# Patient Record
Sex: Female | Born: 1950 | Race: Black or African American | Hispanic: No | Marital: Single | Smoking: Former smoker
Health system: Southern US, Community
[De-identification: ages and names within clinical notes are randomized; demographics above are authoritative.]

## PROBLEM LIST (undated history)

## (undated) DIAGNOSIS — H35039 Hypertensive retinopathy, unspecified eye: Secondary | ICD-10-CM

## (undated) DIAGNOSIS — I10 Essential (primary) hypertension: Secondary | ICD-10-CM

## (undated) DIAGNOSIS — F1291 Cannabis use, unspecified, in remission: Secondary | ICD-10-CM

## (undated) DIAGNOSIS — I5189 Other ill-defined heart diseases: Secondary | ICD-10-CM

## (undated) DIAGNOSIS — M1990 Unspecified osteoarthritis, unspecified site: Secondary | ICD-10-CM

## (undated) DIAGNOSIS — K760 Fatty (change of) liver, not elsewhere classified: Secondary | ICD-10-CM

## (undated) DIAGNOSIS — I6523 Occlusion and stenosis of bilateral carotid arteries: Secondary | ICD-10-CM

## (undated) DIAGNOSIS — I2721 Secondary pulmonary arterial hypertension: Secondary | ICD-10-CM

## (undated) DIAGNOSIS — N200 Calculus of kidney: Secondary | ICD-10-CM

## (undated) DIAGNOSIS — I38 Endocarditis, valve unspecified: Secondary | ICD-10-CM

## (undated) DIAGNOSIS — K8020 Calculus of gallbladder without cholecystitis without obstruction: Secondary | ICD-10-CM

## (undated) DIAGNOSIS — H8120 Vestibular neuronitis, unspecified ear: Secondary | ICD-10-CM

## (undated) DIAGNOSIS — Z7901 Long term (current) use of anticoagulants: Secondary | ICD-10-CM

## (undated) DIAGNOSIS — N3289 Other specified disorders of bladder: Secondary | ICD-10-CM

## (undated) DIAGNOSIS — D509 Iron deficiency anemia, unspecified: Secondary | ICD-10-CM

## (undated) DIAGNOSIS — K759 Inflammatory liver disease, unspecified: Secondary | ICD-10-CM

## (undated) DIAGNOSIS — K8050 Calculus of bile duct without cholangitis or cholecystitis without obstruction: Secondary | ICD-10-CM

## (undated) DIAGNOSIS — I214 Non-ST elevation (NSTEMI) myocardial infarction: Secondary | ICD-10-CM

## (undated) DIAGNOSIS — K7460 Unspecified cirrhosis of liver: Secondary | ICD-10-CM

## (undated) DIAGNOSIS — I700 Atherosclerosis of aorta: Secondary | ICD-10-CM

## (undated) DIAGNOSIS — I050 Rheumatic mitral stenosis: Secondary | ICD-10-CM

## (undated) DIAGNOSIS — I4891 Unspecified atrial fibrillation: Secondary | ICD-10-CM

## (undated) DIAGNOSIS — D259 Leiomyoma of uterus, unspecified: Secondary | ICD-10-CM

## (undated) HISTORY — PX: BREAST CYST ASPIRATION: SHX578

---
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
## 2008-11-26 ENCOUNTER — Ambulatory Visit: Payer: Self-pay

## 2009-01-28 ENCOUNTER — Ambulatory Visit: Payer: Self-pay

## 2009-10-13 ENCOUNTER — Encounter: Payer: Self-pay | Admitting: Family Medicine

## 2009-11-07 ENCOUNTER — Encounter: Payer: Self-pay | Admitting: Family Medicine

## 2010-04-14 ENCOUNTER — Ambulatory Visit: Payer: Self-pay | Admitting: Family Medicine

## 2010-06-09 ENCOUNTER — Ambulatory Visit: Payer: Self-pay | Admitting: Emergency Medicine

## 2010-08-09 HISTORY — PX: COLONOSCOPY: SHX174

## 2010-08-27 ENCOUNTER — Emergency Department: Payer: Self-pay | Admitting: Nurse Practitioner

## 2010-09-11 DIAGNOSIS — H4010X0 Unspecified open-angle glaucoma, stage unspecified: Secondary | ICD-10-CM | POA: Insufficient documentation

## 2010-09-11 HISTORY — DX: Unspecified open-angle glaucoma, stage unspecified: H40.10X0

## 2015-07-30 ENCOUNTER — Other Ambulatory Visit: Payer: Self-pay | Admitting: Family Medicine

## 2015-07-30 DIAGNOSIS — Z1239 Encounter for other screening for malignant neoplasm of breast: Secondary | ICD-10-CM

## 2015-09-10 ENCOUNTER — Other Ambulatory Visit: Payer: Self-pay | Admitting: Family Medicine

## 2015-09-10 ENCOUNTER — Ambulatory Visit
Admission: RE | Admit: 2015-09-10 | Discharge: 2015-09-10 | Disposition: A | Discharge status: Home / Self Care | Payer: Medicare Other | Source: Ambulatory Visit | Attending: Family Medicine | Admitting: Family Medicine

## 2015-09-10 DIAGNOSIS — Z1231 Encounter for screening mammogram for malignant neoplasm of breast: Secondary | ICD-10-CM

## 2015-09-10 DIAGNOSIS — Z1239 Encounter for other screening for malignant neoplasm of breast: Secondary | ICD-10-CM

## 2016-07-26 ENCOUNTER — Encounter: Payer: Self-pay | Admitting: Emergency Medicine

## 2016-07-26 ENCOUNTER — Inpatient Hospital Stay
Admission: EM | Admit: 2016-07-26 | Discharge: 2016-07-27 | DRG: 281 | Disposition: A | Discharge status: Home / Self Care | Payer: Medicare Other | Attending: Internal Medicine | Admitting: Internal Medicine

## 2016-07-26 ENCOUNTER — Emergency Department: Payer: Medicare Other

## 2016-07-26 DIAGNOSIS — Z87891 Personal history of nicotine dependence: Secondary | ICD-10-CM | POA: Diagnosis not present

## 2016-07-26 DIAGNOSIS — Z6841 Body Mass Index (BMI) 40.0 and over, adult: Secondary | ICD-10-CM | POA: Diagnosis not present

## 2016-07-26 DIAGNOSIS — E669 Obesity, unspecified: Secondary | ICD-10-CM | POA: Diagnosis present

## 2016-07-26 DIAGNOSIS — I25110 Atherosclerotic heart disease of native coronary artery with unstable angina pectoris: Secondary | ICD-10-CM | POA: Diagnosis not present

## 2016-07-26 DIAGNOSIS — I214 Non-ST elevation (NSTEMI) myocardial infarction: Secondary | ICD-10-CM | POA: Diagnosis not present

## 2016-07-26 DIAGNOSIS — R7989 Other specified abnormal findings of blood chemistry: Secondary | ICD-10-CM

## 2016-07-26 DIAGNOSIS — Z79899 Other long term (current) drug therapy: Secondary | ICD-10-CM

## 2016-07-26 DIAGNOSIS — R079 Chest pain, unspecified: Secondary | ICD-10-CM

## 2016-07-26 DIAGNOSIS — I10 Essential (primary) hypertension: Secondary | ICD-10-CM | POA: Diagnosis present

## 2016-07-26 DIAGNOSIS — I200 Unstable angina: Secondary | ICD-10-CM | POA: Diagnosis present

## 2016-07-26 DIAGNOSIS — R778 Other specified abnormalities of plasma proteins: Secondary | ICD-10-CM

## 2016-07-26 HISTORY — DX: Non-ST elevation (NSTEMI) myocardial infarction: I21.4

## 2016-07-26 HISTORY — DX: Essential (primary) hypertension: I10

## 2016-07-26 LAB — COMPREHENSIVE METABOLIC PANEL
ALT: 38 U/L (ref 14–54)
ANION GAP: 6 (ref 5–15)
AST: 61 U/L — AB (ref 15–41)
Albumin: 2.8 g/dL — ABNORMAL LOW (ref 3.5–5.0)
Alkaline Phosphatase: 72 U/L (ref 38–126)
BUN: 9 mg/dL (ref 6–20)
CHLORIDE: 108 mmol/L (ref 101–111)
CO2: 24 mmol/L (ref 22–32)
Calcium: 8.6 mg/dL — ABNORMAL LOW (ref 8.9–10.3)
Creatinine, Ser: 0.67 mg/dL (ref 0.44–1.00)
Glucose, Bld: 110 mg/dL — ABNORMAL HIGH (ref 65–99)
POTASSIUM: 3.5 mmol/L (ref 3.5–5.1)
Sodium: 138 mmol/L (ref 135–145)
TOTAL PROTEIN: 7.2 g/dL (ref 6.5–8.1)
Total Bilirubin: 0.9 mg/dL (ref 0.3–1.2)

## 2016-07-26 LAB — CBC
HEMATOCRIT: 37.1 % (ref 35.0–47.0)
HEMOGLOBIN: 13.3 g/dL (ref 12.0–16.0)
MCH: 35.8 pg — ABNORMAL HIGH (ref 26.0–34.0)
MCHC: 35.9 g/dL (ref 32.0–36.0)
MCV: 99.7 fL (ref 80.0–100.0)
Platelets: 194 10*3/uL (ref 150–440)
RBC: 3.72 MIL/uL — ABNORMAL LOW (ref 3.80–5.20)
RDW: 13.4 % (ref 11.5–14.5)
WBC: 3.7 10*3/uL (ref 3.6–11.0)

## 2016-07-26 LAB — PROTIME-INR
INR: 1.28
PROTHROMBIN TIME: 16.1 s — AB (ref 11.4–15.2)

## 2016-07-26 LAB — HEPARIN LEVEL (UNFRACTIONATED): HEPARIN UNFRACTIONATED: 0.21 [IU]/mL — AB (ref 0.30–0.70)

## 2016-07-26 LAB — TROPONIN I: TROPONIN I: 0.06 ng/mL — AB (ref <0.03)

## 2016-07-26 LAB — APTT: APTT: 40 s — AB (ref 24–36)

## 2016-07-26 MED ORDER — CLOPIDOGREL BISULFATE 75 MG PO TABS
75.0000 mg | ORAL_TABLET | Freq: Every day | ORAL | Status: DC
  Administered 2016-07-27: 75 mg via ORAL
  Filled 2016-07-26: qty 1

## 2016-07-26 MED ORDER — SODIUM CHLORIDE 0.9 % IV SOLN
INTRAVENOUS | Status: DC
  Administered 2016-07-26 – 2016-07-27 (×2): via INTRAVENOUS

## 2016-07-26 MED ORDER — AMLODIPINE BESYLATE 5 MG PO TABS
10.0000 mg | ORAL_TABLET | Freq: Every day | ORAL | Status: DC
  Administered 2016-07-26 – 2016-07-27 (×2): 10 mg via ORAL
  Filled 2016-07-26 (×2): qty 2

## 2016-07-26 MED ORDER — HYDRALAZINE HCL 25 MG PO TABS
25.0000 mg | ORAL_TABLET | Freq: Three times a day (TID) | ORAL | Status: DC
  Administered 2016-07-26 – 2016-07-27 (×3): 25 mg via ORAL
  Filled 2016-07-26 (×3): qty 1

## 2016-07-26 MED ORDER — HEPARIN SODIUM (PORCINE) 5000 UNIT/ML IJ SOLN: 4000.0000 [IU] | Freq: Once | INTRAMUSCULAR | Status: DC

## 2016-07-26 MED ORDER — ASPIRIN 81 MG PO CHEW
324.0000 mg | CHEWABLE_TABLET | Freq: Once | ORAL | Status: AC
  Administered 2016-07-26: 324 mg via ORAL
  Filled 2016-07-26: qty 4

## 2016-07-26 MED ORDER — CLOPIDOGREL BISULFATE 75 MG PO TABS
300.0000 mg | ORAL_TABLET | Freq: Once | ORAL | Status: AC
  Administered 2016-07-26: 300 mg via ORAL
  Filled 2016-07-26: qty 4

## 2016-07-26 MED ORDER — ASPIRIN EC 81 MG PO TBEC
81.0000 mg | DELAYED_RELEASE_TABLET | Freq: Every day | ORAL | Status: DC
  Administered 2016-07-27: 81 mg via ORAL
  Filled 2016-07-26: qty 1

## 2016-07-26 MED ORDER — HEPARIN BOLUS VIA INFUSION
1000.0000 [IU] | Freq: Once | INTRAVENOUS | Status: AC
  Administered 2016-07-26: 1000 [IU] via INTRAVENOUS
  Filled 2016-07-26: qty 1000

## 2016-07-26 MED ORDER — HYDROCHLOROTHIAZIDE 25 MG PO TABS
12.5000 mg | ORAL_TABLET | Freq: Every day | ORAL | Status: DC
  Administered 2016-07-26 – 2016-07-27 (×2): 12.5 mg via ORAL
  Filled 2016-07-26 (×2): qty 1

## 2016-07-26 MED ORDER — HEPARIN (PORCINE) IN NACL 100-0.45 UNIT/ML-% IJ SOLN
950.0000 [IU]/h | INTRAMUSCULAR | Status: DC
  Administered 2016-07-26: 800 [IU]/h via INTRAVENOUS
  Filled 2016-07-26 (×2): qty 250

## 2016-07-26 MED ORDER — NITROGLYCERIN 0.4 MG SL SUBL: 0.4000 mg | SUBLINGUAL_TABLET | SUBLINGUAL | Status: DC | PRN

## 2016-07-26 MED ORDER — ACETAMINOPHEN 325 MG PO TABS: 650.0000 mg | ORAL_TABLET | ORAL | Status: DC | PRN

## 2016-07-26 MED ORDER — METOPROLOL TARTRATE 25 MG PO TABS
25.0000 mg | ORAL_TABLET | Freq: Two times a day (BID) | ORAL | Status: DC
  Administered 2016-07-26 – 2016-07-27 (×3): 25 mg via ORAL
  Filled 2016-07-26 (×3): qty 1

## 2016-07-26 MED ORDER — HEPARIN BOLUS VIA INFUSION
3950.0000 [IU] | Freq: Once | INTRAVENOUS | Status: AC
  Administered 2016-07-26: 3950 [IU] via INTRAVENOUS
  Filled 2016-07-26: qty 3950

## 2016-07-26 MED ORDER — ATORVASTATIN CALCIUM 20 MG PO TABS
40.0000 mg | ORAL_TABLET | Freq: Every day | ORAL | Status: DC
  Administered 2016-07-26: 40 mg via ORAL
  Filled 2016-07-26: qty 2

## 2016-07-26 MED ORDER — HYDRALAZINE HCL 20 MG/ML IJ SOLN: 10.0000 mg | Freq: Four times a day (QID) | INTRAMUSCULAR | Status: DC | PRN

## 2016-07-26 MED ORDER — ONDANSETRON HCL 4 MG/2ML IJ SOLN: 4.0000 mg | Freq: Four times a day (QID) | INTRAMUSCULAR | Status: DC | PRN

## 2016-07-26 NOTE — Consult Note (Addendum)
ANTICOAGULATION CONSULT NOTE - Initial Consult  Pharmacy Consult for heparin drip Indication: chest pain/ACS  No Known Allergies  Patient Measurements: Height: 4\' 11"  (149.9 cm) Weight: 200 lb 9.6 oz (91 kg) IBW/kg (Calculated) : 43.2 Heparin Dosing Weight: 65.7kg  Vital Signs: Temp: 98.2 F (36.8 C) (12/18 1932) Temp Source: Oral (12/18 1932) BP: 128/47 (12/18 1932) Pulse Rate: 54 (12/18 1932)  Labs:  Recent Labs  07/26/16 1042 07/26/16 1044 07/26/16 2155  HGB 13.3  --   --   HCT 37.1  --   --   PLT 194  --   --   APTT  --  40*  --   LABPROT  --  16.1*  --   INR  --  1.28  --   HEPARINUNFRC  --   --  0.21*  CREATININE 0.67  --   --   TROPONINI 0.06*  --   --     Estimated Creatinine Clearance: 69 mL/min (by C-G formula based on SCr of 0.67 mg/dL).   Medical History: Past Medical History:  Diagnosis Date  . Hypertension     Medications:  Scheduled:  . amLODipine  10 mg Oral Daily  . [START ON 07/27/2016] aspirin EC  81 mg Oral Daily  . atorvastatin  40 mg Oral q1800  . [START ON 07/27/2016] clopidogrel  75 mg Oral Daily  . hydrALAZINE  25 mg Oral Q8H  . hydrochlorothiazide  12.5 mg Oral Daily  . metoprolol tartrate  25 mg Oral BID    Assessment: Pt is a 65 year old female who presents with chest pain and mildly elevated troponin. Pharmacy consulted to dose heparin drip. Pt does not take anticoagulants at home. Baseline labs ordered/  Goal of Therapy:  Heparin level 0.3-0.7 units/ml Monitor platelets by anticoagulation protocol: Yes   Plan:  Give 3950 units bolus x 1 Start heparin infusion at 800 units/hr Check anti-Xa level in 6 hours and daily while on heparin Continue to monitor H&H and platelets  12/18 2155 HL subtherapeutic. 1000 units IV x 1 bolus and increase rate to 950 units/hr. Will recheck HL in 6 hours.  12/18 0556 HL therapeutic x 1. Continue current rate. Will recheck HL in 6 hours.  Kevonna Nolte A. [place], [place].D, BCPS Clinical  Pharmacist  07/26/2016,11:27 PM

## 2016-07-26 NOTE — ED Triage Notes (Signed)
Intermittent chest pain x 2 months, states this am began about 0830 and worse than usual and longer period.

## 2016-07-26 NOTE — H&P (Signed)
[facility] [place] NAME: Kristina Ellis    MR#:  [id number]  DATE OF BIRTH:  07-Dec-1950  DATE OF ADMISSION:  07/26/2016  PRIMARY CARE PHYSICIAN: [place]   REQUESTING/REFERRING PHYSICIAN: Hinda Kehr, MD  CHIEF COMPLAINT:   Chief Complaint  Patient presents with  . Chest Pain   Chest pain since this morning. HISTORY OF PRESENT ILLNESS:  Kristina Ellis  is a 65 y.o. female with a known history of Hypertension. The patient presents in the ED with chest pain this morning. The chest pain is in substernal area, intermittent, aching without radiation. She also complains of nausea and diaphoresis this morning. She still has some mild chest pain now. Her troponin is elevated at 0.06. ED physician gave aspirin and started heparin drip.  PAST MEDICAL HISTORY:   Past Medical History:  Diagnosis Date  . Hypertension     PAST SURGICAL HISTORY:   Past Surgical History:  Procedure Laterality Date  . BREAST CYST ASPIRATION Left     SOCIAL HISTORY:   Social History  Substance Use Topics  . Smoking status: Former Research scientist (life sciences)  . Smokeless tobacco: Not on file  . Alcohol use Not on file    FAMILY HISTORY:   Family History  Problem Relation Age of Onset  . Aneurysm Mother   . Colon cancer Father   . Lung cancer Brother     DRUG ALLERGIES:  No Known Allergies  REVIEW OF SYSTEMS:   Review of Systems  Constitutional: Positive for diaphoresis. Negative for chills, fever and malaise/fatigue.  HENT: Negative for congestion.   Eyes: Negative for blurred vision and double vision.  Respiratory: Negative for cough, shortness of breath and stridor.   Cardiovascular: Positive for chest pain. Negative for palpitations, orthopnea and leg swelling.  Gastrointestinal: Positive for nausea. Negative for abdominal pain, blood in stool, diarrhea, melena and vomiting.  Genitourinary: Negative for dysuria and hematuria.    Musculoskeletal: Negative for joint pain.  Skin: Negative for itching and rash.  Neurological: Negative for dizziness, focal weakness, loss of consciousness and weakness.  Psychiatric/Behavioral: Negative for depression. The patient is not nervous/anxious.     MEDICATIONS AT HOME:   Prior to Admission medications   Medication Sig Start Date End Date Taking? Authorizing Provider  ibuprofen (ADVIL,MOTRIN) 800 MG tablet Take 800 mg by mouth every 8 (eight) hours as needed.   Yes Historical Provider, MD  amLODipine (NORVASC) 10 MG tablet Take 10 mg by mouth daily.    Historical Provider, MD  hydrochlorothiazide (HYDRODIURIL) 12.5 MG tablet Take 1 tablet by mouth daily. 09/10/10   Historical Provider, MD      VITAL SIGNS:  Blood pressure (!) 172/65, pulse (!) 58, temperature 97.8 F (36.6 C), temperature source Oral, resp. rate 19, height 4\' 11"  (1.499 m), weight 205 lb (93 kg), SpO2 100 %.  PHYSICAL EXAMINATION:  Physical Exam  GENERAL:  65 y.o.-year-old patient lying in the bed with no acute distress. Obese. EYES: Pupils equal, round, reactive to light and accommodation. No scleral icterus. Extraocular muscles intact.  HEENT: Head atraumatic, normocephalic. Oropharynx and nasopharynx clear.  NECK:  Supple, no jugular venous distention. No thyroid enlargement, no tenderness.  LUNGS: Normal breath sounds bilaterally, no wheezing, rales,rhonchi or crepitation. No use of accessory muscles of respiration.  CARDIOVASCULAR: S1, S2 normal. No murmurs, rubs, or gallops.  ABDOMEN: Soft, nontender, nondistended. Bowel sounds present. No organomegaly or mass.  EXTREMITIES: No pedal edema, cyanosis, or clubbing.  NEUROLOGIC: Cranial nerves II through XII are intact. Muscle strength 5/5 in all extremities. Sensation intact. Gait not checked.  PSYCHIATRIC: The patient is alert and oriented x 3.  SKIN: No obvious rash, lesion, or ulcer.   LABORATORY PANEL:   CBC  Recent Labs Lab 07/26/16 1042   WBC 3.7  HGB 13.3  HCT 37.1  PLT 194   ------------------------------------------------------------------------------------------------------------------  Chemistries   Recent Labs Lab 07/26/16 1042  NA 138  K 3.5  CL 108  CO2 24  GLUCOSE 110*  BUN 9  CREATININE 0.67  CALCIUM 8.6*  AST 61*  ALT 38  ALKPHOS 72  BILITOT 0.9   ------------------------------------------------------------------------------------------------------------------  Cardiac Enzymes  Recent Labs Lab 07/26/16 1042  TROPONINI 0.06*   ------------------------------------------------------------------------------------------------------------------  RADIOLOGY:  Dg Chest 2 View  Result Date: 07/26/2016 CLINICAL DATA:  Chest pain this morning which move from a right chest to the left chest and has worsened. EXAM: CHEST  2 VIEW COMPARISON:  None. FINDINGS: The lungs are clear wiithout focal pneumonia, edema, pneumothorax or pleural effusion. The cardiopericardial silhouette is within normal limits for size. Advanced degenerative changes noted right shoulder. IMPRESSION: 1. No acute cardiopulmonary findings. 2. Advanced degenerative changes right shoulder. Electronically Signed   By: Misty Stanley M.D.   On: 07/26/2016 11:16      IMPRESSION AND PLAN:   NSTEMI. The patient will be admitted to medical floor with telemetry monitor. Follow-up troponin level, lipid panel and cardiology consult. Started heparin drip and given aspirin, add Lipitor and Lopressor.  Accelerated hypertension. Continue Norvasc and HCTZ, 8 Lopressor, IV hydralazine when necessary.  Tobacco abuse. Smoking cessation was counseled for 3 minutes, given nicotine patch.  Obesity. Check lipid panel. All the records are reviewed and case discussed with ED provider. Management plans discussed with the patient, family and they are in agreement.  CODE STATUS: Full code  TOTAL TIME TAKING CARE OF THIS PATIENT: 53 minutes.     Demetrios Loll M.D on 07/26/2016 at 2:35 PM  Between 7am to 6pm - Pager - [phone]  After 6pm go to [email or web address] - Proofreader  [facility]  Office  [phone]  CC: Primary care physician; [place]   Note: This dictation was prepared with Dragon dictation along with smaller phrase technology. Any transcriptional errors that result from this process are unintentional.

## 2016-07-26 NOTE — ED Provider Notes (Signed)
[facility] Emergency Department Provider Note  ____________________________________________   First MD Initiated Contact with Patient 07/26/16 1257     (approximate)  I have reviewed the triage vital signs and the nursing notes.   HISTORY  Chief Complaint Chest Pain    HPI Kristina Ellis is a 65 y.o. female who presents for evaluation of chest pain.  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  She reports that she had acute onset of severe central and left-sided chest pain that she describes as sharp and pressure-like this morning.  She states that sometimes she has episodes that are similar to this but much more mild and very brief.  Today was worse and lasted longer.  She states it is better now but she still feels "a tingling".  She said that there was some shortness of breath associated with the chest pain.  She denies fever/chills, nausea, vomiting, abdominal pain, dysuria.  She believes that she saw a cardiologist but it was many years ago.  She does not regularly go to any doctors including primary care nor cardiology.  She is not on a blood thinner including aspirin.   Past Medical History:  Diagnosis Date  . Hypertension     There are no active problems to display for this patient.   Past Surgical History:  Procedure Laterality Date  . BREAST CYST ASPIRATION Left     Prior to Admission medications   Not on File    Allergies Patient has no known allergies.  No family history on file.  Social History Social History  Substance Use Topics  . Smoking status: Former Research scientist (life sciences)  . Smokeless tobacco: Not on file  . Alcohol use Not on file    Review of Systems Constitutional: No fever/chills Eyes: No visual changes. ENT: No sore throat. Cardiovascular: +chest pain. Respiratory: +shortness of breath with the  chest pain Gastrointestinal: No abdominal pain.  No nausea, no vomiting.  No diarrhea.  No constipation. Genitourinary: Negative for dysuria. Musculoskeletal: Negative for back pain. Skin: Negative for rash. Neurological: Negative for headaches, focal weakness or numbness.  10-point ROS otherwise negative.  ____________________________________________   PHYSICAL EXAM:  VITAL SIGNS: ED Triage Vitals  Enc Vitals Group     BP 07/26/16 1039 (!) 173/76     Pulse Rate 07/26/16 1039 87     Resp 07/26/16 1039 20     Temp 07/26/16 1039 97.8 F (36.6 C)     Temp Source 07/26/16 1039 Oral     SpO2 07/26/16 1039 100 %     Weight 07/26/16 1039 205 lb (93 kg)     Height 07/26/16 1039 4\' 11"  (1.499 m)     Head Circumference --      Peak Flow --      Pain Score 07/26/16 1040 4     Pain Loc --      Pain Edu? --      Excl. in [place]? --     Constitutional: Alert and oriented. Well appearing and in no acute distress. Eyes: Conjunctivae are normal. PERRL. EOMI. Head: Atraumatic. Nose: No congestion/rhinnorhea. Mouth/Throat: Mucous membranes are moist.  Oropharynx non-erythematous. Neck: No stridor.  No meningeal signs.   Cardiovascular: Normal rate, regular rhythm. Good peripheral circulation. Grossly normal heart sounds. Respiratory: Normal respiratory effort.  No retractions. Lungs CTAB. Gastrointestinal: Soft and nontender.  No distention.  Musculoskeletal: No lower extremity tenderness nor edema. No gross deformities of extremities. Neurologic:  Normal speech and language. No gross focal neurologic deficits are appreciated.  Skin:  Skin is warm, dry and intact. No rash noted. Psychiatric: Mood and affect are normal. Speech and behavior are normal.  ____________________________________________   LABS (all labs ordered are listed, but only abnormal results are displayed)  Labs Reviewed  COMPREHENSIVE METABOLIC PANEL - Abnormal; Notable for the following:       Result Value   Glucose,  Bld 110 (*)    Calcium 8.6 (*)    Albumin 2.8 (*)    AST 61 (*)    All other components within normal limits  TROPONIN I - Abnormal; Notable for the following:    Troponin I 0.06 (*)    All other components within normal limits  CBC - Abnormal; Notable for the following:    RBC 3.72 (*)    MCH 35.8 (*)    All other components within normal limits  PROTIME-INR  APTT   ____________________________________________  EKG  ED ECG REPORT I, Kynli Chou, the attending physician, personally viewed and interpreted this ECG.  Date: 07/26/2016 EKG Time: 10:37 Rate: 84 Rhythm: normal sinus rhythm QRS Axis: left axis deviation Intervals: normal ST/T Wave abnormalities: peaked T waves particularly noticeable in leads V2 and V3. no ST depression nor elevation Conduction Disturbances: none Narrative Interpretation: no evidence of acute ischemia  ____________________________________________  RADIOLOGY   Dg Chest 2 View  Result Date: 07/26/2016 CLINICAL DATA:  Chest pain this morning which move from a right chest to the left chest and has worsened. EXAM: CHEST  2 VIEW COMPARISON:  None. FINDINGS: The lungs are clear wiithout focal pneumonia, edema, pneumothorax or pleural effusion. The cardiopericardial silhouette is within normal limits for size. Advanced degenerative changes noted right shoulder. IMPRESSION: 1. No acute cardiopulmonary findings. 2. Advanced degenerative changes right shoulder. Electronically Signed   By: Misty Stanley M.D.   On: 07/26/2016 11:16    ____________________________________________   PROCEDURES  Procedure(s) performed:   .Critical Care Performed by: Hinda Kehr Authorized by: Hinda Kehr   Critical care provider statement:    Critical care time (minutes):  30   Critical care time was exclusive of:  Separately billable procedures and treating other patients   Critical care was necessary to treat or prevent imminent or life-threatening  deterioration of the following conditions:  Cardiac failure   Critical care was time spent personally by me on the following activities:  Development of treatment plan with patient or surrogate, discussions with consultants, evaluation of patient's response to treatment, examination of patient, obtaining history from patient or surrogate, ordering and performing treatments and interventions, ordering and review of laboratory studies, ordering and review of radiographic studies, pulse oximetry, re-evaluation of patient's condition and review of old charts     Critical Care performed: Yes, see critical care procedure note(s) ____________________________________________   INITIAL IMPRESSION / [place] / ED COURSE  Pertinent labs & imaging results that were available during my care of the patient were reviewed by me and considered in my medical decision making (see chart for details).  The patient is at high risk for ACS/CAD with multiple risk factors, episodic chest pain that is now worse and at rest, and a slightly positive troponin of 0.06.  At a minimum this qualifies as unstable angina.  My opinion she needs admission and probable catheterization although of course I will be up to  cardiology evaluation.  I will give her a full dose aspirin, heparin, and I discussed the case with the hospitalist who agrees with the plan for care and admission.  ____________________________________________  FINAL CLINICAL IMPRESSION(S) / ED DIAGNOSES  Final diagnoses:  Chest pain, unspecified type  Elevated troponin I level  Unstable angina (HCC)     MEDICATIONS GIVEN DURING THIS VISIT:  Medications  heparin injection 4,000 Units (not administered)  aspirin chewable tablet 324 mg (324 mg Oral Given 07/26/16 1325)     NEW OUTPATIENT MEDICATIONS STARTED DURING THIS VISIT:  New Prescriptions   No medications on file    Modified Medications   No medications on file    Discontinued  Medications   No medications on file     Note:  This document was prepared using Dragon voice recognition software and may include unintentional dictation errors.    Hinda Kehr, MD 07/26/16 [phone]

## 2016-07-26 NOTE — Progress Notes (Signed)
Kristina Ellis is Ellis 65 y.o. female  [id number]  Primary Cardiologist: Kristina Ellis Reason for Consultation: Chest pain  HPI: 48 YOBF came with pressure type of chest pain fro few hours intermittanly. No chest pain at this time.   Review of Systems:No palpitation, and no PND/Orthopnea   Past Medical History:  Diagnosis Date  . Hypertension     Medications Prior to Admission  Medication Sig Dispense Refill  . ibuprofen (ADVIL,MOTRIN) 800 MG tablet Take 800 mg by mouth every 8 (eight) hours as needed.    Marland Kitchen amLODipine (NORVASC) 10 MG tablet Take 10 mg by mouth daily.    . hydrochlorothiazide (HYDRODIURIL) 12.5 MG tablet Take 1 tablet by mouth daily.       Marland Kitchen amLODipine  10 mg Oral Daily  . [START ON 07/27/2016] aspirin EC  81 mg Oral Daily  . atorvastatin  40 mg Oral q1800  . hydrochlorothiazide  12.5 mg Oral Daily  . metoprolol tartrate  25 mg Oral BID    Infusions: . sodium chloride    . heparin 800 Units/hr (07/26/16 1526)    No Known Allergies  Social History   Social History  . Marital status: Single    Spouse name: N/Ellis  . Number of children: N/Ellis  . Years of education: N/Ellis   Occupational History  . Not on file.   Social History Main Topics  . Smoking status: Current Some Day Smoker    Packs/day: 0.10    Years: 10.00    Types: Cigarettes  . Smokeless tobacco: Never Used  . Alcohol use 2.4 oz/week    4 Cans of beer per week  . Drug use: Unknown     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, 60's and 70's  . Sexual activity: Not on file   Other Topics Concern  . Not on file   Social History Narrative  . No narrative on file    Family History  Problem Relation Age of Onset  . Aneurysm Mother   . Colon cancer Father   . Lung cancer Brother     PHYSICAL EXAM: Vitals:   07/26/16 1400 07/26/16 1504  BP: (!) 172/65 (!) 151/54  Pulse: (!) 58 (!) 58  Resp: 19 16  Temp:  98.5 F (36.9 C)    No intake or output data in the 24 hours ending 07/26/16  1648  General:  Well appearing. No respiratory difficulty HEENT: normal Neck: supple. no JVD. Carotids 2+ bilat; no bruits. No lymphadenopathy or thryomegaly appreciated. Cor: PMI nondisplaced. Regular rate & rhythm. No rubs, gallops or murmurs. Lungs: clear Abdomen: soft, nontender, nondistended. No hepatosplenomegaly. No bruits or masses. Good bowel sounds. Extremities: no cyanosis, clubbing, rash, edema Neuro: alert & oriented x 3, cranial nerves grossly intact. moves all 4 extremities w/o difficulty. Affect pleasant.  ECG: NSR early repolarization abnormalities and LVH  Results for orders placed or performed during the hospital encounter of 07/26/16 (from the past 24 hour(s))  Comprehensive metabolic panel     Status: Abnormal   Collection Time: 07/26/16 10:42 AM  Result Value Ref Range   Sodium 138 135 - 145 mmol/L   Potassium 3.5 3.5 - 5.1 mmol/L   Chloride 108 101 - 111 mmol/L   CO2 24 22 - 32 mmol/L   Glucose, Bld 110 (H) 65 - 99 mg/dL   BUN 9 6 - 20 mg/dL   Creatinine, Ser 0.67 0.44 - 1.00 mg/dL   Calcium 8.6 (L) 8.9 - 10.3 mg/dL  Total Protein 7.2 6.5 - 8.1 g/dL   Albumin 2.8 (L) 3.5 - 5.0 g/dL   AST 61 (H) 15 - 41 U/L   ALT 38 14 - 54 U/L   Alkaline Phosphatase 72 38 - 126 U/L   Total Bilirubin 0.9 0.3 - 1.2 mg/dL   GFR calc non Af Amer >60 >60 mL/min   GFR calc Af Amer >60 >60 mL/min   Anion gap 6 5 - 15  Troponin I     Status: Abnormal   Collection Time: 07/26/16 10:42 AM  Result Value Ref Range   Troponin I 0.06 (HH) <0.03 ng/mL  CBC     Status: Abnormal   Collection Time: 07/26/16 10:42 AM  Result Value Ref Range   WBC 3.7 3.6 - 11.0 K/uL   RBC 3.72 (L) 3.80 - 5.20 MIL/uL   Hemoglobin 13.3 12.0 - 16.0 g/dL   HCT 37.1 35.0 - 47.0 %   MCV 99.7 80.0 - 100.0 fL   MCH 35.8 (H) 26.0 - 34.0 pg   MCHC 35.9 32.0 - 36.0 g/dL   RDW 13.4 11.5 - 14.5 %   Platelets 194 150 - 440 K/uL  Protime-INR     Status: Abnormal   Collection Time: 07/26/16 10:44 AM  Result  Value Ref Range   Prothrombin Time 16.1 (H) 11.4 - 15.2 seconds   INR 1.28   APTT     Status: Abnormal   Collection Time: 07/26/16 10:44 AM  Result Value Ref Range   aPTT 40 (H) 24 - 36 seconds   Dg Chest 2 View  Result Date: 07/26/2016 CLINICAL DATA:  Chest pain this morning which move from Ellis right chest to the left chest and has worsened. EXAM: CHEST  2 VIEW COMPARISON:  None. FINDINGS: The lungs are clear wiithout focal pneumonia, edema, pneumothorax or pleural effusion. The cardiopericardial silhouette is within normal limits for size. Advanced degenerative changes noted right shoulder. IMPRESSION: 1. No acute cardiopulmonary findings. 2. Advanced degenerative changes right shoulder. Electronically Signed   By: Kristina Stanley M.D.   On: 07/26/2016 11:16     ASSESSMENT AND PLAN: Chest pain with mildly elevated troponin, probably demand ischeamia. Will do out patient w/u. Place on meds for CAD.  Kristina Ellis

## 2016-07-26 NOTE — Progress Notes (Signed)
Chaplain rounded the unit to provide a compassionate presence and support spiritual to the patient. Minerva Fester [phone]

## 2016-07-26 NOTE — Progress Notes (Signed)
Patient admitted via the ED for chest pain and pressure.  She had the pain intermittently over the last few months but today it was continous.  Oriented to unit routines and room features.  Explained to her that she must call whenever she gets up and that we will give her privacy in the bathroom. Bed alarm set.

## 2016-07-26 NOTE — Consult Note (Signed)
ANTICOAGULATION CONSULT NOTE - Initial Consult  Pharmacy Consult for heparin drip Indication: chest pain/ACS  No Known Allergies  Patient Measurements: Height: 4\' 11"  (149.9 cm) Weight: 205 lb (93 kg) IBW/kg (Calculated) : 43.2 Heparin Dosing Weight: 65.7kg  Vital Signs: Temp: 97.8 F (36.6 C) (12/18 1039) Temp Source: Oral (12/18 1039) BP: 157/95 (12/18 1300) Pulse Rate: 60 (12/18 1300)  Labs:  Recent Labs  07/26/16 1042  HGB 13.3  HCT 37.1  PLT 194  CREATININE 0.67  TROPONINI 0.06*    Estimated Creatinine Clearance: 69.8 mL/min (by C-G formula based on SCr of 0.67 mg/dL).   Medical History: Past Medical History:  Diagnosis Date  . Hypertension     Medications:  Scheduled:  . heparin  3,950 Units Intravenous Once    Assessment: Pt is a 65 year old female who presents with chest pain and mildly elevated troponin. Pharmacy consulted to dose heparin drip. Pt does not take anticoagulants at home. Baseline labs ordered/  Goal of Therapy:  Heparin level 0.3-0.7 units/ml Monitor platelets by anticoagulation protocol: Yes   Plan:  Give 3950 units bolus x 1 Start heparin infusion at 800 units/hr Check anti-Xa level in 6 hours and daily while on heparin Continue to monitor H&H and platelets  Melissa D Maccia, Pharm.D, BCPS Clinical Pharmacist  07/26/2016,1:45 PM

## 2016-07-27 DIAGNOSIS — I200 Unstable angina: Secondary | ICD-10-CM | POA: Diagnosis not present

## 2016-07-27 DIAGNOSIS — I214 Non-ST elevation (NSTEMI) myocardial infarction: Secondary | ICD-10-CM | POA: Diagnosis not present

## 2016-07-27 LAB — LIPID PANEL
CHOL/HDL RATIO: 2.3 ratio
CHOLESTEROL: 98 mg/dL (ref 0–200)
HDL: 42 mg/dL (ref >40)
LDL Cholesterol: 46 mg/dL (ref 0–99)
TRIGLYCERIDES: 50 mg/dL (ref <150)
VLDL: 10 mg/dL (ref 0–40)

## 2016-07-27 LAB — TROPONIN I: Troponin I: 0.33 ng/mL (ref <0.03)

## 2016-07-27 LAB — HEPARIN LEVEL (UNFRACTIONATED): Heparin Unfractionated: 0.34 IU/mL (ref 0.30–0.70)

## 2016-07-27 MED ORDER — FUROSEMIDE 20 MG PO TABS
20.0000 mg | ORAL_TABLET | Freq: Every day | ORAL | Status: DC
  Administered 2016-07-27: 20 mg via ORAL
  Filled 2016-07-27: qty 1

## 2016-07-27 MED ORDER — FUROSEMIDE 20 MG PO TABS: 20.0000 mg | ORAL_TABLET | Freq: Every day | ORAL | 0 refills | Status: DC

## 2016-07-27 MED ORDER — ACETAMINOPHEN 325 MG PO TABS: 650.0000 mg | ORAL_TABLET | Freq: Four times a day (QID) | ORAL | Status: DC | PRN

## 2016-07-27 MED ORDER — CLOPIDOGREL BISULFATE 75 MG PO TABS: 75.0000 mg | ORAL_TABLET | Freq: Every day | ORAL | 0 refills | Status: DC

## 2016-07-27 MED ORDER — NITROGLYCERIN 0.4 MG SL SUBL: 0.4000 mg | SUBLINGUAL_TABLET | SUBLINGUAL | 0 refills | Status: DC | PRN

## 2016-07-27 MED ORDER — METOPROLOL TARTRATE 25 MG PO TABS: 25.0000 mg | ORAL_TABLET | Freq: Two times a day (BID) | ORAL | 0 refills | Status: DC

## 2016-07-27 MED ORDER — ISOSORBIDE MONONITRATE ER 30 MG PO TB24: 30.0000 mg | ORAL_TABLET | Freq: Every day | ORAL | 0 refills | Status: DC

## 2016-07-27 MED ORDER — ISOSORBIDE MONONITRATE ER 30 MG PO TB24
30.0000 mg | ORAL_TABLET | Freq: Every day | ORAL | Status: DC
  Administered 2016-07-27: 30 mg via ORAL
  Filled 2016-07-27: qty 1

## 2016-07-27 MED ORDER — ASPIRIN 81 MG PO TBEC: 81.0000 mg | DELAYED_RELEASE_TABLET | Freq: Every day | ORAL | Status: DC

## 2016-07-27 MED ORDER — ATORVASTATIN CALCIUM 40 MG PO TABS
20.0000 mg | ORAL_TABLET | Freq: Every day | ORAL | 0 refills | Status: DC
Start: 2016-07-27 — End: 2021-05-05

## 2016-07-27 NOTE — Discharge Instructions (Signed)
Follow-up with primary care physician in a week Follow-up with cardiology Dr. Humphrey Rolls on 07/29/2016 at 9 AM

## 2016-07-27 NOTE — Progress Notes (Signed)
SUBJECTIVE: Patient is feeling much better less short and no further chest pain   Vitals:   07/26/16 1400 07/26/16 1504 07/26/16 1932 07/27/16 0346  BP: (!) 172/65 (!) 151/54 (!) 128/47 (!) 156/58  Pulse: (!) 58 (!) 58 (!) 54 (!) 54  Resp: 19 16 18 18   Temp:  98.5 F (36.9 C) 98.2 F (36.8 C) 98.1 F (36.7 C)  TempSrc:  Oral Oral Oral  SpO2: 100% 100% 99% 98%  Weight:  200 lb 9.6 oz (91 kg)    Height:  4\' 11"  (1.499 m)      Intake/Output Summary (Last 24 hours) at 07/27/16 0828 Last data filed at 07/27/16 0559  Gross per 24 hour  Intake          1087.53 ml  Output              350 ml  Net           737.53 ml    LABS: Basic Metabolic Panel:  Recent Labs  07/26/16 1042  NA 138  K 3.5  CL 108  CO2 24  GLUCOSE 110*  BUN 9  CREATININE 0.67  CALCIUM 8.6*   Liver Function Tests:  Recent Labs  07/26/16 1042  AST 61*  ALT 38  ALKPHOS 72  BILITOT 0.9  PROT 7.2  ALBUMIN 2.8*   No results for input(s): LIPASE, AMYLASE in the last 72 hours. CBC:  Recent Labs  07/26/16 1042  WBC 3.7  HGB 13.3  HCT 37.1  MCV 99.7  PLT 194   Cardiac Enzymes:  Recent Labs  07/26/16 1042 07/27/16 0556  TROPONINI 0.06* 0.33*   BNP: Invalid input(s): POCBNP D-Dimer: No results for input(s): DDIMER in the last 72 hours. Hemoglobin A1C: No results for input(s): HGBA1C in the last 72 hours. Fasting Lipid Panel:  Recent Labs  07/27/16 0556  CHOL 98  HDL 42  LDLCALC 46  TRIG 50  CHOLHDL 2.3   Thyroid Function Tests: No results for input(s): TSH, T4TOTAL, T3FREE, THYROIDAB in the last 72 hours.  Invalid input(s): FREET3 Anemia Panel: No results for input(s): VITAMINB12, FOLATE, FERRITIN, TIBC, IRON, RETICCTPCT in the last 72 hours.   PHYSICAL EXAM General: Well developed, well nourished, in no acute distress HEENT:  Normocephalic and atramatic Neck:  No JVD.  Lungs: Clear bilaterally to auscultation and percussion. Heart: HRRR . Normal S1 and S2 without  gallops or murmurs.  Abdomen: Bowel sounds are positive, abdomen soft and non-tender  Msk:  Back normal, normal gait. Normal strength and tone for age. Extremities: No clubbing, cyanosis or edema.   Neuro: Alert and oriented X 3. Psych:  Good affect, responds appropriately  TELEMETRY: Sinus rhythm 62 bpm  ASSESSMENT AND PLAN: Mildly elevated troponin with atypical chest pain probably has coronary artery disease and was patient was placed on medical therapy and has stabilized we will discharge the patient will do outpatient CTA coronaries. Patient will be seen in the office 9:30 this Thursday. And will do CTA coronaries. I have given my card to the patient.  Active Problems:   NSTEMI (non-ST elevated myocardial infarction) ([place])    Kristina Laming A, MD, [facility] 07/27/2016 8:28 AM

## 2016-07-27 NOTE — Discharge Summary (Signed)
Kristina Ellis at [place] NAME: Kristina Ellis    MR#:  [id number]  DATE OF BIRTH:  1951-02-19  DATE OF ADMISSION:  07/26/2016 ADMITTING PHYSICIAN: Demetrios Loll, MD  DATE OF DISCHARGE:07/27/16 PRIMARY CARE PHYSICIAN: [facility]    ADMISSION DIAGNOSIS:  Unstable angina ([place]) [I20.0] Elevated troponin I level [R74.8] Chest pain, unspecified type [R07.9]  DISCHARGE DIAGNOSIS:   nstemi SECONDARY DIAGNOSIS:   Past Medical History:  Diagnosis Date  . Hypertension     HOSPITAL COURSE:  Kristina Ellis  is a 65 y.o. female with a known history of Hypertension. The patient presents in the ED with chest pain this morning. The chest pain is in substernal area, intermittent, aching without radiation. She also complains of nausea and diaphoresis this morning. She still has some mild chest pain now. Her troponin is elevated at 0.06. ED physician gave aspirin and started heparin drip. Please review history and physical for details Hospital course  Chest pain secondary toNSTEMI Patient is chest pain-free during my examination Troponin 0.06-0.33 Patient is chest pain-free Discontinue heparin drip Cardiology Dr. Humphrey Rolls has recommended to continue aspirin, Lipitor, Lopressor,PLAVIX Started new medicines Imdur and Lasix Patient is to follow-up with cardiology as an outpatient on 12/21 at 9 AM CT coronary artery Appreciate cardiology recommendations Sublingual nitroglycerin as needed  Accelerated hypertension. Continue Norvasc and HCTZ,  Lopressor Added Imdur and Lasix to the regimen  Tobacco abuse. Smoking cessation was counseled for 3 minutes, given nicotine patch.  Obesity. Lifestyle changes suggested   Discharge patient home DISCHARGE CONDITIONS:  Stable  CONSULTS OBTAINED:  Treatment Team:  Dionisio David, MD   PROCEDURES none   DRUG ALLERGIES:  No Known Allergies  DISCHARGE MEDICATIONS:   Current Discharge  Medication List    START taking these medications   Details  acetaminophen (TYLENOL) 325 MG tablet Take 2 tablets (650 mg total) by mouth every 6 (six) hours as needed for mild pain or headache.    aspirin EC 81 MG EC tablet Take 1 tablet (81 mg total) by mouth daily.    atorvastatin (LIPITOR) 40 MG tablet Take 0.5 tablets (20 mg total) by mouth daily at 6 PM. Qty: 30 tablet, Refills: 0    clopidogrel (PLAVIX) 75 MG tablet Take 1 tablet (75 mg total) by mouth daily. Qty: 30 tablet, Refills: 0    furosemide (LASIX) 20 MG tablet Take 1 tablet (20 mg total) by mouth daily. Qty: 30 tablet, Refills: 0    isosorbide mononitrate (IMDUR) 30 MG 24 hr tablet Take 1 tablet (30 mg total) by mouth daily. Qty: 30 tablet, Refills: 0    metoprolol tartrate (LOPRESSOR) 25 MG tablet Take 1 tablet (25 mg total) by mouth 2 (two) times daily. Qty: 60 tablet, Refills: 0    nitroGLYCERIN (NITROSTAT) 0.4 MG SL tablet Place 1 tablet (0.4 mg total) under the tongue every 5 (five) minutes x 3 doses as needed for chest pain. Qty: 10 tablet, Refills: 0      CONTINUE these medications which have NOT CHANGED   Details  amLODipine (NORVASC) 10 MG tablet Take 10 mg by mouth daily.    hydrochlorothiazide (HYDRODIURIL) 12.5 MG tablet Take 1 tablet by mouth daily.      STOP taking these medications     ibuprofen (ADVIL,MOTRIN) 800 MG tablet          DISCHARGE INSTRUCTIONS:  Follow-up with primary care physician in a week Follow-up with cardiology Dr. Humphrey Rolls on  07/29/2016 at 9 AM    DIET:  Cardiac diet  DISCHARGE CONDITION:  Stable  ACTIVITY:  Activity as tolerated,No strenuous exercise until seen by cardiology on 07/29/2016  OXYGEN:  Home Oxygen: No.   Oxygen Delivery: room air  DISCHARGE LOCATION:  home   If you experience worsening of your admission symptoms, develop shortness of breath, life threatening emergency, suicidal or homicidal thoughts you must seek medical attention  immediately by calling 911 or calling your MD immediately  if symptoms less severe.  You Must read complete instructions/literature along with all the possible adverse reactions/side effects for all the Medicines you take and that have been prescribed to you. Take any new Medicines after you have completely understood and accpet all the possible adverse reactions/side effects.   Please note  You were cared for by a hospitalist during your hospital stay. If you have any questions about your discharge medications or the care you received while you were in the hospital after you are discharged, you can call the unit and asked to speak with the hospitalist on call if the hospitalist that took care of you is not available. Once you are discharged, your primary care physician will handle any further medical issues. Please note that NO REFILLS for any discharge medications will be authorized once you are discharged, as it is imperative that you return to your primary care physician (or establish a relationship with a primary care physician if you do not have one) for your aftercare needs so that they can reassess your need for medications and monitor your lab values.     Today  Chief Complaint  Patient presents with  . Chest Pain   Patient is chest pain-free during my examination. Seen and evaluated by cardiology and feels comfortable to be discharged home  ROS:  CONSTITUTIONAL: Denies fevers, chills. Denies any fatigue, weakness.  EYES: Denies blurry vision, double vision, eye pain. EARS, NOSE, THROAT: Denies tinnitus, ear pain, hearing loss. RESPIRATORY: Denies cough, wheeze, shortness of breath.  CARDIOVASCULAR: Denies chest pain, palpitations, edema.  GASTROINTESTINAL: Denies nausea, vomiting, diarrhea, abdominal pain. Denies bright red blood per rectum. GENITOURINARY: Denies dysuria, hematuria. ENDOCRINE: Denies nocturia or thyroid problems. HEMATOLOGIC AND LYMPHATIC: Denies easy bruising or  bleeding. SKIN: Denies rash or lesion. MUSCULOSKELETAL: Denies pain in neck, back, shoulder, knees, hips or arthritic symptoms.  NEUROLOGIC: Denies paralysis, paresthesias.  PSYCHIATRIC: Denies anxiety or depressive symptoms.   VITAL SIGNS:  Blood pressure (!) 156/58, pulse (!) 54, temperature 98.1 F (36.7 C), temperature source Oral, resp. rate 18, height 4\' 11"  (1.499 m), weight 91 kg (200 lb 9.6 oz), SpO2 98 %.  I/O:    Intake/Output Summary (Last 24 hours) at 07/27/16 0956 Last data filed at 07/27/16 0559  Gross per 24 hour  Intake          1087.53 ml  Output              350 ml  Net           737.53 ml    PHYSICAL EXAMINATION:  GENERAL:  65 y.o.-year-old patient lying in the bed with no acute distress.  EYES: Pupils equal, round, reactive to light and accommodation. No scleral icterus. Extraocular muscles intact.  HEENT: Head atraumatic, normocephalic. Oropharynx and nasopharynx clear.  NECK:  Supple, no jugular venous distention. No thyroid enlargement, no tenderness.  LUNGS: Normal breath sounds bilaterally, no wheezing, rales,rhonchi or crepitation. No use of accessory muscles of respiration.  CARDIOVASCULAR: S1, S2 normal.  No murmurs, rubs, or gallops.  ABDOMEN: Soft, non-tender, non-distended. Bowel sounds present. No organomegaly or mass.  EXTREMITIES: No pedal edema, cyanosis, or clubbing.  NEUROLOGIC: Cranial nerves II through XII are intact. Muscle strength 5/5 in all extremities. Sensation intact. Gait not checked.  PSYCHIATRIC: The patient is alert and oriented x 3.  SKIN: No obvious rash, lesion, or ulcer.   DATA REVIEW:   CBC  Recent Labs Lab 07/26/16 1042  WBC 3.7  HGB 13.3  HCT 37.1  PLT 194    Chemistries   Recent Labs Lab 07/26/16 1042  NA 138  K 3.5  CL 108  CO2 24  GLUCOSE 110*  BUN 9  CREATININE 0.67  CALCIUM 8.6*  AST 61*  ALT 38  ALKPHOS 72  BILITOT 0.9    Cardiac Enzymes  Recent Labs Lab 07/27/16 0556  TROPONINI  0.33*    Microbiology Results  No results found for this or any previous visit.  RADIOLOGY:  Dg Chest 2 View  Result Date: 07/26/2016 CLINICAL DATA:  Chest pain this morning which move from a right chest to the left chest and has worsened. EXAM: CHEST  2 VIEW COMPARISON:  None. FINDINGS: The lungs are clear wiithout focal pneumonia, edema, pneumothorax or pleural effusion. The cardiopericardial silhouette is within normal limits for size. Advanced degenerative changes noted right shoulder. IMPRESSION: 1. No acute cardiopulmonary findings. 2. Advanced degenerative changes right shoulder. Electronically Signed   By: Misty Stanley M.D.   On: 07/26/2016 11:16    EKG:   Orders placed or performed during the hospital encounter of 07/26/16  . EKG 12-Lead  . EKG 12-Lead  . ED EKG  . ED EKG      Management plans discussed with the patient, family and they are in agreement.  CODE STATUS:     Code Status Orders        Start     Ordered   07/26/16 1509  Full code  Continuous     07/26/16 1508    Code Status History    Date Active Date Inactive Code Status Order ID Comments User Context   This patient has a current code status but no historical code status.      TOTAL TIME TAKING CARE OF THIS PATIENT: 41 minutes.   Note: This dictation was prepared with Dragon dictation along with smaller phrase technology. Any transcriptional errors that result from this process are unintentional.   @[facility] @  on 07/27/2016 at 9:56 AM  Between 7am to 6pm - Pager - [phone]  After 6pm go to [email or web address] - password [email or web address] [facility]  [facility]  Office  [phone]  CC: Primary care physician; [place]

## 2016-07-27 NOTE — Care Management (Signed)
Canceled csw consult as it is not needed.  Consult for medication assistance.  spoke with patient and questioned her concern about paying for meds. "Look at it- I have all these prescriptions that are new.  How can I pay for all this.  patient says she did not sign up for medicare D coverage- then says she is not sure.  She is followed at [facility] and discussed a case worker could help her signup for part D coverage and discussed there are penalties if she waits to sign up.  Her discharge meds are on the [employer] four dollar list and provided coupon for plavix and nitrostat.  Patient says she can pay for her discharge meds.  Discussed checking with [facility] first to see if additional savings can be made.

## 2016-07-28 LAB — HEMOGLOBIN A1C
HEMOGLOBIN A1C: 4.8 % (ref 4.8–5.6)
MEAN PLASMA GLUCOSE: 91 mg/dL

## 2018-03-07 ENCOUNTER — Emergency Department: Payer: Medicare Other

## 2018-03-07 ENCOUNTER — Encounter: Payer: Self-pay | Admitting: Emergency Medicine

## 2018-03-07 ENCOUNTER — Other Ambulatory Visit: Payer: Self-pay

## 2018-03-07 ENCOUNTER — Emergency Department
Admission: EM | Admit: 2018-03-07 | Discharge: 2018-03-07 | Disposition: A | Discharge status: Home / Self Care | Payer: Medicare Other | Attending: Emergency Medicine | Admitting: Emergency Medicine

## 2018-03-07 DIAGNOSIS — K2900 Acute gastritis without bleeding: Secondary | ICD-10-CM | POA: Diagnosis not present

## 2018-03-07 DIAGNOSIS — F17210 Nicotine dependence, cigarettes, uncomplicated: Secondary | ICD-10-CM | POA: Diagnosis not present

## 2018-03-07 DIAGNOSIS — I10 Essential (primary) hypertension: Secondary | ICD-10-CM | POA: Diagnosis not present

## 2018-03-07 DIAGNOSIS — I252 Old myocardial infarction: Secondary | ICD-10-CM | POA: Diagnosis not present

## 2018-03-07 DIAGNOSIS — R112 Nausea with vomiting, unspecified: Secondary | ICD-10-CM | POA: Diagnosis present

## 2018-03-07 LAB — COMPREHENSIVE METABOLIC PANEL
ALBUMIN: 2.8 g/dL — AB (ref 3.5–5.0)
ALT: 34 U/L (ref 0–44)
ANION GAP: 7 (ref 5–15)
AST: 68 U/L — AB (ref 15–41)
Alkaline Phosphatase: 73 U/L (ref 38–126)
BILIRUBIN TOTAL: 1.2 mg/dL (ref 0.3–1.2)
BUN: 13 mg/dL (ref 8–23)
CALCIUM: 8.6 mg/dL — AB (ref 8.9–10.3)
CO2: 25 mmol/L (ref 22–32)
Chloride: 108 mmol/L (ref 98–111)
Creatinine, Ser: 0.7 mg/dL (ref 0.44–1.00)
GFR calc Af Amer: >60 mL/min (ref >60)
GLUCOSE: 96 mg/dL (ref 70–99)
POTASSIUM: 3.6 mmol/L (ref 3.5–5.1)
Sodium: 140 mmol/L (ref 135–145)
TOTAL PROTEIN: 7.7 g/dL (ref 6.5–8.1)

## 2018-03-07 LAB — CBC
HEMATOCRIT: 34.6 % — AB (ref 35.0–47.0)
Hemoglobin: 12.1 g/dL (ref 12.0–16.0)
MCH: 35.5 pg — ABNORMAL HIGH (ref 26.0–34.0)
MCHC: 34.8 g/dL (ref 32.0–36.0)
MCV: 101.8 fL — ABNORMAL HIGH (ref 80.0–100.0)
Platelets: 149 10*3/uL — ABNORMAL LOW (ref 150–440)
RBC: 3.4 MIL/uL — ABNORMAL LOW (ref 3.80–5.20)
RDW: 13.3 % (ref 11.5–14.5)
WBC: 3.6 10*3/uL (ref 3.6–11.0)

## 2018-03-07 LAB — LIPASE, BLOOD: Lipase: 38 U/L (ref 11–51)

## 2018-03-07 MED ORDER — ONDANSETRON 4 MG PO TBDP
4.0000 mg | ORAL_TABLET | Freq: Once | ORAL | Status: AC | PRN
  Administered 2018-03-07: 4 mg via ORAL
  Filled 2018-03-07: qty 1

## 2018-03-07 MED ORDER — ONDANSETRON HCL 4 MG/2ML IJ SOLN
4.0000 mg | Freq: Once | INTRAMUSCULAR | Status: AC
  Administered 2018-03-07: 4 mg via INTRAVENOUS
  Filled 2018-03-07: qty 2

## 2018-03-07 MED ORDER — FAMOTIDINE 20 MG PO TABS: 20.0000 mg | ORAL_TABLET | Freq: Two times a day (BID) | ORAL | 1 refills | Status: DC

## 2018-03-07 MED ORDER — FAMOTIDINE IN NACL 20-0.9 MG/50ML-% IV SOLN
20.0000 mg | Freq: Once | INTRAVENOUS | Status: AC
Start: 2018-03-07 — End: 2018-03-07
  Administered 2018-03-07: 20 mg via INTRAVENOUS
  Filled 2018-03-07: qty 50

## 2018-03-07 MED ORDER — ONDANSETRON 4 MG PO TBDP: 4.0000 mg | ORAL_TABLET | Freq: Three times a day (TID) | ORAL | 0 refills | Status: DC | PRN

## 2018-03-07 MED ORDER — SODIUM CHLORIDE 0.9 % IV SOLN
Freq: Once | INTRAVENOUS | Status: AC
  Administered 2018-03-07: 20:00:00 via INTRAVENOUS

## 2018-03-07 NOTE — ED Triage Notes (Signed)
Pt via pov from home with emesis x 2 days. Pt states she has vomited more than 20 times in 24 hours; has not eaten today. Pt alert & oriented with NAD noted.

## 2018-03-07 NOTE — ED Notes (Signed)
Pt seen ambulating out of tx area with steady gait by self. Per pt she has a ride home. ABCs intact. NAD.

## 2018-03-07 NOTE — ED Provider Notes (Signed)
[facility] Emergency Department Provider Note       Time seen: ----------------------------------------- 7:20 PM on 03/07/2018 -----------------------------------------   I have reviewed the triage vital signs and the nursing notes.  HISTORY   Chief Complaint Emesis    HPI Kristina Ellis is a 67 y.o. female with a history of hypertension and an STEMI who presents to the ED for vomiting for the past 2 days.  Patient states she is vomited more than 20 times in the past 24 hours.  She states she has not eaten today or had her blood pressure medications.  She also states she drinks beer daily and may have had several drinks 3 or 4 days ago prior to the symptom onset.  She denies fevers, chills or other complaints.  Past Medical History:  Diagnosis Date  . Hypertension     Patient Active Problem List   Diagnosis Date Noted  . NSTEMI (non-ST elevated myocardial infarction) ([place]) 07/26/2016    Past Surgical History:  Procedure Laterality Date  . BREAST CYST ASPIRATION Left     Allergies Patient has no known allergies.  Social History Social History   Tobacco Use  . Smoking status: Current Some Day Smoker    Packs/day: 0.10    Years: 10.00    Pack years: 1.00    Types: Cigarettes  . Smokeless tobacco: Never Used  . Tobacco comment: occasional smoker  Substance Use Topics  . Alcohol use: Yes    Alcohol/week: 3.0 oz    Types: 5 Cans of beer per week    Comment: weekly  . Drug use: Not Currently    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, 60's and 70's   Review of Systems Constitutional: Negative for fever. Cardiovascular: Negative for chest pain. Respiratory: Negative for shortness of breath. Gastrointestinal: Positive for abdominal pain, vomiting Genitourinary: Negative for dysuria. Musculoskeletal: Negative for back pain. Skin: Negative for rash. Neurological: Negative for headaches, focal weakness or numbness.  All systems  negative/normal/unremarkable except as stated in the HPI  ____________________________________________   PHYSICAL EXAM:  VITAL SIGNS: ED Triage Vitals  Enc Vitals Group     BP 03/07/18 1543 (!) 190/76     Pulse Rate 03/07/18 1543 (!) 56     Resp 03/07/18 1543 18     Temp 03/07/18 1543 98.6 F (37 C)     Temp Source 03/07/18 1543 Oral     SpO2 03/07/18 1543 100 %     Weight 03/07/18 1544 175 lb (79.4 kg)     Height 03/07/18 1544 4\' 11"  (1.499 m)     Head Circumference --      Peak Flow --      Pain Score 03/07/18 1543 0     Pain Loc --      Pain Edu? --      Excl. in [place]? --    Constitutional: Alert and oriented. Well appearing and in no distress. Eyes: Conjunctivae are normal. Normal extraocular movements. ENT   Head: Normocephalic and atraumatic.   Nose: No congestion/rhinnorhea.   Mouth/Throat: Mucous membranes are moist.   Neck: No stridor. Cardiovascular: Normal rate, regular rhythm. No murmurs, rubs, or gallops. Respiratory: Normal respiratory effort without tachypnea nor retractions. Breath sounds are clear and equal bilaterally. No wheezes/rales/rhonchi. Gastrointestinal: Soft and nontender. Normal bowel sounds Musculoskeletal: Nontender with normal range of motion in extremities. No lower extremity tenderness nor edema. Neurologic:  Normal speech and language. No gross focal neurologic deficits are appreciated.  Skin:  Skin  is warm, dry and intact. No rash noted. Psychiatric: Mood and affect are normal. Speech and behavior are normal.  ____________________________________________  ED COURSE:  As part of my medical decision making, I reviewed the following data within the [place] History obtained from family if available, nursing notes, old chart and ekg, as well as notes from prior ED visits. Patient presented for abdominal pain and vomiting, we will assess with labs and imaging as indicated at this time.    Procedures ____________________________________________   LABS (pertinent positives/negatives)  Labs Reviewed  COMPREHENSIVE METABOLIC PANEL - Abnormal; Notable for the following components:      Result Value   Calcium 8.6 (*)    Albumin 2.8 (*)    AST 68 (*)    All other components within normal limits  CBC - Abnormal; Notable for the following components:   RBC 3.40 (*)    HCT 34.6 (*)    MCV 101.8 (*)    MCH 35.5 (*)    Platelets 149 (*)    All other components within normal limits  LIPASE, BLOOD  URINALYSIS, COMPLETE (UACMP) WITH MICROSCOPIC    RADIOLOGY Images were viewed by me  Abdomen 2 view IMPRESSION: Nonobstructive bowel gas pattern. 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 This could be confirmed with right upper quadrant ultrasound. ____________________________________________  DIFFERENTIAL DIAGNOSIS   Pancreatitis, gastritis, dehydration, gastroenteritis, alcohol use disorder  FINAL ASSESSMENT AND PLAN  Abdominal pain, vomiting   Plan: The patient had presented for abdominal pain and vomiting. Patient's labs did not reveal any acute process, likely indicate alcohol use disorder. Patient's imaging also did not reveal any acute process, potentially revealed some underlying gallbladder problems.  Her labs are otherwise normal however.  She does drink alcohol daily and will need to cut down her alcohol use.  She will be discharged with antacids and antiemetics and referred to general surgery to have her gallbladder evaluated.   Laurence Aly, MD   Note: This note was generated in part or whole with voice recognition software. Voice recognition is usually quite accurate but there are transcription errors that can and very often do occur. I apologize for any typographical errors that were not detected and corrected.     Earleen Newport, MD 03/07/18 2100

## 2018-03-07 NOTE — ED Notes (Signed)
Pt to XR. ABCs intact. NAD 

## 2018-06-02 ENCOUNTER — Other Ambulatory Visit: Payer: Self-pay | Admitting: Family Medicine

## 2018-06-02 DIAGNOSIS — Z0000 Encounter for general adult medical examination without abnormal findings: Secondary | ICD-10-CM

## 2018-06-02 DIAGNOSIS — Z1231 Encounter for screening mammogram for malignant neoplasm of breast: Secondary | ICD-10-CM

## 2018-07-11 ENCOUNTER — Ambulatory Visit
Admission: RE | Admit: 2018-07-11 | Discharge: 2018-07-11 | Disposition: A | Discharge status: Home / Self Care | Payer: Medicare HMO | Source: Ambulatory Visit | Attending: Family Medicine | Admitting: Family Medicine

## 2018-07-11 DIAGNOSIS — Z1231 Encounter for screening mammogram for malignant neoplasm of breast: Secondary | ICD-10-CM | POA: Insufficient documentation

## 2018-07-11 DIAGNOSIS — Z0000 Encounter for general adult medical examination without abnormal findings: Secondary | ICD-10-CM | POA: Insufficient documentation

## 2018-07-11 DIAGNOSIS — Z13820 Encounter for screening for osteoporosis: Secondary | ICD-10-CM | POA: Diagnosis not present

## 2018-07-19 ENCOUNTER — Ambulatory Visit: Payer: Medicare HMO | Admitting: Physical Therapy

## 2018-07-24 ENCOUNTER — Encounter: Payer: Medicare HMO | Admitting: Physical Therapy

## 2018-07-25 ENCOUNTER — Ambulatory Visit: Payer: Medicare HMO | Attending: Family Medicine | Admitting: Physical Therapy

## 2018-07-25 ENCOUNTER — Encounter: Payer: Self-pay | Admitting: Physical Therapy

## 2018-07-25 ENCOUNTER — Other Ambulatory Visit: Payer: Self-pay

## 2018-07-25 DIAGNOSIS — M25611 Stiffness of right shoulder, not elsewhere classified: Secondary | ICD-10-CM | POA: Insufficient documentation

## 2018-07-25 DIAGNOSIS — R202 Paresthesia of skin: Secondary | ICD-10-CM | POA: Insufficient documentation

## 2018-07-25 DIAGNOSIS — M79632 Pain in left forearm: Secondary | ICD-10-CM

## 2018-07-25 DIAGNOSIS — M25511 Pain in right shoulder: Secondary | ICD-10-CM | POA: Insufficient documentation

## 2018-07-25 DIAGNOSIS — G8929 Other chronic pain: Secondary | ICD-10-CM | POA: Diagnosis present

## 2018-07-25 DIAGNOSIS — M6281 Muscle weakness (generalized): Secondary | ICD-10-CM | POA: Diagnosis present

## 2018-07-25 NOTE — Therapy (Signed)
[facility] [address] [place], [address] Phone: [phone]   Fax:  [phone]  Physical Therapy Evaluation  Patient Details  Name: Kristina Ellis MRN: [id number] Date of Birth: 1951-04-01 Referring Provider (PT): Rutherford Guys, MD   Encounter Date: 07/25/2018  PT End of Session - 07/26/18 0725    Visit Number  1    Number of Visits  12    Date for PT Re-Evaluation  09/06/18    Authorization Type  [employer]    Authorization Time Period  Current Cert period: 37/62/8315 - 09/06/2018 (latest PN: IE 07/25/2018)    Authorization - Visit Number  1    Authorization - Number of Visits  10    PT Start Time  1710    PT Stop Time  1815    PT Time Calculation (min)  65 min    Activity Tolerance  Patient tolerated treatment well;Patient limited by pain    Behavior During Therapy  [facility] for tasks assessed/performed       Past Medical History:  Diagnosis Date  . Hypertension     Past Surgical History:  Procedure Laterality Date  . BREAST CYST ASPIRATION Left     There were no vitals filed for this visit.   Subjective Assessment - 07/26/18 0719    Subjective  Patient states her condition is chronic. L Forearm: Patient reports she fell and dislocated her left shoulder in 2012 and never fully recovered her left forearm strength. Patient reports that 2 weeks ago she starsted getting cramping in the left forearm and her veins are distended. She states this was not how it was before. She had a recent mammogram that is negative. She states when she holds her left axillary region, she has relief of the left forearm symptoms. She reports the numbness and tingling did not resolve after her fall in 2012 but the cramping and increased paresthesia did not start until 2-3 weeks ago. She had previous PT for the left forearm with result in no long term benefit. She states she uses a ball to squeeze and some pulleys. This has  been about 5 years ago that her last PT. 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 She has pain there and denies that it has worst a lot recently. She has had previous physical therapy for her right shoulder with no lasting results.  This was about 5 years ago. 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     Pertinent History  Patient is a 67 y.o. female who presents to outpatient physical therapy with a referral for medical diagnosis R rotator cuff syndrome and L forearm weakness. This patient's chief complaints consist of chronic right shoulder pain, weakness and restricted ROM,  left forearm and hand paresthesia and cramping, leading to the following functional deficits: reaching carrying, gripping, typing, sleeping. Relevant past medical history and comorbidities include hypertension, history of NSTEMI (non-ST elevated myocardial infarction) ([place]) 2 years ago - regularly checked by MD, history of breast cyst aspiration, history of left knee dislocation as a child - no painful and bowed into genuvarum, vertigo about 2-3 months ago with "blacking out" on 4th of July (did not figure out what was wrong), went to hospital over it.     Diagnostic tests  recent ultrasound of  left axillary region unremarkable    Patient Stated Goals  "wants left wrist tingling and left forearm cramps to stop, and would love to see frozen shoulder gone too" (referring to right shoulder).     Currently in Pain?  Yes    Pain Score  5     Pain Location  Shoulder    Pain Orientation  Right    Pain Descriptors / Indicators  Nagging    Pain Type  Chronic pain    Pain Onset  More than a month ago    Pain Frequency  Constant    Aggravating Factors   cold air, extensive working it (wiping, scrubbing, sweeping, reaching, lifting)    Pain Relieving Factors  alcohol rub,  ibuprofen, heat in shower.     Effect of Pain on Daily Activities  right shoulder interferes with typing, left forearm cramping wakes her up at times. Difficulty doing housework, difficulty lifting right shoulder above 90 degrees, difficulty with dressing, grooming.    Multiple Pain Sites  Yes    Pain Score  --   states it is numb not painful   Pain Location  Other (Comment)   forearm and hand   Pain Orientation  Left    Pain Descriptors / Indicators  Tingling;Numbness;Cramping    Pain Type  Chronic pain;Acute pain    Pain Radiating Towards  left shoulder and left hand    Pain Onset  More than a month ago    Pain Frequency  Constant    Aggravating Factors    cold air, extensive working it, sleeping with forearm on top of cover    Pain Relieving Factors  alcohol rub, ibuprofen, getting in shower, holding axilla region with contralateral hand    Effect of Pain on Daily Activities  right shoulder interferes with typing, left forearm cramping wakes her up at times. Difficulty doing housework, difficulty lifting right shoulder above 90 degrees, difficulty with dressing, grooming.       SUBJECTIVE: HISTORY:  Patient is a 67 y.o. female who presents to outpatient physical therapy with a referral for medical diagnosis R rotator cuff syndrome and L forearm weakness. This patient's chief complaints consist of chronic right shoulder pain, weakness and restricted ROM,  left forearm and hand paresthesia and cramping, leading to the following functional deficits: reaching carrying, gripping, typing, sleeping,  Relevant past medical history and comorbidities include hypertension, history of NSTEMI (non-ST elevated myocardial infarction) ([place]) 2 years ago - regularly checked by MD, history of breast cyst aspiration, history of left knee dislocation as a child - no painful and bowed into genuvarum, vertigo about 2-3 months ago with "blacking out" on 4th of July (did not figure out what was wrong), went to  hospital over it.   Imaging: recent ultrasound of left axillary region unremarkable Response to previously administered skilled services: none at this time.  Patient states her condition is chronic.    L Forearm:  Patient reports she fell and dislocated her left shoulder in 2012 and never fully recovered her left forearm strength. Patient reports that 2 weeks ago she starsted getting cramping in the left forearm and her veins are distended. She states this was not how it was before. She had a recent mammogram that is negative. She states when she holds her left axillary region, she has relief of the left forearm symptoms. She reports the numbness and tingling did not resolve after her fall in 2012 but the cramping and increased paresthesia did not start  until 2-3 weeks ago. She had previous PT for the left forearm with result in no long term benefit. She states she uses a ball to squeeze and some pulleys. This has been about 5 years ago that her last PT.   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 She has pain there and denies that it has worst a lot recently. She has had previous physical therapy for her right shoulder with no lasting results.  This was about 5 years ago. 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    FUNCTION Patient-reported Outcome Measure: Choose an item.@@. Work: Writer, retired in 2007 Hobbies: she is going to school online, studying religion and bible history.  PLOF: worked full time, able to complete all ADLs, IADLs.  Current Level of Function: right shoulder interferes with typing, left forearm cramping wakes her up at times. Difficulty doing housework, difficulty lifting right shoulder above 90 degrees, difficulty with dressing, grooming.   PAIN Location: right shoulder, left forearm and hand.   Nature: right shoulder nagging; left forearm cramping, foream and hand numbness/tingling Pain Scale:  Patient reports current pain as 5/10 (R shoulder, left forearm numb), at best 4/10 (R shoulder) 0/10 (left hand/forearm because tingling/numbness), at worst 7-8/10 (R shoulder), 8-9/10 (Left forearm/hand due when it cramps) Paresthesia: left forearm and hand, occasionally lower right calf.  R Shoulder:  Aggravating factors: cold air, extensive working it (wiping, scrubbing, sweeping, reaching, lifting) Easing factors: alcohol rub, ibuprofen, heat in shower.  Left hand/forearm:  Aggravating factors: cold air, extensive working it, sleeping with forearm on top of cover Easing factors: alcohol rub, ibuprofen, getting in shower, holding axilla region with contralateral hand  SPECIAL SCREENING QUESTIONS Dizziness, double vision, difficulty swallowing, difficulty speaking, fainting spells or blackouts, facial numbness, or nausea: blackouts periodically - near July 4th went to hospital to get checked, unsure of cause.  Recent illness or fever: denies Recent unexplained weight loss: recent weight loss but not unexplained - had teeth removed and now wears dentures and is not eating as well. Has lost about 30-40 lbs since teeth were taken out in June.  Night pain/sweats: denies Recent bowel or bladder function abnormality: symptoms of urge incontinence.  Current symptoms/concerns not elsewhere described: denies Denies history of cancer.  Denies long term use of steroids.  Denies history of spinal surgery.      [facility] PT Assessment - 07/26/18 0001      Assessment   Medical Diagnosis  RC syndrome, L forearm weakness. (R shoulder pain- likely rotator cuff syndrome and L forearm weakness and cramping from brachial plexus)    Referring Provider (PT)  Rutherford Guys, MD    Hand Dominance  Right    Next MD Visit  4-5 months    Prior Therapy  last was 5 years ago with no lasting change according to pt       Balance Screen   Has the patient fallen in the past 6 months  Yes    How many times?  1    Has the patient had a decrease in activity level because of a fear of falling?   Yes    Is the patient reluctant to leave their home because of a fear of falling?   No      Home Film/video editor residence  Home Layout  One level      Prior Function   Level of Independence  Independent    Vocation  Retired    Leisure  computer work      Cognition   Overall Cognitive Status  Within Functional Limits for tasks assessed      Observation/Other Assessments   Observations  see note from 07/25/2018 for latest objective data        OBJECTIVE: OBSERVATION/INSPECTION: Patient presents with obesity, forward head, slumped posture. 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   NEUROLOGICAL: Dermatomes: C3 "thicker on left," C4 equal to light touch, C5 hypersensitive to light touch on left compared to right, C6-C8 diminished to light touch on left compared to right, T1 equal to light touch.   SPINE MOTION Cervical Spine AROM:  *Indicates pain - Flexion: =  100% - Extension: = 25% .relaxes R shoulder - Rotation: R= 75% , L = 75%. - Side Flexion: R= 100% R pain, L = 100%  PERIPHERAL JOINT MOTION (AROM/PROM in degrees):  *Indicates pain Shoulder   - Flexion: R = 73/135*, L = 150. - Extension: R = 40, L = 50* shoulder - Abduction: R = 72**/102*, L 150 . - External rotation: R = 20/20**, L = 52 * left forearm. - Internal rotation: R = L4*, L = T10. Elbow - Flexion: R = WFL, L = WFL motion but painful.  - Extension: R = WFL, L = motion WFL but painful -  Wrist - B = WFL except all feel tight on the left.   STRENGTH (isometric within available range):  *Indicates pain Shoulder  - Flexion: R = 5/5, L = 5/5.  - Abduction: R = 4/5, L = 5/5. - External rotation: R = 3+/5*, L = 4/5. - Internal rotation: R = 55, L = 5/5. Elbow - Flexion: R = 5/5, L =  5/5. - Extension: R = 5/5, L = 5/5. Wrist: B = 5/5 all motions.   Grip strength (in pounds, average of three measures).  - R: (54+52+50)/3 = 52 - L: (45+43+41)/ 3 = 43  SPECIAL TESTS: - Cervical axial compression = negative - Cervical axial distraction = relieved some tension over bilateral upper traps, no change in concordant symptoms.  - Spurling's test = negative bilaterally  ACCESSORY MOTION:  - Right shoulder with significant crepitus to accessory motion in all directions.   Objective measurements completed on examination: See above findings.    TREATMENT:  Patient goals: "wants left wrist tingling and left forearm cramps to stop, and would love to see frozen shoulder gone too" (referring to right shoulder).  Denies long term use of steroids.  Denies history of spinal surgery.   Therapeutic exercise: to centralize symptoms and improve ROM and strength required for successful completion of functional activities.  - seated R shoulder AAROM table slides into scaption, 5 second hold, x 10 - standing isometric R shoulder ER, Abduction, and flexion, 5 second holds, x 10 each - Standing rows with scapular retraction for improved postural and shoulder girdle strengthening and mobility. Required instruction for technique and cuing to retract, posteriorly tilt, and depress scapulae. x20 with red theraband. - attempted reverse push up on wall with scapular retraction but discontinued due to pain.  -Education on diagnosis, prognosis, POC, anatomy and physiology of current condition.  -Education on HEP including handout   HOME EXERCISE PROGRAM Access Code: [email or web address]  URL: [email or web address]  Date: 07/25/2018  Prepared by: Rosita Kea   Exercises  Isometric Shoulder External Rotation at Wall - 20 reps - 5 second hold - 2x daily - 7x weekly  Isometric Shoulder Abduction at Wall - 20 reps - 5 second hold - 2x daily - 7x weekly  Isometric Shoulder Flexion at Wall - 20  reps - 5 second hold - 2x daily - 7x weekly  Standing Bilateral Low Shoulder Row with Anchored Resistance - 10-15 reps - 1 second hold - 3 Sets - 1x daily - 3x weekly   Patient response to treatment:  Pt tolerated treatment well. Pt was able to complete all exercises with minimal to no lasting increase in pain or discomfort. Pt required cuing for proper technique and to facilitate improved neuromuscular control, strength, range of motion, and functional ability.     PT Education - 07/25/18 1707    Education Details  -Education on diagnosis, prognosis, POC, anatomy and physiology of current condition. -Education on HEP including handout . exercise form/purpose    Person(s) Educated  Patient    Methods  Explanation;Demonstration;Tactile cues;Handout;Verbal cues    Comprehension  Verbalized understanding;Returned demonstration       PT Short Term Goals - 07/26/18 0741      PT SHORT TERM GOAL #1   Title  Be independent with initial home exercise program for self-management of symptoms.    Baseline  Initial HEP provided at IE (07/25/2018)    Time  2    Period  Weeks    Status  New    Target Date  08/09/18        PT Long Term Goals - 07/26/18 0742      PT LONG TERM GOAL #1   Title  Be independent with a long-term home exercise program for self-management of symptoms.     Baseline  Initial HEP provided at IE (07/25/2018)    Time  6    Period  Weeks    Status  New    Target Date  09/06/18      PT LONG TERM GOAL #2   Title  Demonstrate improved FOTO score by 10 units to demonstrate improvement in overall condition and self-reported functional ability.     Baseline  to be assessed at visit 2    Time  6    Period  Weeks    Status  New    Target Date  09/06/18      PT LONG TERM GOAL #3   Title  Pt will decrease worst pain as reported on NPRS by at least 3 points in order to demonstrate clinically significant reduction in right shoulder pain.     Baseline  worst 7-8/10  (07/25/2018)    Time  6    Period  Weeks    Status  New    Target Date  09/06/18      PT LONG TERM GOAL #4   Title  Complete desired community, work and/or recreational activities without limitation due to current condition.     Baseline  see subjective exam    Time  6    Period  Weeks    Status  New    Target Date  09/06/18      PT LONG TERM GOAL #5   Title  Report decrease in "cramping" sensation in left forearm to less than 1x per week to improve quality of life.     Baseline  see subjective exam    Time  6    Period  Weeks    Status  New    Target Date  09/06/18             Plan - 07/26/18 0737    Clinical Impression Statement  Patient is a 67 y.o. female referred to outpatient physical therapy with a medical diagnosis of R rotator cuff syndrome and L forearm weakness who presents with signs and symptoms consistent with right shoulder pain most likely due to chronic rotator cuff tear, chronic left forearm and hand pain and impaired sensation acute on chronic flair related to history of left shoulder dislocation. Results in decreased quality of life, interference with ADLs, IADLs, and usual activities including typing, sleeping, housework, dressing, grooming.     History and Personal Factors relevant to plan of care:  hypertension, history of NSTEMI (non-ST elevated myocardial infarction) ([place]) 2 years ago - regularly checked by MD, history of breast cyst aspiration, history of left knee dislocation as a child - no painful and bowed into genuvarum, vertigo about 2-3 months ago with "blacking out" on 4th of July (did not figure out what was wrong), went to hospital over it.      Clinical Presentation  Evolving    Clinical Presentation due to:  reports recent worsening of left forearm cramping and paresthesia    Clinical Decision Making  Moderate    Rehab Potential  Fair    Clinical Impairments Affecting Rehab Potential  (+) appears to be good coper; (-) chronic nature of  symptoms, lack of improvement with previous PT    PT Frequency  2x / week    PT Duration  6 weeks    PT Treatment/Interventions  ADLs/Self Care Home Management;Aquatic Therapy;Cryotherapy;Moist Heat;Electrical Stimulation;Therapeutic activities;Therapeutic exercise;Neuromuscular re-education;Patient/family education;Manual techniques;Passive range of motion;Dry needling;Spinal Manipulations;Joint Manipulations;Other (comment)   joint mobilizations grades I-V, therapeutic neuroscience education for pain   PT Next Visit Plan  Assess response to HEP and progress as appropriate, further address left forearm    PT Home Exercise Plan  Medbridge Access Code: [email or web address]    Consulted and Agree with Plan of Care  Patient        Patient will benefit from skilled therapeutic intervention in order to improve the following deficits and impairments:  Increased muscle spasms, Impaired sensation, Decreased range of motion, Impaired perceived functional ability, Obesity, Decreased activity tolerance, Hypomobility, Impaired flexibility, Impaired UE functional use, Postural dysfunction, Pain  Visit Diagnosis: Chronic right shoulder pain  Pain in left forearm  Paresthesia of skin  Muscle weakness (generalized)  Stiffness of right shoulder, not elsewhere classified     Problem List Patient Active Problem List   Diagnosis Date Noted  . NSTEMI (non-ST elevated myocardial infarction) ([place]) 07/26/2016    Nancy Nordmann, PT, DPT 07/26/2018, 7:48 AM  [facility] [address] [place], [address] Phone: [phone]   Fax:  [phone]  Name: Kristina Ellis MRN: [id number] Date of Birth: 12-05-1950

## 2018-07-26 ENCOUNTER — Encounter: Payer: Medicare HMO | Admitting: Physical Therapy

## 2018-07-31 ENCOUNTER — Ambulatory Visit: Payer: Medicare HMO | Admitting: Physical Therapy

## 2018-07-31 ENCOUNTER — Telehealth: Payer: Self-pay | Admitting: Physical Therapy

## 2018-07-31 NOTE — Telephone Encounter (Signed)
Called pt when she did not show up to her appointment today at 4pm. No answer. Left voicemail checking on her, informing her of the missed visit, and informing her of her next visit 08/01/2018 at 9am with a request to call back if she is not planning to come. Call back number: [phone]

## 2018-08-01 ENCOUNTER — Telehealth: Payer: Self-pay | Admitting: Physical Therapy

## 2018-08-01 ENCOUNTER — Ambulatory Visit: Payer: Medicare HMO | Admitting: Physical Therapy

## 2018-08-01 NOTE — Telephone Encounter (Signed)
Called pt when she no-showed to her 9am appt today. No answer. Left VM letting her know of the missed visit and requesting a call back to let us know if she plans to continue attending PT. Also notified her of her next scheduled visit. Mon. 08/21/2018 at 3:15pm. Provided call back number: [phone]

## 2018-08-21 ENCOUNTER — Ambulatory Visit: Payer: Medicare HMO | Attending: Family Medicine | Admitting: Physical Therapy

## 2018-08-21 DIAGNOSIS — M25511 Pain in right shoulder: Secondary | ICD-10-CM | POA: Insufficient documentation

## 2018-08-21 DIAGNOSIS — M6281 Muscle weakness (generalized): Secondary | ICD-10-CM | POA: Insufficient documentation

## 2018-08-21 DIAGNOSIS — M25611 Stiffness of right shoulder, not elsewhere classified: Secondary | ICD-10-CM | POA: Diagnosis present

## 2018-08-21 DIAGNOSIS — R202 Paresthesia of skin: Secondary | ICD-10-CM | POA: Diagnosis present

## 2018-08-21 DIAGNOSIS — M79632 Pain in left forearm: Secondary | ICD-10-CM | POA: Diagnosis present

## 2018-08-21 DIAGNOSIS — G8929 Other chronic pain: Secondary | ICD-10-CM | POA: Diagnosis present

## 2018-08-21 NOTE — Therapy (Signed)
[facility] [address], [place], [address] Phone: [phone]   Fax:  [phone]  Physical Therapy Treatment  Patient Details  Name: Kristina Ellis MRN: [id number] Date of Birth: 1950-11-02 Referring Provider (PT): Rutherford Guys, MD   Encounter Date: 08/21/2018  PT End of Session - 08/21/18 1525    Visit Number  2    Number of Visits  12    Date for PT Re-Evaluation  09/06/18    Authorization Type  [employer] Medicare reporting period from 07/25/2018    Authorization Time Period  Current Cert period: 17/79/3903 - 09/06/2018 (latest PN: [facility] 07/25/2018)    Authorization - Visit Number  2    Authorization - Number of Visits  10    PT Start Time  1520    PT Stop Time  1600    PT Time Calculation (min)  40 min    Activity Tolerance  Patient tolerated treatment well;Patient limited by pain    Behavior During Therapy  [facility] for tasks assessed/performed       Past Medical History:  Diagnosis Date  . Hypertension     Past Surgical History:  Procedure Laterality Date  . BREAST CYST ASPIRATION Left     There were no vitals filed for this visit.  Subjective Assessment - 08/21/18 1521    Subjective  Patient states she has her usual constant pain in the right shoulder of 6/10. She states she had a good holiday but was under the weather and missed a couple of visits. She states her left forearm is not hurting upon arrival today. She states she has tried her HEP but not as much as she should have, doing something every day but is not specific about how much. She uses pulley, a ball for gripping, and some other exercies at home each time. She states she almost didn't come today because it is raining, which she feels worsens her pain.      Pertinent History  Patient is a 68 y.o. female who presents to outpatient physical therapy with a referral for medical diagnosis R rotator cuff syndrome and L forearm weakness. This  patient's chief complaints consist of chronic right shoulder pain, weakness and restricted ROM,  left forearm and hand paresthesia and cramping, leading to the following functional deficits: reaching carrying, gripping, typing, sleeping. Relevant past medical history and comorbidities include hypertension, history of NSTEMI (non-ST elevated myocardial infarction) ([place]) 2 years ago - regularly checked by MD, history of breast cyst aspiration, history of left knee dislocation as a child - no painful and bowed into genuvarum, vertigo about 2-3 months ago with "blacking out" on 4th of July (did not figure out what was wrong), went to hospital over it.     Diagnostic tests  recent ultrasound of left axillary region unremarkable    Patient Stated Goals  "wants left wrist tingling and left forearm cramps to stop, and would love to see frozen shoulder gone too" (referring to right shoulder).     Currently in Pain?  Yes    Pain Score  6     Pain Location  Shoulder    Pain Orientation  Right    Pain Descriptors / Indicators  Nagging    Pain Type  Chronic pain    Pain Radiating Towards  left forearm discomfort.    Pain Onset  More than a month ago    Pain Onset  More than a month ago  TREATMENT:  Patient goals: "wants left wrist tingling and left forearm cramps to stop, and would love to see frozen shoulder gone too" (referring to right shoulder).  Denies long term use of steroids.  Denies history of spinal surgery.   Therapeutic exercise: to centralize symptoms and improve ROM and strength required for successful completion of functional activities.  - Upper body ergometer no added resistance, using left UE for primary mover, to  encourage joint nutrition, warm tissue, induce analgesic effect of aerobic exercise, improve muscular strength and endurance,  and prepare for remainder of session. X 8 min during subjective exam.  - .supine R scapular protraction with shoulder flexed to 90-100 degrees x  10. With assistance to maintain ROM. To improve scapular strength.  - supine R shoulder alphabet in scapular protraction and shoulder elevated to 90-100 degrees. For improved right shoulder complex stability.  - supine AAROM shoulder flexion with PVC pipe held by both hands, focusing on controlled lower. X 2 min timed so pt can focus on form. With additional time for instruction in the exercise, appropriate rest, and transition. Cuing for slow lower and purpose of exercise.  - sidelying right shoulder ER against gravity with heavy cuing to maintain proper form. 2x10. To improve rotator cuff strength and activity tolerance to reduce pain and improve function.  - attempted sidelying R scapular protraction with shoulder at 90 degrees abduction x 5, but discontinued due to lack of ability to tolerate.  - reviewed standing isometric R shoulder ER, Abduction, 5 second holds, x 5 each; Pt with poor form despite cuing - attempted standing B scap protraction with elbows against wall but pt unable.  - hooklying chest press with PVC pipe and plus x 10 to improve scapular stability, cuing for protraction.   - standing scaption x 10, with range able to tolerate without gross compensation. To improve shoulder complex strength. Cuing for shoulder position.  -Education on diagnosis, prognosis, POC, anatomy and physiology of current condition.  -Education on HEP including handout   Manual therapy: to reduce pain and tissue tension, improve range of motion, neuromodulation, in order to promote improved ability to complete functional activities. - pt supine, glenohumeral joint mobilizations grades II-IV, distraction, AP, caudal with and without PROM through flexion and abduction ROM as tolerated.  - sidelying R scapular PNF D2 and D1 flexion patterns with manual resistance, x 20 each direction, for improved scapular strength and activity tolerance to improve glenohumeral rhythm. Cuing for appropriate activation patterns.    HOME EXERCISE PROGRAM Access Code: [email or web address]  URL: [email or web address]  Date: 08/21/2018  Prepared by: Rosita Kea   Exercises  Supine Shoulder Flexion with Dowel - 20 reps - 5 second hold - 2 Sets - 1x daily - 7x weekly  Supine Shoulder Press AAROM in Abduction with Dowel - 10-15 reps - 1 second hold - 3 Sets - 1x daily - 7x weekly  Shoulder External Rotation and Scapular Retraction with Resistance - 10-15 reps - 1 second hold - 3 Sets - 1x daily - 3x weekly  Standing Shoulder Scaption - 10-15 reps - 1 second hold - 3 Sets - 1x daily - 3x weekly  Standing Bilateral Low Shoulder Row with Anchored Resistance - 10-15 reps - 1 second hold - 3 Sets - 1x daily - 3x weekly     Patient response to treatment:  Pt tolerated treatment well. Pt was able to complete all exercises with minimal to no lasting increase in pain or discomfort, but had difficulty with  some attempted exercises due to pain. She also was frequently startled by "rubbing" sensation in the right glenohumeral joint consistent with joint derangement. Pt required cuing for proper technique and to facilitate improved neuromuscular control, strength, range of motion, and functional ability.  .  PT Education - 08/21/18 1525    Education Details  educated on importance of continuing HEP and attending treatment sessions, exercise form and purpose.     Person(s) Educated  Patient    Methods  Explanation;Demonstration;Tactile cues;Verbal cues    Comprehension  Verbalized understanding;Returned demonstration       PT Short Term Goals - 07/26/18 0741      PT SHORT TERM GOAL #1   Title  Be independent with initial home exercise program for self-management of symptoms.    Baseline  Initial HEP provided at IE (07/25/2018)    Time  2    Period  Weeks    Status  New    Target Date  08/09/18        PT Long Term Goals - 07/26/18 0742      PT LONG TERM GOAL #1   Title  Be independent with a long-term home exercise  program for self-management of symptoms.     Baseline  Initial HEP provided at IE (07/25/2018)    Time  6    Period  Weeks    Status  New    Target Date  09/06/18      PT LONG TERM GOAL #2   Title  Demonstrate improved FOTO score by 10 units to demonstrate improvement in overall condition and self-reported functional ability.     Baseline  to be assessed at visit 2    Time  6    Period  Weeks    Status  New    Target Date  09/06/18      PT LONG TERM GOAL #3   Title  Pt will decrease worst pain as reported on NPRS by at least 3 points in order to demonstrate clinically significant reduction in right shoulder pain.     Baseline  worst 7-8/10 (07/25/2018)    Time  6    Period  Weeks    Status  New    Target Date  09/06/18      PT LONG TERM GOAL #4   Title  Complete desired community, work and/or recreational activities without limitation due to current condition.     Baseline  see subjective exam    Time  6    Period  Weeks    Status  New    Target Date  09/06/18      PT LONG TERM GOAL #5   Title  Report decrease in "cramping" sensation in left forearm to less than 1x per week to improve quality of life.     Baseline  see subjective exam    Time  6    Period  Weeks    Status  New    Target Date  09/06/18            Plan - 08/22/18 1411    Clinical Impression Statement  Patient has attended 2 treatment sessions this episode of care and has made minimal progress towards goals at this point. This is her first treatment session since initial eval, which was over 3 weeks ago. She reports she had a lot of unexpected company and business that prevented her from attending during the holidays. With minimal attendance her lack of progress at this  point is not abnormal. She will benefit from physical therapy with more frequent treatment sessions and improved participation in HEP.  Patient is a 68 y.o. female referred to outpatient physical therapy with a medical diagnosis of R  rotator cuff syndrome and L forearm weakness who presents with signs and symptoms consistent with right shoulder pain most likely due to chronic rotator cuff tear, chronic left forearm and hand pain and impaired sensation acute on chronic flair related to history of left shoulder dislocation. Results in decreased quality of life, interference with ADLs, IADLs, and usual activities including typing, sleeping, housework, dressing, grooming. Patient will benefit from continued physical therapy treatment to address ongoing impairments and functional deficits, work towards stated goals to return to PLOF or maximal achievable function    Rehab Potential  Fair    Clinical Impairments Affecting Rehab Potential  (+) appears to be good coper; (-) chronic nature of symptoms, lack of improvement with previous PT    PT Frequency  2x / week    PT Duration  6 weeks    PT Treatment/Interventions  ADLs/Self Care Home Management;Aquatic Therapy;Cryotherapy;Moist Heat;Electrical Stimulation;Therapeutic activities;Therapeutic exercise;Neuromuscular re-education;Patient/family education;Manual techniques;Passive range of motion;Dry needling;Spinal Manipulations;Joint Manipulations;Other (comment)   joint mobilizations grades I-V, therapeutic neuroscience education for pain   PT Next Visit Plan  Assess response to HEP and progress as appropriate, further address left forearm    PT Home Exercise Plan  Medbridge Access Code: [email or web address]    Consulted and Agree with Plan of Care  Patient       Patient will benefit from skilled therapeutic intervention in order to improve the following deficits and impairments:  Increased muscle spasms, Impaired sensation, Decreased range of motion, Impaired perceived functional ability, Obesity, Decreased activity tolerance, Hypomobility, Impaired flexibility, Impaired UE functional use, Postural dysfunction, Pain  Visit Diagnosis: Chronic right shoulder pain  Pain in left  forearm  Paresthesia of skin  Muscle weakness (generalized)  Stiffness of right shoulder, not elsewhere classified     Problem List Patient Active Problem List   Diagnosis Date Noted  . NSTEMI (non-ST elevated myocardial infarction) ([place]) 07/26/2016    Nancy Nordmann, PT, DPT 08/22/2018, 2:13 PM  [place] [facility] [address], [place], [address] Phone: [phone]   Fax:  [phone]  Name: Kristina Ellis MRN: [id number] Date of Birth: 24-Mar-1951

## 2018-08-22 ENCOUNTER — Encounter: Payer: Self-pay | Admitting: Physical Therapy

## 2018-08-23 ENCOUNTER — Ambulatory Visit: Payer: Medicare HMO | Admitting: Physical Therapy

## 2018-08-23 DIAGNOSIS — M79632 Pain in left forearm: Secondary | ICD-10-CM

## 2018-08-23 DIAGNOSIS — M25511 Pain in right shoulder: Principal | ICD-10-CM

## 2018-08-23 DIAGNOSIS — R202 Paresthesia of skin: Secondary | ICD-10-CM

## 2018-08-23 DIAGNOSIS — G8929 Other chronic pain: Secondary | ICD-10-CM

## 2018-08-23 DIAGNOSIS — M6281 Muscle weakness (generalized): Secondary | ICD-10-CM

## 2018-08-23 DIAGNOSIS — M25611 Stiffness of right shoulder, not elsewhere classified: Secondary | ICD-10-CM

## 2018-08-23 NOTE — Therapy (Addendum)
[facility] [facility] [address], [place], [address] Phone: [phone]   Fax:  [phone]  Physical Therapy Treatment  Patient Details  Name: Kristina Ellis MRN: [id number] Date of Birth: 09-11-1950 Referring Provider (PT): Rutherford Guys, MD   Encounter Date: 08/23/2018  PT End of Session - 08/23/18 1307    Visit Number  3    Number of Visits  12    Date for PT Re-Evaluation  09/06/18    Authorization Type  [employer] Medicare reporting period from 07/25/2018    Authorization Time Period  Current Cert period: 72/53/6644 - 09/06/2018 (latest PN: [facility] 07/25/2018)    Authorization - Visit Number  3    Authorization - Number of Visits  10    PT Start Time  1300    PT Stop Time  1345    PT Time Calculation (min)  45 min    Activity Tolerance  Patient tolerated treatment well;Patient limited by pain    Behavior During Therapy  [facility] for tasks assessed/performed       Past Medical History:  Diagnosis Date  . Hypertension     Past Surgical History:  Procedure Laterality Date  . BREAST CYST ASPIRATION Left     There were no vitals filed for this visit.  Subjective Assessment - 08/23/18 1304    Subjective  Patient reports her right shoulder is "giving her a fit" today but she does not know why. She states it was 8/10 when she woke up this morning but now it has eased off to a 6/10. Her left forearm is tingling today as well. She reports she felt good for several hours following last treatment session.  She reports her HEP is going well.     Pertinent History  Patient is a 68 y.o. female who presents to outpatient physical therapy with a referral for medical diagnosis R rotator cuff syndrome and L forearm weakness. This patient's chief complaints consist of chronic right shoulder pain, weakness and restricted ROM,  left forearm and hand paresthesia and cramping, leading to the following functional deficits: reaching carrying,  gripping, typing, sleeping. Relevant past medical history and comorbidities include hypertension, history of NSTEMI (non-ST elevated myocardial infarction) ([place]) 2 years ago - regularly checked by MD, history of breast cyst aspiration, history of left knee dislocation as a child - no painful and bowed into genuvarum, vertigo about 2-3 months ago with "blacking out" on 4th of July (did not figure out what was wrong), went to hospital over it.     Diagnostic tests  recent ultrasound of left axillary region unremarkable    Patient Stated Goals  "wants left wrist tingling and left forearm cramps to stop, and would love to see frozen shoulder gone too" (referring to right shoulder).     Currently in Pain?  Yes    Pain Score  6     Pain Location  Shoulder    Pain Orientation  Right    Pain Descriptors / Indicators  Nagging    Pain Type  Chronic pain    Pain Radiating Towards  left forearm parestheia/discomfort    Pain Onset  More than a month ago    Pain Frequency  Constant    Pain Onset  More than a month ago        FOTO = 43 (taken 08/23/2018)   TREATMENT: Patient goals: "wants left wrist tingling and left forearm cramps to stop, and would love to  see frozen shoulder gone too" (referring to right shoulder).  Denies long term use of steroids.  Denies history of spinal surgery.  Therapeutic exercise:to centralize symptoms and improve ROM and strength required for successful completion of functional activities.  - Upper body ergometerno added resistance, using left UE for primary mover, to encourage joint nutrition, warm tissue, induce analgesic effect of aerobic exercise, improve muscular strength and endurance, and prepare for remainder of session. X 8 min during subjective exam.  - .supine R scapular protraction with shoulder flexed to 90-100 degrees x 10. With assistance to maintain ROM. To improve scapular strength.  With manual support and cuing - supine R shoulder alphabet in  scapular protraction and shoulder elevated to 90-100 degrees. For improved right shoulder complex stability. X 2 min with appropriate rest following.  - supine AAROM shoulder flexion with PVC pipe held by both hands, focusing on controlled lower. X 2 min timed so pt can focus on form. With additional time for instruction in the exercise, appropriate rest, and transition. Cuing for slow lower and purpose of exercise.  - hooklying chest press with PVC pipe and plus x 10 to improve scapular stability, cuing for protraction. with 2# ankle weight added to PVC pipe.  - attempted hooklying shoulder flexion with yellow band wrapped around wrists and lateral isometric hold, but unable to tolerate. Completed 3 reps with close guarding of arm. To improve right shoulder stability to decrease pain and improve function.  - sidelying right shoulder ER against gravity with heavy cuing to maintain proper form. 2x10. To improve rotator cuff strength and activity tolerance to reduce pain and improve function.    Manual therapy: to reduce pain and tissue tension, improve range of motion, neuromodulation, in order to promote improved ability to complete functional activities. - pt supine, glenohumeral joint mobilizations grades II-IV, distraction, AP, caudal with and without PROM through flexion, ER, and abduction ROM as tolerated.  - sidelying R scapular PNF D2 and D1 flexion patterns with manual resistance, x 20 each direction, for improved scapular strength and activity tolerance to improve glenohumeral rhythm. Cuing for appropriate activation patterns.   HOME EXERCISE PROGRAM Access Code: [email or web address]  URL: [email or web address]  Date: 08/21/2018  Prepared by: Rosita Kea   Exercises   Supine Shoulder Flexion with Dowel - 20 reps - 5 second hold - 2 Sets - 1x daily - 7x weekly   Supine Shoulder Press AAROM in Abduction with Dowel - 10-15 reps - 1 second hold - 3 Sets - 1x daily - 7x weekly    Shoulder External Rotation and Scapular Retraction with Resistance - 10-15 reps - 1 second hold - 3 Sets - 1x daily - 3x weekly   Standing Shoulder Scaption - 10-15 reps - 1 second hold - 3 Sets - 1x daily - 3x weekly   Standing Bilateral Low Shoulder Row with Anchored Resistance - 10-15 reps - 1 second hold - 3 Sets - 1x daily - 3x weekly     Patient response to treatment:  Pt tolerated treatment well. Pt was able to complete all exercises with minimal to no lasting increase in pain or discomfort, but had difficulty with some attempted exercises due to pain. She also was frequently startled by "rubbing" sensation in the right glenohumeral joint consistent with joint derangement. She reports it is no longer throbbing by end of visit. She demonstrates improved coordination and tolerance to exercise compared to last session. Pt required cuing for proper technique and to facilitate improved neuromuscular  control, strength, range of motion, and functional ability.    PT Education - 08/23/18 1306    Education Details  exercise form/purpose. Self-management techniques.    Person(s) Educated  Patient    Methods  Explanation;Demonstration;Tactile cues;Verbal cues    Comprehension  Verbalized understanding;Returned demonstration       PT Short Term Goals - 08/23/18 1307      PT SHORT TERM GOAL #1   Title  Be independent with initial home exercise program for self-management of symptoms.    Baseline  Initial HEP provided at IE (07/25/2018)    Time  2    Period  Weeks    Status  Achieved    Target Date  08/09/18        PT Long Term Goals - 08/23/18 1355      PT LONG TERM GOAL #1   Title  Be independent with a long-term home exercise program for self-management of symptoms.     Baseline  Initial HEP provided at IE (07/25/2018)    Time  6    Period  Weeks    Status  New    Target Date  09/06/18      PT LONG TERM GOAL #2   Title  Demonstrate improved FOTO score by 10 units to  demonstrate improvement in overall condition and self-reported functional ability.     Baseline   FOTO = 43 (taken 08/23/2018)    Time  6    Period  Weeks    Status  New    Target Date  09/06/18      PT LONG TERM GOAL #3   Title  Pt will decrease worst pain as reported on NPRS by at least 3 points in order to demonstrate clinically significant reduction in right shoulder pain.     Baseline  worst 7-8/10 (07/25/2018)    Time  6    Period  Weeks    Status  New    Target Date  09/06/18      PT LONG TERM GOAL #4   Title  Complete desired community, work and/or recreational activities without limitation due to current condition.     Baseline  see subjective exam    Time  6    Period  Weeks    Status  New    Target Date  09/06/18      PT LONG TERM GOAL #5   Title  Report decrease in "cramping" sensation in left forearm to less than 1x per week to improve quality of life.     Baseline  see subjective exam    Time  6    Period  Weeks    Status  New    Target Date  09/06/18            Plan - 08/23/18 1349    Clinical Impression Statement  Patient has attended 3 treatment sessions this episode of care and is starting to progress towards goals at this point. She did not return for follow ups until over 3 weeks after her initial eval and did not make much progress at that time. Her attendance has improved at this point, so she has improved potential for progress. Her baseline FOTO score was obtained this session after missing it her first two visits. She had good short term response to last treatment session focused on improving function of right shoulder and reducing pain. She will benefit from physical therapy with frequent treatment sessions and improved participation in HEP.  Patient is a 68 y.o. female referred to outpatient physical therapy with a medical diagnosis of R rotator cuff syndrome and L forearm weakness who presents with signs and symptoms consistent with right shoulder pain  most likely due to chronic rotator cuff tear, chronic left forearm and hand pain and impaired sensation acute on chronic flair related to history of left shoulder dislocation. Results in decreased quality of life, interference with ADLs, IADLs, and usual activities including typing, sleeping, housework, dressing, grooming. Patient will benefit from continued physical therapy treatment to address ongoing impairments and functional deficits, work towards stated goals to return to PLOF or maximal achievable function    Rehab Potential  Fair    Clinical Impairments Affecting Rehab Potential  (+) appears to be good coper; (-) chronic nature of symptoms, lack of improvement with previous PT    PT Frequency  2x / week    PT Duration  6 weeks    PT Treatment/Interventions  ADLs/Self Care Home Management;Aquatic Therapy;Cryotherapy;Moist Heat;Electrical Stimulation;Therapeutic activities;Therapeutic exercise;Neuromuscular re-education;Patient/family education;Manual techniques;Passive range of motion;Dry needling;Spinal Manipulations;Joint Manipulations;Other (comment)   joint mobilizations grades I-V, therapeutic neuroscience education for pain   PT Next Visit Plan  continue progressive right shoulder complex stabilization exercises as tolerated.     PT Home Exercise Plan  Medbridge Access Code: [email or web address]    Consulted and Agree with Plan of Care  Patient       Patient will benefit from skilled therapeutic intervention in order to improve the following deficits and impairments:  Increased muscle spasms, Impaired sensation, Decreased range of motion, Impaired perceived functional ability, Obesity, Decreased activity tolerance, Hypomobility, Impaired flexibility, Impaired UE functional use, Postural dysfunction, Pain  Visit Diagnosis: Chronic right shoulder pain  Pain in left forearm  Paresthesia of skin  Muscle weakness (generalized)  Stiffness of right shoulder, not elsewhere  classified     Problem List Patient Active Problem List   Diagnosis Date Noted  . NSTEMI (non-ST elevated myocardial infarction) ([place]) 07/26/2016    Nancy Nordmann, PT, DPT 08/23/2018, 1:56 PM  [facility] [address], [place], [address] Phone: [phone]   Fax:  [phone]  Name: Kristina Ellis MRN: [id number] Date of Birth: 1951-01-15

## 2018-08-28 ENCOUNTER — Ambulatory Visit: Payer: Medicare HMO | Admitting: Physical Therapy

## 2018-08-31 ENCOUNTER — Ambulatory Visit: Payer: Medicare HMO | Admitting: Physical Therapy

## 2018-09-05 ENCOUNTER — Ambulatory Visit: Payer: Medicare HMO | Admitting: Physical Therapy

## 2018-09-07 ENCOUNTER — Ambulatory Visit: Payer: Medicare HMO | Admitting: Physical Therapy

## 2018-09-19 ENCOUNTER — Ambulatory Visit: Payer: Medicare HMO | Admitting: Physical Therapy

## 2018-09-19 ENCOUNTER — Encounter: Payer: Self-pay | Admitting: Physical Therapy

## 2018-09-19 DIAGNOSIS — G8929 Other chronic pain: Secondary | ICD-10-CM

## 2018-09-19 DIAGNOSIS — M25511 Pain in right shoulder: Principal | ICD-10-CM

## 2018-09-19 DIAGNOSIS — M79632 Pain in left forearm: Secondary | ICD-10-CM

## 2018-09-19 DIAGNOSIS — R202 Paresthesia of skin: Secondary | ICD-10-CM

## 2018-09-19 DIAGNOSIS — M25611 Stiffness of right shoulder, not elsewhere classified: Secondary | ICD-10-CM

## 2018-09-19 DIAGNOSIS — M6281 Muscle weakness (generalized): Secondary | ICD-10-CM

## 2018-09-19 NOTE — Therapy (Signed)
[facility] [address] [place], [address] Phone: [phone]   Fax:  [phone]  Physical Therapy Discharge Summary Reporting period: 07/25/2018 - 09/19/2018  Patient Details  Name: Kristina Ellis MRN: [id number] Date of Birth: [birth year] Referring Provider (PT): Rutherford Guys, MD   Encounter Date: 09/19/2018    Past Medical History:  Diagnosis Date  . Hypertension     Past Surgical History:  Procedure Laterality Date  . BREAST CYST ASPIRATION Left     There were no vitals filed for this visit.  Subjective Assessment - 09/19/18 1537    Subjective  Patinet no-showed for her appointment today. She has a history of very poor attendance and her POC and certification period is expired at this point, so she is being discharged for lack of participation. She is not present at this visit.      Pertinent History  Patient is a 68 y.o. female who presents to outpatient physical therapy with a referral for medical diagnosis R rotator cuff syndrome and L forearm weakness. This patient's chief complaints consist of chronic right shoulder pain, weakness and restricted ROM,  left forearm and hand paresthesia and cramping, leading to the following functional deficits: reaching carrying, gripping, typing, sleeping. Relevant past medical history and comorbidities include hypertension, history of NSTEMI (non-ST elevated myocardial infarction) ([place]) 2 years ago - regularly checked by MD, history of breast cyst aspiration, history of left knee dislocation as a child - no painful and bowed into genuvarum, vertigo about 2-3 months ago with "blacking out" on 4th of July (did not figure out what was wrong), went to hospital over it.     Diagnostic tests  recent ultrasound of left axillary region unremarkable    Patient Stated Goals  "wants left wrist tingling and left forearm cramps to stop, and would love to see frozen shoulder gone  too" (referring to right shoulder).     Pain Onset  More than a month ago    Pain Onset  More than a month ago      OBJECTIVE:  Patient not present for exam. Please see previous documentation for latest objective data.     PT Short Term Goals - 09/19/18 1540      PT SHORT TERM GOAL #1   Title  Be independent with initial home exercise program for self-management of symptoms.    Baseline  Initial HEP provided at IE (07/25/2018)    Time  2    Period  Weeks    Status  Achieved    Target Date  08/09/18        PT Long Term Goals - 09/19/18 1540      PT LONG TERM GOAL #1   Title  Be independent with a long-term home exercise program for self-management of symptoms.     Baseline  Initial HEP provided at IE (07/25/2018)    Time  6    Period  Weeks    Status  Not Met    Target Date  09/06/18      PT LONG TERM GOAL #2   Title  Demonstrate improved FOTO score by 10 units to demonstrate improvement in overall condition and self-reported functional ability.     Baseline   FOTO = 43 (taken 08/23/2018)    Time  6    Period  Weeks    Status  Unable to assess    Target Date  09/06/18  PT LONG TERM GOAL #3   Title  Pt will decrease worst pain as reported on NPRS by at least 3 points in order to demonstrate clinically significant reduction in right shoulder pain.     Baseline  worst 7-8/10 (07/25/2018)    Time  6    Period  Weeks    Status  Not Met    Target Date  09/06/18      PT LONG TERM GOAL #4   Title  Complete desired community, work and/or recreational activities without limitation due to current condition.     Baseline  see subjective exam    Time  6    Period  Weeks    Status  Not Met    Target Date  09/06/18      PT LONG TERM GOAL #5   Title  Report decrease in "cramping" sensation in left forearm to less than 1x per week to improve quality of life.     Baseline  see subjective exam    Time  6    Period  Weeks    Status  Not Met    Target Date  09/06/18             Plan - 09/19/18 1539    Clinical Impression Statement  Patient attended 3 treatment session since 07/25/2018 and was unable to make any significant progress towards goals overall due to lack of participation. Patient is now being discharged from skilled physical therapy due to lack of participation.     Rehab Potential  Fair    Clinical Impairments Affecting Rehab Potential  (+) appears to be good coper; (-) chronic nature of symptoms, lack of improvement with previous PT    PT Frequency  2x / week    PT Duration  6 weeks    PT Treatment/Interventions  ADLs/Self Care Home Management;Aquatic Therapy;Cryotherapy;Moist Heat;Electrical Stimulation;Therapeutic activities;Therapeutic exercise;Neuromuscular re-education;Patient/family education;Manual techniques;Passive range of motion;Dry needling;Spinal Manipulations;Joint Manipulations;Other (comment)   joint mobilizations grades I-V, therapeutic neuroscience education for pain   PT Next Visit Plan  Patient is now discharged from physical therapy due to lack of participation    PT [place] Access Code: [email or web address]    Consulted and Agree with Plan of Care  Patient       Patient will benefit from skilled therapeutic intervention in order to improve the following deficits and impairments:  Increased muscle spasms, Impaired sensation, Decreased range of motion, Impaired perceived functional ability, Obesity, Decreased activity tolerance, Hypomobility, Impaired flexibility, Impaired UE functional use, Postural dysfunction, Pain  Visit Diagnosis: Chronic right shoulder pain  Pain in left forearm  Paresthesia of skin  Muscle weakness (generalized)  Stiffness of right shoulder, not elsewhere classified     Problem List Patient Active Problem List   Diagnosis Date Noted  . NSTEMI (non-ST elevated myocardial infarction) ([place]) 07/26/2016    Nancy Nordmann, PT, DPT 09/19/2018, 3:41 PM  [facility] [address], [place], [address] Phone: [phone]   Fax:  [phone]  Name: Kristina Ellis MRN: [id number] Date of Birth: 1950-10-09

## 2018-09-25 ENCOUNTER — Ambulatory Visit: Payer: Medicare HMO | Admitting: Physical Therapy

## 2018-09-27 ENCOUNTER — Ambulatory Visit: Payer: Medicare HMO | Admitting: Physical Therapy

## 2018-10-02 ENCOUNTER — Ambulatory Visit: Payer: Medicare HMO | Admitting: Physical Therapy

## 2018-10-04 ENCOUNTER — Encounter: Payer: Medicare HMO | Admitting: Physical Therapy

## 2019-07-16 ENCOUNTER — Other Ambulatory Visit: Payer: Self-pay | Admitting: Family Medicine

## 2019-07-16 DIAGNOSIS — Z1231 Encounter for screening mammogram for malignant neoplasm of breast: Secondary | ICD-10-CM

## 2019-12-20 DIAGNOSIS — Z8616 Personal history of COVID-19: Secondary | ICD-10-CM

## 2019-12-20 HISTORY — DX: Personal history of COVID-19: Z86.16

## 2019-12-21 DIAGNOSIS — I2510 Atherosclerotic heart disease of native coronary artery without angina pectoris: Secondary | ICD-10-CM | POA: Insufficient documentation

## 2019-12-21 HISTORY — DX: Atherosclerotic heart disease of native coronary artery without angina pectoris: I25.10

## 2019-12-22 DIAGNOSIS — I2721 Secondary pulmonary arterial hypertension: Secondary | ICD-10-CM

## 2019-12-22 DIAGNOSIS — I340 Nonrheumatic mitral (valve) insufficiency: Secondary | ICD-10-CM | POA: Insufficient documentation

## 2019-12-22 DIAGNOSIS — I071 Rheumatic tricuspid insufficiency: Secondary | ICD-10-CM | POA: Insufficient documentation

## 2019-12-22 HISTORY — DX: Secondary pulmonary arterial hypertension: I27.21

## 2020-02-04 HISTORY — PX: TEE WITH CARDIOVERSION: SHX5442

## 2020-07-03 ENCOUNTER — Other Ambulatory Visit: Payer: Self-pay

## 2020-07-03 ENCOUNTER — Encounter: Payer: Self-pay | Admitting: *Deleted

## 2020-07-03 ENCOUNTER — Observation Stay
Admission: EM | Admit: 2020-07-03 | Discharge: 2020-07-04 | Disposition: A | Discharge status: Home / Self Care | Payer: Medicare Other | Attending: Family Medicine | Admitting: Family Medicine

## 2020-07-03 ENCOUNTER — Emergency Department: Payer: Medicare Other

## 2020-07-03 DIAGNOSIS — Z20822 Contact with and (suspected) exposure to covid-19: Secondary | ICD-10-CM | POA: Insufficient documentation

## 2020-07-03 DIAGNOSIS — Z87891 Personal history of nicotine dependence: Secondary | ICD-10-CM | POA: Insufficient documentation

## 2020-07-03 DIAGNOSIS — I4891 Unspecified atrial fibrillation: Secondary | ICD-10-CM

## 2020-07-03 DIAGNOSIS — Z7982 Long term (current) use of aspirin: Secondary | ICD-10-CM | POA: Insufficient documentation

## 2020-07-03 DIAGNOSIS — E785 Hyperlipidemia, unspecified: Secondary | ICD-10-CM | POA: Diagnosis not present

## 2020-07-03 DIAGNOSIS — Z79899 Other long term (current) drug therapy: Secondary | ICD-10-CM | POA: Diagnosis not present

## 2020-07-03 DIAGNOSIS — I10 Essential (primary) hypertension: Secondary | ICD-10-CM | POA: Insufficient documentation

## 2020-07-03 DIAGNOSIS — R55 Syncope and collapse: Secondary | ICD-10-CM | POA: Diagnosis not present

## 2020-07-03 HISTORY — DX: Inflammatory liver disease, unspecified: K75.9

## 2020-07-03 HISTORY — DX: Unspecified atrial fibrillation: I48.91

## 2020-07-03 LAB — CBC WITH DIFFERENTIAL/PLATELET
Abs Immature Granulocytes: 0.01 10*3/uL (ref 0.00–0.07)
Basophils Absolute: 0.1 10*3/uL (ref 0.0–0.1)
Basophils Relative: 1 %
Eosinophils Absolute: 0.1 10*3/uL (ref 0.0–0.5)
Eosinophils Relative: 2 %
HCT: 32.8 % — ABNORMAL LOW (ref 36.0–46.0)
Hemoglobin: 10 g/dL — ABNORMAL LOW (ref 12.0–15.0)
Immature Granulocytes: 0 %
Lymphocytes Relative: 43 %
Lymphs Abs: 1.5 10*3/uL (ref 0.7–4.0)
MCH: 27.6 pg (ref 26.0–34.0)
MCHC: 30.5 g/dL (ref 30.0–36.0)
MCV: 90.6 fL (ref 80.0–100.0)
Monocytes Absolute: 0.3 10*3/uL (ref 0.1–1.0)
Monocytes Relative: 9 %
Neutro Abs: 1.6 10*3/uL — ABNORMAL LOW (ref 1.7–7.7)
Neutrophils Relative %: 45 %
Platelets: 164 10*3/uL (ref 150–400)
RBC: 3.62 MIL/uL — ABNORMAL LOW (ref 3.87–5.11)
RDW: 22 % — ABNORMAL HIGH (ref 11.5–15.5)
WBC: 3.6 10*3/uL — ABNORMAL LOW (ref 4.0–10.5)
nRBC: 0 % (ref 0.0–0.2)

## 2020-07-03 LAB — RESP PANEL BY RT-PCR (FLU A&B, COVID) ARPGX2
Influenza A by PCR: NEGATIVE
Influenza B by PCR: NEGATIVE
SARS Coronavirus 2 by RT PCR: NEGATIVE

## 2020-07-03 LAB — MAGNESIUM: Magnesium: 1.7 mg/dL (ref 1.7–2.4)

## 2020-07-03 LAB — BASIC METABOLIC PANEL
Anion gap: 11 (ref 5–15)
BUN: 14 mg/dL (ref 8–23)
CO2: 19 mmol/L — ABNORMAL LOW (ref 22–32)
Calcium: 8.8 mg/dL — ABNORMAL LOW (ref 8.9–10.3)
Chloride: 110 mmol/L (ref 98–111)
Creatinine, Ser: 0.94 mg/dL (ref 0.44–1.00)
GFR, Estimated: >60 mL/min (ref >60)
Glucose, Bld: 128 mg/dL — ABNORMAL HIGH (ref 70–99)
Potassium: 4 mmol/L (ref 3.5–5.1)
Sodium: 140 mmol/L (ref 135–145)

## 2020-07-03 LAB — PROTIME-INR
INR: 1.2 (ref 0.8–1.2)
Prothrombin Time: 15.1 seconds (ref 11.4–15.2)

## 2020-07-03 LAB — BRAIN NATRIURETIC PEPTIDE: B Natriuretic Peptide: 314.4 pg/mL — ABNORMAL HIGH (ref 0.0–100.0)

## 2020-07-03 LAB — TROPONIN I (HIGH SENSITIVITY): Troponin I (High Sensitivity): 21 ng/L — ABNORMAL HIGH (ref <18)

## 2020-07-03 LAB — TSH: TSH: 1.767 u[IU]/mL (ref 0.350–4.500)

## 2020-07-03 MED ORDER — RIVAROXABAN 10 MG PO TABS: 10.0000 mg | ORAL_TABLET | Freq: Every day | ORAL | Status: DC

## 2020-07-03 MED ORDER — MAGNESIUM OXIDE 400 (241.3 MG) MG PO TABS
400.0000 mg | ORAL_TABLET | Freq: Two times a day (BID) | ORAL | Status: DC
  Administered 2020-07-03 – 2020-07-04 (×2): 400 mg via ORAL
  Filled 2020-07-03 (×2): qty 1

## 2020-07-03 MED ORDER — ALPRAZOLAM 0.25 MG PO TABS
0.2500 mg | ORAL_TABLET | Freq: Two times a day (BID) | ORAL | Status: DC | PRN
  Filled 2020-07-03: qty 1

## 2020-07-03 MED ORDER — DILTIAZEM HCL 25 MG/5ML IV SOLN
5.0000 mg | Freq: Once | INTRAVENOUS | Status: AC
  Administered 2020-07-03: 5 mg via INTRAVENOUS
  Filled 2020-07-03: qty 5

## 2020-07-03 MED ORDER — FERROUS SULFATE 325 (65 FE) MG PO TABS
325.0000 mg | ORAL_TABLET | Freq: Every day | ORAL | Status: DC
  Administered 2020-07-04: 325 mg via ORAL
  Filled 2020-07-03: qty 1

## 2020-07-03 MED ORDER — APIXABAN 5 MG PO TABS
5.0000 mg | ORAL_TABLET | Freq: Two times a day (BID) | ORAL | Status: DC
  Administered 2020-07-03 – 2020-07-04 (×2): 5 mg via ORAL
  Filled 2020-07-03 (×2): qty 1

## 2020-07-03 MED ORDER — ACETAMINOPHEN 325 MG PO TABS
650.0000 mg | ORAL_TABLET | ORAL | Status: DC | PRN
  Administered 2020-07-04: 650 mg via ORAL
  Filled 2020-07-03: qty 2

## 2020-07-03 MED ORDER — LABETALOL HCL 5 MG/ML IV SOLN
20.0000 mg | INTRAVENOUS | Status: DC | PRN
  Administered 2020-07-04: 20 mg via INTRAVENOUS
  Filled 2020-07-03: qty 4

## 2020-07-03 MED ORDER — METOPROLOL TARTRATE 50 MG PO TABS
50.0000 mg | ORAL_TABLET | Freq: Once | ORAL | Status: AC
  Administered 2020-07-03: 50 mg via ORAL
  Filled 2020-07-03: qty 1

## 2020-07-03 MED ORDER — ZOLPIDEM TARTRATE 5 MG PO TABS: 5.0000 mg | ORAL_TABLET | Freq: Every evening | ORAL | Status: DC | PRN

## 2020-07-03 MED ORDER — ATORVASTATIN CALCIUM 20 MG PO TABS: 20.0000 mg | ORAL_TABLET | Freq: Every day | ORAL | Status: DC

## 2020-07-03 MED ORDER — ONDANSETRON HCL 4 MG/2ML IJ SOLN: 4.0000 mg | Freq: Four times a day (QID) | INTRAMUSCULAR | Status: DC | PRN

## 2020-07-03 MED ORDER — SODIUM CHLORIDE 0.9 % IV SOLN: INTRAVENOUS | Status: DC

## 2020-07-03 MED ORDER — METOPROLOL SUCCINATE ER 50 MG PO TB24
50.0000 mg | ORAL_TABLET | Freq: Every day | ORAL | Status: DC
  Administered 2020-07-04: 50 mg via ORAL
  Filled 2020-07-03 (×2): qty 1

## 2020-07-03 NOTE — ED Notes (Signed)
One unsuccessful IV attempt by another RN. MD aware.

## 2020-07-03 NOTE — ED Provider Notes (Signed)
[facility] Emergency Department Provider Note   ____________________________________________   First MD Initiated Contact with Patient 07/03/20 1552     (approximate)  I have reviewed the triage vital signs and the nursing notes.   HISTORY  Chief Complaint Loss of Consciousness    HPI Kristina Ellis is a 69 y.o. female history of atrial fibrillation, hypertension previous NSTEMI  Patient reports that she is passed out 3 times today. She is seeing family for Thanksgiving, she has not had her medication for the last 2 days.  She reports that she was outside getting sun when she started feeling lightheaded, she then started to pass out.  She did not fall or become injured though, her son was next to her and she was held by her son so as not to strike her head.  She came to felt somewhat better, then went to the house and she reports that she had 2 more episodes of family was watching her at with sitting in a chair where she passed out and again was not allowed to strike her head.  No headache no neck pain.  No chest pain or trouble breathing.  She does report she has had similar symptoms like this when she had atrial fibrillation, but follows with [facility] cardiology and last time her heart was in normal rhythm after a cardioversion     Past Medical History:  Diagnosis Date  . Atrial fibrillation ([place])   . Hepatitis   . Hypertension     Patient Active Problem List   Diagnosis Date Noted  . NSTEMI (non-ST elevated myocardial infarction) ([place]) 07/26/2016    Past Surgical History:  Procedure Laterality Date  . BREAST CYST ASPIRATION Left     Prior to Admission medications   Medication Sig Start Date End Date Taking? Authorizing Provider  acetaminophen (TYLENOL) 325 MG tablet Take 2 tablets (650 mg total) by mouth every 6 (six) hours as needed for mild pain or headache. Patient not taking: Reported on 07/25/2018 07/27/16   Nicholes Mango, MD  amLODipine  (NORVASC) 10 MG tablet Take 10 mg by mouth daily.    [provider]  aspirin EC 81 MG EC tablet Take 1 tablet (81 mg total) by mouth daily. 07/27/16   Nicholes Mango, MD  atorvastatin (LIPITOR) 40 MG tablet Take 0.5 tablets (20 mg total) by mouth daily at 6 PM. Patient not taking: Reported on 07/25/2018 07/27/16   Nicholes Mango, MD  clopidogrel (PLAVIX) 75 MG tablet Take 1 tablet (75 mg total) by mouth daily. Patient not taking: Reported on 07/25/2018 07/27/16   Nicholes Mango, MD  famotidine (PEPCID) 20 MG tablet Take 1 tablet (20 mg total) by mouth 2 (two) times daily. Patient not taking: Reported on 07/25/2018 03/07/18   Earleen Newport, MD  furosemide (LASIX) 20 MG tablet Take 1 tablet (20 mg total) by mouth daily. Patient not taking: Reported on 07/25/2018 07/27/16   Nicholes Mango, MD  hydrochlorothiazide (HYDRODIURIL) 12.5 MG tablet Take 1 tablet by mouth daily. 09/10/10   [provider]  isosorbide mononitrate (IMDUR) 30 MG 24 hr tablet Take 1 tablet (30 mg total) by mouth daily. Patient not taking: Reported on 07/25/2018 07/27/16   Nicholes Mango, MD  lisinopril (PRINIVIL,ZESTRIL) 10 MG tablet Take 10 mg by mouth daily.    [provider]  metoprolol tartrate (LOPRESSOR) 25 MG tablet Take 1 tablet (25 mg total) by mouth 2 (two) times daily. Patient not taking: Reported on 07/25/2018 07/27/16  Nicholes Mango, MD  nitroGLYCERIN (NITROSTAT) 0.4 MG SL tablet Place 1 tablet (0.4 mg total) under the tongue every 5 (five) minutes x 3 doses as needed for chest pain. Patient not taking: Reported on 07/25/2018 07/27/16   Nicholes Mango, MD  ondansetron (ZOFRAN ODT) 4 MG disintegrating tablet Take 1 tablet (4 mg total) by mouth every 8 (eight) hours as needed for nausea or vomiting. Patient not taking: Reported on 07/25/2018 03/07/18   Earleen Newport, MD    Allergies Patient has no known allergies.  Family History  Problem Relation Age of Onset  . Aneurysm Mother   .  Colon cancer Father   . Lung cancer Brother     Social History Social History   Tobacco Use  . Smoking status: Former Smoker    Packs/day: 0.10    Years: 10.00    Pack years: 1.00    Types: Cigarettes  . Smokeless tobacco: Never Used  . Tobacco comment: occasional smoker  Vaping Use  . Vaping Use: Never used  Substance Use Topics  . Alcohol use: Yes    Alcohol/week: 5.0 standard drinks    Types: 5 Cans of beer per week    Comment: weekly  . 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 ENT: No sore throat. Cardiovascular: Denies chest pain. Respiratory: Denies shortness of breath. Gastrointestinal: No abdominal pain.   Genitourinary: Negative for dysuria. Musculoskeletal: Negative for back pain. Skin: Negative for rash. Neurological: Negative for headaches, areas of focal weakness or numbness.    ____________________________________________   PHYSICAL EXAM:  VITAL SIGNS: ED Triage Vitals  Enc Vitals Group     BP 07/03/20 1551 (!) 150/97     Pulse Rate 07/03/20 1551 (!) 113     Resp 07/03/20 1551 18     Temp 07/03/20 1551 97.6 F (36.4 C)     Temp Source 07/03/20 1551 Oral     SpO2 07/03/20 1551 100 %     Weight 07/03/20 1554 138 lb (62.6 kg)     Height 07/03/20 1554 4\' 11"  (1.499 m)     Head Circumference --      Peak Flow --      Pain Score 07/03/20 1554 0     Pain Loc --      Pain Edu? --      Excl. in [place]? --     Constitutional: Alert and oriented. Well appearing and in no acute distress. Eyes: Conjunctivae are normal. Head: Atraumatic. Nose: No congestion/rhinnorhea. Mouth/Throat: Mucous membranes are moist. Neck: No stridor.  Cardiovascular: Mildly tachycardic and irregular.  Grossly normal heart sounds.  Good peripheral circulation. Respiratory: Normal respiratory effort.  No retractions. Lungs CTAB. Gastrointestinal: Soft and  nontender. No distention. Musculoskeletal: No lower extremity tenderness nor edema. Neurologic:  Normal speech and language. No gross focal neurologic deficits are appreciated.  Skin:  Skin is warm, dry and intact. No rash noted. Psychiatric: Mood and affect are normal. Speech and behavior are normal.  ____________________________________________   LABS (all labs ordered are listed, but only abnormal results are displayed)  Labs Reviewed  BASIC METABOLIC PANEL - Abnormal; Notable for the following components:      Result Value   CO2 19 (*)    Glucose, Bld 128 (*)    Calcium 8.8 (*)    All other components within normal limits  BRAIN NATRIURETIC  PEPTIDE - Abnormal; Notable for the following components:   B Natriuretic Peptide 314.4 (*)    All other components within normal limits  CBC WITH DIFFERENTIAL/PLATELET - Abnormal; Notable for the following components:   WBC 3.6 (*)    RBC 3.62 (*)    Hemoglobin 10.0 (*)    HCT 32.8 (*)    RDW 22.0 (*)    Neutro Abs 1.6 (*)    All other components within normal limits  TROPONIN I (HIGH SENSITIVITY) - Abnormal; Notable for the following components:   Troponin I (High Sensitivity) 21 (*)    All other components within normal limits  RESP PANEL BY RT-PCR (FLU A&B, COVID) ARPGX2  PROTIME-INR  TROPONIN I (HIGH SENSITIVITY)   ____________________________________________  EKG  Reviewed notes by me at 1555 Heart rate 120 QRS 90 QTc 500 Atrial fibrillation, mild prolongation of QT.  Probable old anteroseptal infarct.  No evidence of acute ischemia denoted ____________________________________________  RADIOLOGY  DG Chest Port 1 View  Result Date: 07/03/2020 CLINICAL DATA:  Loss of consciousness, 3 syncopal episodes, history of atrial fibrillation, not taking medications for 2 days EXAM: PORTABLE CHEST 1 VIEW COMPARISON:  Portable exam 1604 hours compared to 07/26/2016 FINDINGS: Normal heart size, mediastinal contours, and pulmonary  vascularity. Mitral annular calcification. Atherosclerotic calcification aorta. Lungs clear. No pulmonary infiltrate, pleural effusion, or pneumothorax. Advanced BILATERAL glenohumeral degenerative changes. IMPRESSION: No acute abnormalities. Electronically Signed   By: Lavonia Dana M.D.   On: 07/03/2020 16:20    Chest x-ray reviewed negative for acute ____________________________________________   PROCEDURES  Procedure(s) performed: None  .1-3 Lead EKG Interpretation Performed by: Delman Kitten, MD Authorized by: Delman Kitten, MD     Interpretation: abnormal     ECG rate:  110   ECG rate assessment: tachycardic     Rhythm: atrial fibrillation     Ectopy: PVCs      Critical Care performed: No  ____________________________________________   INITIAL IMPRESSION / ASSESSMENT AND PLAN / ED COURSE  Pertinent labs & imaging results that were available during my care of the patient were reviewed by me and considered in my medical decision making (see chart for details).   Patient presents after multiple episodes of syncope.  Is on anticoagulation but is not taking any of her medications for 2 days.  Denies any head injury and she was evidently not allowed to fall in any instance.  She does have a atrial fibrillation with mild rapid ventricular response though ECG EMS reports heart rate was high as 170 in their care.  We'll give her Lopressor which she is prescribed, she is currently asymptomatic.  I do have concerns however with 3 episodes of syncope that she will require observation, likely cardiology consultation as well given her recurrence back to atrial fibrillation.  Denies chest pain lowering risk of ACS.  No pulmonary symptoms.  No abdominal pain.  No headache.  I doubt intracranial hemorrhage, especially as she has not had any head trauma    ----------------------------------------- 6:17 PM on 07/03/2020 -----------------------------------------  Labs reviewed, slightly  decreased CO2.  Slight Lee low calcium.  BNP mildly elevated.  Minimally elevated troponin.  Patient is currently pain and symptom-free.  Resting comfortably.  Understanding of plan to admit her for observation and further treatment regarding atrial fibrillation and syncope.  Kristina Ellis was evaluated in Emergency Department on 07/03/2020 for the symptoms described in the history of present illness. She was evaluated in the context of the global COVID-19 pandemic,  which necessitated consideration that the patient might be at risk for infection with the SARS-CoV-2 virus that causes COVID-19. Institutional protocols and algorithms that pertain to the evaluation of patients at risk for COVID-19 are in a state of rapid change based on information released by regulatory bodies including the CDC and federal and state organizations. These policies and algorithms were followed during the patient's care in the ED.  ----------------------------------------- 6:33 PM on 07/03/2020 -----------------------------------------  Patient resting comfortably, vital signs much improved.  Patient understand agreeable with plan for admission.  Admission discussed with Dr. Sidney Ace  ____________________________________________   FINAL CLINICAL IMPRESSION(S) / ED DIAGNOSES  Final diagnoses:  Syncope and collapse  Atrial fibrillation with RVR ([place])        Note:  This document was prepared using Dragon voice recognition software and may include unintentional dictation errors       Delman Kitten, MD 07/03/20 [phone]

## 2020-07-03 NOTE — ED Triage Notes (Signed)
Per EMS report, patient is visiting family and has been cooking since early morning. Patient and family report patient had 3 syncopal episodes. Patient has a history of afib and not taken her medications for two days due to visiting family. Patient denies hitting her head. Patient states she had to be cardioverted in the past.

## 2020-07-03 NOTE — ED Notes (Signed)
Report given to Mac RN.

## 2020-07-03 NOTE — H&P (Signed)
[place]   PATIENT NAME: Kristina Ellis    MR#:  [id number]  DATE OF BIRTH:  [birth year]  DATE OF ADMISSION:  07/03/2020  PRIMARY CARE PHYSICIAN: [facility] [place]   REQUESTING/REFERRING PHYSICIAN: Delman Kitten, MD  CHIEF COMPLAINT:   Chief Complaint  Patient presents with  . Loss of Consciousness    HISTORY OF PRESENT ILLNESS:  Juvia Aerts  is a 69 y.o. Caucasian female with a known history of hypertension, atrial fibrillation on Eliquis and hepatitis, who presented to the emergency room with acute onset of syncope for 3 episodes.  She has been traveling from [place] to here and has not taken her medications for the last couple of days which include Toprol-XL and Xarelto.  She was noted to be in atrial fibrillation with RVR by EMS with a heart rate ranging from 120-170.  She stated it showed been diaphoretic and feel hot with associated palpitations without chest pain before she passes out.  She denies any dyspnea or cough or wheezing or hemoptysis.  No nausea or vomiting or abdominal pain.  No bleeding diathesis.  No dysuria, oliguria or hematuria or flank pain.  Upon presentation to the emergency room, blood pressure was 150/97 with a heart rate of 113 and respiratory rate of 18 and later 21.  Labs were remarkable for a BNP of 314.4.  High-sensitivity troponin I was 21 and CBC showed mild leukopenia and anemia worse than the previous levels 2 years ago.  INR was 1.2 and PT 15.1.  Influenza antigens and COVID-19 PCR came back negative. EKG showed atrial fibrillation with rapid ventricular response of 122, left anterior fascicular block and anteroseptal Q waves with prolonged QT interval (QTC 501 MS).  Portable chest x-ray showed no acute cardiopulmonary disease.  The patient was given Lopressor 50 mg p.o.  Heart rate has been down to 102 and later 99.  She will be admitted on observation progressive unit bed for further evaluation and management.  PAST MEDICAL  HISTORY:   Past Medical History:  Diagnosis Date  . Atrial fibrillation ([place])   . Hepatitis   . Hypertension   Dyslipidemia  PAST SURGICAL HISTORY:   Past Surgical History:  Procedure Laterality Date  . BREAST CYST ASPIRATION Left     SOCIAL HISTORY:   Social History   Tobacco Use  . Smoking status: Former Smoker    Packs/day: 0.10    Years: 10.00    Pack years: 1.00    Types: Cigarettes  . Smokeless tobacco: Never Used  . Tobacco comment: occasional smoker  Substance Use Topics  . Alcohol use: Yes    Alcohol/week: 5.0 standard drinks    Types: 5 Cans of beer per week    Comment: weekly    FAMILY HISTORY:   Family History  Problem Relation Age of Onset  . Aneurysm Mother   . Colon cancer Father   . Lung cancer Brother     DRUG ALLERGIES:  No Known Allergies  REVIEW OF SYSTEMS:   ROS As per history of present illness. All pertinent systems were reviewed above. Constitutional, HEENT, cardiovascular, respiratory, GI, GU, musculoskeletal, neuro, psychiatric, endocrine, integumentary and hematologic systems were reviewed and are otherwise negative/unremarkable except for positive findings mentioned above in the HPI.   MEDICATIONS AT HOME:   Prior to Admission medications   Medication Sig Start Date End Date Taking? Authorizing Provider  acetaminophen (TYLENOL) 325 MG tablet Take 2 tablets (650 mg total) by mouth  every 6 (six) hours as needed for mild pain or headache. Patient not taking: Reported on 07/25/2018 07/27/16   Nicholes Mango, MD  amLODipine (NORVASC) 10 MG tablet Take 10 mg by mouth daily.    [provider]  aspirin EC 81 MG EC tablet Take 1 tablet (81 mg total) by mouth daily. 07/27/16   Nicholes Mango, MD  atorvastatin (LIPITOR) 40 MG tablet Take 0.5 tablets (20 mg total) by mouth daily at 6 PM. Patient not taking: Reported on 07/25/2018 07/27/16   Nicholes Mango, MD  clopidogrel (PLAVIX) 75 MG tablet Take 1 tablet (75 mg total) by mouth  daily. Patient not taking: Reported on 07/25/2018 07/27/16   Nicholes Mango, MD  famotidine (PEPCID) 20 MG tablet Take 1 tablet (20 mg total) by mouth 2 (two) times daily. Patient not taking: Reported on 07/25/2018 03/07/18   Earleen Newport, MD  furosemide (LASIX) 20 MG tablet Take 1 tablet (20 mg total) by mouth daily. Patient not taking: Reported on 07/25/2018 07/27/16   Nicholes Mango, MD  hydrochlorothiazide (HYDRODIURIL) 12.5 MG tablet Take 1 tablet by mouth daily. 09/10/10   [provider]  isosorbide mononitrate (IMDUR) 30 MG 24 hr tablet Take 1 tablet (30 mg total) by mouth daily. Patient not taking: Reported on 07/25/2018 07/27/16   Nicholes Mango, MD  lisinopril (PRINIVIL,ZESTRIL) 10 MG tablet Take 10 mg by mouth daily.    [provider]  metoprolol tartrate (LOPRESSOR) 25 MG tablet Take 1 tablet (25 mg total) by mouth 2 (two) times daily. Patient not taking: Reported on 07/25/2018 07/27/16   Nicholes Mango, MD  nitroGLYCERIN (NITROSTAT) 0.4 MG SL tablet Place 1 tablet (0.4 mg total) under the tongue every 5 (five) minutes x 3 doses as needed for chest pain. Patient not taking: Reported on 07/25/2018 07/27/16   Nicholes Mango, MD  ondansetron (ZOFRAN ODT) 4 MG disintegrating tablet Take 1 tablet (4 mg total) by mouth every 8 (eight) hours as needed for nausea or vomiting. Patient not taking: Reported on 07/25/2018 03/07/18   Earleen Newport, MD      VITAL SIGNS:  Blood pressure 137/87, pulse (!) 102, temperature 97.6 F (36.4 C), temperature source Oral, resp. rate (!) 23, height 4\' 11"  (1.499 m), weight 62.6 kg, SpO2 100 %.  PHYSICAL EXAMINATION:  Physical Exam  GENERAL:  69 y.o.-year-old Caucasian female patient lying in the bed with no acute distress.  EYES: Pupils equal, round, reactive to light and accommodation. No scleral icterus. Extraocular muscles intact.  HEENT: Head atraumatic, normocephalic. Oropharynx and nasopharynx clear.  NECK:  Supple, no  jugular venous distention. No thyroid enlargement, no tenderness.  LUNGS: Normal breath sounds bilaterally, no wheezing, rales,rhonchi or crepitation. No use of accessory muscles of respiration.  CARDIOVASCULAR: Irregularly irregular tachycardic rhythm, S1, S2 normal. No murmurs, rubs, or gallops.  ABDOMEN: Soft, nondistended, nontender. Bowel sounds present. No organomegaly or mass.  EXTREMITIES: No pedal edema, cyanosis, or clubbing.  NEUROLOGIC: Cranial nerves II through XII are intact. Muscle strength 5/5 in all extremities. Sensation intact. Gait not checked.  PSYCHIATRIC: The patient is alert and oriented x 3.  Normal affect and good eye contact. SKIN: No obvious rash, lesion, or ulcer.   LABORATORY PANEL:   CBC Recent Labs  Lab 07/03/20 1602  WBC 3.6*  HGB 10.0*  HCT 32.8*  PLT 164   ------------------------------------------------------------------------------------------------------------------  Chemistries  Recent Labs  Lab 07/03/20 1602  NA 140  K 4.0  CL 110  CO2 19*  GLUCOSE  128*  BUN 14  CREATININE 0.94  CALCIUM 8.8*   ------------------------------------------------------------------------------------------------------------------  Cardiac Enzymes No results for input(s): TROPONINI in the last 168 hours. ------------------------------------------------------------------------------------------------------------------  RADIOLOGY:  DG Chest Port 1 View  Result Date: 07/03/2020 CLINICAL DATA:  Loss of consciousness, 3 syncopal episodes, history of atrial fibrillation, not taking medications for 2 days EXAM: PORTABLE CHEST 1 VIEW COMPARISON:  Portable exam 1604 hours compared to 07/26/2016 FINDINGS: Normal heart size, mediastinal contours, and pulmonary vascularity. Mitral annular calcification. Atherosclerotic calcification aorta. Lungs clear. No pulmonary infiltrate, pleural effusion, or pneumothorax. Advanced BILATERAL glenohumeral degenerative changes.  IMPRESSION: No acute abnormalities. Electronically Signed   By: Lavonia Dana M.D.   On: 07/03/2020 16:20      IMPRESSION AND PLAN:   1.  Paroxysmal atrial fibrillation with rapid ventricular response. -This likely secondary to noncompliance. -The patient will be admitted to a progressive unit bed. -We will continue her Toprol-XL and Eliquis and give 1 dose of IV Cardizem.. -Obtain TSH level and magnesium level. -We will cycle troponin I's. -The patient's CHA2DS2-VASc score is 3. -Cardiology consultation will be obtained as well as 2D echo. -I notified Dr. Ubaldo Glassing about the patient.  2.  Recurrent syncope. -This like secondary to #1. -Management as above. -We will check orthostatics. -We will obtain a 2D echo as mentioned above. -We will cycle troponin I's. -We will obtain bilateral carotid Doppler.  3.  Essential hypertension. -We will continue her amlodipine and Toprol-XL  4.  Dyslipidemia. -We will continue statin therapy.  5.  DVT prophylaxis. -Subcutaneous Lovenox.    All the records are reviewed and case discussed with ED provider. The plan of care was discussed in details with the patient (and family). I answered all questions. The patient agreed to proceed with the above mentioned plan. Further management will depend upon hospital course.   CODE STATUS: Full code  Status is: Observation  The patient remains OBS appropriate and will d/c before 2 midnights.  Dispo: The patient is from: Home              Anticipated d/c is to: Home              Anticipated d/c date is: 1 day              Patient currently is not medically stable to d/c.   TOTAL TIME TAKING CARE OF THIS PATIENT: 55 minutes.    Christel Mormon M.D on 07/03/2020 at 6:36 PM  [facility]   From 7 PM-7 AM, contact night-coverage [email or web address]  CC: Primary care physician; [facility] [place]

## 2020-07-03 NOTE — ED Notes (Signed)
Patient given ED sandwich tray per Dr. Jacqualine Code.

## 2020-07-04 ENCOUNTER — Observation Stay
Admit: 2020-07-04 | Discharge: 2020-07-04 | Disposition: A | Discharge status: Home / Self Care | Payer: Medicare Other | Attending: Family Medicine | Admitting: Family Medicine

## 2020-07-04 ENCOUNTER — Observation Stay: Payer: Medicare Other

## 2020-07-04 DIAGNOSIS — I4891 Unspecified atrial fibrillation: Secondary | ICD-10-CM | POA: Diagnosis not present

## 2020-07-04 LAB — CBC
HCT: 30.8 % — ABNORMAL LOW (ref 36.0–46.0)
Hemoglobin: 10 g/dL — ABNORMAL LOW (ref 12.0–15.0)
MCH: 28.6 pg (ref 26.0–34.0)
MCHC: 32.5 g/dL (ref 30.0–36.0)
MCV: 88 fL (ref 80.0–100.0)
Platelets: 164 10*3/uL (ref 150–400)
RBC: 3.5 MIL/uL — ABNORMAL LOW (ref 3.87–5.11)
RDW: 21.7 % — ABNORMAL HIGH (ref 11.5–15.5)
WBC: 3 10*3/uL — ABNORMAL LOW (ref 4.0–10.5)
nRBC: 0 % (ref 0.0–0.2)

## 2020-07-04 LAB — BASIC METABOLIC PANEL
Anion gap: 9 (ref 5–15)
BUN: 15 mg/dL (ref 8–23)
CO2: 25 mmol/L (ref 22–32)
Calcium: 8.5 mg/dL — ABNORMAL LOW (ref 8.9–10.3)
Chloride: 108 mmol/L (ref 98–111)
Creatinine, Ser: 0.76 mg/dL (ref 0.44–1.00)
GFR, Estimated: >60 mL/min (ref >60)
Glucose, Bld: 88 mg/dL (ref 70–99)
Potassium: 3.9 mmol/L (ref 3.5–5.1)
Sodium: 142 mmol/L (ref 135–145)

## 2020-07-04 LAB — HIV ANTIBODY (ROUTINE TESTING W REFLEX): HIV Screen 4th Generation wRfx: NONREACTIVE

## 2020-07-04 LAB — ECHOCARDIOGRAM COMPLETE
AR max vel: 2.07 cm2
AV Area VTI: 2.5 cm2
AV Area mean vel: 1.93 cm2
AV Mean grad: 4.7 mmHg
AV Peak grad: 7.5 mmHg
Ao pk vel: 1.37 m/s
Area-P 1/2: 2.6 cm2
Height: 59 in
S' Lateral: 2.52 cm
Weight: 2163.2 oz

## 2020-07-04 MED ORDER — LISINOPRIL 5 MG PO TABS
5.0000 mg | ORAL_TABLET | Freq: Every day | ORAL | 0 refills | Status: DC
Start: 2020-07-04 — End: 2021-05-05

## 2020-07-04 MED ORDER — METOPROLOL SUCCINATE ER 100 MG PO TB24
100.0000 mg | ORAL_TABLET | Freq: Every day | ORAL | 3 refills | Status: DC
Start: 2020-07-05 — End: 2021-08-10

## 2020-07-04 MED ORDER — METOPROLOL SUCCINATE ER 100 MG PO TB24: 100.0000 mg | ORAL_TABLET | Freq: Every day | ORAL | Status: DC

## 2020-07-04 MED ORDER — ASPIRIN 81 MG PO TBEC
81.0000 mg | DELAYED_RELEASE_TABLET | Freq: Every day | ORAL | 11 refills | Status: DC
Start: 2020-07-04 — End: 2021-05-05

## 2020-07-04 MED ORDER — LISINOPRIL 5 MG PO TABS
5.0000 mg | ORAL_TABLET | Freq: Every day | ORAL | Status: DC
  Administered 2020-07-04: 5 mg via ORAL
  Filled 2020-07-04: qty 1

## 2020-07-04 MED ORDER — ASPIRIN EC 81 MG PO TBEC
81.0000 mg | DELAYED_RELEASE_TABLET | Freq: Every day | ORAL | Status: DC
  Administered 2020-07-04: 81 mg via ORAL
  Filled 2020-07-04: qty 1

## 2020-07-04 MED ORDER — ATORVASTATIN CALCIUM 20 MG PO TABS
40.0000 mg | ORAL_TABLET | Freq: Every day | ORAL | Status: DC
  Administered 2020-07-04: 40 mg via ORAL
  Filled 2020-07-04: qty 2

## 2020-07-04 NOTE — Plan of Care (Signed)

## 2020-07-04 NOTE — Progress Notes (Signed)
Report received from ED RN. PT A&O. Pt oriented to unit. Pt stable. Belongings left at bedside.

## 2020-07-04 NOTE — Progress Notes (Signed)
Pt ambulated to BR hr to 157, after returning to bed pt hr returned to 99

## 2020-07-04 NOTE — Plan of Care (Signed)
  Problem: Education: Goal: Knowledge of disease or condition will improve Outcome: Completed/Met Goal: Understanding of medication regimen will improve Outcome: Completed/Met Goal: Individualized Educational Video(s) Outcome: Completed/Met   Problem: Activity: Goal: Ability to tolerate increased activity will improve Outcome: Completed/Met   Problem: Cardiac: Goal: Ability to achieve and maintain adequate cardiopulmonary perfusion will improve Outcome: Completed/Met   Problem: Health Behavior/Discharge Planning: Goal: Ability to safely manage health-related needs after discharge will improve Outcome: Completed/Met   Problem: Education: Goal: Knowledge of General Education information will improve Description: Including pain rating scale, medication(s)/side effects and non-pharmacologic comfort measures Outcome: Completed/Met   Problem: Health Behavior/Discharge Planning: Goal: Ability to manage health-related needs will improve Outcome: Completed/Met   Problem: Clinical Measurements: Goal: Ability to maintain clinical measurements within normal limits will improve Outcome: Completed/Met Goal: Will remain free from infection Outcome: Completed/Met Goal: Diagnostic test results will improve Outcome: Completed/Met Goal: Respiratory complications will improve Outcome: Completed/Met Goal: Cardiovascular complication will be avoided Outcome: Completed/Met   Problem: Activity: Goal: Risk for activity intolerance will decrease Outcome: Completed/Met   Problem: Nutrition: Goal: Adequate nutrition will be maintained Outcome: Completed/Met   Problem: Coping: Goal: Level of anxiety will decrease Outcome: Completed/Met   Problem: Elimination: Goal: Will not experience complications related to bowel motility Outcome: Completed/Met Goal: Will not experience complications related to urinary retention Outcome: Completed/Met   Problem: Pain Managment: Goal: General  experience of comfort will improve Outcome: Completed/Met   Problem: Safety: Goal: Ability to remain free from injury will improve Outcome: Completed/Met   Problem: Skin Integrity: Goal: Risk for impaired skin integrity will decrease Outcome: Completed/Met   
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
## 2020-07-04 NOTE — Discharge Summary (Signed)
Physician Discharge Summary  Kristina Ellis OEH:[id number] DOB: 1951/07/29 DOA: 07/03/2020  PCP: [facility] [place] date: 07/03/2020 Discharge date: 07/04/2020  Time spent: 37 minutes  Recommendations for Outpatient Follow-up:  1. Needs outpatient care coordination at [facility] cardiology 2. Med changes this admission amlodipine DC'd, metoprolol XL increased to 100, lisinopril dose cut back 3. Needs Chem-12, CBC 1 week 4. 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 fib with cardioversion (follows Dr. Syliva Overman) 02/04/2020 on Eliquis metoprolol CHADS2 score >3 on Eliquis, pulmonary hypertension with moderate TR HTN, prior NSTEMI with nuclear stress test 03/13/2020 without reversible ischemia, hepatitis, poor functional status with NYHA class II-III symptoms baseline, concern for OSA needing outpatient sleep testing   Presented from relatives home with acute syncope 3 episodes, lightheadedness and assisted fall as was monitored by family Had not taken medications in 3 days Found to have A. fib RVR 170 range-work-up did not reveal any concerns for intracranial hemorrhage-given Lopressor 50 and then subsequently 1 dose of IV Cardizem and admitted to progressive unit  BNP 314 troponin XX 1 CBC with mild leukopenia and anemia INR 1.2 EKG A. fib RVR  Cardiology Dr. Ubaldo Glassing was consulted and saw the patient in consult and adjusted her medications and increased her metoprolol XL to 100 this morning and felt that she could be seen by [facility] cardiology as an outpatient as there was no real  strong indication that she had decompensated heart failure and that could be considered for acute cardioversion at [facility] if this does not take Needs to follow-up with Dr. Catalina Antigua in the outpatient setting  Patient will be ambulated prior to discharge home and to ensure that the heart rates do not go above 1 20-1 30 while on higher dose of metoprolol  Discharge Exam: Vitals:   07/04/20 0530 07/04/20 0800  BP: (!) 151/105 (!) 175/85  Pulse:  96  Resp:  14  Temp:  98.2 F (36.8 C)  SpO2:  100%    General: EOMI NCAT no focal deficit no icterus no pallor slightly stiff neck Cardiovascular: S1-S2 atrial fibrillation not in RVR Respiratory: Clinically clear no rales no rhonchi no added sound Abdomen-No rebound no guarding of abdomen No le edema Neurologically intact no focal deficit moving all 4 limbs equally  Discharge Instructions   Discharge Instructions    Amb referral to AFIB Clinic   Complete by: As directed    Diet - low sodium heart healthy   Complete by: As directed    Discharge instructions   Complete by: As directed    You will be discharged home on slightly higher doses of some of your rate controlling medications Toprol-XL-please follow instructions on how to take the medications carefully and note that some of the dosages of your other blood pressure medications and other medications have been discontinued Would recommend lab work in about 1 week at primary physician office-please follow-up with [facility] cardiology who is monitored you and done your cardioversion in the past Suggest outpatient follow-up for your neck pain and continue to use the Aspercreme   Increase activity  slowly   Complete by: As directed      Allergies as of 07/04/2020      Reactions   Atenolol Other (See Comments)   bradycardia bradycardia   Penicillins Rash      Medication List    STOP taking these medications   amLODipine 10 MG tablet Commonly known as: NORVASC     TAKE these  medications   aspirin 81 MG EC tablet Take 1 tablet (81 mg total) by mouth daily. Swallow whole.   atorvastatin 40 MG tablet Commonly known as: LIPITOR Take 0.5 tablets (20 mg total) by mouth daily at 6 PM.   Eliquis 5 MG Tabs tablet Generic drug: apixaban Take 5 mg by mouth 2 (two) times daily.   FeroSul 325 (65 FE) MG tablet Generic drug: ferrous sulfate Take 325 mg by mouth daily.   lisinopril 5 MG tablet Commonly known as: ZESTRIL Take 1 tablet (5 mg total) by mouth daily. What changed:   medication strength  how much to take   magnesium oxide 400 MG tablet Commonly known as: MAG-OX Take 1 tablet by mouth 2 (two) times daily.   metoprolol succinate 100 MG 24 hr tablet Commonly known as: TOPROL-XL Take 1 tablet (100 mg total) by mouth daily. Take with or immediately following a meal. Start taking on: July 05, 2020 What changed:   medication strength  how much to take  additional instructions      Allergies  Allergen Reactions  . Atenolol Other (See Comments)    bradycardia bradycardia   . Penicillins Rash    Follow-up Information    Joaquin Bend, MD Follow up in 1 week(s).   Specialty: Internal Medicine Contact information: [address] [place] [address] [phone]                The results of significant diagnostics from this hospitalization (including imaging, microbiology, ancillary and laboratory) are listed below for reference.    Significant Diagnostic Studies: US Carotid Bilateral  Result Date: 07/04/2020 CLINICAL DATA:  Syncope EXAM: BILATERAL CAROTID DUPLEX ULTRASOUND TECHNIQUE: Pearline Cables scale imaging, color Doppler and duplex ultrasound were performed of bilateral carotid and vertebral arteries in the neck. COMPARISON:  None. FINDINGS: Criteria: Quantification of carotid stenosis is based on velocity parameters that correlate the residual internal carotid diameter with NASCET-based stenosis levels, using the  diameter of the distal internal carotid lumen as the denominator for stenosis measurement. The following velocity measurements were obtained: RIGHT ICA: 59/21 cm/sec CCA: 72/62 cm/sec SYSTOLIC ICA/CCA RATIO:  1.7 ECA:  60 cm/sec LEFT ICA: 52/22 cm/sec CCA: 03/55 cm/sec SYSTOLIC ICA/CCA RATIO:  1.0 ECA:  43 cm/sec RIGHT CAROTID ARTERY: Heterogeneous atherosclerotic plaque with areas of calcification in the proximal internal carotid artery. By peak systolic velocity criteria, the estimated stenosis is less than 50%. RIGHT VERTEBRAL ARTERY:  Patent with normal antegrade flow. LEFT CAROTID ARTERY: Mild heterogeneous atherosclerotic plaque in the carotid bifurcation and proximal internal carotid artery. By peak systolic velocity criteria, the estimated stenosis remains less than 50%. LEFT VERTEBRAL ARTERY:  Patent with normal antegrade flow. IMPRESSION: 1. 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 2. Mild (1-49%) stenosis proximal left internal carotid artery secondary to heterogenous atherosclerotic plaque. 3. Vertebral arteries are patent with normal antegrade flow. Signed, Criselda Peaches, MD, [facility] Electronically Signed   By: Jacqulynn Cadet M.D.   On: 07/04/2020 08:12   DG Chest [facility]  1 View  Result Date: 07/03/2020 CLINICAL DATA:  Loss of consciousness, 3 syncopal episodes, history of atrial fibrillation, not taking medications for 2 days EXAM: PORTABLE CHEST 1 VIEW COMPARISON:  Portable exam 1604 hours compared to 07/26/2016 FINDINGS: Normal heart size, mediastinal contours, and pulmonary vascularity. Mitral annular calcification. Atherosclerotic calcification aorta. Lungs clear. No pulmonary infiltrate, pleural effusion, or pneumothorax. Advanced BILATERAL glenohumeral degenerative changes. IMPRESSION: No acute abnormalities. Electronically Signed   By: Lavonia Dana  M.D.   On: 07/03/2020 16:20   ECHOCARDIOGRAM COMPLETE  Result Date: 07/04/2020    ECHOCARDIOGRAM REPORT   Patient Name:   Kristina Ellis Date of Exam: 07/04/2020 Medical Rec #:  [id number]        Height:       59.0 in Accession #:    [id number]       Weight:       135.2 lb Date of Birth:  06/16/1951         BSA:          1.562 m Patient Age:    69 years         BP:           151/105 mmHg Patient Gender: F                HR:           86 bpm. Exam Location:  [facility] Procedure: 2D Echo, Cardiac Doppler and Color Doppler Indications:     Atrial Fibrillation 427.31  History:         Patient has no prior history of Echocardiogram examinations.                  Arrythmias:Atrial Fibrillation; Risk Factors:Hypertension.  Sonographer:     Sherrie Sport RDCS (AE) Referring Phys:  [id number] Arvella Merles MANSY Diagnosing Phys: Bartholome Bill MD IMPRESSIONS  1. Left ventricular ejection fraction, by estimation, is 60 to 65%. Left ventricular ejection fraction by PLAX is 70 %. The left ventricle has normal function. The left ventricle has no regional wall motion abnormalities. Left ventricular diastolic parameters are consistent with Grade I diastolic dysfunction (impaired relaxation).  2. Right ventricular systolic function was not well visualized. The right ventricular size is normal.  3. Left atrial size was mild to moderately dilated.  4. The mitral valve is degenerative. Trivial mitral valve regurgitation. Mild mitral stenosis.  5. The aortic valve was not well visualized. Aortic valve regurgitation is not visualized. FINDINGS  Left Ventricle: Left ventricular ejection fraction, by estimation, is 60 to 65%. Left ventricular ejection fraction by PLAX is 70 %. The left ventricle has normal function. The left ventricle has no regional wall motion abnormalities. The left ventricular internal cavity size was normal in size. There is borderline left ventricular hypertrophy. Left ventricular diastolic parameters are consistent with Grade I  diastolic dysfunction (impaired relaxation). Right Ventricle: The right ventricular size is normal. No increase in right ventricular wall thickness. Right ventricular systolic function was not well visualized. Left Atrium: Left atrial size was mild to moderately dilated. Right Atrium: Right atrial size was normal in size. Pericardium: There is no evidence of pericardial effusion. Mitral Valve: The mitral valve is degenerative in appearance. Trivial mitral valve regurgitation. Mild mitral valve stenosis. MV peak gradient, 13.7 mmHg. The mean mitral valve gradient is 5.0 mmHg. Tricuspid Valve: The tricuspid valve is not well visualized. Tricuspid valve regurgitation is mild . No evidence of tricuspid stenosis. Aortic Valve: The aortic valve was not  well visualized. Aortic valve regurgitation is not visualized. Aortic valve mean gradient measures 4.7 mmHg. Aortic valve peak gradient measures 7.5 mmHg. Aortic valve area, by VTI measures 2.50 cm. Pulmonic Valve: The pulmonic valve was not well visualized. Pulmonic valve regurgitation is trivial. Aorta: The aortic root is normal in size and structure. IAS/Shunts: The atrial septum is grossly normal.  LEFT VENTRICLE PLAX 2D LV EF:         Left ventricular ejection fraction by PLAX is 70 %. LVIDd:         4.11 cm LVIDs:         2.52 cm LV PW:         1.11 cm LV IVS:        0.87 cm LVOT diam:     2.00 cm LV SV:         65 LV SV Index:   42 LVOT Area:     3.14 cm  RIGHT VENTRICLE RV Basal diam:  2.71 cm RV S prime:     8.70 cm/s TAPSE (M-mode): 3.5 cm LEFT ATRIUM              Index       RIGHT ATRIUM           Index LA diam:        5.30 cm  3.39 cm/m  RA Area:     16.20 cm LA Vol (A2C):   129.0 ml 82.61 ml/m RA Volume:   40.30 ml  25.81 ml/m LA Vol (A4C):   138.0 ml 88.37 ml/m LA Biplane Vol: 135.0 ml 86.45 ml/m  AORTIC VALVE                   PULMONIC VALVE AV Area (Vmax):    2.07 cm    PV Vmax:        0.87 m/s AV Area (Vmean):   1.93 cm    PV Peak grad:   3.0 mmHg  AV Area (VTI):     2.50 cm    RVOT Peak grad: 4 mmHg AV Vmax:           137.00 cm/s AV Vmean:          99.833 cm/s AV VTI:            0.260 m AV Peak Grad:      7.5 mmHg AV Mean Grad:      4.7 mmHg LVOT Vmax:         90.20 cm/s LVOT Vmean:        61.400 cm/s LVOT VTI:          0.207 m LVOT/AV VTI ratio: 0.80  AORTA Ao Root diam: 2.60 cm MITRAL VALVE                TRICUSPID VALVE MV Area (PHT): 2.60 cm     TR Peak grad:   26.0 mmHg MV Peak grad:  13.7 mmHg    TR Vmax:        255.00 cm/s MV Mean grad:  5.0 mmHg MV Vmax:       1.85 m/s     SHUNTS MV Vmean:      99.0 cm/s    Systemic VTI:  0.21 m MV Decel Time: 292 msec     Systemic Diam: 2.00 cm MV E velocity: 165.00 cm/s Bartholome Bill MD Electronically signed by Bartholome Bill MD Signature Date/Time: 07/04/2020/9:53:53 AM    Final     Microbiology: Recent  Results (from the past 240 hour(s))  Resp Panel by RT-PCR (Flu A&B, Covid) Nasopharyngeal Swab     Status: None   Collection Time: 07/03/20  4:54 PM   Specimen: Nasopharyngeal Swab; Nasopharyngeal(NP) swabs in vial transport medium  Result Value Ref Range Status   SARS Coronavirus 2 by RT PCR NEGATIVE NEGATIVE Final    Comment: (NOTE) SARS-CoV-2 target nucleic acids are NOT DETECTED.  The SARS-CoV-2 RNA is generally detectable in upper respiratory specimens during the acute phase of infection. The lowest concentration of SARS-CoV-2 viral copies this assay can detect is 138 copies/mL. A negative result does not preclude SARS-Cov-2 infection and should not be used as the sole basis for treatment or other patient management decisions. A negative result may occur with  improper specimen collection/handling, submission of specimen other than nasopharyngeal swab, presence of viral mutation(s) within the areas targeted by this assay, and inadequate number of viral copies(<138 copies/mL). A negative result must be combined with clinical observations, patient history, and epidemiological information.  The expected result is Negative.  Fact Sheet for Patients:  EntrepreneurPulse.com.au  Fact Sheet for Healthcare Providers:  IncredibleEmployment.be  This test is no t yet approved or cleared by the [place] FDA and  has been authorized for detection and/or diagnosis of SARS-CoV-2 by FDA under an Emergency Use Authorization (EUA). This EUA will remain  in effect (meaning this test can be used) for the duration of the COVID-19 declaration under Section 564(b)(1) of the Act, 21 U.S.C.section 360bbb-3(b)(1), unless the authorization is terminated  or revoked sooner.       Influenza A by PCR NEGATIVE NEGATIVE Final   Influenza B by PCR NEGATIVE NEGATIVE Final    Comment: (NOTE) The Xpert Xpress SARS-CoV-2/FLU/RSV plus assay is intended as an aid in the diagnosis of influenza from Nasopharyngeal swab specimens and should not be used as a sole basis for treatment. Nasal washings and aspirates are unacceptable for Xpert Xpress SARS-CoV-2/FLU/RSV testing.  Fact Sheet for Patients: EntrepreneurPulse.com.au  Fact Sheet for Healthcare Providers: IncredibleEmployment.be  This test is not yet approved or cleared by the [place] FDA and has been authorized for detection and/or diagnosis of SARS-CoV-2 by FDA under an Emergency Use Authorization (EUA). This EUA will remain in effect (meaning this test can be used) for the duration of the COVID-19 declaration under Section 564(b)(1) of the Act, 21 U.S.C. section 360bbb-3(b)(1), unless the authorization is terminated or revoked.  Performed at [facility], [address] [place], [place] [address]      Labs: Basic Metabolic Panel: Recent Labs  Lab 07/03/20 1602 07/03/20 2115 07/04/20 0513  NA 140  --  142  K 4.0  --  3.9  CL 110  --  108  CO2 19*  --  25  GLUCOSE 128*  --  88  BUN 14  --  15  CREATININE 0.94  --  0.76  CALCIUM 8.8*  --   8.5*  MG  --  1.7  --    Liver Function Tests: No results for input(s): AST, ALT, ALKPHOS, BILITOT, PROT, ALBUMIN in the last 168 hours. No results for input(s): LIPASE, AMYLASE in the last 168 hours. No results for input(s): AMMONIA in the last 168 hours. CBC: Recent Labs  Lab 07/03/20 1602 07/04/20 0513  WBC 3.6* 3.0*  NEUTROABS 1.6*  --   HGB 10.0* 10.0*  HCT 32.8* 30.8*  MCV 90.6 88.0  PLT 164 164   Cardiac Enzymes: No results for input(s): CKTOTAL,  CKMB, CKMBINDEX, TROPONINI in the last 168 hours. BNP: BNP (last 3 results) Recent Labs    07/03/20 1602  BNP 314.4*    ProBNP (last 3 results) No results for input(s): PROBNP in the last 8760 hours.  CBG: No results for input(s): GLUCAP in the last 168 hours.     Signed:  Nita Sells MD   [facility] 07/04/2020, 10:28 AM

## 2020-07-04 NOTE — Progress Notes (Signed)
*  PRELIMINARY RESULTS* Echocardiogram 2D Echocardiogram has been performed.  Sherrie Sport 07/04/2020, 9:18 AM

## 2020-12-16 ENCOUNTER — Other Ambulatory Visit: Payer: Self-pay | Admitting: Neurology

## 2020-12-16 DIAGNOSIS — R202 Paresthesia of skin: Secondary | ICD-10-CM

## 2020-12-18 ENCOUNTER — Other Ambulatory Visit: Payer: Self-pay

## 2020-12-18 ENCOUNTER — Ambulatory Visit
Admission: RE | Admit: 2020-12-18 | Discharge: 2020-12-18 | Disposition: A | Discharge status: Home / Self Care | Payer: Medicare Other | Source: Ambulatory Visit | Attending: Neurology | Admitting: Neurology

## 2020-12-18 DIAGNOSIS — R202 Paresthesia of skin: Secondary | ICD-10-CM | POA: Insufficient documentation

## 2021-02-19 ENCOUNTER — Other Ambulatory Visit: Payer: Self-pay | Admitting: Family Medicine

## 2021-02-19 DIAGNOSIS — Z0000 Encounter for general adult medical examination without abnormal findings: Secondary | ICD-10-CM

## 2021-02-19 DIAGNOSIS — Z1231 Encounter for screening mammogram for malignant neoplasm of breast: Secondary | ICD-10-CM

## 2021-03-23 ENCOUNTER — Encounter: Payer: Self-pay | Admitting: Urology

## 2021-03-23 ENCOUNTER — Ambulatory Visit (INDEPENDENT_AMBULATORY_CARE_PROVIDER_SITE_OTHER): Payer: Medicare Other | Admitting: Urology

## 2021-03-23 ENCOUNTER — Other Ambulatory Visit: Payer: Self-pay

## 2021-03-23 VITALS — BP 120/80 | HR 76 | Ht 59.0 in | Wt 134.0 lb

## 2021-03-23 DIAGNOSIS — R300 Dysuria: Secondary | ICD-10-CM | POA: Diagnosis not present

## 2021-03-23 DIAGNOSIS — R310 Gross hematuria: Secondary | ICD-10-CM

## 2021-03-23 LAB — URINALYSIS, COMPLETE
Bilirubin, UA: NEGATIVE
Nitrite, UA: POSITIVE — AB
Specific Gravity, UA: 1.02 (ref 1.005–1.030)
Urobilinogen, Ur: >8 mg/dL — ABNORMAL HIGH (ref 0.2–1.0)
pH, UA: 6.5 (ref 5.0–7.5)

## 2021-03-23 LAB — MICROSCOPIC EXAMINATION: RBC, Urine: >30 /hpf — AB (ref 0–2)

## 2021-03-23 NOTE — Progress Notes (Signed)
   03/23/21 2:15 PM   Kristina Ellis May 17, 1951 [id number]  CC: Gross hematuria, dysuria  HPI: I saw [name] today for gross hematuria.  She is a 70 year old female with Eliquis for atrial fibrillation who reports 2 to 3 months of gross hematuria, and small clots.  She is also had some burning with urination for 4 to 5 months.  Recent urine culture showed no growth, and she was treated with Cipro without any change in her symptoms.  She has a minimal smoking history.  Urinalysis today with 0-5 WBCs, greater than 30 RBCs, few bacteria, no yeast, 3+ leukocytes, nitrite positive.  Will send for culture and atypicals.   PMH: Past Medical History:  Diagnosis Date   Atrial fibrillation ([place])    Hepatitis    Hypertension     Surgical History: Past Surgical History:  Procedure Laterality Date   BREAST CYST ASPIRATION Left     Family History: Family History  Problem Relation Age of Onset   Aneurysm Mother    Colon cancer Father    Lung cancer Brother     Social History:  reports that she has quit smoking. Her smoking use included cigarettes. She has a 1.00 pack-year smoking history. She has never used smokeless tobacco. She reports current alcohol use of about 5.0 standard drinks per week. She reports that she does not currently use drugs.  Physical Exam: BP 120/80   Pulse 76   Ht '4\' 11"'$  (1.499 m)   Wt 134 lb (60.8 kg)   BMI 27.06 kg/m    Constitutional:  Alert and oriented, No acute distress. Cardiovascular: No clubbing, cyanosis, or edema. Respiratory: Normal respiratory effort, no increased work of breathing. GI: Abdomen is soft, nontender, nondistended, no abdominal masses  Laboratory Data: Reviewed, see HPI  Pertinent Imaging: None to review  Assessment & Plan:  70 year old female with 2 to 3 months of gross hematuria of unclear etiology, urinalysis today with persistent microscopic hematuria but no WBCs or significant bacterial burden  We discussed common  possible etiologies of hematuria including malignancy, urolithiasis, medical renal disease, and idiopathic. Standard workup recommended by the AUA includes imaging with CT urogram to assess the upper tracts, and cystoscopy. Cytology is performed on patient's with gross hematuria to look for malignant cells in the urine.  Follow-up urine culture Schedule CT urogram and cystoscopy   Nickolas Madrid, MD 03/23/2021  [facility] [address], [place], [place] [address] [phone]

## 2021-03-23 NOTE — Patient Instructions (Signed)
Cystoscopy Cystoscopy is a procedure that is used to help diagnose and sometimes treat conditions that affect the lower urinary tract. The lower urinary tract includes the bladder and the urethra. The urethra is the tube that drains urine from the bladder. Cystoscopy is done using a thin, tube-shaped instrument with a light and camera at the end (cystoscope). The cystoscope may be hard or flexible, depending on the goal of theprocedure. The cystoscope is inserted through the urethra, into the bladder. Cystoscopy may be recommended if you have: Urinary tract infections that keep coming back. Blood in the urine (hematuria). An inability to control when you urinate (urinary incontinence) or an overactive bladder. Unusual cells found in a urine sample. A blockage in the urethra, such as a urinary stone. Painful urination. An abnormality in the bladder found during an intravenous pyelogram (IVP) or CT scan. Cystoscopy may also be done to remove a sample of tissue to be examined under a microscope (biopsy). Tell a health care provider about: Any allergies you have. All medicines you are taking, including vitamins, herbs, eye drops, creams, and over-the-counter medicines. Any problems you or family members have had with anesthetic medicines. Any blood disorders you have. Any surgeries you have had. Any medical conditions you have. Whether you are pregnant or may be pregnant. What are the risks? Generally, this is a safe procedure. However, problems may occur, including: Infection. Bleeding. Allergic reactions to medicines. Damage to other structures or organs. What happens before the procedure? Medicines Ask your health care provider about: Changing or stopping your regular medicines. This is especially important if you are taking diabetes medicines or blood thinners. Taking medicines such as aspirin and ibuprofen. These medicines can thin your blood. Do not take these medicines unless your  health care provider tells you to take them. Taking over-the-counter medicines, vitamins, herbs, and supplements. Tests You may have an exam or testing, such as: X-rays of the bladder, urethra, or kidneys. CT scan of the abdomen or pelvis. Urine tests to check for signs of infection. General instructions Follow instructions from your health care provider about eating or drinking restrictions. Ask your health care provider what steps will be taken to help prevent infection. These steps may include: Washing skin with a germ-killing soap. Taking antibiotic medicine. Plan to have a responsible adult take you home from the hospital or clinic. What happens during the procedure?  You will be given one or more of the following: A medicine to help you relax (sedative). A medicine to numb the area (local anesthetic). The area around the opening of your urethra will be cleaned. The cystoscope will be passed through your urethra into your bladder. Germ-free (sterile) fluid will flow through the cystoscope to fill your bladder. The fluid will stretch your bladder so that your health care provider can clearly examine your bladder walls. Your doctor will look at the urethra and bladder. Your doctor may take a biopsy or remove stones. The cystoscope will be removed, and your bladder will be emptied. The procedure may vary among health care providers and hospitals. What can I expect after the procedure? After the procedure, it is common to have: Some soreness or pain in your abdomen and urethra. Urinary symptoms. These include: Mild pain or burning when you urinate. Pain should stop within a few minutes after you urinate. This may last for up to 1 week. A small amount of blood in your urine for several days. Feeling like you need to urinate but producing only a  small amount of urine. Follow these instructions at home: Medicines Take over-the-counter and prescription medicines only as told by your  health care provider. If you were prescribed an antibiotic medicine, take it as told by your health care provider. Do not stop taking the antibiotic even if you start to feel better. General instructions Return to your normal activities as told by your health care provider. Ask your health care provider what activities are safe for you. If you were given a sedative during the procedure, it can affect you for several hours. Do not drive or operate machinery until your health care provider says that it is safe. Watch for any blood in your urine. If the amount of blood in your urine increases, call your health care provider. Follow instructions from your health care provider about eating or drinking restrictions. If a tissue sample was removed for testing (biopsy) during your procedure, it is up to you to get your test results. Ask your health care provider, or the department that is doing the test, when your results will be ready. Drink enough fluid to keep your urine pale yellow. Keep all follow-up visits. This is important. Contact a health care provider if: You have pain that gets worse or does not get better with medicine, especially pain when you urinate. You have trouble urinating. You have more blood in your urine. Get help right away if: You have blood clots in your urine. You have abdominal pain. You have a fever or chills. You are unable to urinate. Summary Cystoscopy is a procedure that is used to help diagnose and sometimes treat conditions that affect the lower urinary tract. Cystoscopy is done using a thin, tube-shaped instrument with a light and camera at the end. After the procedure, it is common to have some soreness or pain in your abdomen and urethra. Watch for any blood in your urine. If the amount of blood in your urine increases, call your health care provider. If you were prescribed an antibiotic medicine, take it as told by your health care provider. Do not stop taking  the antibiotic even if you start to feel better. This information is not intended to replace advice given to you by your health care provider. Make sure you discuss any questions you have with your healthcare provider. Document Revised: 03/07/2020 Document Reviewed: 03/07/2020 Elsevier Patient Education  [place].

## 2021-03-26 ENCOUNTER — Telehealth: Payer: Self-pay

## 2021-03-26 ENCOUNTER — Other Ambulatory Visit: Payer: Self-pay

## 2021-03-26 ENCOUNTER — Ambulatory Visit
Admission: RE | Admit: 2021-03-26 | Discharge: 2021-03-26 | Disposition: A | Discharge status: Home / Self Care | Payer: Medicare Other | Source: Ambulatory Visit | Attending: Family Medicine | Admitting: Family Medicine

## 2021-03-26 DIAGNOSIS — Z13820 Encounter for screening for osteoporosis: Secondary | ICD-10-CM | POA: Insufficient documentation

## 2021-03-26 DIAGNOSIS — Z780 Asymptomatic menopausal state: Secondary | ICD-10-CM | POA: Diagnosis not present

## 2021-03-26 DIAGNOSIS — Z1231 Encounter for screening mammogram for malignant neoplasm of breast: Secondary | ICD-10-CM | POA: Insufficient documentation

## 2021-03-26 DIAGNOSIS — Z2239 Carrier of other specified bacterial diseases: Secondary | ICD-10-CM

## 2021-03-26 DIAGNOSIS — M419 Scoliosis, unspecified: Secondary | ICD-10-CM | POA: Insufficient documentation

## 2021-03-26 DIAGNOSIS — Z0000 Encounter for general adult medical examination without abnormal findings: Secondary | ICD-10-CM

## 2021-03-26 LAB — CULTURE, URINE COMPREHENSIVE

## 2021-03-26 MED ORDER — DOXYCYCLINE HYCLATE 100 MG PO CAPS: 100.0000 mg | ORAL_CAPSULE | Freq: Two times a day (BID) | ORAL | 0 refills | Status: AC

## 2021-03-26 NOTE — Telephone Encounter (Signed)
-----   Message from Billey Co, MD sent at 03/26/2021  1:54 PM EDT ----- Recommend doxycycline 100 mg twice daily x7 days for Ureaplasma atypical UTI.  She should keep follow-up as scheduled for cystoscopy  Nickolas Madrid, MD 03/26/2021

## 2021-03-26 NOTE — Telephone Encounter (Signed)
Called pt informed her of the information below. Pt gave verbal understanding. RX sent in.  

## 2021-03-31 LAB — MYCOPLASMA / UREAPLASMA CULTURE
Mycoplasma hominis Culture: NEGATIVE
Ureaplasma urealyticum: POSITIVE — AB

## 2021-04-08 ENCOUNTER — Ambulatory Visit
Admission: RE | Admit: 2021-04-08 | Discharge: 2021-04-08 | Disposition: A | Discharge status: Home / Self Care | Payer: Medicare Other | Source: Ambulatory Visit | Attending: Urology | Admitting: Urology

## 2021-04-08 DIAGNOSIS — R310 Gross hematuria: Secondary | ICD-10-CM | POA: Insufficient documentation

## 2021-04-08 LAB — POCT I-STAT CREATININE: Creatinine, Ser: 0.8 mg/dL (ref 0.44–1.00)

## 2021-04-08 MED ORDER — IOHEXOL 350 MG/ML SOLN
75.0000 mL | Freq: Once | INTRAVENOUS | Status: AC | PRN
  Administered 2021-04-08: 75 mL via INTRAVENOUS

## 2021-04-15 ENCOUNTER — Ambulatory Visit: Payer: Medicare Other | Admitting: Urology

## 2021-04-16 ENCOUNTER — Ambulatory Visit (INDEPENDENT_AMBULATORY_CARE_PROVIDER_SITE_OTHER): Payer: Medicare Other | Admitting: Urology

## 2021-04-16 ENCOUNTER — Encounter: Payer: Self-pay | Admitting: Urology

## 2021-04-16 ENCOUNTER — Other Ambulatory Visit: Payer: Self-pay

## 2021-04-16 VITALS — BP 150/91 | HR 87 | Ht 59.0 in | Wt 134.0 lb

## 2021-04-16 DIAGNOSIS — R310 Gross hematuria: Secondary | ICD-10-CM

## 2021-04-16 DIAGNOSIS — D494 Neoplasm of unspecified behavior of bladder: Secondary | ICD-10-CM

## 2021-04-16 NOTE — Patient Instructions (Signed)
Transurethral Resection of Bladder Tumor Transurethral resection of a bladder tumor is the removal (resection) of a cancerous growth (tumor) on the inside wall of the bladder. The bladder is the organ that holds urine. The tumor is removed through the tube that carries urine out of the body (urethra). In a transurethral resection, a thin telescope with a light, a tiny camera, and an electric cutting edge (resectoscope) is passed through the urethra. In men, the opening of the urethra is at the end of the penis. In women, it is just above the opening of the vagina. Tell a health care provider about: Any allergies you have. All medicines you are taking, including vitamins, herbs, eye drops, creams, and over-the-counter medicines. Any problems you or family members have had with anesthetic medicines. Any blood disorders you have. Any surgeries you have had. Any medical conditions you have. Any recent urinary tract infections you have had. Whether you are pregnant or may be pregnant. What are the risks? Generally, this is a safe procedure. However, problems may occur, including: Infection. Bleeding. Allergic reactions to medicines. Damage to nearby structures or organs, such as: The urethra. The tubes that drain urine from the kidneys into the bladder (ureters). Pain and burning during urination. Difficulty urinating due to partial blockage of the urethra. Inability to urinate (urinary retention). What happens before the procedure? Staying hydrated Follow instructions from your health care provider about hydration, which may include: Up to 2 hours before the procedure - you may continue to drink clear liquids, such as water, clear fruit juice, black coffee, and plain tea.  Eating and drinking restrictions Follow instructions from your health care provider about eating and drinking, which may include: 8 hours before the procedure - stop eating heavy meals or foods, such as meat, fried foods,  or fatty foods. 6 hours before the procedure - stop eating light meals or foods, such as toast or cereal. 6 hours before the procedure - stop drinking milk or drinks that contain milk. 2 hours before the procedure - stop drinking clear liquids. Medicines Ask your health care provider about: Changing or stopping your regular medicines. This is especially important if you are taking diabetes medicines or blood thinners. Taking medicines such as aspirin and ibuprofen. These medicines can thin your blood. Do not take these medicines unless your health care provider tells you to take them. Taking over-the-counter medicines, vitamins, herbs, and supplements. Tests You may have exams or tests, including: Physical exam. Blood tests. Urine tests. Electrocardiogram (ECG). This test measures the electrical activity of the heart. General instructions Plan to have someone take you home from the hospital or clinic. Ask your health care provider how your surgical site will be marked or identified. Ask your health care provider what steps will be taken to help prevent infection. These may include: Washing skin with a germ-killing soap. Taking antibiotic medicine. What happens during the procedure? An IV will be inserted into one of your veins. You will be given one or more of the following: A medicine to help you relax (sedative). A medicine to make you fall asleep (general anesthetic). A medicine that is injected into your spine to numb the area below and slightly above the injection site (spinal anesthetic). Your legs will be placed in foot rests (stirrups) so that your legs are apart and your knees are bent. The resectoscope will be passed through your urethra and into your bladder. The part of your bladder that is affected by the tumor will be  resected using the cutting edge of the resectoscope. The resectoscope will be removed. A thin, flexible tube (catheter) will be passed through your urethra  and into your bladder. The catheter will drain urine into a bag outside of your body. Fluid may be passed through the catheter to keep the catheter open. The procedure may vary among health care providers and hospitals. What happens after the procedure? Your blood pressure, heart rate, breathing rate, and blood oxygen level will be monitored until you leave the hospital or clinic. You may continue to receive fluids and medicines through an IV. You will have some pain. You will be given pain medicine to relieve pain. You will have a catheter to drain your urine. You will have blood in your urine. Your catheter may be kept in until your urine is clear. The amount of urine will be monitored. If necessary, your bladder may be rinsed out (irrigated) by passing fluid through your catheter. You will be encouraged to walk around as soon as possible. You may have to wear compression stockings. These stockings help to prevent blood clots and reduce swelling in your legs. Do not drive for 24 hours if you were given a sedative during your procedure. Summary Transurethral resection of a bladder tumor is the removal (resection) of a cancerous growth (tumor) on the inside wall of the bladder. To do this procedure, your health care provider uses a thin telescope with a light, a tiny camera, and an electric cutting edge (resectoscope). Follow your health care provider's instructions. You may need to stop or change certain medicines, and you may be told to stop eating and drinking several hours before the procedure. Your blood pressure, heart rate, breathing rate, and blood oxygen level will be monitored until you leave the hospital or clinic. You may have to wear compression stockings. These stockings help to prevent blood clots and reduce swelling in your legs. This information is not intended to replace advice given to you by your health care provider. Make sure you discuss any questions you have with your  health care provider. Document Revised: 02/24/2018 Document Reviewed: 02/24/2018 Elsevier Patient Education  [place].  Transurethral Resection of Bladder Tumor, Care After This sheet gives you information about how to care for yourself after your procedure. Your health care provider may also give you more specific instructions. If you have problems or questions, contact your health care provider. What can I expect after the procedure? After the procedure, it is common to have: A small amount of blood in your urine for up to 2 weeks. Soreness or mild pain from your catheter. After your catheter is removed, you may have mild soreness, especially when urinating. Pain in your lower abdomen. Follow these instructions at home: Medicines  Take over-the-counter and prescription medicines only as told by your health care provider. If you were prescribed an antibiotic medicine, take it as told by your health care provider. Do not stop taking the antibiotic even if you start to feel better. Do not drive for 24 hours if you were given a sedative during your procedure. Ask your health care provider if the medicine prescribed to you: Requires you to avoid driving or using heavy machinery. Can cause constipation. You may need to take these actions to prevent or treat constipation: Take over-the-counter or prescription medicines. Eat foods that are high in fiber, such as beans, whole grains, and fresh fruits and vegetables. Limit foods that are high in fat and processed sugars, such as  fried or sweet foods. Activity Return to your normal activities as told by your health care provider. Ask your health care provider what activities are safe for you. Do not lift anything that is heavier than 10 lb (4.5 kg), or the limit that you are told, until your health care provider says that it is safe. Avoid intense physical activity for as long as told by your health care provider. Rest as told by your  health care provider. Avoid sitting for a long time without moving. Get up to take short walks every 1-2 hours. This is important to improve blood flow and breathing. Ask for help if you feel weak or unsteady. General instructions  Do not drink alcohol for as long as told by your health care provider. This is especially important if you are taking prescription pain medicines. Do not take baths, swim, or use a hot tub until your health care provider approves. Ask your health care provider if you may take showers. You may only be allowed to take sponge baths. If you have a catheter, follow instructions from your health care provider about caring for your catheter and your drainage bag. Drink enough fluid to keep your urine pale yellow. Wear compression stockings as told by your health care provider. These stockings help to prevent blood clots and reduce swelling in your legs. Keep all follow-up visits as told by your health care provider. This is important. You will need to be followed closely with regular checks of your bladder and urethra (cystoscopies) to make sure that the cancer does not come back. Contact a health care provider if: You have pain that gets worse or does not improve with medicine. You have blood in your urine for more than 2 weeks. You have cloudy or bad-smelling urine. You become constipated. Signs of constipation may include having: Fewer than three bowel movements in a week. Difficulty having a bowel movement. Stools that are dry, hard, or larger than normal. You have a fever. Get help right away if: You have: Severe pain. Bright red blood in your urine. Blood clots in your urine. A lot of blood in your urine. Your catheter has been removed and you are not able to urinate. You have a catheter in place and the catheter is not draining urine. Summary After your procedure, it is common to have a small amount of blood in your urine, soreness or mild pain from your  catheter, and pain in your lower abdomen. Take over-the-counter and prescription medicines only as told by your health care provider. Rest as told by your health care provider. Follow your health care provider's instructions about returning to normal activities. Ask what activities are safe for you. If you have a catheter, follow instructions from your health care provider about caring for your catheter and your drainage bag. Get help right away if you cannot urinate, you have severe pain, or you have bright red blood or blood clots in your urine. This information is not intended to replace advice given to you by your health care provider. Make sure you discuss any questions you have with your health care provider. Document Revised: 02/23/2018 Document Reviewed: 02/23/2018 Elsevier Patient Education  [place].

## 2021-04-16 NOTE — Progress Notes (Signed)
   04/16/2021 4:09 PM   [name] 20-Aug-1950 [id number]  Reason for visit: Follow up gross hematuria, discuss CT results  HPI: 70 year old female on Eliquis for atrial fibrillation who was referred for 2 to 3 months of gross hematuria and small clots, as well as dysuria.  Urine culture did ultimately grow Ureaplasma, but she had no improvement in her dysuria on doxycycline.  With her significant hematuria we still performed CT, and unfortunately this showed a 3 cm mass within the bladder consistent with bladder cancer.  No obvious evidence of metastatic disease, no hydronephrosis.  We discussed these findings at length, and likely new diagnosis of bladder cancer.  We discussed transurethral resection of bladder tumor (TURBT) and risks and benefits at length. This is typically a 1 to 2-hour procedure done under general anesthesia in the operating room.  A scope is inserted through the urethra and used to resect abnormal tissue within the bladder, which is then sent to the pathologist to determine grade and stage of the tumor.  Risks include bleeding, infection, need for temporary Foley placement, and bladder perforation.  Treatment strategies are based on the type of tumor and depth of invasion.  We briefly reviewed the different treatment pathways for non-muscle invasive and muscle invasive bladder cancer.  Clearance needed to stop Eliquis Schedule TURBT   Billey Co, MD  [facility] [address] [place], [place] [address] [phone]

## 2021-04-22 ENCOUNTER — Other Ambulatory Visit: Payer: Self-pay

## 2021-04-22 ENCOUNTER — Encounter: Payer: Self-pay | Admitting: Urgent Care

## 2021-04-22 ENCOUNTER — Telehealth: Payer: Self-pay | Admitting: Urology

## 2021-04-22 ENCOUNTER — Encounter: Payer: Self-pay | Admitting: Urology

## 2021-04-22 DIAGNOSIS — D494 Neoplasm of unspecified behavior of bladder: Secondary | ICD-10-CM

## 2021-04-22 NOTE — Progress Notes (Signed)
[facility] Posting Form   Surgery Date/Time: Date: 05/15/2021  Surgeon: Dr. Nickolas Madrid, MD  Surgery Location: Day Surgery  Inpt ( No  )   Outpt (Yes)   Obs ( No  )   Diagnosis: D49.4 Bladder Tumor  -CPT: [id number]  Surgery: TURBT  Stop Anticoagulations: Yes  would hold aspirin as well, ok to continue '81mg'$  asa if cardiology requests  Cardiac/Medical/Pulmonary Clearance needed: Yes, Cardiology and PCP  Clearance needed from Dr: Kennith Center, Cardiology; [facility] (PCP)  Clearance request sent on: Date: 04/22/21   *Orders entered into EPIC  Date: 04/22/21   *Case booked in EPIC  Date: 04/22/21  *Notified pt of Surgery: Date: 04/22/21  PRE-OP UA & CX: Yes  *Placed into Prior Authorization Work Que Date: 04/22/21   Assistant/laser/rep:No

## 2021-04-22 NOTE — Telephone Encounter (Signed)
Per Dr. Diamantina Providence Patient is to be scheduled for TURBT  Kristina Ellis was contacted and possible surgical dates were discussed, 05/15/21 was agreed upon for surgery. Patient was instructed that Dr. Diamantina Providence will require them to provide a pre-op UA & CX prior to surgery. This was ordered and scheduled drop off appointment was made for 05/04/21 '@11'$ :00.   Patient was directed to call [phone] between 1-3pm the day before surgery to find out surgical arrival time.  Instructions were given not to eat or drink from midnight on the night before surgery and have a driver for the day of surgery. On the surgery day patient was instructed to enter through the [place] entrance of [facility] report the Same Day Surgery desk.   Pre-Admit Testing will be in contact via phone to set up an interview with the anesthesia team to review your history and medications prior to surgery.   Reminder of this information was sent via mail to the patient.   Patient is to hold anticoag's per Dr. Diamantina Providence.  Currently taking Eliquis And ASA 81 mg

## 2021-04-22 NOTE — Progress Notes (Signed)
Surgical Physician Order Form  ** Scheduling expectation (ASAP, 1-2wk, next available, etc):  October  *Length of Case: 2 hours  *Clearance needed: yes, PCP, Cardiology  *Anticoagulation Instructions: Hold Eliquis  *Aspirin Instructions: would hold aspirin as well, ok to continue '81mg'$  asa if cardiology requests  *Post-op visit Date/Instructions: 1-2 weeks to discuss pathology  *Diagnosis: D49.4 Bladder Tumor  *Procedure: [id number]   -Admit type: Outpatient  YES Other: ___  -Anesthesia: general  -VTE Prophylaxis Standing Order SCD's       Other: __________  -Standing Lab Orders Per Anesthesia    Lab other: _______   UA&Urine Culture [Yes]   -Standing Test orders EKG/Chest x-ray per Anesthesia       Test other: ______   - Medications:       Ancef 2g IV  [YES]  Gentamicin __ mg IV or per pharmacy '[]'$     Gemcitabine '2000mg'$  bladder instillation '[]'$      Other:__________

## 2021-04-22 NOTE — Telephone Encounter (Addendum)
Patient surgical orders have been placed. Patient needs to have a cardiology clearance and PCP Clearance. Called patients cardiologist [facility]) and scheduled patient an appointment for tomorrow for clearance and follow up. Patient called and made aware of appt for tomorrow 04/23/2021 at 1:50pm. Will obtain PCP clearance.

## 2021-04-23 NOTE — Telephone Encounter (Signed)
REQUEST FOR SURGICAL CLEARANCE       Date: Date: 04/23/2021  Faxed to: [facility]  Surgeon: Dr. Nickolas Madrid     Date of Surgery: 05/15/2021  Operation: TURBT  Anesthesia Type: General   Diagnosis: D49.4 Bladder Tumor  Patient Requires:    Medical Clearance : Yes  Reason: Complex Medical History and Anesthesia Risk Assessment    Risk Assessment:    Low   '[]'$       Moderate   '[]'$     High   '[]'$           This patient is optimized for surgery  YES '[]'$       NO   '[]'$    I recommend further assessment/workup prior to surgery. YES '[]'$      NO  '[]'$   Appointment scheduled for: _______________________   Further recommendations: ____________________________________     Physician Signature:__________________________________   Printed Name: ________________________________________   Date: _________________

## 2021-05-04 ENCOUNTER — Other Ambulatory Visit: Payer: Self-pay

## 2021-05-04 ENCOUNTER — Other Ambulatory Visit: Payer: Medicare Other

## 2021-05-04 DIAGNOSIS — D494 Neoplasm of unspecified behavior of bladder: Secondary | ICD-10-CM

## 2021-05-04 LAB — URINALYSIS, COMPLETE
Bilirubin, UA: NEGATIVE
Nitrite, UA: POSITIVE — AB
Specific Gravity, UA: 1.02 (ref 1.005–1.030)
Urobilinogen, Ur: >8 mg/dL — ABNORMAL HIGH (ref 0.2–1.0)
pH, UA: 6.5 (ref 5.0–7.5)

## 2021-05-04 LAB — MICROSCOPIC EXAMINATION
RBC, Urine: >30 /hpf — AB (ref 0–2)
WBC, UA: >30 /hpf — AB (ref 0–5)

## 2021-05-05 ENCOUNTER — Encounter
Admission: RE | Admit: 2021-05-05 | Discharge: 2021-05-05 | Disposition: A | Discharge status: Home / Self Care | Payer: Medicare Other | Source: Ambulatory Visit | Attending: Urology | Admitting: Urology

## 2021-05-05 HISTORY — DX: Non-ST elevation (NSTEMI) myocardial infarction: I21.4

## 2021-05-05 HISTORY — DX: Unspecified osteoarthritis, unspecified site: M19.90

## 2021-05-05 HISTORY — DX: Vestibular neuronitis, unspecified ear: H81.20

## 2021-05-05 HISTORY — DX: Hypertensive retinopathy, unspecified eye: H35.039

## 2021-05-05 NOTE — Patient Instructions (Addendum)
Your procedure is scheduled on: Friday, October 7 Report to the Registration Desk on the 1st floor of the [facility]. To find out your arrival time, please call [phone] between 1PM - 3PM on: Thursday, October 6  REMEMBER: Instructions that are not followed completely may result in serious medical risk, up to and including death; or upon the discretion of your surgeon and anesthesiologist your surgery may need to be rescheduled.  Do not eat or drink after midnight the night before surgery.  No gum chewing, lozengers or hard candies.  TAKE THESE MEDICATIONS THE MORNING OF SURGERY WITH A SIP OF WATER:  Amlodipine Metoprolol  Follow recommendations from Cardiologist, Pulmonologist or PCP regarding stopping Eliquis. Stop 3 days prior to surgery. Last day to take is Monday, October 3. Resume AFTER surgery per surgeon instruction.  One week prior to surgery: starting September 30 Stop Anti-inflammatories (NSAIDS) such as Advil, Aleve, Ibuprofen, Motrin, Naproxen, Naprosyn and Aspirin based products such as Excedrin, Goodys Powder, BC Powder. Stop ANY OVER THE COUNTER supplements until after surgery. You may however, continue to take Tylenol if needed for pain up until the day of surgery.  No Alcohol for 24 hours before or after surgery.  No Smoking including e-cigarettes for 24 hours prior to surgery.  No chewable tobacco products for at least 6 hours prior to surgery.  No nicotine patches on the day of surgery.  Do not use any "recreational" drugs for at least a week prior to your surgery.  Please be advised that the combination of cocaine and anesthesia may have negative outcomes, up to and including death. If you test positive for cocaine, your surgery will be cancelled.  On the morning of surgery brush your teeth with toothpaste and water, you may rinse your mouth with mouthwash if you wish. Do not swallow any toothpaste or mouthwash.  Do not wear jewelry, make-up, hairpins,  clips or nail polish.  Do not wear lotions, powders, or perfumes.   Do not shave body from the neck down 48 hours prior to surgery just in case you cut yourself which could leave a site for infection.   Contact lenses, hearing aids and dentures may not be worn into surgery.  Do not bring valuables to the hospital. [facility] is not responsible for any missing/lost belongings or valuables.   Notify your doctor if there is any change in your medical condition (cold, fever, infection).  Wear comfortable clothing (specific to your surgery type) to the hospital.  After surgery, you can help prevent lung complications by doing breathing exercises.  Take deep breaths and cough every 1-2 hours. Your doctor may order a device called an Incentive Spirometer to help you take deep breaths.  If you are being discharged the day of surgery, you will not be allowed to drive home. You will need a responsible adult (18 years or older) to drive you home and stay with you that night.   If you are taking public transportation, you will need to have a responsible adult (18 years or older) with you. Please confirm with your physician that it is acceptable to use public transportation.   Please call the [facility]. at [phone] if you have any questions about these instructions.  Surgery Visitation Policy:  Patients undergoing a surgery or procedure may have one family member or support person with them as long as that person is not COVID-19 positive or experiencing its symptoms.  That person may remain in the waiting  area during the procedure and may rotate out with other people.

## 2021-05-05 NOTE — Pre-Procedure Instructions (Signed)
Copy and pasted from cardiology visit:  Judye Bos, MD - 04/23/2021 1:50 PM EDT Cardiology Outpatient Visit  Visit Date: 04/25/21  Pre-op optimization -has had NM stress test and echo (mild pulmonary hypertension) were overall reassuring in 2021; no further ischemic workup needed -recommend continuing metoprolol succinate daily -ok to stop eliquis 2-3 days before urology procedure and for 5 days after surgery

## 2021-05-06 ENCOUNTER — Telehealth: Payer: Self-pay

## 2021-05-06 NOTE — Telephone Encounter (Signed)
Patient is still needing clearance from PCP Kristina Ellis) I reached out to [facility] and they stated that they needed to have an in-person visit. I scheduled the patient an appointment for Monday October 3rd at 3:00pm with a 2:40pm arrival time. Spoke with [name] in detail about appointment time and location. Patient verbalized understanding.

## 2021-05-07 ENCOUNTER — Encounter: Payer: Self-pay | Admitting: Urgent Care

## 2021-05-07 ENCOUNTER — Other Ambulatory Visit: Payer: Self-pay

## 2021-05-07 ENCOUNTER — Encounter
Admission: RE | Admit: 2021-05-07 | Discharge: 2021-05-07 | Disposition: A | Discharge status: Home / Self Care | Payer: Medicare Other | Source: Ambulatory Visit | Attending: Urology | Admitting: Urology

## 2021-05-07 DIAGNOSIS — Z01812 Encounter for preprocedural laboratory examination: Secondary | ICD-10-CM | POA: Insufficient documentation

## 2021-05-07 LAB — BASIC METABOLIC PANEL
Anion gap: 7 (ref 5–15)
BUN: 12 mg/dL (ref 8–23)
CO2: 23 mmol/L (ref 22–32)
Calcium: 8.7 mg/dL — ABNORMAL LOW (ref 8.9–10.3)
Chloride: 108 mmol/L (ref 98–111)
Creatinine, Ser: 0.77 mg/dL (ref 0.44–1.00)
GFR, Estimated: >60 mL/min (ref >60)
Glucose, Bld: 87 mg/dL (ref 70–99)
Potassium: 2.7 mmol/L — CL (ref 3.5–5.1)
Sodium: 138 mmol/L (ref 135–145)

## 2021-05-07 LAB — CBC
HCT: 36.1 % (ref 36.0–46.0)
Hemoglobin: 12.4 g/dL (ref 12.0–15.0)
MCH: 34.1 pg — ABNORMAL HIGH (ref 26.0–34.0)
MCHC: 34.3 g/dL (ref 30.0–36.0)
MCV: 99.2 fL (ref 80.0–100.0)
Platelets: 163 10*3/uL (ref 150–400)
RBC: 3.64 MIL/uL — ABNORMAL LOW (ref 3.87–5.11)
RDW: 12.9 % (ref 11.5–15.5)
WBC: 3.6 10*3/uL — ABNORMAL LOW (ref 4.0–10.5)
nRBC: 0 % (ref 0.0–0.2)

## 2021-05-07 NOTE — Progress Notes (Signed)
  [facility] Perioperative Services: Pre-Admission/Anesthesia Testing  Abnormal Lab Notification   Date: 05/07/21  Name: Kristina Ellis MRN:   [id number]  Re: Abnormal labs noted during PAT appointment   Provider(s) Notified: Billey Co, MD Notification mode: Routed and/or faxed via [facility] LAB VALUE(S): Lab Results  Component Value Date   K 2.7 (LL) 05/07/2021   Notes:  Patient is scheduled for a TURBT on 05/15/2021.  In review of her medication reconciliation, it is noted the patient is not currently taking any type of diuretic therapies.  Result being forwarded to primary attending surgeon for review and optimization.  Order placed for K+ to be rechecked on the day of her procedure in order to ensure correction of noted hypokalemia.   This is a Community education officer; no formal response is required.  Honor Loh, MSN, APRN, FNP-C, CEN [facility]  Peri-operative Services Nurse Practitioner Phone: [phone] Fax: [phone] 05/07/21 1:16 PM

## 2021-05-08 LAB — CULTURE, URINE COMPREHENSIVE

## 2021-05-11 ENCOUNTER — Encounter: Payer: Self-pay | Admitting: Urology

## 2021-05-11 NOTE — Progress Notes (Signed)
Perioperative Services  Pre-Admission/Anesthesia Testing Clinical Review  Date: 05/13/21  Patient Demographics:  Name: Kristina Ellis DOB:   November 07, 1950 MRN:   [id number]  Planned Surgical Procedure(s):    Case: [id number] Date/Time: 05/15/21 0911   Procedure: TRANSURETHRAL RESECTION OF BLADDER TUMOR (TURBT)   Anesthesia type: General   Pre-op diagnosis: Bladder Tumor   Location: [place] OR ROOM 10 / [facility] FOR ANESTHESIA GROUP   Surgeons: Billey Co, MD   NOTE: Available PAT nursing documentation and vital signs have been reviewed. Clinical nursing staff has updated patient's PMH/PSHx, current medication list, and drug allergies/intolerances to ensure comprehensive history available to assist in medical decision making as it pertains to the aforementioned surgical procedure and anticipated anesthetic course. Extensive review of available clinical information performed. [place] PMH and PSHx updated with any diagnoses/procedures that  may have been inadvertently omitted during her intake with the pre-admission testing department's nursing staff.  Clinical Discussion:  Kristina Ellis is a 69 y.o. female who is submitted for pre-surgical anesthesia review and clearance prior to her undergoing the above procedure. Patient is a Former Smoker (1 pack years). Pertinent PMH includes: atrial fibrillation, NSTEMI, valvular regurgitation, mitral stenosis, diastolic dysfunction, PAH, aortic atherosclerosis, carotid stenosis, HTN, IDA, hepatitis, cirrhosis, bladder mass, nephrolithiasis, OA.  Patient is followed by cardiology Kennith Center, MD). She was last seen in the cardiology clinic on 04/23/2021; notes reviewed.  At the time of her clinic visit, patient doing well overall from a cardiovascular perspective.  She denied any episodes of chest pain, however complained of shortness of breath and being unsteady on her feet.  Shortness of breath started following SARS-CoV-2 infection back in 12/2018.   Patient denied any PND, orthopnea, palpitations, significant peripheral edema, vertiginous symptoms, or presyncope/syncope.  PMH significant for cardiovascular diagnoses.  Patient suffered an NSTEMI on 07/26/2016.  Cardiac event was managed medically.  No ischemic work-up required at that time.  Patient diagnosed with atrial fibrillation in 12/2019.  She underwent TEE with cardioversion on 02/04/2020 at [facility].  Myocardial perfusion imaging study performed on 03/28/2020 revealed globally normal systolic function with a hyperdynamic LVEF of 72%.  There were no definitive perfusion defects indicative of ischemia.  TTE performed on 07/04/2020 revealed a normal left ventricular systolic function with an EF of 60 to 65%.  Diastolic parameters consistent with impaired left ventricular relaxation (G1DD).  The left atrium was mild to moderately dilated.  There was trivial mitral and pulmonary valve regurgitation.  There was mild tricuspid valve regurgitation.  Mild mitral valve stenosis noted with a mean gradient of 5.0 mmHg.  Again, patient has an atrial fibrillation diagnosis; CHA2DS2-VASc Score = 4 (age, sex, HTN, prior MI).  Rate and rhythm controlled on beta-blocker monotherapy.  Patient is on daily anticoagulation therapy using apixaban; compliant with therapy with no evidence of GI bleeding.  Blood pressure reasonably controlled at 132/66 on currently prescribed CCB and beta-blocker therapies.  Patient is on a statin for HLD. Functional capacity limited by shortness of breath following SARS-CoV-2 infection. Patient presented to the cardiology office for short distances in a wheelchair, however notes that she is ambulatory without becoming symptomatic; unable to complete 4 METS of activity.  No changes were made to her medication regimen.  Patient to follow-up with outpatient cardiology in 2 months or sooner if needed.  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 She is scheduled for an TRANSURETHRAL RESECTION  OF BLADDER TUMOR (TURBT) on 05/15/2021 with Dr. Nickolas Madrid, MD.  Given patient's past medical history significant for cardiovascular diagnoses, presurgical cardiac clearance was sought by the performing surgeon's office and PAT team.  Per cardiology, "patient had a nuclear medicine stress test and echocardiogram that were both reassuring in 2021. No further ischemic work-up is needed prior to surgery.  May proceed with planned urological procedure at an overall ACCEPTABLE risk of significant perioperative cardiovascular complications". Again, this patient is on daily anticoagulation therapy. She has been instructed on recommendations for holding her daily apixaban for 3 days prior to her procedure with plans to restart as soon as postoperative bleeding risk felt to be minimized by her attending surgeon. The patient has been instructed that her last dose of her anticoagulant will be on 05/11/2021.  Patient denies previous perioperative complications with anesthesia in the past. In review of the available records, it is noted that patient underwent a general anesthetic course at [facility] ([facility] in 01/2020 without documented complications.   Vitals with BMI 05/05/2021 04/16/2021 03/23/2021  Height 4' 11.5" 4\' 11"  4\' 11"   Weight 130 lbs 134 lbs 134 lbs  BMI 25.83 35.36 14.43  Systolic - 154 008  Diastolic - 91 80  Pulse - 87 76    Providers/Specialists:   NOTE: Primary physician provider listed below. Patient may have been seen by APP or partner within same practice.   PROVIDER ROLE / SPECIALTY LAST Ranae Pila, MD UROLOGY (SURGEON) 04/16/2021  Center, [place] PROVIDER ???  Stephani Police, MD CARDIOLOGY 04/23/2021   Allergies:  Atenolol and Penicillins  Current Home Medications:   No current facility-administered medications for this encounter.     amLODipine (NORVASC) 10 MG tablet   ELIQUIS 5 MG TABS tablet   FEROSUL 325 (65 Fe) MG tablet   magnesium oxide (MAG-OX) 400 MG tablet   metoprolol succinate (TOPROL-XL) 100 MG 24 hr tablet   trolamine salicylate (ASPERCREME) 10 % cream   potassium chloride SA (KLOR-CON) 20 MEQ tablet   History:   Past Medical History:  Diagnosis Date   Aortic atherosclerosis (HCC)    Arthritis    Atrial fibrillation (HCC)    a.) s/p TEE with cardioversion on 02/04/2020. b.) on daily apixaban.   Bladder mass    a.) CT 04/08/2021 --> 3.2 cm intraluminal bladder mass.   Carotid stenosis, bilateral    a.) Doppler 07/04/2020 --> mild; 1-49% stenosis BILATERALLY.   Cholelithiasis    a.) CT 11/06/2020 --> largest measured 4.5 cm   Chronic anticoagulation    a.) Apixaban   Common biliary duct calculus    a.) CT 04/08/2021 --> CBD dilated at 8 mm; 6mm calculus within distal CBD.   Diastolic dysfunction    a.) TTE 07/04/2020 --> LVEF 60-65%; G1DD.   Hepatic cirrhosis (HCC)    Hepatic steatosis    Hepatitis 1970   History of 2019 novel coronavirus disease (COVID-19) 12/20/2019   History of marijuana use    Hypertension    Hypertensive retinopathy    IDA (iron deficiency anemia)    Mitral stenosis    a.) TEE 04/10/2020 --> mild. b.) TTE 07/04/2020 --> EF 60-65%; mild (mean gradient 5 mmHg).   Nephrolithiasis    NSTEMI (non-ST elevated myocardial infarction) ([place]) 07/26/2016   PAH (pulmonary artery hypertension) ([place])    a.) TTE 12/21/2019 --> PASP 44 mmHg.   Uterine fibroid    a.) CT 04/08/2021 --> multiple with  largest measuring 7 cm.   Valvular regurgitation    a.) TTE 09/01/2010 --> trivial to mild pan-valvular. b.) TTE 12/21/2019 --> mild MR and AR; moderate TR. c.) TTE 07/04/2020 --> mild TR; trivial MR and PR.   Vestibular neuronitis    Past Surgical History:  Procedure Laterality Date   BREAST CYST ASPIRATION Left    COLONOSCOPY  2012   TEE WITH CARDIOVERSION N/A 02/04/2020    Procedure: TEE WITH CARDIOVERSION; Location: [facility]; Surgeon: Kandis Cocking, MD   Family History  Problem Relation Age of Onset   Aneurysm Mother    Colon cancer Father    Lung cancer Brother    Breast cancer Neg Hx    Social History   Tobacco Use   Smoking status: Former    Packs/day: 0.10    Years: 10.00    Pack years: 1.00    Types: Cigarettes   Smokeless tobacco: Never   Tobacco comments:    occasional smoker  Vaping Use   Vaping Use: Never used  Substance Use Topics   Alcohol use: Yes    Alcohol/week: 5.0 standard drinks    Types: 5 Cans of beer per week    Comment: weekly   Drug use: Not Currently    Types: 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 Will recheck on day of surgery to ensure that it is safe to proceed.   No visits with results within 3 Day(s) from this visit.  Latest known visit with results is:  Hospital Outpatient Visit on 05/07/2021  Component Date Value Ref Range Status   Sodium 05/07/2021 138  135 - 145 mmol/L Final   Potassium 05/07/2021 2.7 (A) 3.5 - 5.1 mmol/L Final   Comment: CRITICAL RESULT CALLED TO, READ BACK BY AND VERIFIED WITH BRIAN Valdis Bevill @1317  05/07/21 MJU    Chloride 05/07/2021 108  98 - 111 mmol/L Final   CO2 05/07/2021 23  22 - 32 mmol/L Final   Glucose, Bld 05/07/2021 87  70 - 99 mg/dL Final   Glucose reference range applies only to samples taken after fasting for at least 8 hours.   BUN 05/07/2021 12  8 - 23 mg/dL Final   Creatinine, Ser 05/07/2021 0.77  0.44 - 1.00 mg/dL Final   Calcium 05/07/2021 8.7 (A) 8.9 - 10.3 mg/dL Final   GFR, Estimated 05/07/2021 >60  >60 mL/min Final   Comment: (NOTE) Calculated using the CKD-EPI Creatinine Equation (2021)    Anion gap 05/07/2021 7  5 - 15 Final   Performed at [facility], [address] [place], [place] [address]   WBC 05/07/2021 3.6 (A) 4.0 - 10.5  K/uL Final   RBC 05/07/2021 3.64 (A) 3.87 - 5.11 MIL/uL Final   Hemoglobin 05/07/2021 12.4  12.0 - 15.0 g/dL Final   HCT 05/07/2021 36.1  36.0 - 46.0 % Final   MCV 05/07/2021 99.2  80.0 - 100.0 fL Final   MCH 05/07/2021 34.1 (A) 26.0 - 34.0 pg Final   MCHC 05/07/2021 34.3  30.0 - 36.0 g/dL Final   RDW 05/07/2021 12.9  11.5 - 15.5 % Final   Platelets 05/07/2021 163  150 - 400 K/uL Final   nRBC 05/07/2021 0.0  0.0 - 0.2 % Final   Performed at [facility], [address] [place], [place] [address]    ECG: Date: 04/23/2021 Rate: 85  bpm Rhythm: atrial fibrillation Axis (leads I and aVF): Left axis deviation Intervals: QRS 92 ms. QTc 447 ms. ST segment and T wave changes: No evidence of acute ST segment elevation or depression. Evidence of an age undetermined anterior infarct present. Comparison: Similar to previous tracing obtained on 08/05/2020 NOTE: Tracing obtained at [facility]; unable for review. Above based on cardiologist's interpretation.    IMAGING / PROCEDURES: CT HEMATURIA WORKUP performed on 04/08/2021 3.2 cm intraluminal mass in urinary bladder, consistent with bladder carcinoma. No evidence of upper urinary tract neoplasm. No evidence of abdominal or pelvic metastatic disease. 3 mm left renal calculus. No evidence of ureteral calculi or hydronephrosis. Hepatic cirrhosis and steatosis. No evidence of hepatic neoplasm. Cholelithiasis with the largest measuring 4.5 cm. No radiographic evidence of cholecystitis. Mild biliary ductal dilatation (CBD measured 8 mm) due to 5 mm calcified stone in distal common bile duct. Uterine fibroids, largest measuring 7 cm. Aortic atherosclerosis  TRANSTHORACIC ECHOCARDIOGRAM performed on 07/04/2020 Left ventricular ejection fraction, by estimation, is 60 to 65%. Left ventricular ejection fraction by PLAX is 70 %. The left ventricle has  normal function. The left ventricle has no regional wall motion abnormalities. Left ventricular  diastolic parameters are consistent with Grade I diastolic dysfunction (impaired relaxation).  Right ventricular systolic function was not well visualized. The right ventricular size is normal.  Left atrial size was mild to moderately dilated.  The mitral valve is degenerative. Trivial mitral valve regurgitation. Mild mitral stenosis.  The aortic valve was not well visualized. Aortic valve regurgitation is not visualized.   BILATERAL  CAROTID DOPPLER STUDY performed on 09/04/2019 Mild (1-49%) stenosis proximal right internal carotid artery secondary to heterogenous atherosclerotic plaque. Mild (1-49%) stenosis proximal left internal carotid artery secondary to heterogenous atherosclerotic plaque. Vertebral arteries are patent with normal antegrade flow.   LEXISCAN performed on 03/28/2020 Hyperdynamic LVEF calculated at 72% Globally normal systolic function. Coronary and aortic calcifications noted on attenuation correction CT No definitive perfusion defect to indicate ischemia  Impression and Plan:  Kristina Ellis has been referred for pre-anesthesia review and clearance prior to her undergoing the planned anesthetic and procedural courses. Available labs, pertinent testing, and imaging results were personally reviewed by me. This patient has been appropriately cleared by cardiology with an overall ACCEPTABLE risk of significant perioperative cardiovascular complications.  Based on clinical review performed today (05/13/21), barring any significant acute changes in the patient's overall condition, it is anticipated that she will be able to proceed with the planned surgical intervention. Any acute changes in clinical condition may necessitate her procedure being postponed and/or cancelled. Patient will meet with anesthesia team (MD and/or CRNA) on the day of her procedure for preoperative evaluation/assessment. Questions regarding anesthetic course will be fielded at that time.   Pre-surgical  instructions were reviewed with the patient during her PAT appointment and questions were fielded by PAT clinical staff. Patient was advised that if any questions or concerns arise prior to her procedure then she should return a call to PAT and/or her surgeon's office to discuss.  Honor Loh, MSN, APRN, FNP-C, CEN [facility]  Peri-operative Services Nurse Practitioner Phone: [phone] Fax: [phone] 05/13/21 3:25 PM  NOTE: This note has been prepared using Dragon dictation software. Despite my best ability to proofread, there is always the potential that unintentional transcriptional errors may still occur from this process.

## 2021-05-12 ENCOUNTER — Other Ambulatory Visit: Payer: Self-pay | Admitting: Urology

## 2021-05-12 ENCOUNTER — Telehealth: Payer: Self-pay

## 2021-05-12 DIAGNOSIS — E876 Hypokalemia: Secondary | ICD-10-CM

## 2021-05-12 MED ORDER — POTASSIUM CHLORIDE CRYS ER 20 MEQ PO TBCR: 20.0000 meq | EXTENDED_RELEASE_TABLET | Freq: Two times a day (BID) | ORAL | 0 refills | Status: DC

## 2021-05-12 NOTE — Telephone Encounter (Signed)
Called pt informed her of the information below. Pt voiced understanding, states she has transportation issues and may not be able to pick up today, stressed the importance of picking up today. Pt gave verbal understanding. Confirmed pt has d/c Eliquis.

## 2021-05-12 NOTE — Telephone Encounter (Signed)
-----   Message from Billey Co, MD sent at 05/12/2021  8:16 AM EDT ----- Regarding: replace k+ Please start potassium chloride 20 mEq twice daily to start this morning, x 8 tabs.  Her potassium is low, and they will cancel her surgery Friday if it is not replaced.  Please also confirm that she is stopping Eliquis.  Thank you!  Nickolas Madrid, MD 05/12/2021   ----- Message ----- From: Gerald Leitz, [place] Sent: 05/11/2021  10:21 AM EDT To: Billey Co, MD  Patient has a potassium of 2.7 from labs obtained at Pre-Admit. Bryan with Pre-Admit states that Anesthesia will cancel her surgery if level is not above 3.0. Please Advise.

## 2021-05-13 ENCOUNTER — Telehealth: Payer: Self-pay

## 2021-05-14 NOTE — Telephone Encounter (Signed)
Patient seen at [facility] yesterday, they will fax notes over.

## 2021-05-15 ENCOUNTER — Ambulatory Visit
Admission: RE | Admit: 2021-05-15 | Discharge: 2021-05-15 | Disposition: A | Discharge status: Hospice | Payer: Medicare Other | Attending: Urology | Admitting: Urology

## 2021-05-15 ENCOUNTER — Ambulatory Visit: Payer: Medicare Other | Admitting: Urgent Care

## 2021-05-15 ENCOUNTER — Other Ambulatory Visit: Payer: Self-pay

## 2021-05-15 ENCOUNTER — Encounter
Admission: RE | Disposition: A | Discharge status: Hospice | Payer: Self-pay | Source: Home / Self Care | Attending: Urology

## 2021-05-15 ENCOUNTER — Encounter: Payer: Self-pay | Admitting: Urology

## 2021-05-15 DIAGNOSIS — Z8616 Personal history of COVID-19: Secondary | ICD-10-CM | POA: Diagnosis not present

## 2021-05-15 DIAGNOSIS — D494 Neoplasm of unspecified behavior of bladder: Secondary | ICD-10-CM

## 2021-05-15 DIAGNOSIS — C672 Malignant neoplasm of lateral wall of bladder: Secondary | ICD-10-CM | POA: Diagnosis not present

## 2021-05-15 DIAGNOSIS — C678 Malignant neoplasm of overlapping sites of bladder: Secondary | ICD-10-CM | POA: Diagnosis not present

## 2021-05-15 DIAGNOSIS — Z880 Allergy status to penicillin: Secondary | ICD-10-CM | POA: Insufficient documentation

## 2021-05-15 DIAGNOSIS — Z7901 Long term (current) use of anticoagulants: Secondary | ICD-10-CM | POA: Diagnosis not present

## 2021-05-15 DIAGNOSIS — Z79899 Other long term (current) drug therapy: Secondary | ICD-10-CM | POA: Insufficient documentation

## 2021-05-15 DIAGNOSIS — I4891 Unspecified atrial fibrillation: Secondary | ICD-10-CM | POA: Diagnosis not present

## 2021-05-15 DIAGNOSIS — R310 Gross hematuria: Secondary | ICD-10-CM | POA: Insufficient documentation

## 2021-05-15 DIAGNOSIS — Z87891 Personal history of nicotine dependence: Secondary | ICD-10-CM | POA: Insufficient documentation

## 2021-05-15 HISTORY — DX: Cannabis use, unspecified, in remission: F12.91

## 2021-05-15 HISTORY — DX: Calculus of gallbladder without cholecystitis without obstruction: K80.20

## 2021-05-15 HISTORY — DX: Endocarditis, valve unspecified: I38

## 2021-05-15 HISTORY — DX: Calculus of bile duct without cholangitis or cholecystitis without obstruction: K80.50

## 2021-05-15 HISTORY — DX: Iron deficiency anemia, unspecified: D50.9

## 2021-05-15 HISTORY — DX: Long term (current) use of anticoagulants: Z79.01

## 2021-05-15 HISTORY — DX: Other specified disorders of bladder: N32.89

## 2021-05-15 HISTORY — DX: Other ill-defined heart diseases: I51.89

## 2021-05-15 HISTORY — PX: TRANSURETHRAL RESECTION OF BLADDER TUMOR: SHX2575

## 2021-05-15 HISTORY — DX: Unspecified cirrhosis of liver: K74.60

## 2021-05-15 HISTORY — DX: Occlusion and stenosis of bilateral carotid arteries: I65.23

## 2021-05-15 HISTORY — DX: Rheumatic mitral stenosis: I05.0

## 2021-05-15 HISTORY — DX: Calculus of kidney: N20.0

## 2021-05-15 HISTORY — DX: Atherosclerosis of aorta: I70.0

## 2021-05-15 HISTORY — DX: Secondary pulmonary arterial hypertension: I27.21

## 2021-05-15 HISTORY — DX: Fatty (change of) liver, not elsewhere classified: K76.0

## 2021-05-15 HISTORY — DX: Leiomyoma of uterus, unspecified: D25.9

## 2021-05-15 LAB — POCT I-STAT, CHEM 8
BUN: 7 mg/dL — ABNORMAL LOW (ref 8–23)
Calcium, Ion: 1.24 mmol/L (ref 1.15–1.40)
Chloride: 110 mmol/L (ref 98–111)
Creatinine, Ser: 0.7 mg/dL (ref 0.44–1.00)
Glucose, Bld: 97 mg/dL (ref 70–99)
HCT: 41 % (ref 36.0–46.0)
Hemoglobin: 13.9 g/dL (ref 12.0–15.0)
Potassium: 4.3 mmol/L (ref 3.5–5.1)
Sodium: 144 mmol/L (ref 135–145)
TCO2: 22 mmol/L (ref 22–32)

## 2021-05-15 SURGERY — TURBT (TRANSURETHRAL RESECTION OF BLADDER TUMOR)
Anesthesia: General

## 2021-05-15 MED ORDER — MIDAZOLAM HCL 2 MG/2ML IJ SOLN
INTRAMUSCULAR | Status: AC
  Filled 2021-05-15: qty 2

## 2021-05-15 MED ORDER — FENTANYL CITRATE (PF) 100 MCG/2ML IJ SOLN
INTRAMUSCULAR | Status: AC
  Filled 2021-05-15: qty 2

## 2021-05-15 MED ORDER — FENTANYL CITRATE (PF) 100 MCG/2ML IJ SOLN
INTRAMUSCULAR | Status: DC | PRN
  Administered 2021-05-15 (×2): 50 ug via INTRAVENOUS

## 2021-05-15 MED ORDER — LACTATED RINGERS IV SOLN: INTRAVENOUS | Status: DC

## 2021-05-15 MED ORDER — PROPOFOL 500 MG/50ML IV EMUL
INTRAVENOUS | Status: AC
  Filled 2021-05-15: qty 50

## 2021-05-15 MED ORDER — DEXAMETHASONE SODIUM PHOSPHATE 10 MG/ML IJ SOLN
INTRAMUSCULAR | Status: AC
  Filled 2021-05-15: qty 1

## 2021-05-15 MED ORDER — DEXAMETHASONE SODIUM PHOSPHATE 10 MG/ML IJ SOLN
INTRAMUSCULAR | Status: DC | PRN
  Administered 2021-05-15: 6 mg via INTRAVENOUS

## 2021-05-15 MED ORDER — HYDROCODONE-ACETAMINOPHEN 5-325 MG PO TABS: 1.0000 | ORAL_TABLET | ORAL | 0 refills | Status: AC | PRN

## 2021-05-15 MED ORDER — ESMOLOL HCL 100 MG/10ML IV SOLN
INTRAVENOUS | Status: DC | PRN
  Administered 2021-05-15 (×3): 20 mg via INTRAVENOUS

## 2021-05-15 MED ORDER — ROCURONIUM BROMIDE 10 MG/ML (PF) SYRINGE
PREFILLED_SYRINGE | INTRAVENOUS | Status: AC
  Filled 2021-05-15: qty 10

## 2021-05-15 MED ORDER — ONDANSETRON HCL 4 MG/2ML IJ SOLN
INTRAMUSCULAR | Status: DC | PRN
  Administered 2021-05-15: 4 mg via INTRAVENOUS

## 2021-05-15 MED ORDER — ORAL CARE MOUTH RINSE: 15.0000 mL | Freq: Once | OROMUCOSAL | Status: AC

## 2021-05-15 MED ORDER — SUGAMMADEX SODIUM 200 MG/2ML IV SOLN
INTRAVENOUS | Status: DC | PRN
  Administered 2021-05-15: 125 mg via INTRAVENOUS

## 2021-05-15 MED ORDER — STERILE WATER FOR IRRIGATION IR SOLN
Status: DC | PRN
  Administered 2021-05-15: 3000 mL

## 2021-05-15 MED ORDER — ESMOLOL HCL 100 MG/10ML IV SOLN
INTRAVENOUS | Status: AC
  Filled 2021-05-15: qty 10

## 2021-05-15 MED ORDER — CHLORHEXIDINE GLUCONATE 0.12 % MT SOLN: 15.0000 mL | Freq: Once | OROMUCOSAL | Status: AC

## 2021-05-15 MED ORDER — LIDOCAINE HCL (PF) 2 % IJ SOLN
INTRAMUSCULAR | Status: AC
  Filled 2021-05-15: qty 5

## 2021-05-15 MED ORDER — FAMOTIDINE 20 MG PO TABS: 20.0000 mg | ORAL_TABLET | Freq: Once | ORAL | Status: AC

## 2021-05-15 MED ORDER — ONDANSETRON HCL 4 MG/2ML IJ SOLN
INTRAMUSCULAR | Status: AC
  Filled 2021-05-15: qty 2

## 2021-05-15 MED ORDER — FENTANYL CITRATE (PF) 100 MCG/2ML IJ SOLN: 25.0000 ug | INTRAMUSCULAR | Status: DC | PRN

## 2021-05-15 MED ORDER — LIDOCAINE HCL (CARDIAC) PF 100 MG/5ML IV SOSY
PREFILLED_SYRINGE | INTRAVENOUS | Status: DC | PRN
Start: 2021-05-15 — End: 2021-05-15
  Administered 2021-05-15: 60 mg via INTRAVENOUS

## 2021-05-15 MED ORDER — ONDANSETRON HCL 4 MG/2ML IJ SOLN: 4.0000 mg | Freq: Once | INTRAMUSCULAR | Status: DC | PRN

## 2021-05-15 MED ORDER — METOPROLOL TARTRATE 5 MG/5ML IV SOLN
INTRAVENOUS | Status: AC
  Filled 2021-05-15: qty 5

## 2021-05-15 MED ORDER — PHENYLEPHRINE HCL (PRESSORS) 10 MG/ML IV SOLN
INTRAVENOUS | Status: DC | PRN
  Administered 2021-05-15 (×3): 100 ug via INTRAVENOUS

## 2021-05-15 MED ORDER — FAMOTIDINE 20 MG PO TABS
ORAL_TABLET | ORAL | Status: AC
  Administered 2021-05-15: 20 mg via ORAL
  Filled 2021-05-15: qty 1

## 2021-05-15 MED ORDER — CHLORHEXIDINE GLUCONATE 0.12 % MT SOLN
OROMUCOSAL | Status: AC
  Administered 2021-05-15: 15 mL via OROMUCOSAL
  Filled 2021-05-15: qty 15

## 2021-05-15 MED ORDER — CEFAZOLIN SODIUM-DEXTROSE 2-4 GM/100ML-% IV SOLN
INTRAVENOUS | Status: AC
  Filled 2021-05-15: qty 100

## 2021-05-15 MED ORDER — ROCURONIUM BROMIDE 100 MG/10ML IV SOLN
INTRAVENOUS | Status: DC | PRN
  Administered 2021-05-15: 20 mg via INTRAVENOUS
  Administered 2021-05-15: 10 mg via INTRAVENOUS
  Administered 2021-05-15: 30 mg via INTRAVENOUS

## 2021-05-15 MED ORDER — CEFAZOLIN SODIUM-DEXTROSE 2-4 GM/100ML-% IV SOLN
2.0000 g | INTRAVENOUS | Status: AC
  Administered 2021-05-15: 2 g via INTRAVENOUS

## 2021-05-15 MED ORDER — METOPROLOL TARTRATE 5 MG/5ML IV SOLN
INTRAVENOUS | Status: DC | PRN
  Administered 2021-05-15: 2 mg via INTRAVENOUS
  Administered 2021-05-15: 1 mg via INTRAVENOUS

## 2021-05-15 MED ORDER — PROPOFOL 10 MG/ML IV BOLUS
INTRAVENOUS | Status: DC | PRN
  Administered 2021-05-15: 100 mg via INTRAVENOUS

## 2021-05-15 SURGICAL SUPPLY — 37 items
BAG DRAIN CYSTO-URO LG1000N (MISCELLANEOUS) ×2 IMPLANT
BAG URINE DRAIN 2000ML AR STRL (UROLOGICAL SUPPLIES) ×2 IMPLANT
BRUSH SCRUB EZ  4% CHG (MISCELLANEOUS) ×2
BRUSH SCRUB EZ 4% CHG (MISCELLANEOUS) ×1 IMPLANT
CATH FOL 2WAY LX 18X30 (CATHETERS) IMPLANT
CATH FOL 2WAY LX 22X30 (CATHETERS) ×2 IMPLANT
CONTAINER COLLECT MORCELLATR (MISCELLANEOUS) ×1 IMPLANT
DRAPE UTILITY 15X26 TOWEL STRL (DRAPES) ×2 IMPLANT
DRSG TELFA 4X3 1S NADH ST (GAUZE/BANDAGES/DRESSINGS) ×2 IMPLANT
ELECT LOOP 22F BIPOLAR SML (ELECTROSURGICAL)
ELECT REM PT RETURN 9FT ADLT (ELECTROSURGICAL)
ELECTRODE LOOP 22F BIPOLAR SML (ELECTROSURGICAL) IMPLANT
ELECTRODE REM PT RTRN 9FT ADLT (ELECTROSURGICAL) IMPLANT
GAUZE 4X4 16PLY ~~LOC~~+RFID DBL (SPONGE) ×2 IMPLANT
GLOVE SURG UNDER POLY LF SZ7.5 (GLOVE) ×2 IMPLANT
GOWN STRL REUS W/ TWL LRG LVL3 (GOWN DISPOSABLE) ×1 IMPLANT
GOWN STRL REUS W/ TWL XL LVL3 (GOWN DISPOSABLE) ×1 IMPLANT
GOWN STRL REUS W/TWL LRG LVL3 (GOWN DISPOSABLE) ×2
GOWN STRL REUS W/TWL XL LVL3 (GOWN DISPOSABLE) ×2
IV NS IRRIG 3000ML ARTHROMATIC (IV SOLUTION) ×8 IMPLANT
KIT TURNOVER CYSTO (KITS) ×2 IMPLANT
LOOP CUT BIPOLAR 24F LRG (ELECTROSURGICAL) ×2 IMPLANT
MANIFOLD NEPTUNE II (INSTRUMENTS) IMPLANT
MORCELLATOR COLLECT CONTAINER (MISCELLANEOUS) ×2
MORCELLATOR ROTATION 4.75 335 (MISCELLANEOUS) ×2 IMPLANT
NDL SAFETY ECLIPSE 18X1.5 (NEEDLE) ×1 IMPLANT
NEEDLE HYPO 18GX1.5 SHARP (NEEDLE) ×2
PACK CYSTO AR (MISCELLANEOUS) ×2 IMPLANT
PAD ARMBOARD 7.5X6 YLW CONV (MISCELLANEOUS) ×2 IMPLANT
SET CYSTO W/LG BORE CLAMP LF (SET/KITS/TRAYS/PACK) ×2 IMPLANT
SET IRRIG Y TYPE TUR BLADDER L (SET/KITS/TRAYS/PACK) ×2 IMPLANT
SURGILUBE 2OZ TUBE FLIPTOP (MISCELLANEOUS) ×2 IMPLANT
SYR TOOMEY IRRIG 70ML (MISCELLANEOUS) ×2
SYRINGE TOOMEY IRRIG 70ML (MISCELLANEOUS) ×1 IMPLANT
TUBE PUMP MORCELLATOR PIRANHA (TUBING) ×2 IMPLANT
WATER STERILE IRR 1000ML POUR (IV SOLUTION) IMPLANT
WATER STERILE IRR 500ML POUR (IV SOLUTION) ×2 IMPLANT

## 2021-05-15 NOTE — Anesthesia Postprocedure Evaluation (Signed)
Anesthesia Post Note  Patient: Kristina Ellis  Procedure(s) Performed: TRANSURETHRAL RESECTION OF BLADDER TUMOR (TURBT)  Patient location during evaluation: PACU Anesthesia Type: General Level of consciousness: awake and alert and oriented Pain management: pain level controlled Vital Signs Assessment: post-procedure vital signs reviewed and stable Respiratory status: spontaneous breathing, nonlabored ventilation and respiratory function stable Cardiovascular status: blood pressure returned to baseline and stable Postop Assessment: no signs of nausea or vomiting Anesthetic complications: no   No notable events documented.   Last Vitals:  Vitals:   05/15/21 1300 05/15/21 1329  BP: (!) 172/93 (!) 169/92  Pulse:  73  Resp: 13 15  Temp: (!) 36.4 C (!) 36.2 C  SpO2: 100% 100%    Last Pain:  Vitals:   05/15/21 1329  TempSrc: Tympanic  PainSc: 0-No pain                 Sarai January

## 2021-05-15 NOTE — Anesthesia Preprocedure Evaluation (Signed)
Anesthesia Evaluation  Patient identified by MRN, date of birth, ID band Patient awake    Reviewed: Allergy & Precautions, NPO status , Patient's Chart, lab work & pertinent test results  History of Anesthesia Complications Negative for: history of anesthetic complications  Airway Mallampati: II  TM Distance: >3 FB Neck ROM: Full    Dental  (+) Edentulous Upper, Missing, Poor Dentition   Pulmonary neg sleep apnea, neg COPD, former smoker,    breath sounds clear to auscultation- rhonchi (-) wheezing      Cardiovascular hypertension, Pt. on medications + Past MI  (-) CAD, (-) Cardiac Stents and (-) CABG + dysrhythmias Atrial Fibrillation  Rhythm:Regular Rate:Normal - Systolic murmurs and - Diastolic murmurs    Neuro/Psych neg Seizures negative neurological ROS  negative psych ROS   GI/Hepatic negative GI ROS, Neg liver ROS,   Endo/Other  negative endocrine ROSneg diabetes  Renal/GU Renal disease: hx of nephrolithiasis.     Musculoskeletal  (+) Arthritis ,   Abdominal (+) - obese,   Peds  Hematology  (+) anemia ,   Anesthesia Other Findings Past Medical History: No date: Aortic atherosclerosis (HCC) No date: Arthritis No date: Atrial fibrillation ([place])     Comment:  a.) s/p TEE with cardioversion on 02/04/2020. b.) on               daily apixaban. No date: Bladder mass     Comment:  a.) CT 04/08/2021 --> 3.2 cm intraluminal bladder mass. No date: Carotid stenosis, bilateral     Comment:  a.) Doppler 07/04/2020 --> mild; 1-49% stenosis               BILATERALLY. No date: Cholelithiasis     Comment:  a.) CT 11/06/2020 --> largest measured 4.5 cm No date: Chronic anticoagulation     Comment:  a.) Apixaban No date: Common biliary duct calculus     Comment:  a.) CT 04/08/2021 --> CBD dilated at 8 mm; 30mm calculus               within distal CBD. No date: Diastolic dysfunction     Comment:  a.) TTE 07/04/2020 -->  LVEF 60-65%; G1DD. No date: Hepatic cirrhosis ([place]) No date: Hepatic steatosis 1970: Hepatitis 12/20/2019: History of 2019 novel coronavirus disease (COVID-19) No date: History of marijuana use No date: Hypertension No date: Hypertensive retinopathy No date: IDA (iron deficiency anemia) No date: Mitral stenosis     Comment:  a.) TEE 04/10/2020 --> mild. b.) TTE 07/04/2020 --> EF               60-65%; mild (mean gradient 5 mmHg). No date: Nephrolithiasis 07/26/2016: NSTEMI (non-ST elevated myocardial infarction) ([place]) No date: PAH (pulmonary artery hypertension) ([place])     Comment:  a.) TTE 12/21/2019 --> PASP 44 mmHg. No date: Uterine fibroid     Comment:  a.) CT 04/08/2021 --> multiple with largest measuring 7               cm. No date: Valvular regurgitation     Comment:  a.) TTE 09/01/2010 --> trivial to mild pan-valvular. b.)              TTE 12/21/2019 --> mild MR and AR; moderate TR. c.) TTE               07/04/2020 --> mild TR; trivial MR and PR. 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   Reproductive/Obstetrics  Anesthesia Physical Anesthesia Plan  ASA: 3  Anesthesia Plan: General   Post-op Pain Management:    Induction: Intravenous  PONV Risk Score and Plan: 2 and Ondansetron and Dexamethasone  Airway Management Planned: Oral ETT  Additional Equipment:   Intra-op Plan:   Post-operative Plan: Extubation in OR  Informed Consent: I have reviewed the patients History and Physical, chart, labs and discussed the procedure including the risks, benefits and alternatives for the proposed anesthesia with the patient or authorized representative who has indicated his/her understanding and acceptance.     Dental advisory given  Plan Discussed with: CRNA and Anesthesiologist  Anesthesia Plan Comments:         Anesthesia Quick Evaluation

## 2021-05-15 NOTE — Op Note (Signed)
Date of procedure: 05/15/21  Preoperative diagnosis:  Bladder tumor, large  Postoperative diagnosis:  Same  Procedure: TURBT large, > 6 cm  Surgeon: Nickolas Madrid, MD  Anesthesia: General  Complications: None  Intraoperative findings:  Narrow urethra, dilated with sounds Ureteral orifices orthotopic bilaterally and uninvolved with tumor Large 4 cm bullous appearing tumor at the right superior lateral wall, ~6cm of flat and papillary tumor at the posterior wall that moved up to the left lateral wall Excellent hemostasis, all visible tumor removed  EBL: Minimal  Specimens: Bladder tumor  Drains: 22 French two-way Foley  Indication: Kristina Ellis is a 70 y.o. patient with gross hematuria and dysuria found to have large bladder tumor on CT.  After reviewing the management options for treatment, they elected to proceed with the above surgical procedure(s). We have discussed the potential benefits and risks of the procedure, side effects of the proposed treatment, the likelihood of the patient achieving the goals of the procedure, and any potential problems that might occur during the procedure or recuperation. Informed consent has been obtained.  Description of procedure:  The patient was taken to the operating room and general anesthesia was induced. SCDs were placed for DVT prophylaxis. The patient was placed in the dorsal lithotomy position, prepped and draped in the usual sterile fashion, and preoperative antibiotics(Ancef) were administered. A preoperative time-out was performed.   The urethra was narrow, this was dilated with female sounds to 25 [place].  The 26 French resectoscope was inserted into the bladder and thorough cystoscopy showed that the ureteral orifices were orthotopic bilaterally and uninvolved with tumor, and there was a large bullous tumor at the right superior lateral wall, and approximately 6 cm of flat and papillary tumor at the posterior wall moving to the  left side.  I started by using a combination of a Collins knife and a large resecting loop to remove the 4 cm bollous tumor that was on a medium size stock on the right side and this was removed en bloc carefully.  There was no evidence of bladder perforation.  This was too large to be removed through the urethra, and the Piranha morcellating device was used to remove the tumor and sent to pathology.  I then used the large resecting loop to remove the flat and papillary tumor that encompassed the trigone, posterior wall, and left posterior wall.  All visible tumor was removed.  Meticulous hemostasis was achieved.  There was no evidence of bladder perforation.  With the bladder decompressed there was excellent hemostasis.  All tumor chips were removed and sent for pathology.  A 22 French two-way Foley was placed and 10 mL placed in the balloon.  The catheter was irrigated with 1 L of sterile water and remained crystal-clear.  Disposition: Stable to PACU  Plan: Follow-up in clinic in 10 to 14 days with MD for Foley removal and to discuss pathology  Nickolas Madrid, MD

## 2021-05-15 NOTE — H&P (Signed)
05/15/21 10:51 AM   Kristina Ellis 05-Apr-1951 [id number]  CC: bladder tumor  HPI: 70 year old female on Eliquis for atrial fibrillation who was referred for 2 to 3 months of gross hematuria and small clots, as well as dysuria.  Urine culture did ultimately grow Ureaplasma, but she had no improvement in her dysuria on doxycycline.  With her significant hematuria we still performed CT, and unfortunately this showed a 3 cm mass within the bladder consistent with bladder cancer.  No obvious evidence of metastatic disease, no hydronephrosis.   We discussed these findings at length, and likely new diagnosis of bladder cancer.   PMH: Past Medical History:  Diagnosis Date   Aortic atherosclerosis ([place])    Arthritis    Atrial fibrillation ([place])    a.) s/p TEE with cardioversion on 02/04/2020. b.) on daily apixaban.   Bladder mass    a.) CT 04/08/2021 --> 3.2 cm intraluminal bladder mass.   Carotid stenosis, bilateral    a.) Doppler 07/04/2020 --> mild; 1-49% stenosis BILATERALLY.   Cholelithiasis    a.) CT 11/06/2020 --> largest measured 4.5 cm   Chronic anticoagulation    a.) Apixaban   Common biliary duct calculus    a.) CT 04/08/2021 --> CBD dilated at 8 mm; 86mm calculus within distal CBD.   Diastolic dysfunction    a.) TTE 07/04/2020 --> LVEF 60-65%; G1DD.   Hepatic cirrhosis (HCC)    Hepatic steatosis    Hepatitis 1970   History of 2019 novel coronavirus disease (COVID-19) 12/20/2019   History of marijuana use    Hypertension    Hypertensive retinopathy    IDA (iron deficiency anemia)    Mitral stenosis    a.) TEE 04/10/2020 --> mild. b.) TTE 07/04/2020 --> EF 60-65%; mild (mean gradient 5 mmHg).   Nephrolithiasis    NSTEMI (non-ST elevated myocardial infarction) ([place]) 07/26/2016   PAH (pulmonary artery hypertension) ([place])    a.) TTE 12/21/2019 --> PASP 44 mmHg.   Uterine fibroid    a.) CT 04/08/2021 --> multiple with largest measuring 7 cm.   Valvular regurgitation     a.) TTE 09/01/2010 --> trivial to mild pan-valvular. b.) TTE 12/21/2019 --> mild MR and AR; moderate TR. c.) TTE 07/04/2020 --> mild TR; trivial MR and PR.   Vestibular neuronitis     Surgical History: Past Surgical History:  Procedure Laterality Date   BREAST CYST ASPIRATION Left    COLONOSCOPY  2012   TEE WITH CARDIOVERSION N/A 02/04/2020   Procedure: TEE WITH CARDIOVERSION; Location: [facility]; Surgeon: Kandis Cocking, MD      Family History: Family History  Problem Relation Age of Onset   Aneurysm Mother    Colon cancer Father    Lung cancer Brother    Breast cancer Neg Hx     Social History:  reports that she has quit smoking. Her smoking use included cigarettes. She has a 1.00 pack-year smoking history. She has never used smokeless tobacco. She reports current alcohol use of about 5.0 standard drinks per week. She reports that she does not currently use drugs after having used the following drugs: Marijuana.  Physical Exam: BP (!) 153/103   Pulse 97   Temp (!) 96.5 F (35.8 C) (Tympanic)   Resp 18   Ht 4\' 11"  (1.499 m)   Wt 59 kg   SpO2 100%   BMI 26.26 kg/m    Constitutional:  Alert and oriented, No acute distress. Cardiovascular: irregularly irregular  Respiratory: CTA bilaterally GI: Abdomen  is soft, nontender, nondistended, no abdominal masses   Laboratory Data: Urine culture mixed flora  Assessment & Plan:   70 yo F with gross hematuria and bladder mass on CT, here for TURBT.  We discussed transurethral resection of bladder tumor (TURBT) and risks and benefits at length. This is typically a 1 to 2-hour procedure done under general anesthesia in the operating room.  A scope is inserted through the urethra and used to resect abnormal tissue within the bladder, which is then sent to the pathologist to determine grade and stage of the tumor.  Risks include bleeding, infection, need for temporary Foley placement, and bladder perforation.  Treatment strategies are based  on the type of tumor and depth of invasion.  We briefly reviewed the different treatment pathways for non-muscle invasive and muscle invasive bladder cancer.  TURBT today  Nickolas Madrid, MD 05/15/2021  [facility] [address], [place], [place] [address] [phone]

## 2021-05-15 NOTE — Transfer of Care (Signed)
Immediate Anesthesia Transfer of Care Note  Patient: Kristina Ellis  Procedure(s) Performed: TRANSURETHRAL RESECTION OF BLADDER TUMOR (TURBT)  Patient Location: PACU  Anesthesia Type:General  Level of Consciousness: awake, alert  and oriented  Airway & Oxygen Therapy: Patient Spontanous Breathing and Patient connected to face mask oxygen  Post-op Assessment: Report given to RN and Post -op Vital signs reviewed and stable  Post vital signs: Reviewed and stable  Last Vitals:  Vitals Value Taken Time  BP 163/114 05/15/21 1234  Temp 36.3 C 05/15/21 1235  Pulse    Resp 15 05/15/21 1238  SpO2 100   Vitals shown include unvalidated device data.  Last Pain:  Vitals:   05/15/21 0902  TempSrc: Tympanic  PainSc: 3          Complications: No notable events documented.

## 2021-05-15 NOTE — Anesthesia Procedure Notes (Signed)
Procedure Name: Intubation Date/Time: 05/15/2021 11:16 AM Performed by: Aline Brochure, CRNA Pre-anesthesia Checklist: Patient identified, Patient being monitored, Timeout performed, Emergency Drugs available and Suction available Patient Re-evaluated:Patient Re-evaluated prior to induction Oxygen Delivery Method: Circle system utilized Preoxygenation: Pre-oxygenation with 100% oxygen Induction Type: IV induction Ventilation: Mask ventilation without difficulty Laryngoscope Size: 3 and McGraph Grade View: Grade I Tube type: Oral Tube size: 7.0 mm Number of attempts: 1 Airway Equipment and Method: Stylet Placement Confirmation: ETT inserted through vocal cords under direct vision, positive ETCO2 and breath sounds checked- equal and bilateral Secured at: 21 cm Tube secured with: Tape Dental Injury: Teeth and Oropharynx as per pre-operative assessment

## 2021-05-15 NOTE — Discharge Instructions (Addendum)
Do not resume eliquis until Tuesday 10/11.   You underwent transurethral resection of a bladder tumor.  It is normal to have some pink to light red urine in the catheter.  The catheter will stay for 1 to 2 weeks to let the bladder heal.  The next steps depend on the pathology results from surgery which typically take about a week to return.  No strenuous activity for 2 weeks.  Okay to shower or take tub baths.   Call the clinic if you have fever over 101.3 or thick red urine like catch up in the tubing of the catheter.   Follow-up in clinic in 1 to 2 weeks for catheter removal and to discuss pathology results.   AMBULATORY SURGERY  DISCHARGE INSTRUCTIONS   The drugs that you were given will stay in your system until tomorrow so for the next 24 hours you should not:  Drive an automobile Make any legal decisions Drink any alcoholic beverage   You may resume regular meals tomorrow.  Today it is better to start with liquids and gradually work up to solid foods.  You may eat anything you prefer, but it is better to start with liquids, then soup and crackers, and gradually work up to solid foods.   Please notify your doctor immediately if you have any unusual bleeding, trouble breathing, redness and pain at the surgery site, drainage, fever, or pain not relieved by medication.           Please contact your physician with any problems or Same Day Surgery at [phone], Monday through Friday 6 am to 4 pm, or [place] at [facility] number at [phone].

## 2021-05-16 ENCOUNTER — Encounter: Payer: Self-pay | Admitting: Urology

## 2021-05-18 LAB — SURGICAL PATHOLOGY

## 2021-05-27 ENCOUNTER — Other Ambulatory Visit: Payer: Self-pay

## 2021-05-27 ENCOUNTER — Ambulatory Visit (INDEPENDENT_AMBULATORY_CARE_PROVIDER_SITE_OTHER): Payer: Medicare Other | Admitting: Urology

## 2021-05-27 ENCOUNTER — Encounter: Payer: Self-pay | Admitting: Urology

## 2021-05-27 VITALS — BP 126/84 | HR 49 | Ht 59.0 in

## 2021-05-27 DIAGNOSIS — Z466 Encounter for fitting and adjustment of urinary device: Secondary | ICD-10-CM | POA: Diagnosis not present

## 2021-05-27 DIAGNOSIS — C678 Malignant neoplasm of overlapping sites of bladder: Secondary | ICD-10-CM

## 2021-05-27 MED ORDER — SULFAMETHOXAZOLE-TRIMETHOPRIM 800-160 MG PO TABS
1.0000 | ORAL_TABLET | Freq: Once | ORAL | Status: AC
  Administered 2021-05-27: 1 via ORAL

## 2021-05-27 NOTE — Patient Instructions (Signed)
Bladder Cancer Bladder cancer is a condition in which abnormal tissue (a tumor) grows in the bladder. The bladder is the organ that holds urine. Two tubes (ureters) carry the urine from the kidneys to the bladder. The bladder wall is made of layers of tissue. Cancer that spreads through these layers of the bladder wall becomes more difficult to treat. What are the causes? The cause of this condition is not known. What increases the risk? The following factors may make you more likely to develop this condition: Smoking. Working where there are risks (occupational exposures), such as working with rubber, leather, clothing fabric, dyes, chemicals, and paint. Being 70 years of age or older. Being female. Having bladder inflammation that is long-term (chronic). Having a history of cancer, including: A family history of bladder cancer. Personal experience with bladder cancer. Having had certain treatments for cancer before. These include: Medicines to kill cancer cells (chemotherapy). Strong X-ray beams or capsules high in energy to kill cancer cells and shrink tumors (radiation therapy). Having been exposed to arsenic. This is a chemical element that can poison you. What are the signs or symptoms? Early symptoms of this condition include: Seeing blood in your urine. Feeling pain when urinating. Having infections of your urinary system (urinary tract infections or UTIs) that happen often. Having to urinate sooner or more often than usual. Later symptoms of this condition include: Not being able to urinate. Pain on one side of your lower back. Loss of appetite. Weight loss. Tiredness (fatigue). Swelling in your feet. Bone pain. How is this diagnosed? This condition is diagnosed based on: Your medical history. A physical exam. Lab tests, such as urine tests. Imaging tests. Your symptoms. You may also have other tests or procedures done, such as: A cystoscopy. A narrow tube is inserted  into your bladder through the organ that connects your bladder to the outside of your body (urethra). This is done to view the lining of your bladder for tumors. A biopsy. This procedure involves removing a tissue sample to look at it under a microscope to see if cancer is present. It is important to find out: How deeply into the bladder wall cancer has grown. Whether cancer has spread to any other parts of your body. This may require blood tests or imaging tests, such as a CT scan, MRI, bone scan, or X-rays. How is this treated? Your health care provider may recommend one or more types of treatment based on the stage of your cancer. The most common types of treatment are: Surgery to remove the cancer. Procedures that may be done include: Removing a tumor on the inside wall of the bladder (transurethral resection). Removing the bladder (cystectomy). Radiation therapy. This is often used together with chemotherapy. Chemotherapy. Immunotherapy. This uses medicines to help your immune system destroy cancer cells. Follow these instructions at home: Take over-the-counter and prescription medicines only as told by your health care provider. Eat a healthy diet. Some of your treatments might affect your appetite. Do not use any products that contain nicotine or tobacco, such as cigarettes, e-cigarettes, and chewing tobacco. If you need help quitting, ask your health care provider. Consider joining a support group. This may help you learn to cope with the stress of having bladder cancer. Tell your cancer care team if you develop side effects. Your team may be able to recommend ways to get relief. Keep all follow-up visits as told by your health care provider. This is important. Where to find more information American   Cancer Society: www.cancer.[place]): www.cancer.gov Contact a health care provider if: You have symptoms of a urinary tract infection. These  include: Fever. Chills. Weakness. Muscle aches. Pain in your abdomen. Urge to urinate that is stronger and happens more often than usual. Burning feeling in the bladder or urethra when you urinate. Get help right away if: There is blood in your urine. You cannot urinate. You have severe pain or other symptoms that do not go away. Summary Bladder cancer is a condition in which tumors grow in the bladder and cause illness. This condition is diagnosed based on your medical history, a physical exam, lab tests, imaging tests, and your symptoms. Your health care provider may recommend one or more types of treatment based on the stage of your cancer. Consider joining a support group. This may help you learn to cope with the stress of having bladder cancer. This information is not intended to replace advice given to you by your health care provider. Make sure you discuss any questions you have with your health care provider. Document Revised: 04/04/2019 Document Reviewed: 04/04/2019 Elsevier Patient Education  2022 [place].   Radical Cystectomy Radical cystectomy is a surgical procedure to remove the bladder. The bladder is the organ in the lower abdomen that holds urine until it is passed out of the body. You may have a radical cystectomy if you have bladder cancer that has spread into the bladder muscle, the lymph nodes, and other organs. Lymph nodes or other organs may need to be removed during this surgery. This procedure also includes a type of reconstructive surgery that allows a person to urinate without a bladder (urinary diversion). Tell a health care provider about: Any allergies you have. All medicines you are taking, including blood thinners, vitamins, herbs, eye drops, creams, and over-the-counter medicines. Any problems you or family members have had with anesthetic medicines. Any blood disorders you have. Any surgeries you have had. Any medical conditions you have or have  had. Whether you are pregnant or may be pregnant. What are the risks? Generally, this is a safe procedure. However, problems may occur, including: Infection. Bleeding. Damage to nearby organs, especially a part of your large intestine (rectum). Blockage in your intestine (bowel obstruction). Blood clots. Leaking or blockage of urine. Damage of the nerves in the pelvis. Problems having sex. The cancer coming back (recurrence). What happens before the procedure? Staying hydrated Follow instructions from your health care provider about hydration, which may include: Up to 2 hours before the procedure - you may continue to drink clear liquids, such as water, clear fruit juice, black coffee, and plain tea. Eating and drinking restrictions Follow instructions from your health care provider about eating and drinking, which may include: 8 hours before the procedure - stop eating heavy meals or foods, such as meat, fried foods, or fatty foods. 6 hours before the procedure - stop eating light meals or foods, such as toast or cereal. 6 hours before the procedure - stop drinking milk or drinks that contain milk. 2 hours before the procedure - stop drinking clear liquids. Medicines Ask your health care provider about: Changing or stopping your regular medicines. This is especially important if you are taking diabetes medicines or blood thinners. Taking medicines such as aspirin and ibuprofen. These medicines can thin your blood. Do not take these medicines unless your health care provider tells you to take them. Taking over-the-counter medicines, vitamins, herbs, and supplements. Safety Ask your health care provider:  If you should donate some of your blood before surgery in case you need to receive blood (transfusion) during the procedure. How your surgery site will be marked. What steps will be taken to help prevent infection. These may include: Removing hair at the surgery site. Washing skin  with a germ-killing soap. Taking antibiotic medicine. General instructions Plan to have someone take you home from the hospital or clinic. If you will be going home right after the procedure, plan to have someone with you for 24 hours. Do not drink alcohol or smoke cigarettes before your procedure as told by your health care provider. Follow your health care provider's instructions about cleaning out your bowels before surgery (bowel prep). What happens during the procedure?  An IV tube will be inserted into one of your veins. You will be given one or both of the following: A medicine to help you relax (sedative). A medicine to make you fall asleep (general anesthetic). A tube may be passed through your nose and into your stomach (NG tube). A narrow tube (catheter) may be inserted into your bladder to drain urine during and after surgery. Your health care provider will do the procedure using either a minimally invasive method or an open method. For minimally invasive radical cystectomy: 3-4 small incisions will be made in your abdomen. Carbon dioxide will be pumped into your abdomen to make it easy for your surgeon to see internal organs. A laparoscope and other small surgical instruments will be put through the incisions. In some cases, the surgeon will do the procedure by controlling tools that are attached to a surgical robot. This is called a robot-assisted partial cystectomy. For open radical cystectomy: A large incision will be made in your abdomen and down through the muscles to your bladder. The tissues and organs that are attached to your bladder will be cut. These include lymph nodes, the ureters, the urethra, and blood vessels. The following may also be removed: Prostate gland and glands that make semen (seminal vesicles) in men. Uterus, fallopian tubes, ovaries, and upper part of the vagina in women. Your surgeon will create a new way for urine to leave your body. Your surgeon  will pick the way that is best for you. You may have: An incontinent diversion. An opening in your skin (stoma) will be created using a short piece of your intestine. Urine will leave the body through the stoma. A continent diversion. A pouch to collect urine will be made from a part of your intestine. You will use a tube (catheter) to drain urine through a stoma. A neobladder. A new bladder will be made from a part of your intestine. This will allow urine to flow through your urethra like it did before surgery. The incision on your abdomen will be closed with stitches (sutures) or staples. A bandage (dressing) will be placed over your incision. If you have a stoma, a bag may be placed over it to collect urine (urostomy bag). If you have a neobladder, a catheter will be placed through your urethra to drain urine from it. A small plastic tube (surgical drain) may be placed to drain fluid from the surgery site as it heals. The procedure may vary among health care providers and hospitals. What happens after the procedure? Your blood pressure, heart rate, breathing rate, and blood oxygen level will be monitored until you leave the hospital or clinic. You will be given pain medicine as needed. You may also get antibiotics and fluids through your  IV tube. You will be urged to get up and walk around as soon as you can. Your IV tube and NG tube can be taken out after you can eat and drink on your own. If you have a neobladder, you may be sent home with a catheter. You will be shown how to care for the type of urinary diversion that you have. You may be sent home with a surgical drain. Summary Radical cystectomy is a surgical procedure to remove the bladder. It may be done if you have a cancer that has spread to the muscles of the bladder. This is a safe procedure. However, some problems may occur, including infection, damage to nearby organs, or leaking of urine. Follow instructions from your health  care provider about how to get ready for the procedure. After the procedure, you will be monitored closely and encouraged to walk around. This information is not intended to replace advice given to you by your health care provider. Make sure you discuss any questions you have with your health care provider. Document Revised: 10/02/2018 Document Reviewed: 10/02/2018 Elsevier Patient Education  [place].

## 2021-05-27 NOTE — Progress Notes (Signed)
   05/27/2021 10:29 AM   Kristina Ellis 13-Feb-1951 [id number]  Reason for visit: Discuss TURBT pathology  HPI: 70 year old comorbid female who presented with pelvic pain and gross hematuria and on CT was found to have a large bladder tumor.  She underwent a TURBT on 05/15/2021 with removal of all visible tumor.  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  Her Foley was removed today, with a prophylactic dose of Bactrim given.  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    We discussed primary treatment options include neoadjuvant chemotherapy followed by radical cystoprostatectomy with urinary diversion, versus tri-modal therapy with a bladder sparing approach.  Cystectomy and diversion is a big surgery with 5 to 10-day hospitalization, risks of bleeding, infection, injury to surrounding structures, ileus, prolonged recovery, and a readmission rate of nearly 30%.  We discussed this is done both open and robotically, and is dependent on surgeon preference and experience, but both have similar oncologic outcomes.  We discussed that long-term prognosis is dependent on numerous factors, including response to neoadjuvant chemotherapy, and most importantly final staging after cystectomy.  We discussed options for diversion including ileal conduit (most common), neobladder, and [place] pouch, and the differences in recovery and risks/benefits of the different diversions.   Tri-modal therapy includes maximal TURBT, chemotherapy, radiation, and close long-term surveillance with clinic cystoscopy and CT scans.  There is less evidence about the long-term durability of tri-modal therapy versus neoadjuvant chemotherapy and cystectomy, but may be appropriate in carefully selected patients ( I.e solitary tumor, ability to maximally resect disease endoscopically, strong desire for bladder  sparing approach, or patients that have co-morbidities that preclude eligibility for cystectomy).  With her comorbidities and frailty, she is certainly high risk for aggressive treatment with cystectomy, but this would provide the best chance of cure.  She would not be a candidate for a partial cystectomy based on the extent and multifocality of her disease.  She is very averse to cystectomy and urinary diversion, but is willing to meet with Dr. Tresa Moore in [place] to discuss further.  I also placed a referral to oncology and radiation oncology here at [facility] for consideration of potential tri-modal therapy.  Anticipate need for CT chest or chest x-ray to complete staging.  RTC with me 4 to 5 weeks to finalize treatment decision   Billey Co, MD  [facility] [address], [place], [place] [address] [phone]

## 2021-05-27 NOTE — Progress Notes (Signed)
Catheter Removal  Patient is present today for a catheter removal. 53ml of water was drained from the balloon. A 22FR foley cath was removed from the bladder no complications were noted. Patient tolerated well. Patient was given one time dose of Bactrim 800/160mg  per verbal orders from Dr. Diamantina Providence.  Performed by: Gordy Clement, CMA  Follow up/ Additional notes: RTC in 5 weeks as scheduled.

## 2021-05-29 DIAGNOSIS — C679 Malignant neoplasm of bladder, unspecified: Secondary | ICD-10-CM | POA: Insufficient documentation

## 2021-05-29 DIAGNOSIS — C689 Malignant neoplasm of urinary organ, unspecified: Secondary | ICD-10-CM | POA: Insufficient documentation

## 2021-05-29 NOTE — Progress Notes (Signed)
[facility]  [phone] [phone]) [phone]  ID: Kristina Ellis OB: Jul 18, 1951  MR#: [id number]  PJK#:[id number]  Patient Care Team: [facility], [place] as PCP - General (General Practice)  CHIEF COMPLAINT: Stage II bladder cancer.  INTERVAL HISTORY: Patient is a 70 year old female who initially presented with hematuria.  On work-up she was noted to have a 3.2 cm bladder mass with muscular invasion.  She is not a candidate for cystectomy therefore underwent TURBT and was referred for evaluation and consideration of adjuvant chemotherapy with concurrent XRT.  She currently feels well and is at her baseline.  She has no further hematuria.  She has no neurologic complaints.  She denies any recent fevers or illnesses.  She has a good appetite and denies weight loss.  She has no chest pain, shortness of breath, cough, or hemoptysis.  She denies any nausea, vomiting, constipation, or diarrhea.  She has no other urinary complaints.  Patient offers no further specific complaints today.  REVIEW OF SYSTEMS:   Review of Systems  Constitutional: Negative.  Negative for fever, malaise/fatigue and weight loss.  Respiratory: Negative.  Negative for cough, hemoptysis and shortness of breath.   Cardiovascular: Negative.  Negative for chest pain and leg swelling.  Gastrointestinal: Negative.  Negative for abdominal pain.  Genitourinary:  Positive for hematuria.  Musculoskeletal: Negative.  Negative for back pain.  Skin: Negative.  Negative for rash.  Neurological: Negative.  Negative for dizziness, seizures, weakness and headaches.  Psychiatric/Behavioral: Negative.  The patient is not nervous/anxious.    As per HPI. Otherwise, a complete review of systems is negative.  PAST MEDICAL HISTORY: Past Medical History:  Diagnosis Date   Aortic atherosclerosis ([place])    Arthritis    Atrial fibrillation ([place])    a.) s/p TEE with cardioversion on 02/04/2020. b.)  on daily apixaban.   Bladder mass    a.) CT 04/08/2021 --> 3.2 cm intraluminal bladder mass.   Carotid stenosis, bilateral    a.) Doppler 07/04/2020 --> mild; 1-49% stenosis BILATERALLY.   Cholelithiasis    a.) CT 11/06/2020 --> largest measured 4.5 cm   Chronic anticoagulation    a.) Apixaban   Common biliary duct calculus    a.) CT 04/08/2021 --> CBD dilated at 8 mm; 37mm calculus within distal CBD.   Diastolic dysfunction    a.) TTE 07/04/2020 --> LVEF 60-65%; G1DD.   Hepatic cirrhosis (H[facility])    Hepatic steatosis    Hepatitis 1970   History of 2019 novel coronavirus disease (COVID-19) 12/20/2019   History of marijuana use    Hypertension    Hypertensive retinopathy    IDA (iron deficiency anemia)    Mitral stenosis    a.) TEE 04/10/2020 --> mild. b.) TTE 07/04/2020 --> EF 60-65%; mild (mean gradient 5 mmHg).   Nephrolithiasis    NSTEMI (non-ST elevated myocardial infarction) ([place]) 07/26/2016   PAH (pulmonary artery hypertension) ([place])    a.) TTE 12/21/2019 --> PASP 44 mmHg.   Uterine fibroid    a.) CT 04/08/2021 --> multiple with largest measuring 7 cm.   Valvular regurgitation    a.) TTE 09/01/2010 --> trivial to mild pan-valvular. b.) TTE 12/21/2019 --> mild MR and AR; moderate TR. c.) TTE 07/04/2020 --> mild TR; trivial MR and PR.   Vestibular neuronitis     PAST SURGICAL HISTORY: Past Surgical History:  Procedure Laterality Date   BREAST CYST ASPIRATION Left    COLONOSCOPY  2012   TEE WITH CARDIOVERSION N/A  02/04/2020   Procedure: TEE WITH CARDIOVERSION; Location: [facility]; Surgeon: Kandis Cocking, MD   TRANSURETHRAL RESECTION OF BLADDER TUMOR N/A 05/15/2021   Procedure: TRANSURETHRAL RESECTION OF BLADDER TUMOR (TURBT);  Surgeon: Billey Co, MD;  Location: [facility] ORS;  Service: Urology;  Laterality: N/A;    FAMILY HISTORY: Family History  Problem Relation Age of Onset   Aneurysm Mother    Colon cancer Father    Lung cancer Brother    Breast cancer Neg Hx      ADVANCED DIRECTIVES (Y/N):  N  HEALTH MAINTENANCE: Social History   Tobacco Use   Smoking status: Former    Packs/day: 0.10    Years: 10.00    Pack years: 1.00    Types: Cigarettes   Smokeless tobacco: Never   Tobacco comments:    occasional smoker  Vaping Use   Vaping Use: Never used  Substance Use Topics   Alcohol use: Yes    Alcohol/week: 5.0 standard drinks    Types: 5 Cans of beer per week    Comment: weekly   Drug use: Not Currently    Types: 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     ELIQUIS 5 MG TABS tablet Take 5 mg by mouth 2 (two) times daily.     FEROSUL 325 (65 Fe) MG tablet Take 325 mg by mouth daily.     magnesium oxide (MAG-OX) 400 MG tablet Take 1 tablet by mouth 2 (two) times daily.     metoprolol succinate (TOPROL-XL) 100 MG 24 hr tablet Take 1 tablet (100 mg total) by mouth daily. Take with or immediately following a meal. 30 tablet 3   trolamine salicylate (ASPERCREME) 10 % cream Apply 1 application topically as needed for muscle pain.     No current facility-administered medications for this visit.    OBJECTIVE: Vitals:   06/04/21 1458  BP: (!) 155/102  Pulse: (!) 122  Resp: 16  Temp: (!) 97.1 F (36.2 C)     Body mass index is 26.86 kg/m.    ECOG FS:1 - Symptomatic but completely ambulatory  General: Well-developed, well-nourished, no acute distress.  Sitting in a wheelchair. Eyes: Pink conjunctiva, anicteric sclera. HEENT: Normocephalic, moist mucous membranes. Lungs: No audible wheezing or coughing. Heart: Regular rate and rhythm. Abdomen: Soft, nontender, no obvious distention. Musculoskeletal: No edema, cyanosis, or  clubbing. Neuro: Alert, answering all questions appropriately. Cranial nerves grossly intact. Skin: No rashes or petechiae noted. Psych: Normal affect. Lymphatics: No cervical, calvicular, axillary or inguinal LAD.   LAB RESULTS:  Lab Results  Component Value Date   NA 144 05/15/2021   K 4.3 05/15/2021   CL 110 05/15/2021   CO2 23 05/07/2021   GLUCOSE 97 05/15/2021   BUN 7 (L) 05/15/2021   CREATININE 0.70 05/15/2021   CALCIUM 8.7 (L) 05/07/2021   PROT 7.7 03/07/2018   ALBUMIN 2.8 (L) 03/07/2018   AST 68 (H) 03/07/2018   ALT 34 03/07/2018   ALKPHOS 73 03/07/2018   BILITOT 1.2 03/07/2018   GFRNONAA >60 05/07/2021   GFRAA >60 03/07/2018    Lab Results  Component Value Date   WBC 3.6 (L)  05/07/2021   NEUTROABS 1.6 (L) 07/03/2020   HGB 13.9 05/15/2021   HCT 41.0 05/15/2021   MCV 99.2 05/07/2021   PLT 163 05/07/2021     STUDIES: No results found.  ASSESSMENT: Stage II bladder cancer.  PLAN:    Stage II bladder cancer: Pathology and imaging reviewed independently.  Since patient is not a surgical candidate she underwent TURBT and plan to give concurrent weekly cisplatin 35 mg/m2 along with daily XRT.  Patient had consultation with radiation oncology today as well.  Prior to initiating treatment we will get a PET scan to complete the staging work-up.  Patient does not require port, but would consider one in the future if IV access became difficult.  Return to clinic on June 23, 2021 for further evaluation and initiation of cycle 1 of weekly cisplatin.  I spent a total of 60 minutes reviewing chart data, face-to-face evaluation with the patient, counseling and coordination of care as detailed above.   Patient expressed understanding and was in agreement with this plan. She also understands that She can call clinic at any time with any questions, concerns, or complaints.   Cancer Staging Bladder cancer [facility]) Staging form: Urinary Bladder, AJCC 8th Edition - Clinical  stage from 06/04/2021: Stage II (cT2, cN0, cM0) - Signed by Lloyd Huger, MD on 06/04/2021 WHO/ISUP grade (low/high): High Grade Histologic grading system: 2 grade system  Lloyd Huger, MD   06/04/2021 3:30 PM

## 2021-06-04 ENCOUNTER — Other Ambulatory Visit: Payer: Self-pay

## 2021-06-04 ENCOUNTER — Encounter: Payer: Self-pay | Admitting: Oncology

## 2021-06-04 ENCOUNTER — Inpatient Hospital Stay: Payer: Medicare Other

## 2021-06-04 ENCOUNTER — Inpatient Hospital Stay: Payer: Medicare Other | Attending: Oncology | Admitting: Oncology

## 2021-06-04 ENCOUNTER — Ambulatory Visit
Admission: RE | Admit: 2021-06-04 | Discharge: 2021-06-04 | Disposition: A | Discharge status: Home / Self Care | Payer: Medicare Other | Source: Ambulatory Visit | Attending: Radiation Oncology | Admitting: Radiation Oncology

## 2021-06-04 VITALS — BP 155/102 | HR 122 | Temp 97.1°F | Resp 16 | Wt 133.0 lb

## 2021-06-04 DIAGNOSIS — C678 Malignant neoplasm of overlapping sites of bladder: Secondary | ICD-10-CM

## 2021-06-04 DIAGNOSIS — Z801 Family history of malignant neoplasm of trachea, bronchus and lung: Secondary | ICD-10-CM | POA: Diagnosis not present

## 2021-06-04 DIAGNOSIS — I10 Essential (primary) hypertension: Secondary | ICD-10-CM | POA: Insufficient documentation

## 2021-06-04 DIAGNOSIS — C679 Malignant neoplasm of bladder, unspecified: Secondary | ICD-10-CM | POA: Diagnosis not present

## 2021-06-04 DIAGNOSIS — Z87891 Personal history of nicotine dependence: Secondary | ICD-10-CM | POA: Diagnosis not present

## 2021-06-04 DIAGNOSIS — Z808 Family history of malignant neoplasm of other organs or systems: Secondary | ICD-10-CM | POA: Insufficient documentation

## 2021-06-04 DIAGNOSIS — C689 Malignant neoplasm of urinary organ, unspecified: Secondary | ICD-10-CM

## 2021-06-04 NOTE — Consult Note (Signed)
NEW PATIENT EVALUATION  Name: Kristina Ellis  MRN: [id number]  Date:   06/04/2021     DOB: 05-Apr-1951   This 70 y.o. 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   PREVIOUS INVESTIGATIONS:  Pathology report reviewed CT scans reviewed Clinical notes reviewed  HPI: Patient is a 70 year old female who presented with hematuria.  CT scan demonstrated a 3.2 cm intraluminal mass in urinary bladder consistent with bladder carcinoma.  No evidence of abdominal pelvic metastasis was noted.  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  She has been over treatment options including success cystectomy with urinary diversion versus try modal treatment with adjuvant chemoradiation therapy.  Patient has refused surgical intervention she is now seen for possible of concurrent chemoradiation therapy.  She is not having any further hematuria at this time she does not have any pelvic pain or discomfort.  Her bowels are functioning fairly well.  PLANNED TREATMENT REGIMEN: Concurrent chemoradiation  PAST MEDICAL HISTORY:  has a past medical history of Aortic atherosclerosis ([place]), Arthritis, Atrial fibrillation ([place]), Bladder mass, Carotid stenosis, bilateral, Cholelithiasis, Chronic anticoagulation, Common biliary duct calculus, Diastolic dysfunction, Hepatic cirrhosis ([place]), Hepatic steatosis, Hepatitis (1970), History of 2019 novel coronavirus disease (COVID-19) (12/20/2019), History of marijuana use, Hypertension, Hypertensive retinopathy, IDA (iron deficiency anemia), Mitral stenosis, Nephrolithiasis, NSTEMI  (non-ST elevated myocardial infarction) ([place]) (07/26/2016), PAH (pulmonary artery hypertension) ([place]), Uterine fibroid, Valvular regurgitation, and Vestibular neuronitis.    PAST SURGICAL HISTORY:  Past Surgical History:  Procedure Laterality Date   BREAST CYST ASPIRATION Left    COLONOSCOPY  2012   TEE WITH CARDIOVERSION N/A 02/04/2020   Procedure: TEE WITH CARDIOVERSION; Location: [facility]; Surgeon: Kandis Cocking, MD   TRANSURETHRAL RESECTION OF BLADDER TUMOR N/A 05/15/2021   Procedure: TRANSURETHRAL RESECTION OF BLADDER TUMOR (TURBT);  Surgeon: Billey Co, MD;  Location: [facility] OR[facility];  Service: Urology;  Laterality: N/A;    FAMILY HISTORY: family history includes Aneurysm in her mother; Colon cancer in her father; Lung cancer in her brother.  SOCIAL HISTORY:  reports that she has quit smoking. Her smoking use included cigarettes. She has a 1.00 pack-year smoking history. She has never used smokeless tobacco. She reports current alcohol use of about 5.0 standard drinks per week. She reports that she does not currently use drugs after having used the following drugs: Marijuana.  ALLERGIES: Atenolol and Penicillins  MEDICATIONS:  Current Outpatient Medications  Medication Sig Dispense Refill   amLODipine (NORVASC) 10 MG tablet Take 10 mg by mouth daily.     ELIQUIS 5 MG TABS tablet Take 5 mg by mouth 2 (two) times daily.     FEROSUL 325 (65 Fe) MG tablet Take 325 mg by mouth daily.     magnesium oxide (MAG-OX) 400 MG tablet Take 1 tablet by mouth 2 (two) times daily.     metoprolol succinate (TOPROL-XL) 100 MG 24 hr tablet Take 1 tablet (100 mg total) by mouth daily. Take with or immediately following a meal. 30 tablet 3   trolamine salicylate (ASPERCREME) 10 % cream Apply 1 application topically as needed for muscle pain.     No current facility-administered  medications for this encounter.    ECOG PERFORMANCE STATUS:  0 - Asymptomatic  REVIEW OF SYSTEMS: Patient denies any weight loss,  fatigue, weakness, fever, chills or night sweats. Patient denies any loss of vision, blurred vision. Patient denies any ringing  of the ears or hearing loss. No irregular heartbeat. Patient denies heart murmur or history of fainting. Patient denies any chest pain or pain radiating to her upper extremities. Patient denies any shortness of breath, difficulty breathing at night, cough or hemoptysis. Patient denies any swelling in the lower legs. Patient denies any nausea vomiting, vomiting of blood, or coffee ground material in the vomitus. Patient denies any stomach pain. Patient states has had normal bowel movements no significant constipation or diarrhea. Patient denies any dysuria, hematuria or significant nocturia. Patient denies any problems walking, swelling in the joints or loss of balance. Patient denies any skin changes, loss of hair or loss of weight. Patient denies any excessive worrying or anxiety or significant depression. Patient denies any problems with insomnia. Patient denies excessive thirst, polyuria, polydipsia. Patient denies any swollen glands, patient denies easy bruising or easy bleeding. Patient denies any recent infections, allergies or URI. Patient "s visual fields have not changed significantly in recent time.   PHYSICAL EXAM: BP (!) 155/102   Pulse (!) 122   Temp (!) 97.1 F (36.2 C) (Tympanic)   Resp 16   Wt 133 lb 3.2 oz (60.4 kg)   BMI 26.90 kg/m  Well-developed well-nourished patient in NAD. HEENT reveals PERLA, EOMI, discs not visualized.  Oral cavity is clear. No oral mucosal lesions are identified. Neck is clear without evidence of cervical or supraclavicular adenopathy. Lungs are clear to A&P. Cardiac examination is essentially unremarkable with regular rate and rhythm without murmur rub or thrill. Abdomen is benign with no organomegaly or masses noted. Motor sensory and DTR levels are equal and symmetric in the upper and lower extremities. Cranial nerves II through XII  are grossly intact. Proprioception is intact. No peripheral adenopathy or edema is identified. No motor or sensory levels are noted. Crude visual fields are within normal range.  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  She is already had maximum TURBT.  I would reck recommend concurrent chemotherapy.  Radiation would be 45 Gray to her whole pelvis boosting her bladder another 1000 cGy and another 1000 cGy boost to the area of previous tumor involvement.  Risks and benefits of treatment including increased lower urinary tract symptoms diarrhea fatigue alteration of blood counts skin reaction all were reviewed with the patient.  I put up for simulation till the middle of next week to allow medical oncology to plan chemotherapy concurrently.  Patient comprehends my recommendations well.  I would like to take this opportunity to thank you for allowing me to participate in the care of your patient.Noreene Filbert, MD

## 2021-06-04 NOTE — Progress Notes (Signed)
START ON PATHWAY REGIMEN - Bladder     A cycle is every 7 days:     Cisplatin   **Always confirm dose/schedule in your pharmacy ordering system**  Patient Characteristics: Pre-Cystectomy or Nonsurgical Candidate (Clinical Staging), cT2-4a, cN0-1, M0, Cystectomy Ineligible Therapeutic Status: Pre-Cystectomy or Nonsurgical Candidate (Clinical Staging) AJCC M Category: cM0 AJCC 8 Stage Grouping: II AJCC T Category: cT2 AJCC N Category: cN0 Intent of Therapy: Curative Intent, Discussed with Patient

## 2021-06-04 NOTE — Progress Notes (Signed)
Patient here for initial consult of urothelial carcinoma. Patient also saw Dr. Baruch Gouty in radiation today. Heart rate and BP are elevated today. Patient states that she is nervous and forgot to take her prescribed medications today. Patient reports a history of afib and high blood pressure. No complaints at this time.

## 2021-06-05 ENCOUNTER — Encounter: Payer: Self-pay | Admitting: Oncology

## 2021-06-05 MED ORDER — ONDANSETRON HCL 8 MG PO TABS: 8.0000 mg | ORAL_TABLET | Freq: Two times a day (BID) | ORAL | 1 refills | Status: DC | PRN

## 2021-06-05 MED ORDER — PROCHLORPERAZINE MALEATE 10 MG PO TABS: 10.0000 mg | ORAL_TABLET | Freq: Four times a day (QID) | ORAL | 1 refills | Status: DC | PRN

## 2021-06-11 ENCOUNTER — Ambulatory Visit
Admission: RE | Admit: 2021-06-11 | Discharge: 2021-06-11 | Disposition: A | Discharge status: Home / Self Care | Payer: Medicare Other | Source: Ambulatory Visit | Attending: Radiation Oncology | Admitting: Radiation Oncology

## 2021-06-11 DIAGNOSIS — D72819 Decreased white blood cell count, unspecified: Secondary | ICD-10-CM | POA: Diagnosis not present

## 2021-06-11 DIAGNOSIS — Z5111 Encounter for antineoplastic chemotherapy: Secondary | ICD-10-CM | POA: Insufficient documentation

## 2021-06-11 DIAGNOSIS — Z510 Encounter for antineoplastic radiation therapy: Secondary | ICD-10-CM | POA: Diagnosis present

## 2021-06-11 DIAGNOSIS — C679 Malignant neoplasm of bladder, unspecified: Secondary | ICD-10-CM | POA: Insufficient documentation

## 2021-06-12 ENCOUNTER — Inpatient Hospital Stay: Payer: Medicare Other

## 2021-06-12 ENCOUNTER — Other Ambulatory Visit: Payer: Self-pay

## 2021-06-12 DIAGNOSIS — Z5111 Encounter for antineoplastic chemotherapy: Secondary | ICD-10-CM | POA: Insufficient documentation

## 2021-06-12 DIAGNOSIS — D72819 Decreased white blood cell count, unspecified: Secondary | ICD-10-CM | POA: Insufficient documentation

## 2021-06-12 DIAGNOSIS — C679 Malignant neoplasm of bladder, unspecified: Secondary | ICD-10-CM | POA: Insufficient documentation

## 2021-06-15 NOTE — Progress Notes (Signed)
Pharmacist Chemotherapy Monitoring - Initial Assessment    Anticipated start date: 06/23/21   The following has been reviewed per standard work regarding the patient's treatment regimen: The patient's diagnosis, treatment plan and drug doses, and organ/hematologic function Lab orders and baseline tests specific to treatment regimen  The treatment plan start date, drug sequencing, and pre-medications Prior authorization status  Patient's documented medication list, including drug-drug interaction screen and prescriptions for anti-emetics and supportive care specific to the treatment regimen The drug concentrations, fluid compatibility, administration routes, and timing of the medications to be used The patient's access for treatment and lifetime cumulative dose history, if applicable  The patient's medication allergies and previous infusion related reactions, if applicable   Changes made to treatment plan:  N/A  Follow up needed:  [place], [facility], 06/15/2021  2:33 PM

## 2021-06-16 DIAGNOSIS — Z510 Encounter for antineoplastic radiation therapy: Secondary | ICD-10-CM | POA: Diagnosis not present

## 2021-06-17 ENCOUNTER — Ambulatory Visit
Admission: RE | Admit: 2021-06-17 | Discharge: 2021-06-17 | Disposition: A | Discharge status: Home / Self Care | Payer: Medicare Other | Source: Ambulatory Visit | Attending: Oncology | Admitting: Oncology

## 2021-06-17 ENCOUNTER — Other Ambulatory Visit: Payer: Self-pay

## 2021-06-17 DIAGNOSIS — C678 Malignant neoplasm of overlapping sites of bladder: Secondary | ICD-10-CM | POA: Diagnosis not present

## 2021-06-17 DIAGNOSIS — K7460 Unspecified cirrhosis of liver: Secondary | ICD-10-CM | POA: Insufficient documentation

## 2021-06-17 DIAGNOSIS — R599 Enlarged lymph nodes, unspecified: Secondary | ICD-10-CM | POA: Insufficient documentation

## 2021-06-17 DIAGNOSIS — K760 Fatty (change of) liver, not elsewhere classified: Secondary | ICD-10-CM | POA: Diagnosis not present

## 2021-06-17 DIAGNOSIS — I700 Atherosclerosis of aorta: Secondary | ICD-10-CM | POA: Insufficient documentation

## 2021-06-17 DIAGNOSIS — I2510 Atherosclerotic heart disease of native coronary artery without angina pectoris: Secondary | ICD-10-CM | POA: Diagnosis not present

## 2021-06-17 LAB — GLUCOSE, CAPILLARY: Glucose-Capillary: 93 mg/dL (ref 70–99)

## 2021-06-17 MED ORDER — FLUDEOXYGLUCOSE F - 18 (FDG) INJECTION
6.9000 | Freq: Once | INTRAVENOUS | Status: AC | PRN
  Administered 2021-06-17: 7.7 via INTRAVENOUS

## 2021-06-18 ENCOUNTER — Ambulatory Visit: Admission: RE | Admit: 2021-06-18 | Payer: Medicare Other | Source: Ambulatory Visit

## 2021-06-18 DIAGNOSIS — Z510 Encounter for antineoplastic radiation therapy: Secondary | ICD-10-CM | POA: Diagnosis not present

## 2021-06-18 NOTE — Progress Notes (Signed)
[name]  Telephone:([phone] Fax:([phone]) [phone]  ID: Kristina Ellis OB: Apr 14, 1951  MR#: [id number]  VZD#:[id number]  Patient Care Team: [facility], [place] as PCP - General (General Practice)  CHIEF COMPLAINT: Stage II bladder cancer.  INTERVAL HISTORY: Patient returns to clinic today for further evaluation and initiation of cycle 1 of weekly cisplatin.  She initiated XRT yesterday.  She continues to have chronic weakness and fatigue, but otherwise feels well.  She has no further hematuria.  She has no neurologic complaints.  She denies any recent fevers or illnesses.  She has a good appetite and denies weight loss.  She has no chest pain, shortness of breath, cough, or hemoptysis.  She denies any nausea, vomiting, constipation, or diarrhea.  She has no other urinary complaints.  Patient offers no further specific complaints today.    REVIEW OF SYSTEMS:   Review of Systems  Constitutional:  Positive for malaise/fatigue. Negative for fever and weight loss.  Respiratory: Negative.  Negative for cough, hemoptysis and shortness of breath.   Cardiovascular: Negative.  Negative for chest pain and leg swelling.  Gastrointestinal: Negative.  Negative for abdominal pain.  Genitourinary:  Negative for hematuria.  Musculoskeletal: Negative.  Negative for back pain.  Skin: Negative.  Negative for rash.  Neurological:  Positive for weakness. Negative for dizziness, seizures and headaches.  Psychiatric/Behavioral: Negative.  The patient is not nervous/anxious.    As per HPI. Otherwise, a complete review of systems is negative.  PAST MEDICAL HISTORY: Past Medical History:  Diagnosis Date   Aortic atherosclerosis ([place])    Arthritis    Atrial fibrillation ([place])    a.) s/p TEE with cardioversion on 02/04/2020. b.) on daily apixaban.   Bladder mass    a.) CT 04/08/2021 --> 3.2 cm intraluminal bladder mass.   Carotid stenosis, bilateral    a.) Doppler  07/04/2020 --> mild; 1-49% stenosis BILATERALLY.   Cholelithiasis    a.) CT 11/06/2020 --> largest measured 4.5 cm   Chronic anticoagulation    a.) Apixaban   Common biliary duct calculus    a.) CT 04/08/2021 --> CBD dilated at 8 mm; 53mm calculus within distal CBD.   Diastolic dysfunction    a.) TTE 07/04/2020 --> LVEF 60-65%; G1DD.   Hepatic cirrhosis ([facility])    Hepatic steatosis    Hepatitis 1970   History of 2019 novel coronavirus disease (COVID-19) 12/20/2019   History of marijuana use    Hypertension    Hypertensive retinopathy    IDA (iron deficiency anemia)    Mitral stenosis    a.) TEE 04/10/2020 --> mild. b.) TTE 07/04/2020 --> EF 60-65%; mild (mean gradient 5 mmHg).   Nephrolithiasis    NSTEMI (non-ST elevated myocardial infarction) ([place]) 07/26/2016   PAH (pulmonary artery hypertension) ([place])    a.) TTE 12/21/2019 --> PASP 44 mmHg.   Uterine fibroid    a.) CT 04/08/2021 --> multiple with largest measuring 7 cm.   Valvular regurgitation    a.) TTE 09/01/2010 --> trivial to mild pan-valvular. b.) TTE 12/21/2019 --> mild MR and AR; moderate TR. c.) TTE 07/04/2020 --> mild TR; trivial MR and PR.   Vestibular neuronitis     PAST SURGICAL HISTORY: Past Surgical History:  Procedure Laterality Date   BREAST CYST ASPIRATION Left    COLONOSCOPY  2012   TEE WITH CARDIOVERSION N/A 02/04/2020   Procedure: TEE WITH CARDIOVERSION; Location: [facility]; Surgeon: Kandis Cocking, MD   TRANSURETHRAL RESECTION OF BLADDER TUMOR N/A 05/15/2021  Procedure: TRANSURETHRAL RESECTION OF BLADDER TUMOR (TURBT);  Surgeon: Billey Co, MD;  Location: [facility];  Service: Urology;  Laterality: N/A;    FAMILY HISTORY: Family History  Problem Relation Age of Onset   Aneurysm Mother    Colon cancer Father    Lung cancer Brother    Breast cancer Neg Hx     ADVANCED DIRECTIVES (Y/N):  N  HEALTH MAINTENANCE: Social History   Tobacco Use   Smoking status: Former    Packs/day: 0.10    Years: 10.00     Pack years: 1.00    Types: Cigarettes   Smokeless tobacco: Never   Tobacco comments:    occasional smoker  Vaping Use   Vaping Use: Never used  Substance Use Topics   Alcohol use: Yes    Alcohol/week: 5.0 standard drinks    Types: 5 Cans of beer per week    Comment: weekly   Drug use: Not Currently    Types: 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     ELIQUIS 5 MG TABS tablet Take 5 mg by mouth 2 (two) times daily.     FEROSUL 325 (65 Fe) MG tablet Take 325 mg by mouth daily.     magnesium oxide (MAG-OX) 400 MG tablet Take 1 tablet by mouth 2 (two) times daily.     metoprolol succinate (TOPROL-XL) 100 MG 24 hr tablet Take 1 tablet (100 mg total) by mouth daily. Take with or immediately following a meal. 30 tablet 3   ondansetron (ZOFRAN) 8 MG tablet Take 1 tablet (8 mg total) by mouth 2 (two) times daily as needed. Start on the third day after cisplatin chemotherapy. 60 tablet 1   prochlorperazine (COMPAZINE) 10 MG tablet Take 1 tablet (10 mg total) by mouth every 6 (six) hours as needed (Nausea or vomiting). 60 tablet 1   trolamine salicylate (ASPERCREME) 10 % cream Apply 1 application topically as needed for muscle pain.     No current facility-administered medications for this visit.   Facility-Administered Medications Ordered in Other Visits  Medication Dose Route Frequency Provider Last Rate Last Admin   CISplatin (PLATINOL) 55 mg in sodium chloride 0.9 % 250 mL chemo infusion  35 mg/m2 (Treatment Plan Recorded) Intravenous Once Lloyd Huger, MD       dexamethasone (DECADRON) 10 mg in sodium chloride 0.9 % 50 mL IVPB  10 mg Intravenous Once Lloyd Huger, MD        fosaprepitant (EMEND) 150 mg in sodium chloride 0.9 % 145 mL IVPB  150 mg Intravenous Once Lloyd Huger, MD       magnesium sulfate IVPB 2 g 50 mL  2 g Intravenous Once Lloyd Huger, MD       magnesium sulfate IVPB 4 g 100 mL  4 g Intravenous Once Lloyd Huger, MD 50 mL/hr at 06/23/21 1055 4 g at 06/23/21 1055   palonosetron (ALOXI) injection 0.25 mg  0.25 mg Intravenous Once Lloyd Huger, MD        OBJECTIVE: Vitals:   06/23/21 0953  BP: (!) 128/99  Pulse: 68  Resp: 17  Temp: (!) 97.1 F (36.2 C)  SpO2: 100%     Body mass index is 26.46 kg/m.    ECOG FS:1 - Symptomatic but completely ambulatory  General: Well-developed, well-nourished, no acute distress.  Sitting in a wheelchair. Eyes: Pink conjunctiva, anicteric sclera. HEENT: Normocephalic, moist mucous membranes. Lungs: No audible wheezing or coughing. Heart: Regular rate and rhythm. Abdomen: Soft, nontender, no obvious distention. Musculoskeletal: No edema, cyanosis, or clubbing. Neuro: Alert, answering all questions appropriately. Cranial nerves grossly intact. Skin: No rashes or petechiae noted. Psych: Normal affect.   LAB RESULTS:  Lab Results  Component Value Date   NA 135 06/23/2021   K 4.0 06/23/2021   CL 99 06/23/2021   CO2 25 06/23/2021   GLUCOSE 107 (H) 06/23/2021   BUN 9 06/23/2021   CREATININE 0.67 06/23/2021   CALCIUM 8.6 (L) 06/23/2021   PROT 7.7 03/07/2018   ALBUMIN 2.8 (L) 03/07/2018   AST 68 (H) 03/07/2018   ALT 34 03/07/2018   ALKPHOS 73 03/07/2018   BILITOT 1.2 03/07/2018   GFRNONAA >60 06/23/2021   GFRAA >60 03/07/2018    Lab Results  Component Value Date   WBC 2.9 (L) 06/23/2021   NEUTROABS 1.3 (L) 06/23/2021   HGB 12.9 06/23/2021   HCT 37.4 06/23/2021   MCV 97.4 06/23/2021   PLT 156 06/23/2021     STUDIES: NM PET Image Initial (PI) Skull Base To Thigh  Result Date: 06/18/2021 CLINICAL DATA:  Subsequent treatment strategy for bladder cancer.  EXAM: NUCLEAR MEDICINE PET SKULL BASE TO THIGH TECHNIQUE: 7.7 mCi F-18 FDG was injected intravenously. Full-ring PET imaging was performed from the skull base to thigh after the radiotracer. CT data was obtained and used for attenuation correction and anatomic localization. Fasting blood glucose: 93 mg/dl COMPARISON:  CT abdomen 04/08/2021 FINDINGS: Mediastinal blood pool activity: SUV max 2.5 Liver activity: SUV max NA NECK: No significant abnormal hypermetabolic activity in this region. Incidental CT findings: Bilateral common carotid atherosclerotic calcification. CHEST: 0.8 cm right paratracheal lymph node, maximum SUV 2.8 which is minimally above blood pool. Incidental CT findings: Prominent and dense mitral annular calcification. Coronary, aortic arch, and branch vessel atherosclerotic vascular disease. ABDOMEN/PELVIS: No significant abnormal hypermetabolic activity in this region. Please note, high activity in excreted FDG along the collecting system could certainly obscure small urothelial lesions. The appearance of right-sided mass in the urinary bladder on prior CT of 04/08/2021 is no longer readily apparent on the CT data although the bladder is relatively nondistended. Incidental CT findings: Diffuse hepatic steatosis. Nodular liver contour compatible cirrhosis. Multiple gallstones are present in the gallbladder including a 3.0 by 5.2 by 3.0 cm stone, a variety of smaller stones, and dense calcification along the fundus of the gallbladder which could be from gallstone or early porcelain gallbladder. Porcelain gallbladder can be associated increased risk of gallbladder malignancy. Atherosclerosis is present, including aortoiliac atherosclerotic disease. Suspected 2 mm right mid kidney nonobstructive renal calculus on image 127 series 3. Enlarged fibroid uterus without significant hypermetabolic activity. SKELETON: INSERT skip region Incidental CT findings: Severe degenerative glenohumeral arthropathy.  Substantial multilevel cervical and lumbar spondylosis and degenerative disc disease. IMPRESSION: 1. No specific findings of active malignancy. An upper normal sized right paratracheal lymph node has activity just minimally above blood pool and is highly likely to be benign given the lack of intervening adenopathy. Please note that high activity in excreted FDG could obscure small urothelial lesions. 2. Other imaging findings of potential clinical significance: Prominent and dense mitral annular  calcification. Aortic Atherosclerosis (ICD10-I70.0). Coronary atherosclerosis. Diffuse hepatic steatosis. Hepatic cirrhosis. 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 Uterine fibroids. Severe degenerative glenohumeral arthropathy. Substantial cervical and lumbar spondylosis. Electronically Signed   By: Van Clines M.D.   On: 06/18/2021 14:36    ASSESSMENT: Stage II bladder cancer.  PLAN:    Stage II bladder cancer: Pathology and imaging reviewed independently.  PET scan results from June 18, 2021 reviewed independently and report as above with no specific findings of active malignancy.  Since patient is not a surgical candidate she underwent TURBT and plan to give concurrent weekly cisplatin 35 mg/m2 along with daily XRT for 6 to 8 weeks.  Patient initiated XRT yesterday.  Patient did not have port placement.  Proceed with cycle 1 of cisplatin today.  Continue daily XRT.  Return to clinic in 1 week for further evaluation and consideration of cycle 2.   Hypomagnesia: Patient will receive 4 g IV magnesium today. Leukopenia: Mild, monitor.   Patient expressed understanding and was in agreement with this plan. She also understands that She can call clinic at any time with any questions, concerns, or complaints.   Cancer Staging Bladder cancer [facility]) Staging  form: Urinary Bladder, AJCC 8th Edition - Clinical stage from 06/04/2021: Stage II (cT2, cN0, cM0) - Signed by Lloyd Huger, MD on 06/04/2021 WHO/ISUP grade (low/high): High Grade Histologic grading system: 2 grade system  Lloyd Huger, MD   06/23/2021 12:31 PM

## 2021-06-22 ENCOUNTER — Ambulatory Visit
Admission: RE | Admit: 2021-06-22 | Discharge: 2021-06-22 | Disposition: A | Discharge status: Home / Self Care | Payer: Medicare Other | Source: Ambulatory Visit | Attending: Radiation Oncology | Admitting: Radiation Oncology

## 2021-06-22 DIAGNOSIS — Z510 Encounter for antineoplastic radiation therapy: Secondary | ICD-10-CM | POA: Diagnosis not present

## 2021-06-23 ENCOUNTER — Other Ambulatory Visit: Payer: Self-pay

## 2021-06-23 ENCOUNTER — Inpatient Hospital Stay (HOSPITAL_BASED_OUTPATIENT_CLINIC_OR_DEPARTMENT_OTHER): Payer: Medicare Other | Admitting: Oncology

## 2021-06-23 ENCOUNTER — Encounter: Payer: Self-pay | Admitting: Oncology

## 2021-06-23 ENCOUNTER — Ambulatory Visit
Admission: RE | Admit: 2021-06-23 | Discharge: 2021-06-23 | Disposition: A | Discharge status: Home / Self Care | Payer: Medicare Other | Source: Ambulatory Visit | Attending: Radiation Oncology | Admitting: Radiation Oncology

## 2021-06-23 ENCOUNTER — Inpatient Hospital Stay: Payer: Medicare Other

## 2021-06-23 VITALS — BP 128/99 | HR 68 | Temp 97.1°F | Resp 17 | Wt 131.0 lb

## 2021-06-23 DIAGNOSIS — C678 Malignant neoplasm of overlapping sites of bladder: Secondary | ICD-10-CM | POA: Diagnosis not present

## 2021-06-23 DIAGNOSIS — Z510 Encounter for antineoplastic radiation therapy: Secondary | ICD-10-CM | POA: Diagnosis not present

## 2021-06-23 LAB — CBC WITH DIFFERENTIAL/PLATELET
Abs Immature Granulocytes: 0 10*3/uL (ref 0.00–0.07)
Basophils Absolute: 0 10*3/uL (ref 0.0–0.1)
Basophils Relative: 1 %
Eosinophils Absolute: 0.1 10*3/uL (ref 0.0–0.5)
Eosinophils Relative: 2 %
HCT: 37.4 % (ref 36.0–46.0)
Hemoglobin: 12.9 g/dL (ref 12.0–15.0)
Immature Granulocytes: 0 %
Lymphocytes Relative: 42 %
Lymphs Abs: 1.2 10*3/uL (ref 0.7–4.0)
MCH: 33.6 pg (ref 26.0–34.0)
MCHC: 34.5 g/dL (ref 30.0–36.0)
MCV: 97.4 fL (ref 80.0–100.0)
Monocytes Absolute: 0.3 10*3/uL (ref 0.1–1.0)
Monocytes Relative: 9 %
Neutro Abs: 1.3 10*3/uL — ABNORMAL LOW (ref 1.7–7.7)
Neutrophils Relative %: 46 %
Platelets: 156 10*3/uL (ref 150–400)
RBC: 3.84 MIL/uL — ABNORMAL LOW (ref 3.87–5.11)
RDW: 13.7 % (ref 11.5–15.5)
WBC: 2.9 10*3/uL — ABNORMAL LOW (ref 4.0–10.5)
nRBC: 0 % (ref 0.0–0.2)

## 2021-06-23 LAB — BASIC METABOLIC PANEL
Anion gap: 11 (ref 5–15)
BUN: 9 mg/dL (ref 8–23)
CO2: 25 mmol/L (ref 22–32)
Calcium: 8.6 mg/dL — ABNORMAL LOW (ref 8.9–10.3)
Chloride: 99 mmol/L (ref 98–111)
Creatinine, Ser: 0.67 mg/dL (ref 0.44–1.00)
GFR, Estimated: >60 mL/min (ref >60)
Glucose, Bld: 107 mg/dL — ABNORMAL HIGH (ref 70–99)
Potassium: 4 mmol/L (ref 3.5–5.1)
Sodium: 135 mmol/L (ref 135–145)

## 2021-06-23 LAB — MAGNESIUM: Magnesium: 1.3 mg/dL — ABNORMAL LOW (ref 1.7–2.4)

## 2021-06-23 MED ORDER — SODIUM CHLORIDE 0.9 % IV SOLN
10.0000 mg | Freq: Once | INTRAVENOUS | Status: AC
  Administered 2021-06-23: 10 mg via INTRAVENOUS
  Filled 2021-06-23: qty 10

## 2021-06-23 MED ORDER — SODIUM CHLORIDE 0.9 % IV SOLN
150.0000 mg | Freq: Once | INTRAVENOUS | Status: AC
  Administered 2021-06-23: 150 mg via INTRAVENOUS
  Filled 2021-06-23: qty 5

## 2021-06-23 MED ORDER — SODIUM CHLORIDE 0.9 % IV SOLN
35.0000 mg/m2 | Freq: Once | INTRAVENOUS | Status: AC
  Administered 2021-06-23: 55 mg via INTRAVENOUS
  Filled 2021-06-23: qty 55

## 2021-06-23 MED ORDER — SODIUM CHLORIDE 0.9 % IV SOLN
Freq: Once | INTRAVENOUS | Status: AC
  Filled 2021-06-23: qty 250

## 2021-06-23 MED ORDER — MAGNESIUM SULFATE 2 GM/50ML IV SOLN: 2.0000 g | Freq: Once | INTRAVENOUS | Status: DC

## 2021-06-23 MED ORDER — MAGNESIUM SULFATE 4 GM/100ML IV SOLN
4.0000 g | Freq: Once | INTRAVENOUS | Status: AC
  Administered 2021-06-23: 4 g via INTRAVENOUS
  Filled 2021-06-23: qty 100

## 2021-06-23 MED ORDER — POTASSIUM CHLORIDE IN NACL 20-0.9 MEQ/L-% IV SOLN
Freq: Once | INTRAVENOUS | Status: AC
  Filled 2021-06-23: qty 1000

## 2021-06-23 MED ORDER — PALONOSETRON HCL INJECTION 0.25 MG/5ML
0.2500 mg | Freq: Once | INTRAVENOUS | Status: AC
  Administered 2021-06-23: 0.25 mg via INTRAVENOUS
  Filled 2021-06-23: qty 5

## 2021-06-23 NOTE — Patient Instructions (Signed)
[facility]  Discharge Instructions: Thank you for choosing [facility] to provide your oncology and hematology care.  If you have a lab appointment with the Cancer Center, please go directly to the Cancer Center and check in at the registration area.  Wear comfortable clothing and clothing appropriate for easy access to any Portacath or PICC line.   We strive to give you quality time with your provider. You may need to reschedule your appointment if you arrive late (15 or more minutes).  Arriving late affects you and other patients whose appointments are after yours.  Also, if you miss three or more appointments without notifying the office, you may be dismissed from the clinic at the provider's discretion.      For prescription refill requests, have your pharmacy contact our office and allow 72 hours for refills to be completed.    Today you received the following chemotherapy and/or immunotherapy agents - cisplatin      To help prevent nausea and vomiting after your treatment, we encourage you to take your nausea medication as directed.  BELOW ARE SYMPTOMS THAT SHOULD BE REPORTED IMMEDIATELY: *FEVER GREATER THAN 100.4 F (38 C) OR HIGHER *CHILLS OR SWEATING *NAUSEA AND VOMITING THAT IS NOT CONTROLLED WITH YOUR NAUSEA MEDICATION *UNUSUAL SHORTNESS OF BREATH *UNUSUAL BRUISING OR BLEEDING *URINARY PROBLEMS (pain or burning when urinating, or frequent urination) *BOWEL PROBLEMS (unusual diarrhea, constipation, pain near the anus) TENDERNESS IN MOUTH AND THROAT WITH OR WITHOUT PRESENCE OF ULCERS (sore throat, sores in mouth, or a toothache) UNUSUAL RASH, SWELLING OR PAIN  UNUSUAL VAGINAL DISCHARGE OR ITCHING   Items with * indicate a potential emergency and should be followed up as soon as possible or go to the Emergency Department if any problems should occur.  Please show the CHEMOTHERAPY ALERT CARD or IMMUNOTHERAPY ALERT CARD at check-in  to the Emergency Department and triage nurse.  Should you have questions after your visit or need to cancel or reschedule your appointment, please contact [facility]  [phone] and follow the prompts.  Office hours are 8:00 a.m. to 4:30 p.m. Monday - Friday. Please note that voicemails left after 4:00 p.m. may not be returned until the following business day.  We are closed weekends and major holidays. You have access to a nurse at all times for urgent questions. Please call the main number to the clinic [phone] and follow the prompts.  For any non-urgent questions, you may also contact your provider using MyChart. We now offer e-Visits for anyone 18 and older to request care online for non-urgent symptoms. For details visit [email or web address].   Also download the MyChart app! Go to the app store, search "MyChart", open the app, select [facility], and log in with your MyChart username and password.  Due to Covid, a mask is required upon entering the hospital/clinic. If you do not have a mask, one will be given to you upon arrival. For doctor visits, patients may have 1 support person aged 18 or older with them. For treatment visits, patients cannot have anyone with them due to current Covid guidelines and our immunocompromised population.   Cisplatin injection What is this medication? CISPLATIN (SIS pla tin) is a chemotherapy drug. It targets fast dividing cells, like cancer cells, and causes these cells to die. This medicine is used to treat many types of cancer like bladder, ovarian, and testicular cancers. This medicine may be used for other purposes; ask your   health care provider or pharmacist if you have questions. COMMON BRAND NAME(S): Platinol, Platinol -AQ What should I tell my care team before I take this medication? They need to know if you have any of these conditions: eye disease, vision problems hearing problems kidney disease low  blood counts, like white cells, platelets, or red blood cells tingling of the fingers or toes, or other nerve disorder an unusual or allergic reaction to cisplatin, carboplatin, oxaliplatin, other medicines, foods, dyes, or preservatives pregnant or trying to get pregnant breast-feeding How should I use this medication? This drug is given as an infusion into a vein. It is administered in a hospital or clinic by a specially trained health care professional. Talk to your pediatrician regarding the use of this medicine in children. Special care may be needed. Overdosage: If you think you have taken too much of this medicine contact a poison control center or emergency room at once. NOTE: This medicine is only for you. Do not share this medicine with others. What if I miss a dose? It is important not to miss a dose. Call your doctor or health care professional if you are unable to keep an appointment. What may interact with this medication? This medicine may interact with the following medications: foscarnet certain antibiotics like amikacin, gentamicin, neomycin, polymyxin B, streptomycin, tobramycin, vancomycin This list may not describe all possible interactions. Give your health care provider a list of all the medicines, herbs, non-prescription drugs, or dietary supplements you use. Also tell them if you smoke, drink alcohol, or use illegal drugs. Some items may interact with your medicine. What should I watch for while using this medication? Your condition will be monitored carefully while you are receiving this medicine. You will need important blood work done while you are taking this medicine. This drug may make you feel generally unwell. This is not uncommon, as chemotherapy can affect healthy cells as well as cancer cells. Report any side effects. Continue your course of treatment even though you feel ill unless your doctor tells you to stop. This medicine may increase your risk of getting  an infection. Call your healthcare professional for advice if you get a fever, chills, or sore throat, or other symptoms of a cold or flu. Do not treat yourself. Try to avoid being around people who are sick. Avoid taking medicines that contain aspirin, acetaminophen, ibuprofen, naproxen, or ketoprofen unless instructed by your healthcare professional. These medicines may hide a fever. This medicine may increase your risk to bruise or bleed. Call your doctor or health care professional if you notice any unusual bleeding. Be careful brushing and flossing your teeth or using a toothpick because you may get an infection or bleed more easily. If you have any dental work done, tell your dentist you are receiving this medicine. Do not become pregnant while taking this medicine or for 14 months after stopping it. Women should inform their healthcare professional if they wish to become pregnant or think they might be pregnant. Men should not father a child while taking this medicine and for 11 months after stopping it. There is potential for serious side effects to an unborn child. Talk to your healthcare professional for more information. Do not breast-feed an infant while taking this medicine. This medicine has caused ovarian failure in some women. This medicine may make it more difficult to get pregnant. Talk to your healthcare professional if you are concerned about your fertility. This medicine has caused decreased sperm counts in some   men. This may make it more difficult to father a child. Talk to your healthcare professional if you are concerned about your fertility. Drink fluids as directed while you are taking this medicine. This will help protect your kidneys. Call your doctor or health care professional if you get diarrhea. Do not treat yourself. What side effects may I notice from receiving this medication? Side effects that you should report to your doctor or health care professional as soon as  possible: allergic reactions like skin rash, itching or hives, swelling of the face, lips, or tongue blurred vision changes in vision decreased hearing or ringing of the ears nausea, vomiting pain, redness, or irritation at site where injected pain, tingling, numbness in the hands or feet signs and symptoms of bleeding such as bloody or black, tarry stools; red or dark brown urine; spitting up blood or brown material that looks like coffee grounds; red spots on the skin; unusual bruising or bleeding from the eyes, gums, or nose signs and symptoms of infection like fever; chills; cough; sore throat; pain or trouble passing urine signs and symptoms of kidney injury like trouble passing urine or change in the amount of urine signs and symptoms of low red blood cells or anemia such as unusually weak or tired; feeling faint or lightheaded; falls; breathing problems Side effects that usually do not require medical attention (report to your doctor or health care professional if they continue or are bothersome): loss of appetite mouth sores muscle cramps This list may not describe all possible side effects. Call your doctor for medical advice about side effects. You may report side effects to FDA at 1-800-FDA-1088. Where should I keep my medication? This drug is given in a hospital or clinic and will not be stored at home. NOTE: This sheet is a summary. It may not cover all possible information. If you have questions about this medicine, talk to your doctor, pharmacist, or health care provider.  2022 Elsevier/Gold Standard (2021-04-14 00:00:00)  

## 2021-06-23 NOTE — Progress Notes (Signed)
Patient here for oncology follow-up appointment,  concerns of arm numbness and constipation

## 2021-06-23 NOTE — Progress Notes (Signed)
Mag 4 grams total for Mag 1.3

## 2021-06-24 ENCOUNTER — Telehealth: Payer: Self-pay

## 2021-06-24 ENCOUNTER — Ambulatory Visit
Admission: RE | Admit: 2021-06-24 | Discharge: 2021-06-24 | Disposition: A | Discharge status: Home / Self Care | Payer: Medicare Other | Source: Ambulatory Visit | Attending: Radiation Oncology | Admitting: Radiation Oncology

## 2021-06-24 DIAGNOSIS — Z510 Encounter for antineoplastic radiation therapy: Secondary | ICD-10-CM | POA: Diagnosis not present

## 2021-06-24 NOTE — Telephone Encounter (Signed)
T/C to patient for follow up after 1st chemo.   No answer and has not set up voice mail so unable to leave message.

## 2021-06-25 ENCOUNTER — Ambulatory Visit
Admission: RE | Admit: 2021-06-25 | Discharge: 2021-06-25 | Disposition: A | Discharge status: Home / Self Care | Payer: Medicare Other | Source: Ambulatory Visit | Attending: Radiation Oncology | Admitting: Radiation Oncology

## 2021-06-25 DIAGNOSIS — Z510 Encounter for antineoplastic radiation therapy: Secondary | ICD-10-CM | POA: Diagnosis not present

## 2021-06-26 ENCOUNTER — Ambulatory Visit
Admission: RE | Admit: 2021-06-26 | Discharge: 2021-06-26 | Disposition: A | Discharge status: Home / Self Care | Payer: Medicare Other | Source: Ambulatory Visit | Attending: Radiation Oncology | Admitting: Radiation Oncology

## 2021-06-26 DIAGNOSIS — Z510 Encounter for antineoplastic radiation therapy: Secondary | ICD-10-CM | POA: Diagnosis not present

## 2021-06-29 ENCOUNTER — Ambulatory Visit
Admission: RE | Admit: 2021-06-29 | Discharge: 2021-06-29 | Disposition: A | Discharge status: Home / Self Care | Payer: Medicare Other | Source: Ambulatory Visit | Attending: Radiation Oncology | Admitting: Radiation Oncology

## 2021-06-29 DIAGNOSIS — Z510 Encounter for antineoplastic radiation therapy: Secondary | ICD-10-CM | POA: Diagnosis not present

## 2021-06-29 NOTE — Progress Notes (Signed)
Portal  [phone]) [phone] [phone]) [phone]  ID: Kristina Ellis OB: [birth year]  MR#: [id number]  ZHG#:[id number]  Patient Care Team: [facility] as PCP - General (General Practice) Lloyd Huger, MD as Consulting Physician (Hematology and Oncology)  CHIEF COMPLAINT: Stage II bladder cancer.  INTERVAL HISTORY: Patient returns to clinic today for further evaluation and consideration of cycle 2 of weekly cisplatin.  She continues with daily XRT.  She is tolerating her treatments well without significant side effects.  She has no further hematuria.  She has chronic weakness and fatigue. She has no neurologic complaints.  She denies any recent fevers or illnesses.  She has a good appetite and denies weight loss.  She has no chest pain, shortness of breath, cough, or hemoptysis.  She denies any nausea, vomiting, constipation, or diarrhea.  She has no other urinary complaints.  Patient offers no further specific complaints today.  REVIEW OF SYSTEMS:   Review of Systems  Constitutional:  Positive for malaise/fatigue. Negative for fever and weight loss.  Respiratory: Negative.  Negative for cough, hemoptysis and shortness of breath.   Cardiovascular: Negative.  Negative for chest pain and leg swelling.  Gastrointestinal: Negative.  Negative for abdominal pain.  Genitourinary:  Negative for hematuria.  Musculoskeletal: Negative.  Negative for back pain.  Skin: Negative.  Negative for rash.  Neurological:  Positive for weakness. Negative for dizziness, seizures and headaches.  Psychiatric/Behavioral: Negative.  The patient is not nervous/anxious.    As per HPI. Otherwise, a complete review of systems is negative.  PAST MEDICAL HISTORY: Past Medical History:  Diagnosis Date   Aortic atherosclerosis ([place])    Arthritis    Atrial fibrillation ([place])    a.) s/p TEE with cardioversion on 02/04/2020. b.) on daily apixaban.   Bladder mass     a.) CT 04/08/2021 --> 3.2 cm intraluminal bladder mass.   Carotid stenosis, bilateral    a.) Doppler 07/04/2020 --> mild; 1-49% stenosis BILATERALLY.   Cholelithiasis    a.) CT 11/06/2020 --> largest measured 4.5 cm   Chronic anticoagulation    a.) Apixaban   Common biliary duct calculus    a.) CT 04/08/2021 --> CBD dilated at 8 mm; 66mm calculus within distal CBD.   Diastolic dysfunction    a.) TTE 07/04/2020 --> LVEF 60-65%; G1DD.   Hepatic cirrhosis ([facility])    Hepatic steatosis    Hepatitis 1970   History of 2019 novel coronavirus disease (COVID-19) 12/20/2019   History of marijuana use    Hypertension    Hypertensive retinopathy    IDA (iron deficiency anemia)    Mitral stenosis    a.) TEE 04/10/2020 --> mild. b.) TTE 07/04/2020 --> EF 60-65%; mild (mean gradient 5 mmHg).   Nephrolithiasis    NSTEMI (non-ST elevated myocardial infarction) ([place]) 07/26/2016   PAH (pulmonary artery hypertension) ([place])    a.) TTE 12/21/2019 --> PASP 44 mmHg.   Uterine fibroid    a.) CT 04/08/2021 --> multiple with largest measuring 7 cm.   Valvular regurgitation    a.) TTE 09/01/2010 --> trivial to mild pan-valvular. b.) TTE 12/21/2019 --> mild MR and AR; moderate TR. c.) TTE 07/04/2020 --> mild TR; trivial MR and PR.   Vestibular neuronitis     PAST SURGICAL HISTORY: Past Surgical History:  Procedure Laterality Date   BREAST CYST ASPIRATION Left    COLONOSCOPY  2012   TEE WITH CARDIOVERSION N/A 02/04/2020   Procedure: TEE WITH CARDIOVERSION; Location: [facility];  Surgeon: Kandis Cocking, MD   TRANSURETHRAL RESECTION OF BLADDER TUMOR N/A 05/15/2021   Procedure: TRANSURETHRAL RESECTION OF BLADDER TUMOR (TURBT);  Surgeon: Billey Co, MD;  Location: [facility];  Service: Urology;  Laterality: N/A;    FAMILY HISTORY: Family History  Problem Relation Age of Onset   Aneurysm Mother    Colon cancer Father    Lung cancer Brother    Breast cancer Neg Hx     ADVANCED DIRECTIVES (Y/N):  N  HEALTH  MAINTENANCE: Social History   Tobacco Use   Smoking status: Former    Packs/day: 0.10    Years: 10.00    Pack years: 1.00    Types: Cigarettes   Smokeless tobacco: Never   Tobacco comments:    occasional smoker  Vaping Use   Vaping Use: Never used  Substance Use Topics   Alcohol use: Yes    Alcohol/week: 5.0 standard drinks    Types: 5 Cans of beer per week    Comment: weekly   Drug use: Not Currently    Types: 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     ELIQUIS 5 MG TABS tablet Take 5 mg by mouth 2 (two) times daily.     FEROSUL 325 (65 Fe) MG tablet Take 325 mg by mouth daily.     magnesium oxide (MAG-OX) 400 MG tablet Take 1 tablet by mouth 2 (two) times daily.     metoprolol succinate (TOPROL-XL) 100 MG 24 hr tablet Take 1 tablet (100 mg total) by mouth daily. Take with or immediately following a meal. 30 tablet 3   ondansetron (ZOFRAN) 8 MG tablet Take 1 tablet (8 mg total) by mouth 2 (two) times daily as needed. Start on the third day after cisplatin chemotherapy. 60 tablet 1   prochlorperazine (COMPAZINE) 10 MG tablet Take 1 tablet (10 mg total) by mouth every 6 (six) hours as needed (Nausea or vomiting). 60 tablet 1   trolamine salicylate (ASPERCREME) 10 % cream Apply 1 application topically as needed for muscle pain.     No current facility-administered medications for this visit.   Facility-Administered Medications Ordered in Other Visits  Medication Dose Route Frequency Provider Last Rate Last Admin   CISplatin (PLATINOL) 55 mg in sodium chloride 0.9 % 250 mL chemo infusion  35 mg/m2 (Treatment Plan Recorded) Intravenous Once Lloyd Huger, MD        dexamethasone (DECADRON) 10 mg in sodium chloride 0.9 % 50 mL IVPB  10 mg Intravenous Once Lloyd Huger, MD 204 mL/hr at 06/30/21 1209 10 mg at 06/30/21 1209   fosaprepitant (EMEND) 150 mg in sodium chloride 0.9 % 145 mL IVPB  150 mg Intravenous Once Lloyd Huger, MD        OBJECTIVE: Vitals:   06/30/21 0847  BP: (!) 124/91  Pulse: 93  Temp: (!) 97.1 F (36.2 C)     Body mass index is 25.85 kg/m.    ECOG FS:1 - Symptomatic but completely ambulatory  General: Well-developed, well-nourished, no acute distress.  Sitting in a wheelchair. Eyes: Pink conjunctiva, anicteric sclera. HEENT: Normocephalic, moist mucous membranes. Lungs: No audible wheezing  or coughing. Heart: Regular rate and rhythm. Abdomen: Soft, nontender, no obvious distention. Musculoskeletal: No edema, cyanosis, or clubbing. Neuro: Alert, answering all questions appropriately. Cranial nerves grossly intact. Skin: No rashes or petechiae noted. Psych: Normal affect.  LAB RESULTS:  Lab Results  Component Value Date   NA 136 06/30/2021   K 4.0 06/30/2021   CL 103 06/30/2021   CO2 23 06/30/2021   GLUCOSE 96 06/30/2021   BUN 13 06/30/2021   CREATININE 0.77 06/30/2021   CALCIUM 8.8 (L) 06/30/2021   PROT 7.7 03/07/2018   ALBUMIN 2.8 (L) 03/07/2018   AST 68 (H) 03/07/2018   ALT 34 03/07/2018   ALKPHOS 73 03/07/2018   BILITOT 1.2 03/07/2018   GFRNONAA >60 06/30/2021   GFRAA >60 03/07/2018    Lab Results  Component Value Date   WBC 3.3 (L) 06/30/2021   NEUTROABS 1.3 (L) 06/30/2021   HGB 12.4 06/30/2021   HCT 36.0 06/30/2021   MCV 97.0 06/30/2021   PLT 223 06/30/2021     STUDIES: NM PET Image Initial (PI) Skull Base To Thigh  Result Date: 06/18/2021 CLINICAL DATA:  Subsequent treatment strategy for bladder cancer. EXAM: NUCLEAR MEDICINE PET SKULL BASE TO THIGH TECHNIQUE: 7.7 mCi F-18 FDG was injected intravenously. Full-ring PET imaging was performed from the skull base to thigh after the  radiotracer. CT data was obtained and used for attenuation correction and anatomic localization. Fasting blood glucose: 93 mg/dl COMPARISON:  CT abdomen 04/08/2021 FINDINGS: Mediastinal blood pool activity: SUV max 2.5 Liver activity: SUV max NA NECK: No significant abnormal hypermetabolic activity in this region. Incidental CT findings: Bilateral common carotid atherosclerotic calcification. CHEST: 0.8 cm right paratracheal lymph node, maximum SUV 2.8 which is minimally above blood pool. Incidental CT findings: Prominent and dense mitral annular calcification. Coronary, aortic arch, and branch vessel atherosclerotic vascular disease. ABDOMEN/PELVIS: No significant abnormal hypermetabolic activity in this region. Please note, high activity in excreted FDG along the collecting system could certainly obscure small urothelial lesions. The appearance of right-sided mass in the urinary bladder on prior CT of 04/08/2021 is no longer readily apparent on the CT data although the bladder is relatively nondistended. Incidental CT findings: Diffuse hepatic steatosis. Nodular liver contour compatible cirrhosis. Multiple gallstones are present in the gallbladder including a 3.0 by 5.2 by 3.0 cm stone, a variety of smaller stones, and dense calcification along the fundus of the gallbladder which could be from gallstone or early porcelain gallbladder. Porcelain gallbladder can be associated increased risk of gallbladder malignancy. Atherosclerosis is present, including aortoiliac atherosclerotic disease. Suspected 2 mm right mid kidney nonobstructive renal calculus on image 127 series 3. Enlarged fibroid uterus without significant hypermetabolic activity. SKELETON: INSERT skip region Incidental CT findings: Severe degenerative glenohumeral arthropathy. Substantial multilevel cervical and lumbar spondylosis and degenerative disc disease. IMPRESSION: 1. No specific findings of active malignancy. An upper normal sized right  paratracheal lymph node has activity just minimally above blood pool and is highly likely to be benign given the lack of intervening adenopathy. Please note that high activity in excreted FDG could obscure small urothelial lesions. 2. Other imaging findings of potential clinical significance: Prominent and dense mitral annular calcification. Aortic Atherosclerosis (ICD10-I70.0). Coronary atherosclerosis. Diffuse hepatic steatosis. Hepatic cirrhosis. 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 Uterine fibroids. Severe degenerative glenohumeral arthropathy. Substantial cervical and lumbar spondylosis. Electronically Signed   By: Van Clines M.D.   On:  06/18/2021 14:36    ASSESSMENT: Stage II bladder cancer.  PLAN:    Stage II bladder cancer: Pathology and imaging reviewed independently.  PET scan results from June 18, 2021 reviewed independently and reported as above with no specific findings of active malignancy.  Since patient is not a surgical candidate she underwent TURBT.  Plan to give concurrent weekly cisplatin 35 mg/m2 along with daily XRT for 6 to 8 weeks.  Patient did not have port placement.  Continue daily XRT.  Proceed with cycle 2 of weekly cisplatin today.  Return to clinic in 1 week for further evaluation and consideration of cycle 3.     Hypomagnesia: Mildly improved.  Patient will receive 4 g IV magnesium today. Leukopenia: Chronic and unchanged, monitor.   Patient expressed understanding and was in agreement with this plan. She also understands that She can call clinic at any time with any questions, concerns, or complaints.    Cancer Staging  Bladder cancer [facility]) Staging form: Urinary Bladder, AJCC 8th Edition - Clinical stage from 06/04/2021: Stage II (cT2, cN0, cM0) - Signed by Lloyd Huger, MD on 06/04/2021 WHO/ISUP  grade (low/high): High Grade Histologic grading system: 2 grade system  Lloyd Huger, MD   06/30/2021 12:15 PM

## 2021-06-30 ENCOUNTER — Encounter: Payer: Self-pay | Admitting: Oncology

## 2021-06-30 ENCOUNTER — Inpatient Hospital Stay (HOSPITAL_BASED_OUTPATIENT_CLINIC_OR_DEPARTMENT_OTHER): Payer: Medicare Other | Admitting: Oncology

## 2021-06-30 ENCOUNTER — Inpatient Hospital Stay: Payer: Medicare Other

## 2021-06-30 ENCOUNTER — Other Ambulatory Visit: Payer: Self-pay

## 2021-06-30 ENCOUNTER — Ambulatory Visit
Admission: RE | Admit: 2021-06-30 | Discharge: 2021-06-30 | Disposition: A | Discharge status: Home / Self Care | Payer: Medicare Other | Source: Ambulatory Visit | Attending: Radiation Oncology | Admitting: Radiation Oncology

## 2021-06-30 VITALS — BP 134/88 | HR 81

## 2021-06-30 VITALS — BP 124/91 | HR 93 | Temp 97.1°F | Wt 128.0 lb

## 2021-06-30 DIAGNOSIS — C678 Malignant neoplasm of overlapping sites of bladder: Secondary | ICD-10-CM | POA: Diagnosis not present

## 2021-06-30 DIAGNOSIS — Z510 Encounter for antineoplastic radiation therapy: Secondary | ICD-10-CM | POA: Diagnosis not present

## 2021-06-30 LAB — CBC WITH DIFFERENTIAL/PLATELET
Abs Immature Granulocytes: 0.01 10*3/uL (ref 0.00–0.07)
Basophils Absolute: 0 10*3/uL (ref 0.0–0.1)
Basophils Relative: 1 %
Eosinophils Absolute: 0.1 10*3/uL (ref 0.0–0.5)
Eosinophils Relative: 3 %
HCT: 36 % (ref 36.0–46.0)
Hemoglobin: 12.4 g/dL (ref 12.0–15.0)
Immature Granulocytes: 0 %
Lymphocytes Relative: 48 %
Lymphs Abs: 1.5 10*3/uL (ref 0.7–4.0)
MCH: 33.4 pg (ref 26.0–34.0)
MCHC: 34.4 g/dL (ref 30.0–36.0)
MCV: 97 fL (ref 80.0–100.0)
Monocytes Absolute: 0.3 10*3/uL (ref 0.1–1.0)
Monocytes Relative: 10 %
Neutro Abs: 1.3 10*3/uL — ABNORMAL LOW (ref 1.7–7.7)
Neutrophils Relative %: 38 %
Platelets: 223 10*3/uL (ref 150–400)
RBC: 3.71 MIL/uL — ABNORMAL LOW (ref 3.87–5.11)
RDW: 13.2 % (ref 11.5–15.5)
WBC: 3.3 10*3/uL — ABNORMAL LOW (ref 4.0–10.5)
nRBC: 0 % (ref 0.0–0.2)

## 2021-06-30 LAB — BASIC METABOLIC PANEL
Anion gap: 10 (ref 5–15)
BUN: 13 mg/dL (ref 8–23)
CO2: 23 mmol/L (ref 22–32)
Calcium: 8.8 mg/dL — ABNORMAL LOW (ref 8.9–10.3)
Chloride: 103 mmol/L (ref 98–111)
Creatinine, Ser: 0.77 mg/dL (ref 0.44–1.00)
GFR, Estimated: >60 mL/min (ref >60)
Glucose, Bld: 96 mg/dL (ref 70–99)
Potassium: 4 mmol/L (ref 3.5–5.1)
Sodium: 136 mmol/L (ref 135–145)

## 2021-06-30 LAB — MAGNESIUM: Magnesium: 1.5 mg/dL — ABNORMAL LOW (ref 1.7–2.4)

## 2021-06-30 MED ORDER — MAGNESIUM SULFATE 2 GM/50ML IV SOLN
2.0000 g | Freq: Once | INTRAVENOUS | Status: AC
  Administered 2021-06-30: 2 g via INTRAVENOUS
  Filled 2021-06-30: qty 50

## 2021-06-30 MED ORDER — POTASSIUM CHLORIDE IN NACL 20-0.9 MEQ/L-% IV SOLN
Freq: Once | INTRAVENOUS | Status: AC
  Filled 2021-06-30: qty 1000

## 2021-06-30 MED ORDER — SODIUM CHLORIDE 0.9 % IV SOLN
150.0000 mg | Freq: Once | INTRAVENOUS | Status: AC
  Administered 2021-06-30: 150 mg via INTRAVENOUS
  Filled 2021-06-30: qty 150

## 2021-06-30 MED ORDER — PALONOSETRON HCL INJECTION 0.25 MG/5ML
0.2500 mg | Freq: Once | INTRAVENOUS | Status: AC
  Administered 2021-06-30: 0.25 mg via INTRAVENOUS
  Filled 2021-06-30: qty 5

## 2021-06-30 MED ORDER — SODIUM CHLORIDE 0.9 % IV SOLN
Freq: Once | INTRAVENOUS | Status: AC
  Filled 2021-06-30: qty 250

## 2021-06-30 MED ORDER — SODIUM CHLORIDE 0.9 % IV SOLN
35.0000 mg/m2 | Freq: Once | INTRAVENOUS | Status: AC
  Administered 2021-06-30: 55 mg via INTRAVENOUS
  Filled 2021-06-30: qty 55

## 2021-06-30 MED ORDER — SODIUM CHLORIDE 0.9 % IV SOLN
10.0000 mg | Freq: Once | INTRAVENOUS | Status: AC
  Administered 2021-06-30: 10 mg via INTRAVENOUS
  Filled 2021-06-30: qty 10

## 2021-06-30 NOTE — Patient Instructions (Signed)
[facility]  Discharge Instructions: Thank you for choosing [facility] to provide your oncology and hematology care.  If you have a lab appointment with the Cancer Center, please go directly to the Cancer Center and check in at the registration area.  Wear comfortable clothing and clothing appropriate for easy access to any Portacath or PICC line.   We strive to give you quality time with your provider. You may need to reschedule your appointment if you arrive late (15 or more minutes).  Arriving late affects you and other patients whose appointments are after yours.  Also, if you miss three or more appointments without notifying the office, you may be dismissed from the clinic at the provider's discretion.      For prescription refill requests, have your pharmacy contact our office and allow 72 hours for refills to be completed.      To help prevent nausea and vomiting after your treatment, we encourage you to take your nausea medication as directed.  BELOW ARE SYMPTOMS THAT SHOULD BE REPORTED IMMEDIATELY: *FEVER GREATER THAN 100.4 F (38 C) OR HIGHER *CHILLS OR SWEATING *NAUSEA AND VOMITING THAT IS NOT CONTROLLED WITH YOUR NAUSEA MEDICATION *UNUSUAL SHORTNESS OF BREATH *UNUSUAL BRUISING OR BLEEDING *URINARY PROBLEMS (pain or burning when urinating, or frequent urination) *BOWEL PROBLEMS (unusual diarrhea, constipation, pain near the anus) TENDERNESS IN MOUTH AND THROAT WITH OR WITHOUT PRESENCE OF ULCERS (sore throat, sores in mouth, or a toothache) UNUSUAL RASH, SWELLING OR PAIN  UNUSUAL VAGINAL DISCHARGE OR ITCHING   Items with * indicate a potential emergency and should be followed up as soon as possible or go to the Emergency Department if any problems should occur.  Please show the CHEMOTHERAPY ALERT CARD or IMMUNOTHERAPY ALERT CARD at check-in to the Emergency Department and triage nurse.  Should you have questions after your  visit or need to cancel or reschedule your appointment, please contact [facility]  [phone] and follow the prompts.  Office hours are 8:00 a.m. to 4:30 p.m. Monday - Friday. Please note that voicemails left after 4:00 p.m. may not be returned until the following business day.  We are closed weekends and major holidays. You have access to a nurse at all times for urgent questions. Please call the main number to the clinic [phone] and follow the prompts.  For any non-urgent questions, you may also contact your provider using MyChart. We now offer e-Visits for anyone 18 and older to request care online for non-urgent symptoms. For details visit [email or web address].   Also download the MyChart app! Go to the app store, search "MyChart", open the app, select [facility], and log in with your MyChart username and password.  Due to Covid, a mask is required upon entering the hospital/clinic. If you do not have a mask, one will be given to you upon arrival. For doctor visits, patients may have 1 support person aged 18 or older with them. For treatment visits, patients cannot have anyone with them due to current Covid guidelines and our immunocompromised population.  

## 2021-06-30 NOTE — Progress Notes (Signed)
Per Dr. Grayland Ormond, tx today + additional 2g Mag for a total of 4g.  At 12:05 after 2 hours of pre-hydration and IV magnesium, patient only had urine output of 100cc. MD notified. Per Dr. Grayland Ormond, go ahead with pre meds and cisplatin treatment at this time.

## 2021-07-01 ENCOUNTER — Encounter: Payer: Self-pay | Admitting: Urology

## 2021-07-01 ENCOUNTER — Ambulatory Visit (INDEPENDENT_AMBULATORY_CARE_PROVIDER_SITE_OTHER): Payer: Medicare Other | Admitting: Urology

## 2021-07-01 ENCOUNTER — Ambulatory Visit
Admission: RE | Admit: 2021-07-01 | Discharge: 2021-07-01 | Disposition: A | Discharge status: Home / Self Care | Payer: Medicare Other | Source: Ambulatory Visit | Attending: Radiation Oncology | Admitting: Radiation Oncology

## 2021-07-01 VITALS — BP 127/84 | HR 114 | Ht 59.0 in | Wt 128.0 lb

## 2021-07-01 DIAGNOSIS — C679 Malignant neoplasm of bladder, unspecified: Secondary | ICD-10-CM

## 2021-07-01 DIAGNOSIS — Z510 Encounter for antineoplastic radiation therapy: Secondary | ICD-10-CM | POA: Diagnosis not present

## 2021-07-01 NOTE — Patient Instructions (Signed)
Quitting smoking is the most important thing you can do to prevent your bladder cancer from recurring.  We will plan to see you in February for your for cystoscopy to look in the bladder and make sure there is no recurrence after chemotherapy and radiation.  It is normal to have some urgency, frequency, and some burning with urination after radiation, but if you have worsening symptoms please call the clinic.  Steps to Quit Smoking Smoking tobacco is the leading cause of preventable death. It can affect almost every organ in the body. Smoking puts you and those around you at risk for developing many serious chronic diseases. Quitting smoking can be difficult, but it is one of the best things that you can do for your health. It is never too late to quit. How do I get ready to quit? When you decide to quit smoking, create a plan to help you succeed. Before you quit: Pick a date to quit. Set a date within the next 2 weeks to give you time to prepare. Write down the reasons why you are quitting. Keep this list in places where you will see it often. Tell your family, friends, and co-workers that you are quitting. Support from your loved ones can make quitting easier. Talk with your health care provider about your options for quitting smoking. Find out what treatment options are covered by your health insurance. Identify people, places, things, and activities that make you want to smoke (triggers). Avoid them. What first steps can I take to quit smoking? Throw away all cigarettes at home, at work, and in your car. Throw away smoking accessories, such as Scientist, research (medical). Clean your car. Make sure to empty the ashtray. Clean your home, including curtains and carpets. What strategies can I use to quit smoking? Talk with your health care provider about combining strategies, such as taking medicines while you are also receiving in-person counseling. Using these two strategies together makes you more  likely to succeed in quitting than if you used either strategy on its own. If you are pregnant or breastfeeding, talk with your health care provider about finding counseling or other support strategies to quit smoking. Do not take medicine to help you quit smoking unless your health care provider tells you to do so. To quit smoking: Quit right away Quit smoking completely, instead of gradually reducing how much you smoke over a period of time. Research shows that stopping smoking right away is more successful than gradually quitting. Attend in-person counseling to help you build problem-solving skills. You are more likely to succeed in quitting if you attend counseling sessions regularly. Even short sessions of 10 minutes can be effective. Take medicine You may take medicines to help you quit smoking. Some medicines require a prescription and some you can purchase over-the-counter. Medicines may have nicotine in them to replace the nicotine in cigarettes. Medicines may: Help to stop cravings. Help to relieve withdrawal symptoms. Your health care provider may recommend: Nicotine patches, gum, or lozenges. Nicotine inhalers or sprays. Non-nicotine medicine that is taken by mouth. Find resources Find resources and support systems that can help you to quit smoking and remain smoke-free after you quit. These resources are most helpful when you use them often. They include: Online chats with a Social worker. Telephone quitlines. Printed Furniture conservator/restorer. Support groups or group counseling. Text messaging programs. Mobile phone apps or applications. Use apps that can help you stick to your quit plan by providing reminders, tips, and encouragement. There  are many free apps for mobile devices as well as websites. Examples include Quit Guide from the [employer] and smokefree.gov What things can I do to make it easier to quit?  Reach out to your family and friends for support and encouragement. Call telephone  quitlines (1-800-QUIT-NOW), reach out to support groups, or work with a counselor for support. Ask people who smoke to avoid smoking around you. Avoid places that trigger you to smoke, such as bars, parties, or smoke-break areas at work. Spend time with people who do not smoke. Lessen the stress in your life. Stress can be a smoking trigger for some people. To lessen stress, try: Exercising regularly. Doing deep-breathing exercises. Doing yoga. Meditating. Performing a body scan. This involves closing your eyes, scanning your body from head to toe, and noticing which parts of your body are particularly tense. Try to relax the muscles in those areas. How will I feel when I quit smoking? Day 1 to 3 weeks Within the first 24 hours of quitting smoking, you may start to feel withdrawal symptoms. These symptoms are usually most noticeable 2-3 days after quitting, but they usually do not last for more than 2-3 weeks. You may experience these symptoms: Mood swings. Restlessness, anxiety, or irritability. Trouble concentrating. Dizziness. Strong cravings for sugary foods and nicotine. Mild weight gain. Constipation. Nausea. Coughing or a sore throat. Changes in how the medicines that you take for unrelated issues work in your body. Depression. Trouble sleeping (insomnia). Week 3 and afterward After the first 2-3 weeks of quitting, you may start to notice more positive results, such as: Improved sense of smell and taste. Decreased coughing and sore throat. Slower heart rate. Lower blood pressure. Clearer skin. The ability to breathe more easily. Fewer sick days. Quitting smoking can be very challenging. Do not get discouraged if you are not successful the first time. Some people need to make many attempts to quit before they achieve long-term success. Do your best to stick to your quit plan, and talk with your health care provider if you have any questions or concerns. Summary Smoking  tobacco is the leading cause of preventable death. Quitting smoking is one of the best things that you can do for your health. When you decide to quit smoking, create a plan to help you succeed. Quit smoking right away, not slowly over a period of time. When you start quitting, seek help from your health care provider, family, or friends. This information is not intended to replace advice given to you by your health care provider. Make sure you discuss any questions you have with your health care provider. Document Revised: 04/03/2021 Document Reviewed: 10/14/2018 Elsevier Patient Education  [place].

## 2021-07-01 NOTE — Progress Notes (Signed)
   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  She refused referral for cystectomy, and opted for trimodal therapy with radiation, chemotherapy, and close surveillance.  I reviewed the outside notes from Dr. Baruch Gouty and radiation oncology as well as with Dr. Grayland Ormond in oncology, and reviewed the recent PET scan that showed no evidence of metastatic disease.  She is currently midway through radiation, and has 1 month left of cisplatin based chemotherapy.  She is tolerating this well with really no significant urinary symptoms or pelvic pain.  We reviewed the need for close surveillance cystoscopy moving forward, risk of recurrence, and I again counseled her on smoking cessation.  RTC February 2023 for first surveillance cystoscopy after trimodal therapy for muscle invasive bladder cancer  Billey Co, MD  [facility] [address] [place], [place] [address] [phone]

## 2021-07-06 ENCOUNTER — Ambulatory Visit
Admission: RE | Admit: 2021-07-06 | Discharge: 2021-07-06 | Disposition: A | Discharge status: Home / Self Care | Payer: Medicare Other | Source: Ambulatory Visit | Attending: Radiation Oncology | Admitting: Radiation Oncology

## 2021-07-06 DIAGNOSIS — Z510 Encounter for antineoplastic radiation therapy: Secondary | ICD-10-CM | POA: Diagnosis not present

## 2021-07-06 NOTE — Progress Notes (Signed)
Kristina Ellis  [phone] [phone]  ID: Kristina Ellis OB: [birth year]  MR#: [id number]  SRP#:[id number]  Patient Care Team: [facility] as PCP - General (General Practice) Lloyd Huger, MD as Consulting Physician (Hematology and Oncology)  CHIEF COMPLAINT: Stage II bladder cancer.  INTERVAL HISTORY: Patient returns to clinic today for further evaluation and consideration of cycle 3 of weekly cisplatin.  She continues to tolerate both her chemotherapy and daily XRT well without significant side effects.  She has no further hematuria.  She has chronic weakness and fatigue. She has no neurologic complaints.  She denies any recent fevers or illnesses.  She has a good appetite and denies weight loss.  She has no chest pain, shortness of breath, cough, or hemoptysis.  She denies any nausea, vomiting, constipation, or diarrhea.  She has no other urinary complaints.  Patient offers no further specific complaints today.  REVIEW OF SYSTEMS:   Review of Systems  Constitutional:  Positive for malaise/fatigue. Negative for fever and weight loss.  Respiratory: Negative.  Negative for cough, hemoptysis and shortness of breath.   Cardiovascular: Negative.  Negative for chest pain and leg swelling.  Gastrointestinal: Negative.  Negative for abdominal pain.  Genitourinary:  Negative for hematuria.  Musculoskeletal: Negative.  Negative for back pain.  Skin: Negative.  Negative for rash.  Neurological:  Positive for weakness. Negative for dizziness, seizures and headaches.  Psychiatric/Behavioral: Negative.  The patient is not nervous/anxious.    As per HPI. Otherwise, a complete review of systems is negative.  PAST MEDICAL HISTORY: Past Medical History:  Diagnosis Date   Aortic atherosclerosis ([place])    Arthritis    Atrial fibrillation ([place])    a.) s/p TEE with cardioversion on 02/04/2020. b.) on daily apixaban.   Bladder mass     a.) CT 04/08/2021 --> 3.2 cm intraluminal bladder mass.   Carotid stenosis, bilateral    a.) Doppler 07/04/2020 --> mild; 1-49% stenosis BILATERALLY.   Cholelithiasis    a.) CT 11/06/2020 --> largest measured 4.5 cm   Chronic anticoagulation    a.) Apixaban   Common biliary duct calculus    a.) CT 04/08/2021 --> CBD dilated at 8 mm; 31mm calculus within distal CBD.   Diastolic dysfunction    a.) TTE 07/04/2020 --> LVEF 60-65%; G1DD.   Hepatic cirrhosis ([facility])    Hepatic steatosis    Hepatitis 1970   History of 2019 novel coronavirus disease (COVID-19) 12/20/2019   History of marijuana use    Hypertension    Hypertensive retinopathy    IDA (iron deficiency anemia)    Mitral stenosis    a.) TEE 04/10/2020 --> mild. b.) TTE 07/04/2020 --> EF 60-65%; mild (mean gradient 5 mmHg).   Nephrolithiasis    NSTEMI (non-ST elevated myocardial infarction) ([place]) 07/26/2016   PAH (pulmonary artery hypertension) ([place])    a.) TTE 12/21/2019 --> PASP 44 mmHg.   Uterine fibroid    a.) CT 04/08/2021 --> multiple with largest measuring 7 cm.   Valvular regurgitation    a.) TTE 09/01/2010 --> trivial to mild pan-valvular. b.) TTE 12/21/2019 --> mild MR and AR; moderate TR. c.) TTE 07/04/2020 --> mild TR; trivial MR and PR.   Vestibular neuronitis     PAST SURGICAL HISTORY: Past Surgical History:  Procedure Laterality Date   BREAST CYST ASPIRATION Left    COLONOSCOPY  2012   TEE WITH CARDIOVERSION N/A 02/04/2020   Procedure: TEE WITH CARDIOVERSION; Location: [facility]; Surgeon:  Kandis Cocking, MD   TRANSURETHRAL RESECTION OF BLADDER TUMOR N/A 05/15/2021   Procedure: TRANSURETHRAL RESECTION OF BLADDER TUMOR (TURBT);  Surgeon: Billey Co, MD;  Location: [facility];  Service: Urology;  Laterality: N/A;    FAMILY HISTORY: Family History  Problem Relation Age of Onset   Aneurysm Mother    Colon cancer Father    Lung cancer Brother    Breast cancer Neg Hx     ADVANCED DIRECTIVES (Y/N):  N  HEALTH  MAINTENANCE: Social History   Tobacco Use   Smoking status: Former    Packs/day: 0.10    Years: 10.00    Pack years: 1.00    Types: Cigarettes   Smokeless tobacco: Never   Tobacco comments:    occasional smoker  Vaping Use   Vaping Use: Never used  Substance Use Topics   Alcohol use: Yes    Alcohol/week: 5.0 standard drinks    Types: 5 Cans of beer per week    Comment: weekly   Drug use: Not Currently    Types: 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     ELIQUIS 5 MG TABS tablet Take 5 mg by mouth 2 (two) times daily.     FEROSUL 325 (65 Fe) MG tablet Take 325 mg by mouth daily.     magnesium oxide (MAG-OX) 400 MG tablet Take 1 tablet by mouth 2 (two) times daily.     metoprolol succinate (TOPROL-XL) 100 MG 24 hr tablet Take 1 tablet (100 mg total) by mouth daily. Take with or immediately following a meal. 30 tablet 3   ondansetron (ZOFRAN) 8 MG tablet Take 1 tablet (8 mg total) by mouth 2 (two) times daily as needed. Start on the third day after cisplatin chemotherapy. 60 tablet 1   prochlorperazine (COMPAZINE) 10 MG tablet Take 1 tablet (10 mg total) by mouth every 6 (six) hours as needed (Nausea or vomiting). 60 tablet 1   trolamine salicylate (ASPERCREME) 10 % cream Apply 1 application topically as needed for muscle pain.     No current facility-administered medications for this visit.   Facility-Administered Medications Ordered in Other Visits  Medication Dose Route Frequency Provider Last Rate Last Admin   CISplatin (PLATINOL) 55 mg in sodium chloride 0.9 % 250 mL chemo infusion  35 mg/m2 (Treatment Plan Recorded) Intravenous Once Lloyd Huger, MD        dexamethasone (DECADRON) 10 mg in sodium chloride 0.9 % 50 mL IVPB  10 mg Intravenous Once Lloyd Huger, MD       fosaprepitant (EMEND) 150 mg in sodium chloride 0.9 % 145 mL IVPB  150 mg Intravenous Once Lloyd Huger, MD       magnesium sulfate IVPB 2 g 50 mL  2 g Intravenous Once Lloyd Huger, MD       magnesium sulfate IVPB 2 g 50 mL  2 g Intravenous Once Lloyd Huger, MD 50 mL/hr at 07/07/21 0947 2 g at 07/07/21 0947   palonosetron (ALOXI) injection 0.25 mg  0.25 mg Intravenous Once Lloyd Huger, MD        OBJECTIVE: Vitals:  07/07/21 0907  BP: 107/80  Pulse: 70  Resp: 18  Temp: 98 F (36.7 C)  SpO2: 100%     Body mass index is 26.26 kg/m.    ECOG FS:1 - Symptomatic but completely ambulatory  General: Well-developed, well-nourished, no acute distress.  Sitting in a wheelchair. Eyes: Pink conjunctiva, anicteric sclera. HEENT: Normocephalic, moist mucous membranes. Lungs: No audible wheezing or coughing. Heart: Regular rate and rhythm. Abdomen: Soft, nontender, no obvious distention. Musculoskeletal: No edema, cyanosis, or clubbing. Neuro: Alert, answering all questions appropriately. Cranial nerves grossly intact. Skin: No rashes or petechiae noted. Psych: Normal affect.  LAB RESULTS:  Lab Results  Component Value Date   NA 137 07/07/2021   K 3.8 07/07/2021   CL 103 07/07/2021   CO2 26 07/07/2021   GLUCOSE 104 (H) 07/07/2021   BUN 10 07/07/2021   CREATININE 0.60 07/07/2021   CALCIUM 8.3 (L) 07/07/2021   PROT 7.7 03/07/2018   ALBUMIN 2.8 (L) 03/07/2018   AST 68 (H) 03/07/2018   ALT 34 03/07/2018   ALKPHOS 73 03/07/2018   BILITOT 1.2 03/07/2018   GFRNONAA >60 07/07/2021   GFRAA >60 03/07/2018    Lab Results  Component Value Date   WBC 2.3 (L) 07/07/2021   NEUTROABS 1.1 (L) 07/07/2021   HGB 11.4 (L) 07/07/2021   HCT 33.7 (L) 07/07/2021   MCV 97.1 07/07/2021   PLT 141 (L) 07/07/2021     STUDIES: NM PET Image Initial  (PI) Skull Base To Thigh  Result Date: 06/18/2021 CLINICAL DATA:  Subsequent treatment strategy for bladder cancer. EXAM: NUCLEAR MEDICINE PET SKULL BASE TO THIGH TECHNIQUE: 7.7 mCi F-18 FDG was injected intravenously. Full-ring PET imaging was performed from the skull base to thigh after the radiotracer. CT data was obtained and used for attenuation correction and anatomic localization. Fasting blood glucose: 93 mg/dl COMPARISON:  CT abdomen 04/08/2021 FINDINGS: Mediastinal blood pool activity: SUV max 2.5 Liver activity: SUV max NA NECK: No significant abnormal hypermetabolic activity in this region. Incidental CT findings: Bilateral common carotid atherosclerotic calcification. CHEST: 0.8 cm right paratracheal lymph node, maximum SUV 2.8 which is minimally above blood pool. Incidental CT findings: Prominent and dense mitral annular calcification. Coronary, aortic arch, and branch vessel atherosclerotic vascular disease. ABDOMEN/PELVIS: No significant abnormal hypermetabolic activity in this region. Please note, high activity in excreted FDG along the collecting system could certainly obscure small urothelial lesions. The appearance of right-sided mass in the urinary bladder on prior CT of 04/08/2021 is no longer readily apparent on the CT data although the bladder is relatively nondistended. Incidental CT findings: Diffuse hepatic steatosis. Nodular liver contour compatible cirrhosis. Multiple gallstones are present in the gallbladder including a 3.0 by 5.2 by 3.0 cm stone, a variety of smaller stones, and dense calcification along the fundus of the gallbladder which could be from gallstone or early porcelain gallbladder. Porcelain gallbladder can be associated increased risk of gallbladder malignancy. Atherosclerosis is present, including aortoiliac atherosclerotic disease. Suspected 2 mm right mid kidney nonobstructive renal calculus on image 127 series 3. Enlarged fibroid uterus without significant  hypermetabolic activity. SKELETON: INSERT skip region Incidental CT findings: Severe degenerative glenohumeral arthropathy. Substantial multilevel cervical and lumbar spondylosis and degenerative disc disease. IMPRESSION: 1. No specific findings of active malignancy. An upper normal sized right paratracheal lymph node has activity just minimally above blood pool and is highly likely to be benign given the lack of intervening adenopathy. Please note that high activity in excreted FDG could obscure small urothelial lesions.  2. Other imaging findings of potential clinical significance: Prominent and dense mitral annular calcification. Aortic Atherosclerosis (ICD10-I70.0). Coronary atherosclerosis. Diffuse hepatic steatosis. Hepatic cirrhosis. 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 Uterine fibroids. Severe degenerative glenohumeral arthropathy. Substantial cervical and lumbar spondylosis. Electronically Signed   By: Van Clines M.D.   On: 06/18/2021 14:36    ASSESSMENT: Stage II bladder cancer.  PLAN:    Stage II bladder cancer: Pathology and imaging reviewed independently.  PET scan results from June 18, 2021 reviewed independently and reported as above with no specific findings of active malignancy.  Since patient is not a surgical candidate she underwent TURBT.  Plan to give concurrent weekly cisplatin 35 mg/m2 along with daily XRT for 6 to 8 weeks.  Patient did not have port placement.  Continue with daily XRT.  Proceed with cycle 3 of weekly cisplatin today.  Return to clinic in 1 week for further evaluation and consideration of cycle 4.     Hypomagnesia: Chronic and unchanged.  Proceed with 4 g IV magnesium today.   Neutropenia: Mild.  Patient's ANC is 1.1 today.  Proceed cautiously with treatment as above.   Anemia: Mild,  monitor. Thrombocytopenia: Mild, monitor.   Patient expressed understanding and was in agreement with this plan. She also understands that She can call clinic at any time with any questions, concerns, or complaints.    Cancer Staging  Bladder cancer [facility]) Staging form: Urinary Bladder, AJCC 8th Edition - Clinical stage from 06/04/2021: Stage II (cT2, cN0, cM0) - Signed by Lloyd Huger, MD on 06/04/2021 WHO/ISUP grade (low/high): High Grade Histologic grading system: 2 grade system  Lloyd Huger, MD   07/07/2021 10:01 AM

## 2021-07-07 ENCOUNTER — Inpatient Hospital Stay (HOSPITAL_BASED_OUTPATIENT_CLINIC_OR_DEPARTMENT_OTHER): Payer: Medicare Other | Admitting: Oncology

## 2021-07-07 ENCOUNTER — Other Ambulatory Visit: Payer: Self-pay

## 2021-07-07 ENCOUNTER — Ambulatory Visit
Admission: RE | Admit: 2021-07-07 | Discharge: 2021-07-07 | Disposition: A | Discharge status: Home / Self Care | Payer: Medicare Other | Source: Ambulatory Visit | Attending: Radiation Oncology | Admitting: Radiation Oncology

## 2021-07-07 ENCOUNTER — Inpatient Hospital Stay: Payer: Medicare Other

## 2021-07-07 VITALS — BP 107/80 | HR 70 | Temp 98.0°F | Resp 18 | Wt 130.0 lb

## 2021-07-07 DIAGNOSIS — C678 Malignant neoplasm of overlapping sites of bladder: Secondary | ICD-10-CM

## 2021-07-07 DIAGNOSIS — Z510 Encounter for antineoplastic radiation therapy: Secondary | ICD-10-CM | POA: Diagnosis not present

## 2021-07-07 LAB — CBC WITH DIFFERENTIAL/PLATELET
Abs Immature Granulocytes: 0 10*3/uL (ref 0.00–0.07)
Basophils Absolute: 0 10*3/uL (ref 0.0–0.1)
Basophils Relative: 0 %
Eosinophils Absolute: 0.1 10*3/uL (ref 0.0–0.5)
Eosinophils Relative: 2 %
HCT: 33.7 % — ABNORMAL LOW (ref 36.0–46.0)
Hemoglobin: 11.4 g/dL — ABNORMAL LOW (ref 12.0–15.0)
Immature Granulocytes: 0 %
Lymphocytes Relative: 43 %
Lymphs Abs: 1 10*3/uL (ref 0.7–4.0)
MCH: 32.9 pg (ref 26.0–34.0)
MCHC: 33.8 g/dL (ref 30.0–36.0)
MCV: 97.1 fL (ref 80.0–100.0)
Monocytes Absolute: 0.2 10*3/uL (ref 0.1–1.0)
Monocytes Relative: 8 %
Neutro Abs: 1.1 10*3/uL — ABNORMAL LOW (ref 1.7–7.7)
Neutrophils Relative %: 47 %
Platelets: 141 10*3/uL — ABNORMAL LOW (ref 150–400)
RBC: 3.47 MIL/uL — ABNORMAL LOW (ref 3.87–5.11)
RDW: 13.7 % (ref 11.5–15.5)
WBC: 2.3 10*3/uL — ABNORMAL LOW (ref 4.0–10.5)
nRBC: 0 % (ref 0.0–0.2)

## 2021-07-07 LAB — BASIC METABOLIC PANEL
Anion gap: 8 (ref 5–15)
BUN: 10 mg/dL (ref 8–23)
CO2: 26 mmol/L (ref 22–32)
Calcium: 8.3 mg/dL — ABNORMAL LOW (ref 8.9–10.3)
Chloride: 103 mmol/L (ref 98–111)
Creatinine, Ser: 0.6 mg/dL (ref 0.44–1.00)
GFR, Estimated: >60 mL/min (ref >60)
Glucose, Bld: 104 mg/dL — ABNORMAL HIGH (ref 70–99)
Potassium: 3.8 mmol/L (ref 3.5–5.1)
Sodium: 137 mmol/L (ref 135–145)

## 2021-07-07 LAB — MAGNESIUM: Magnesium: 1.3 mg/dL — ABNORMAL LOW (ref 1.7–2.4)

## 2021-07-07 MED ORDER — POTASSIUM CHLORIDE IN NACL 20-0.9 MEQ/L-% IV SOLN
Freq: Once | INTRAVENOUS | Status: AC
  Filled 2021-07-07: qty 1000

## 2021-07-07 MED ORDER — SODIUM CHLORIDE 0.9 % IV SOLN
Freq: Once | INTRAVENOUS | Status: AC
  Filled 2021-07-07: qty 250

## 2021-07-07 MED ORDER — SODIUM CHLORIDE 0.9 % IV SOLN
150.0000 mg | Freq: Once | INTRAVENOUS | Status: AC
  Administered 2021-07-07: 150 mg via INTRAVENOUS
  Filled 2021-07-07: qty 150

## 2021-07-07 MED ORDER — MAGNESIUM SULFATE 2 GM/50ML IV SOLN
2.0000 g | Freq: Once | INTRAVENOUS | Status: AC
  Administered 2021-07-07: 2 g via INTRAVENOUS

## 2021-07-07 MED ORDER — SODIUM CHLORIDE 0.9 % IV SOLN
35.0000 mg/m2 | Freq: Once | INTRAVENOUS | Status: AC
  Administered 2021-07-07: 55 mg via INTRAVENOUS
  Filled 2021-07-07: qty 55

## 2021-07-07 MED ORDER — PALONOSETRON HCL INJECTION 0.25 MG/5ML
0.2500 mg | Freq: Once | INTRAVENOUS | Status: AC
  Administered 2021-07-07: 0.25 mg via INTRAVENOUS

## 2021-07-07 MED ORDER — SODIUM CHLORIDE 0.9 % IV SOLN
10.0000 mg | Freq: Once | INTRAVENOUS | Status: AC
  Administered 2021-07-07: 10 mg via INTRAVENOUS
  Filled 2021-07-07: qty 10

## 2021-07-07 NOTE — Patient Instructions (Signed)
[facility]  Discharge Instructions: Thank you for choosing [facility] to provide your oncology and hematology care.  If you have a lab appointment with the Cancer Center, please go directly to the Cancer Center and check in at the registration area.  Wear comfortable clothing and clothing appropriate for easy access to any Portacath or PICC line.   We strive to give you quality time with your provider. You may need to reschedule your appointment if you arrive late (15 or more minutes).  Arriving late affects you and other patients whose appointments are after yours.  Also, if you miss three or more appointments without notifying the office, you may be dismissed from the clinic at the provider's discretion.      For prescription refill requests, have your pharmacy contact our office and allow 72 hours for refills to be completed.    Today you received the following chemotherapy and/or immunotherapy agents CISPLATIN      To help prevent nausea and vomiting after your treatment, we encourage you to take your nausea medication as directed.  BELOW ARE SYMPTOMS THAT SHOULD BE REPORTED IMMEDIATELY: *FEVER GREATER THAN 100.4 F (38 C) OR HIGHER *CHILLS OR SWEATING *NAUSEA AND VOMITING THAT IS NOT CONTROLLED WITH YOUR NAUSEA MEDICATION *UNUSUAL SHORTNESS OF BREATH *UNUSUAL BRUISING OR BLEEDING *URINARY PROBLEMS (pain or burning when urinating, or frequent urination) *BOWEL PROBLEMS (unusual diarrhea, constipation, pain near the anus) TENDERNESS IN MOUTH AND THROAT WITH OR WITHOUT PRESENCE OF ULCERS (sore throat, sores in mouth, or a toothache) UNUSUAL RASH, SWELLING OR PAIN  UNUSUAL VAGINAL DISCHARGE OR ITCHING   Items with * indicate a potential emergency and should be followed up as soon as possible or go to the Emergency Department if any problems should occur.  Please show the CHEMOTHERAPY ALERT CARD or IMMUNOTHERAPY ALERT CARD at check-in  to the Emergency Department and triage nurse.  Should you have questions after your visit or need to cancel or reschedule your appointment, please contact [facility] [place] [facility]  [phone] and follow the prompts.  Office hours are 8:00 a.m. to 4:30 p.m. Monday - Friday. Please note that voicemails left after 4:00 p.m. may not be returned until the following business day.  We are closed weekends and major holidays. You have access to a nurse at all times for urgent questions. Please call the main number to the clinic [phone] and follow the prompts.  For any non-urgent questions, you may also contact your provider using MyChart. We now offer e-Visits for anyone 18 and older to request care online for non-urgent symptoms. For details visit [email or web address].   Also download the MyChart app! Go to the app store, search "MyChart", open the app, select [facility], and log in with your MyChart username and password.  Due to Covid, a mask is required upon entering the hospital/clinic. If you do not have a mask, one will be given to you upon arrival. For doctor visits, patients may have 1 support person aged 18 or older with them. For treatment visits, patients cannot have anyone with them due to current Covid guidelines and our immunocompromised population.   Cisplatin injection What is this medication? CISPLATIN (SIS pla tin) is a chemotherapy drug. It targets fast dividing cells, like cancer cells, and causes these cells to die. This medicine is used to treat many types of cancer like bladder, ovarian, and testicular cancers. This medicine may be used for other purposes; ask your health   care provider or pharmacist if you have questions. COMMON BRAND NAME(S): Platinol, Platinol -AQ What should I tell my care team before I take this medication? They need to know if you have any of these conditions: eye disease, vision problems hearing problems kidney disease low  blood counts, like white cells, platelets, or red blood cells tingling of the fingers or toes, or other nerve disorder an unusual or allergic reaction to cisplatin, carboplatin, oxaliplatin, other medicines, foods, dyes, or preservatives pregnant or trying to get pregnant breast-feeding How should I use this medication? This drug is given as an infusion into a vein. It is administered in a hospital or clinic by a specially trained health care professional. Talk to your pediatrician regarding the use of this medicine in children. Special care may be needed. Overdosage: If you think you have taken too much of this medicine contact a poison control center or emergency room at once. NOTE: This medicine is only for you. Do not share this medicine with others. What if I miss a dose? It is important not to miss a dose. Call your doctor or health care professional if you are unable to keep an appointment. What may interact with this medication? This medicine may interact with the following medications: foscarnet certain antibiotics like amikacin, gentamicin, neomycin, polymyxin B, streptomycin, tobramycin, vancomycin This list may not describe all possible interactions. Give your health care provider a list of all the medicines, herbs, non-prescription drugs, or dietary supplements you use. Also tell them if you smoke, drink alcohol, or use illegal drugs. Some items may interact with your medicine. What should I watch for while using this medication? Your condition will be monitored carefully while you are receiving this medicine. You will need important blood work done while you are taking this medicine. This drug may make you feel generally unwell. This is not uncommon, as chemotherapy can affect healthy cells as well as cancer cells. Report any side effects. Continue your course of treatment even though you feel ill unless your doctor tells you to stop. This medicine may increase your risk of getting  an infection. Call your healthcare professional for advice if you get a fever, chills, or sore throat, or other symptoms of a cold or flu. Do not treat yourself. Try to avoid being around people who are sick. Avoid taking medicines that contain aspirin, acetaminophen, ibuprofen, naproxen, or ketoprofen unless instructed by your healthcare professional. These medicines may hide a fever. This medicine may increase your risk to bruise or bleed. Call your doctor or health care professional if you notice any unusual bleeding. Be careful brushing and flossing your teeth or using a toothpick because you may get an infection or bleed more easily. If you have any dental work done, tell your dentist you are receiving this medicine. Do not become pregnant while taking this medicine or for 14 months after stopping it. Women should inform their healthcare professional if they wish to become pregnant or think they might be pregnant. Men should not father a child while taking this medicine and for 11 months after stopping it. There is potential for serious side effects to an unborn child. Talk to your healthcare professional for more information. Do not breast-feed an infant while taking this medicine. This medicine has caused ovarian failure in some women. This medicine may make it more difficult to get pregnant. Talk to your healthcare professional if you are concerned about your fertility. This medicine has caused decreased sperm counts in some men.   This may make it more difficult to father a child. Talk to your healthcare professional if you are concerned about your fertility. Drink fluids as directed while you are taking this medicine. This will help protect your kidneys. Call your doctor or health care professional if you get diarrhea. Do not treat yourself. What side effects may I notice from receiving this medication? Side effects that you should report to your doctor or health care professional as soon as  possible: allergic reactions like skin rash, itching or hives, swelling of the face, lips, or tongue blurred vision changes in vision decreased hearing or ringing of the ears nausea, vomiting pain, redness, or irritation at site where injected pain, tingling, numbness in the hands or feet signs and symptoms of bleeding such as bloody or black, tarry stools; red or dark brown urine; spitting up blood or brown material that looks like coffee grounds; red spots on the skin; unusual bruising or bleeding from the eyes, gums, or nose signs and symptoms of infection like fever; chills; cough; sore throat; pain or trouble passing urine signs and symptoms of kidney injury like trouble passing urine or change in the amount of urine signs and symptoms of low red blood cells or anemia such as unusually weak or tired; feeling faint or lightheaded; falls; breathing problems Side effects that usually do not require medical attention (report to your doctor or health care professional if they continue or are bothersome): loss of appetite mouth sores muscle cramps This list may not describe all possible side effects. Call your doctor for medical advice about side effects. You may report side effects to FDA at 1-800-FDA-1088. Where should I keep my medication? This drug is given in a hospital or clinic and will not be stored at home. NOTE: This sheet is a summary. It may not cover all possible information. If you have questions about this medicine, talk to your doctor, pharmacist, or health care provider.  2022 Elsevier/Gold Standard (2021-04-14 00:00:00)  

## 2021-07-07 NOTE — Progress Notes (Signed)
Patient reports constipation, lack of appetite, and left side stomach "twinge" that comes and goes.

## 2021-07-08 ENCOUNTER — Ambulatory Visit
Admission: RE | Admit: 2021-07-08 | Discharge: 2021-07-08 | Disposition: A | Discharge status: Home / Self Care | Payer: Medicare Other | Source: Ambulatory Visit | Attending: Radiation Oncology | Admitting: Radiation Oncology

## 2021-07-08 DIAGNOSIS — Z510 Encounter for antineoplastic radiation therapy: Secondary | ICD-10-CM | POA: Diagnosis not present

## 2021-07-09 ENCOUNTER — Ambulatory Visit
Admission: RE | Admit: 2021-07-09 | Discharge: 2021-07-09 | Disposition: A | Discharge status: Home / Self Care | Payer: Medicare Other | Source: Ambulatory Visit | Attending: Radiation Oncology | Admitting: Radiation Oncology

## 2021-07-09 DIAGNOSIS — Z510 Encounter for antineoplastic radiation therapy: Secondary | ICD-10-CM | POA: Insufficient documentation

## 2021-07-09 DIAGNOSIS — C679 Malignant neoplasm of bladder, unspecified: Secondary | ICD-10-CM | POA: Insufficient documentation

## 2021-07-10 ENCOUNTER — Ambulatory Visit
Admission: RE | Admit: 2021-07-10 | Discharge: 2021-07-10 | Disposition: A | Discharge status: Home / Self Care | Payer: Medicare Other | Source: Ambulatory Visit | Attending: Radiation Oncology | Admitting: Radiation Oncology

## 2021-07-10 DIAGNOSIS — Z510 Encounter for antineoplastic radiation therapy: Secondary | ICD-10-CM | POA: Diagnosis not present

## 2021-07-10 NOTE — Progress Notes (Signed)
[facility]  Telephone:(336) [phone] Fax:(336) [phone]  ID: Kristina Ellis OB: 08/15/1950  MR#: [id number]  YQM#:[id number]  Patient Care Team: [facility], [facility] as PCP - General (General Practice) Lloyd Huger, MD as Consulting Physician (Hematology and Oncology)  CHIEF COMPLAINT: Stage II bladder cancer.  INTERVAL HISTORY: Patient returns to clinic today for further evaluation and consideration of cycle 4 of weekly cisplatin.  She continues to tolerate her treatments well without significant side effects.  She denies any pain or hematuria. She has chronic weakness and fatigue. She has no neurologic complaints.  She denies any recent fevers or illnesses.  She has a good appetite and denies weight loss.  She has no chest pain, shortness of breath, cough, or hemoptysis.  She denies any nausea, vomiting, constipation, or diarrhea.  She has no urinary complaints.  Patient offers no further specific complaints today.  REVIEW OF SYSTEMS:   Review of Systems  Constitutional:  Positive for malaise/fatigue. Negative for fever and weight loss.  Respiratory: Negative.  Negative for cough, hemoptysis and shortness of breath.   Cardiovascular: Negative.  Negative for chest pain and leg swelling.  Gastrointestinal: Negative.  Negative for abdominal pain.  Genitourinary:  Negative for hematuria.  Musculoskeletal: Negative.  Negative for back pain.  Skin: Negative.  Negative for rash.  Neurological:  Positive for weakness. Negative for dizziness, seizures and headaches.  Psychiatric/Behavioral: Negative.  The patient is not nervous/anxious.    As per HPI. Otherwise, a complete review of systems is negative.  PAST MEDICAL HISTORY: Past Medical History:  Diagnosis Date   Aortic atherosclerosis ([place])    Arthritis    Atrial fibrillation ([place])    a.) s/p TEE with cardioversion on 02/04/2020. b.) on daily apixaban.   Bladder mass    a.) CT 04/08/2021 -->  3.2 cm intraluminal bladder mass.   Carotid stenosis, bilateral    a.) Doppler 07/04/2020 --> mild; 1-49% stenosis BILATERALLY.   Cholelithiasis    a.) CT 11/06/2020 --> largest measured 4.5 cm   Chronic anticoagulation    a.) Apixaban   Common biliary duct calculus    a.) CT 04/08/2021 --> CBD dilated at 8 mm; 24mm calculus within distal CBD.   Diastolic dysfunction    a.) TTE 07/04/2020 --> LVEF 60-65%; G1DD.   Hepatic cirrhosis (HCC)    Hepatic steatosis    Hepatitis 1970   History of 2019 novel coronavirus disease (COVID-19) 12/20/2019   History of marijuana use    Hypertension    Hypertensive retinopathy    IDA (iron deficiency anemia)    Mitral stenosis    a.) TEE 04/10/2020 --> mild. b.) TTE 07/04/2020 --> EF 60-65%; mild (mean gradient 5 mmHg).   Nephrolithiasis    NSTEMI (non-ST elevated myocardial infarction) ([place]) 07/26/2016   PAH (pulmonary artery hypertension) ([place])    a.) TTE 12/21/2019 --> PASP 44 mmHg.   Uterine fibroid    a.) CT 04/08/2021 --> multiple with largest measuring 7 cm.   Valvular regurgitation    a.) TTE 09/01/2010 --> trivial to mild pan-valvular. b.) TTE 12/21/2019 --> mild MR and AR; moderate TR. c.) TTE 07/04/2020 --> mild TR; trivial MR and PR.   Vestibular neuronitis     PAST SURGICAL HISTORY: Past Surgical History:  Procedure Laterality Date   BREAST CYST ASPIRATION Left    COLONOSCOPY  2012   TEE WITH CARDIOVERSION N/A 02/04/2020   Procedure: TEE WITH CARDIOVERSION; Location: [facility]; Surgeon: Kandis Cocking, MD  TRANSURETHRAL RESECTION OF BLADDER TUMOR N/A 05/15/2021   Procedure: TRANSURETHRAL RESECTION OF BLADDER TUMOR (TURBT);  Surgeon: Billey Co, MD;  Location: [facility];  Service: Urology;  Laterality: N/A;    FAMILY HISTORY: Family History  Problem Relation Age of Onset   Aneurysm Mother    Colon cancer Father    Lung cancer Brother    Breast cancer Neg Hx     ADVANCED DIRECTIVES (Y/N):  N  HEALTH MAINTENANCE: Social  History   Tobacco Use   Smoking status: Former    Packs/day: 0.10    Years: 10.00    Pack years: 1.00    Types: Cigarettes   Smokeless tobacco: Never   Tobacco comments:    occasional smoker  Vaping Use   Vaping Use: Never used  Substance Use Topics   Alcohol use: Yes    Alcohol/week: 5.0 standard drinks    Types: 5 Cans of beer per week    Comment: weekly   Drug use: Not Currently    Types: 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     ELIQUIS 5 MG TABS tablet Take 5 mg by mouth 2 (two) times daily.     FEROSUL 325 (65 Fe) MG tablet Take 325 mg by mouth daily.     magnesium oxide (MAG-OX) 400 MG tablet Take 1 tablet by mouth 2 (two) times daily.     metoprolol succinate (TOPROL-XL) 100 MG 24 hr tablet Take 1 tablet (100 mg total) by mouth daily. Take with or immediately following a meal. 30 tablet 3   ondansetron (ZOFRAN) 8 MG tablet Take 1 tablet (8 mg total) by mouth 2 (two) times daily as needed. Start on the third day after cisplatin chemotherapy. 60 tablet 1   prochlorperazine (COMPAZINE) 10 MG tablet Take 1 tablet (10 mg total) by mouth every 6 (six) hours as needed (Nausea or vomiting). 60 tablet 1   trolamine salicylate (ASPERCREME) 10 % cream Apply 1 application topically as needed for muscle pain.     No current facility-administered medications for this visit.    OBJECTIVE: Vitals:   07/14/21 0851  BP: 124/85  Pulse: 61  Resp: 16  Temp: (!) 96.9 F (36.1 C)  SpO2: 99%     Body mass index is 26.03 kg/m.    ECOG FS:1 - Symptomatic but completely ambulatory  General: Well-developed, well-nourished, no acute distress.  Sitting in a wheelchair. Eyes: Pink  conjunctiva, anicteric sclera. HEENT: Normocephalic, moist mucous membranes. Lungs: No audible wheezing or coughing. Heart: Regular rate and rhythm. Abdomen: Soft, nontender, no obvious distention. Musculoskeletal: No edema, cyanosis, or clubbing. Neuro: Alert, answering all questions appropriately. Cranial nerves grossly intact. Skin: No rashes or petechiae noted. Psych: Normal affect.   LAB RESULTS:  Lab Results  Component Value Date   NA 137 07/14/2021   K 3.5 07/14/2021   CL 100 07/14/2021   CO2 24 07/14/2021   GLUCOSE 115 (H) 07/14/2021   BUN 10 07/14/2021   CREATININE 0.65 07/14/2021   CALCIUM 7.9 (L) 07/14/2021   PROT 7.7 03/07/2018   ALBUMIN 2.8 (L) 03/07/2018   AST 68 (H) 03/07/2018  ALT 34 03/07/2018   ALKPHOS 73 03/07/2018   BILITOT 1.2 03/07/2018   GFRNONAA >60 07/14/2021   GFRAA >60 03/07/2018    Lab Results  Component Value Date   WBC 1.4 (LL) 07/14/2021   NEUTROABS 0.6 (L) 07/14/2021   HGB 11.2 (L) 07/14/2021   HCT 32.6 (L) 07/14/2021   MCV 97.3 07/14/2021   PLT 108 (L) 07/14/2021     STUDIES: NM PET Image Initial (PI) Skull Base To Thigh  Result Date: 06/18/2021 CLINICAL DATA:  Subsequent treatment strategy for bladder cancer. EXAM: NUCLEAR MEDICINE PET SKULL BASE TO THIGH TECHNIQUE: 7.7 mCi F-18 FDG was injected intravenously. Full-ring PET imaging was performed from the skull base to thigh after the radiotracer. CT data was obtained and used for attenuation correction and anatomic localization. Fasting blood glucose: 93 mg/dl COMPARISON:  CT abdomen 04/08/2021 FINDINGS: Mediastinal blood pool activity: SUV max 2.5 Liver activity: SUV max NA NECK: No significant abnormal hypermetabolic activity in this region. Incidental CT findings: Bilateral common carotid atherosclerotic calcification. CHEST: 0.8 cm right paratracheal lymph node, maximum SUV 2.8 which is minimally above blood pool. Incidental CT findings: Prominent and dense mitral annular  calcification. Coronary, aortic arch, and branch vessel atherosclerotic vascular disease. ABDOMEN/PELVIS: No significant abnormal hypermetabolic activity in this region. Please note, high activity in excreted FDG along the collecting system could certainly obscure small urothelial lesions. The appearance of right-sided mass in the urinary bladder on prior CT of 04/08/2021 is no longer readily apparent on the CT data although the bladder is relatively nondistended. Incidental CT findings: Diffuse hepatic steatosis. Nodular liver contour compatible cirrhosis. Multiple gallstones are present in the gallbladder including a 3.0 by 5.2 by 3.0 cm stone, a variety of smaller stones, and dense calcification along the fundus of the gallbladder which could be from gallstone or early porcelain gallbladder. Porcelain gallbladder can be associated increased risk of gallbladder malignancy. Atherosclerosis is present, including aortoiliac atherosclerotic disease. Suspected 2 mm right mid kidney nonobstructive renal calculus on image 127 series 3. Enlarged fibroid uterus without significant hypermetabolic activity. SKELETON: INSERT skip region Incidental CT findings: Severe degenerative glenohumeral arthropathy. Substantial multilevel cervical and lumbar spondylosis and degenerative disc disease. IMPRESSION: 1. No specific findings of active malignancy. An upper normal sized right paratracheal lymph node has activity just minimally above blood pool and is highly likely to be benign given the lack of intervening adenopathy. Please note that high activity in excreted FDG could obscure small urothelial lesions. 2. Other imaging findings of potential clinical significance: Prominent and dense mitral annular calcification. Aortic Atherosclerosis (ICD10-I70.0). Coronary atherosclerosis. Diffuse hepatic steatosis. Hepatic cirrhosis. 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 Uterine fibroids. Severe degenerative glenohumeral arthropathy. Substantial cervical and lumbar spondylosis. Electronically Signed   By: Van Clines M.D.   On: 06/18/2021 14:36    ASSESSMENT: Stage II bladder cancer.  PLAN:    Stage II bladder cancer: Pathology and imaging reviewed independently.  PET scan results from June 18, 2021 reviewed independently and reported as above with no specific findings of active malignancy.  Since patient is not a surgical candidate she underwent TURBT.  Plan to give concurrent weekly cisplatin 35 mg/m2 along with daily XRT for 6 to 8 weeks.  Patient did not have port placement.  Continue with daily XRT.  Delay cycle 4 of treatment today secondary to neutropenia.  Return to clinic in 1 week for further  evaluation and reconsideration of cycle 4.   Hypomagnesia: Chronic and unchanged.  Patient will receive 4 g IV magnesium today. Neutropenia: ANC is 0.6.  Delay treatment as above. Anemia: Chronic and unchanged.  Patient's hemoglobin is 11.2 today. Thrombocytopenia: Platelets have decreased to 108.  Delay treatment as above.   Patient expressed understanding and was in agreement with this plan. She also understands that She can call clinic at any time with any questions, concerns, or complaints.    Cancer Staging  Bladder cancer [facility]) Staging form: Urinary Bladder, AJCC 8th Edition - Clinical stage from 06/04/2021: Stage II (cT2, cN0, cM0) - Signed by Lloyd Huger, MD on 06/04/2021 WHO/ISUP grade (low/high): High Grade Histologic grading system: 2 grade system  Lloyd Huger, MD   07/14/2021 12:28 PM

## 2021-07-13 ENCOUNTER — Ambulatory Visit
Admission: RE | Admit: 2021-07-13 | Discharge: 2021-07-13 | Disposition: A | Discharge status: Home / Self Care | Payer: Medicare Other | Source: Ambulatory Visit | Attending: Radiation Oncology | Admitting: Radiation Oncology

## 2021-07-13 DIAGNOSIS — Z510 Encounter for antineoplastic radiation therapy: Secondary | ICD-10-CM | POA: Diagnosis not present

## 2021-07-14 ENCOUNTER — Ambulatory Visit
Admission: RE | Admit: 2021-07-14 | Discharge: 2021-07-14 | Disposition: A | Discharge status: Home / Self Care | Payer: Medicare Other | Source: Ambulatory Visit | Attending: Radiation Oncology | Admitting: Radiation Oncology

## 2021-07-14 ENCOUNTER — Inpatient Hospital Stay (HOSPITAL_BASED_OUTPATIENT_CLINIC_OR_DEPARTMENT_OTHER): Payer: Medicare Other | Admitting: Oncology

## 2021-07-14 ENCOUNTER — Inpatient Hospital Stay: Payer: Medicare Other | Attending: Oncology

## 2021-07-14 ENCOUNTER — Inpatient Hospital Stay: Payer: Medicare Other

## 2021-07-14 ENCOUNTER — Other Ambulatory Visit: Payer: Self-pay

## 2021-07-14 VITALS — BP 124/85 | HR 61 | Temp 96.9°F | Resp 16 | Wt 128.9 lb

## 2021-07-14 DIAGNOSIS — Z800 Family history of malignant neoplasm of digestive organs: Secondary | ICD-10-CM | POA: Diagnosis not present

## 2021-07-14 DIAGNOSIS — Z801 Family history of malignant neoplasm of trachea, bronchus and lung: Secondary | ICD-10-CM | POA: Insufficient documentation

## 2021-07-14 DIAGNOSIS — I10 Essential (primary) hypertension: Secondary | ICD-10-CM | POA: Diagnosis not present

## 2021-07-14 DIAGNOSIS — Z87891 Personal history of nicotine dependence: Secondary | ICD-10-CM | POA: Diagnosis not present

## 2021-07-14 DIAGNOSIS — C678 Malignant neoplasm of overlapping sites of bladder: Secondary | ICD-10-CM

## 2021-07-14 DIAGNOSIS — C679 Malignant neoplasm of bladder, unspecified: Secondary | ICD-10-CM | POA: Insufficient documentation

## 2021-07-14 DIAGNOSIS — D709 Neutropenia, unspecified: Secondary | ICD-10-CM | POA: Diagnosis not present

## 2021-07-14 DIAGNOSIS — Z510 Encounter for antineoplastic radiation therapy: Secondary | ICD-10-CM | POA: Diagnosis not present

## 2021-07-14 DIAGNOSIS — D696 Thrombocytopenia, unspecified: Secondary | ICD-10-CM | POA: Diagnosis not present

## 2021-07-14 DIAGNOSIS — D649 Anemia, unspecified: Secondary | ICD-10-CM | POA: Insufficient documentation

## 2021-07-14 LAB — CBC WITH DIFFERENTIAL/PLATELET
Abs Immature Granulocytes: 0.01 10*3/uL (ref 0.00–0.07)
Basophils Absolute: 0 10*3/uL (ref 0.0–0.1)
Basophils Relative: 1 %
Eosinophils Absolute: 0.1 10*3/uL (ref 0.0–0.5)
Eosinophils Relative: 6 %
HCT: 32.6 % — ABNORMAL LOW (ref 36.0–46.0)
Hemoglobin: 11.2 g/dL — ABNORMAL LOW (ref 12.0–15.0)
Immature Granulocytes: 1 %
Lymphocytes Relative: 44 %
Lymphs Abs: 0.6 10*3/uL — ABNORMAL LOW (ref 0.7–4.0)
MCH: 33.4 pg (ref 26.0–34.0)
MCHC: 34.4 g/dL (ref 30.0–36.0)
MCV: 97.3 fL (ref 80.0–100.0)
Monocytes Absolute: 0.1 10*3/uL (ref 0.1–1.0)
Monocytes Relative: 8 %
Neutro Abs: 0.6 10*3/uL — ABNORMAL LOW (ref 1.7–7.7)
Neutrophils Relative %: 40 %
Platelets: 108 10*3/uL — ABNORMAL LOW (ref 150–400)
RBC: 3.35 MIL/uL — ABNORMAL LOW (ref 3.87–5.11)
RDW: 14.3 % (ref 11.5–15.5)
WBC: 1.4 10*3/uL — CL (ref 4.0–10.5)
nRBC: 0 % (ref 0.0–0.2)

## 2021-07-14 LAB — BASIC METABOLIC PANEL
Anion gap: 13 (ref 5–15)
BUN: 10 mg/dL (ref 8–23)
CO2: 24 mmol/L (ref 22–32)
Calcium: 7.9 mg/dL — ABNORMAL LOW (ref 8.9–10.3)
Chloride: 100 mmol/L (ref 98–111)
Creatinine, Ser: 0.65 mg/dL (ref 0.44–1.00)
GFR, Estimated: >60 mL/min (ref >60)
Glucose, Bld: 115 mg/dL — ABNORMAL HIGH (ref 70–99)
Potassium: 3.5 mmol/L (ref 3.5–5.1)
Sodium: 137 mmol/L (ref 135–145)

## 2021-07-14 LAB — MAGNESIUM: Magnesium: 1.3 mg/dL — ABNORMAL LOW (ref 1.7–2.4)

## 2021-07-14 MED ORDER — MAGNESIUM SULFATE 4 GM/100ML IV SOLN
4.0000 g | Freq: Once | INTRAVENOUS | Status: AC
  Administered 2021-07-14: 4 g via INTRAVENOUS
  Filled 2021-07-14: qty 100

## 2021-07-14 NOTE — Progress Notes (Signed)
Pt c/o "legs feeling tired, like the muscle aches." Pt states more in right leg than left and a "buzzy" kind of feeling. Symptoms started 2 weeks ago.

## 2021-07-14 NOTE — Patient Instructions (Signed)
°Hypomagnesemia °Hypomagnesemia is a condition in which the level of magnesium in the blood is too low. Magnesium is a mineral that is found in many foods. It is used in many different processes in the body. Hypomagnesemia can affect every organ in the body. In severe cases, it can cause life-threatening problems. °What are the causes? °This condition may be caused by: °Not getting enough magnesium in your diet or not having enough healthy foods to eat (malnutrition). °Problems with magnesium absorption in the intestines. °Dehydration. °Excessive use of alcohol. °Vomiting. °Severe or long-term (chronic) diarrhea. °Some medicines, including medicines that make you urinate more often (diuretics). °Certain diseases, such as kidney disease, diabetes, celiac disease, and overactive thyroid. °What are the signs or symptoms? °Symptoms of this condition include: °Loss of appetite, nausea, and vomiting. °Involuntary shaking or trembling of a body part (tremor). °Muscle weakness or tingling in the arms and legs. °Sudden tightening of muscles (muscle spasms). °Confusion. °Psychiatric issues, such as: °Depression and irritability. °Psychosis. °A feeling of fluttering of the heart (palpitations). °Seizures. °These symptoms are more severe if magnesium levels drop suddenly. °How is this diagnosed? °This condition may be diagnosed based on: °Your symptoms and medical history. °A physical exam. °Blood and urine tests. °How is this treated? °Treatment depends on the cause and the severity of the condition. It may be treated by: °Taking a magnesium supplement. This can be taken in pill form. If the condition is severe, magnesium is usually given through an IV. °Making changes to your diet. You may be directed to eat foods that have a lot of magnesium, such as green leafy vegetables, peas, beans, and nuts. °Not drinking alcohol. If you are struggling not to drink, ask your health care provider for help. °Follow these instructions at  home: °Eating and drinking °  °Make sure that your diet includes foods with magnesium. Foods that have a lot of magnesium in them include: °Green leafy vegetables, such as spinach and broccoli. °Beans and peas. °Nuts and seeds, such as almonds and sunflower seeds. °Whole grains, such as whole grain bread and fortified cereals. °Drink fluids that contain salts and minerals (electrolytes), such as sports drinks, when you are active. °Do not drink alcohol. °General instructions °Take over-the-counter and prescription medicines only as told by your health care provider. °Take magnesium supplements as directed if your health care provider tells you to take them. °Have your magnesium levels monitored as told by your health care provider. °Keep all follow-up visits. This is important. °Contact a health care provider if: °You get worse instead of better. °Your symptoms return. °Get help right away if: °You develop severe muscle weakness. °You have trouble breathing. °You feel that your heart is racing. °These symptoms may represent a serious problem that is an emergency. Do not wait to see if the symptoms will go away. Get medical help right away. Call your local emergency services (911 in the [place] Do not drive yourself to the hospital. °Summary °Hypomagnesemia is a condition in which the level of magnesium in the blood is too low. °Hypomagnesemia can affect every organ in the body. °Treatment may include eating more foods that contain magnesium, taking magnesium supplements, and not drinking alcohol. °Have your magnesium levels monitored as told by your health care provider. °This information is not intended to replace advice given to you by your health care provider. Make sure you discuss any questions you have with your health care provider. °Document Revised: 12/23/2020 Document Reviewed: 12/23/2020 °Elsevier Patient Education © 2022   Elsevier Inc. ° °

## 2021-07-15 ENCOUNTER — Ambulatory Visit
Admission: RE | Admit: 2021-07-15 | Discharge: 2021-07-15 | Disposition: A | Discharge status: Home / Self Care | Payer: Medicare Other | Source: Ambulatory Visit | Attending: Radiation Oncology | Admitting: Radiation Oncology

## 2021-07-15 DIAGNOSIS — Z510 Encounter for antineoplastic radiation therapy: Secondary | ICD-10-CM | POA: Diagnosis not present

## 2021-07-16 ENCOUNTER — Ambulatory Visit
Admission: RE | Admit: 2021-07-16 | Discharge: 2021-07-16 | Disposition: A | Discharge status: Home / Self Care | Payer: Medicare Other | Source: Ambulatory Visit | Attending: Radiation Oncology | Admitting: Radiation Oncology

## 2021-07-16 DIAGNOSIS — Z510 Encounter for antineoplastic radiation therapy: Secondary | ICD-10-CM | POA: Diagnosis not present

## 2021-07-17 ENCOUNTER — Ambulatory Visit
Admission: RE | Admit: 2021-07-17 | Discharge: 2021-07-17 | Disposition: A | Discharge status: Home / Self Care | Payer: Medicare Other | Source: Ambulatory Visit | Attending: Radiation Oncology | Admitting: Radiation Oncology

## 2021-07-17 DIAGNOSIS — Z510 Encounter for antineoplastic radiation therapy: Secondary | ICD-10-CM | POA: Diagnosis not present

## 2021-07-20 ENCOUNTER — Ambulatory Visit
Admission: RE | Admit: 2021-07-20 | Discharge: 2021-07-20 | Disposition: A | Discharge status: Home / Self Care | Payer: Medicare Other | Source: Ambulatory Visit | Attending: Radiation Oncology | Admitting: Radiation Oncology

## 2021-07-20 DIAGNOSIS — Z510 Encounter for antineoplastic radiation therapy: Secondary | ICD-10-CM | POA: Diagnosis not present

## 2021-07-20 NOTE — Progress Notes (Signed)
[place]  Telephone:(336) [phone] Fax:(336) [phone]  ID: Kristina Ellis OB: 1951/04/23  MR#: [id number]  JIR#:[id number]  Patient Care Team: [facility] [place] as PCP - General (General Practice) Lloyd Huger, MD as Consulting Physician (Hematology and Oncology)  CHIEF COMPLAINT: Stage II bladder cancer.  INTERVAL HISTORY: Patient returns to clinic today for further evaluation and reconsideration of cycle 4 of weekly cisplatin.  She has chronic weakness and fatigue, but otherwise is tolerating her treatments well without significant side effects.  She denies any pain or hematuria. She has no neurologic complaints.  She denies any recent fevers or illnesses.  She has a good appetite and denies weight loss.  She has no chest pain, shortness of breath, cough, or hemoptysis.  She denies any nausea, vomiting, constipation, or diarrhea.  She has no urinary complaints.  Patient offers no further specific complaints today.  REVIEW OF SYSTEMS:   Review of Systems  Constitutional:  Positive for malaise/fatigue. Negative for fever and weight loss.  Respiratory: Negative.  Negative for cough, hemoptysis and shortness of breath.   Cardiovascular: Negative.  Negative for chest pain and leg swelling.  Gastrointestinal: Negative.  Negative for abdominal pain.  Genitourinary:  Negative for hematuria.  Musculoskeletal: Negative.  Negative for back pain.  Skin: Negative.  Negative for rash.  Neurological:  Positive for weakness. Negative for dizziness, seizures and headaches.  Psychiatric/Behavioral: Negative.  The patient is not nervous/anxious.    As per HPI. Otherwise, a complete review of systems is negative.  PAST MEDICAL HISTORY: Past Medical History:  Diagnosis Date   Aortic atherosclerosis ([place])    Arthritis    Atrial fibrillation ([place])    a.) s/p TEE with cardioversion on 02/04/2020. b.) on daily apixaban.   Bladder mass    a.) CT 04/08/2021  --> 3.2 cm intraluminal bladder mass.   Carotid stenosis, bilateral    a.) Doppler 07/04/2020 --> mild; 1-49% stenosis BILATERALLY.   Cholelithiasis    a.) CT 11/06/2020 --> largest measured 4.5 cm   Chronic anticoagulation    a.) Apixaban   Common biliary duct calculus    a.) CT 04/08/2021 --> CBD dilated at 8 mm; 58mm calculus within distal CBD.   Diastolic dysfunction    a.) TTE 07/04/2020 --> LVEF 60-65%; G1DD.   Hepatic cirrhosis ([facility])    Hepatic steatosis    Hepatitis 1970   History of 2019 novel coronavirus disease (COVID-19) 12/20/2019   History of marijuana use    Hypertension    Hypertensive retinopathy    IDA (iron deficiency anemia)    Mitral stenosis    a.) TEE 04/10/2020 --> mild. b.) TTE 07/04/2020 --> EF 60-65%; mild (mean gradient 5 mmHg).   Nephrolithiasis    NSTEMI (non-ST elevated myocardial infarction) ([place]) 07/26/2016   PAH (pulmonary artery hypertension) ([place])    a.) TTE 12/21/2019 --> PASP 44 mmHg.   Uterine fibroid    a.) CT 04/08/2021 --> multiple with largest measuring 7 cm.   Valvular regurgitation    a.) TTE 09/01/2010 --> trivial to mild pan-valvular. b.) TTE 12/21/2019 --> mild MR and AR; moderate TR. c.) TTE 07/04/2020 --> mild TR; trivial MR and PR.   Vestibular neuronitis     PAST SURGICAL HISTORY: Past Surgical History:  Procedure Laterality Date   BREAST CYST ASPIRATION Left    COLONOSCOPY  2012   TEE WITH CARDIOVERSION N/A 02/04/2020   Procedure: TEE WITH CARDIOVERSION; Location: [facility]; Surgeon: Kandis Cocking, MD  TRANSURETHRAL RESECTION OF BLADDER TUMOR N/A 05/15/2021   Procedure: TRANSURETHRAL RESECTION OF BLADDER TUMOR (TURBT);  Surgeon: Billey Co, MD;  Location: [facility];  Service: Urology;  Laterality: N/A;    FAMILY HISTORY: Family History  Problem Relation Age of Onset   Aneurysm Mother    Colon cancer Father    Lung cancer Brother    Breast cancer Neg Hx     ADVANCED DIRECTIVES (Y/N):  N  HEALTH MAINTENANCE: Social  History   Tobacco Use   Smoking status: Former    Packs/day: 0.10    Years: 10.00    Pack years: 1.00    Types: Cigarettes   Smokeless tobacco: Never   Tobacco comments:    occasional smoker  Vaping Use   Vaping Use: Never used  Substance Use Topics   Alcohol use: Yes    Alcohol/week: 5.0 standard drinks    Types: 5 Cans of beer per week    Comment: weekly   Drug use: Not Currently    Types: 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     ELIQUIS 5 MG TABS tablet Take 5 mg by mouth 2 (two) times daily.     FEROSUL 325 (65 Fe) MG tablet Take 325 mg by mouth daily.     magnesium oxide (MAG-OX) 400 MG tablet Take 1 tablet by mouth 2 (two) times daily.     metoprolol succinate (TOPROL-XL) 100 MG 24 hr tablet Take 1 tablet (100 mg total) by mouth daily. Take with or immediately following a meal. 30 tablet 3   ondansetron (ZOFRAN) 8 MG tablet Take 1 tablet (8 mg total) by mouth 2 (two) times daily as needed. Start on the third day after cisplatin chemotherapy. 60 tablet 1   prochlorperazine (COMPAZINE) 10 MG tablet Take 1 tablet (10 mg total) by mouth every 6 (six) hours as needed (Nausea or vomiting). 60 tablet 1   trolamine salicylate (ASPERCREME) 10 % cream Apply 1 application topically as needed for muscle pain.     No current facility-administered medications for this visit.   Facility-Administered Medications Ordered in Other Visits  Medication Dose Route Frequency Provider Last Rate Last Admin   0.9 %  sodium chloride infusion   Intravenous Continuous Lloyd Huger, MD 10 mL/hr at 07/21/21 0956 New Bag at 07/21/21 0956    OBJECTIVE: Vitals:   07/21/21 0839  BP: 118/86   Pulse: 92  Resp: 18  Temp: (!) 96.8 F (36 C)  SpO2: 99%     Body mass index is 26.82 kg/m.    ECOG FS:1 - Symptomatic but completely ambulatory  General: Well-developed, well-nourished, no acute distress.  Sitting in a wheelchair. Eyes: Pink conjunctiva, anicteric sclera. HEENT: Normocephalic, moist mucous membranes. Lungs: No audible wheezing or coughing. Heart: Regular rate and rhythm. Abdomen: Soft, nontender, no obvious distention. Musculoskeletal: No edema, cyanosis, or clubbing. Neuro: Alert, answering all questions appropriately. Cranial nerves grossly intact. Skin: No rashes or petechiae noted. Psych: Normal affect.  LAB RESULTS:  Lab Results  Component Value Date   NA 136 07/21/2021   K 4.4 07/21/2021   CL 102 07/21/2021   CO2  25 07/21/2021   GLUCOSE 101 (H) 07/21/2021   BUN 8 07/21/2021   CREATININE 0.66 07/21/2021   CALCIUM 8.2 (L) 07/21/2021   PROT 7.7 03/07/2018   ALBUMIN 2.8 (L) 03/07/2018   AST 68 (H) 03/07/2018   ALT 34 03/07/2018   ALKPHOS 73 03/07/2018   BILITOT 1.2 03/07/2018   GFRNONAA >60 07/21/2021   GFRAA >60 03/07/2018    Lab Results  Component Value Date   WBC 1.3 (LL) 07/21/2021   NEUTROABS 0.4 (LL) 07/21/2021   HGB 10.3 (L) 07/21/2021   HCT 31.2 (L) 07/21/2021   MCV 100.0 07/21/2021   PLT 98 (L) 07/21/2021     STUDIES: No results found.  ASSESSMENT: Stage II bladder cancer.  PLAN:    Stage II bladder cancer: Pathology and imaging reviewed independently.  PET scan results from June 18, 2021 reviewed independently and reported as above with no specific findings of active malignancy.  Since patient is not a surgical candidate she underwent TURBT.  Plan to give concurrent weekly cisplatin 35 mg/m2 along with daily XRT for 6 to 8 weeks.  Patient did not have port placement.  Continue with daily XRT.  Once again delay cycle 4 of treatment today secondary to persistent neutropenia.  Return to clinic in 1 week for further evaluation  and reconsideration of treatment.   Hypomagnesia: Chronic and unchanged.  Proceed with 4 g IV magnesium today. Neutropenia: ANC 0.4.  Delay treatment as above. Anemia: Chronic and unchanged.  Patient hemoglobin is trended down slightly to 10.3. Thrombocytopenia: Platelets 98 today.  Delay treatment as above.   Patient expressed understanding and was in agreement with this plan. She also understands that She can call clinic at any time with any questions, concerns, or complaints.    Cancer Staging  Bladder cancer [facility]) Staging form: Urinary Bladder, AJCC 8th Edition - Clinical stage from 06/04/2021: Stage II (cT2, cN0, cM0) - Signed by Lloyd Huger, MD on 06/04/2021 WHO/ISUP grade (low/high): High Grade Histologic grading system: 2 grade system  Lloyd Huger, MD   07/21/2021 12:31 PM

## 2021-07-21 ENCOUNTER — Inpatient Hospital Stay: Payer: Medicare Other

## 2021-07-21 ENCOUNTER — Inpatient Hospital Stay (HOSPITAL_BASED_OUTPATIENT_CLINIC_OR_DEPARTMENT_OTHER): Payer: Medicare Other | Admitting: Oncology

## 2021-07-21 ENCOUNTER — Other Ambulatory Visit: Payer: Self-pay

## 2021-07-21 ENCOUNTER — Encounter: Payer: Self-pay | Admitting: Oncology

## 2021-07-21 ENCOUNTER — Ambulatory Visit
Admission: RE | Admit: 2021-07-21 | Discharge: 2021-07-21 | Disposition: A | Discharge status: Home / Self Care | Payer: Medicare Other | Source: Ambulatory Visit | Attending: Radiation Oncology | Admitting: Radiation Oncology

## 2021-07-21 VITALS — BP 118/86 | HR 92 | Temp 96.8°F | Resp 18 | Wt 132.8 lb

## 2021-07-21 DIAGNOSIS — C678 Malignant neoplasm of overlapping sites of bladder: Secondary | ICD-10-CM

## 2021-07-21 DIAGNOSIS — E876 Hypokalemia: Secondary | ICD-10-CM

## 2021-07-21 DIAGNOSIS — Z510 Encounter for antineoplastic radiation therapy: Secondary | ICD-10-CM | POA: Diagnosis not present

## 2021-07-21 LAB — CBC WITH DIFFERENTIAL/PLATELET
Abs Immature Granulocytes: 0 10*3/uL (ref 0.00–0.07)
Basophils Absolute: 0 10*3/uL (ref 0.0–0.1)
Basophils Relative: 1 %
Eosinophils Absolute: 0.1 10*3/uL (ref 0.0–0.5)
Eosinophils Relative: 8 %
HCT: 31.2 % — ABNORMAL LOW (ref 36.0–46.0)
Hemoglobin: 10.3 g/dL — ABNORMAL LOW (ref 12.0–15.0)
Immature Granulocytes: 0 %
Lymphocytes Relative: 52 %
Lymphs Abs: 0.7 10*3/uL (ref 0.7–4.0)
MCH: 33 pg (ref 26.0–34.0)
MCHC: 33 g/dL (ref 30.0–36.0)
MCV: 100 fL (ref 80.0–100.0)
Monocytes Absolute: 0.1 10*3/uL (ref 0.1–1.0)
Monocytes Relative: 8 %
Neutro Abs: 0.4 10*3/uL — CL (ref 1.7–7.7)
Neutrophils Relative %: 31 %
Platelets: 98 10*3/uL — ABNORMAL LOW (ref 150–400)
RBC: 3.12 MIL/uL — ABNORMAL LOW (ref 3.87–5.11)
RDW: 16.2 % — ABNORMAL HIGH (ref 11.5–15.5)
WBC: 1.3 10*3/uL — CL (ref 4.0–10.5)
nRBC: 0 % (ref 0.0–0.2)

## 2021-07-21 LAB — BASIC METABOLIC PANEL
Anion gap: 9 (ref 5–15)
BUN: 8 mg/dL (ref 8–23)
CO2: 25 mmol/L (ref 22–32)
Calcium: 8.2 mg/dL — ABNORMAL LOW (ref 8.9–10.3)
Chloride: 102 mmol/L (ref 98–111)
Creatinine, Ser: 0.66 mg/dL (ref 0.44–1.00)
GFR, Estimated: >60 mL/min (ref >60)
Glucose, Bld: 101 mg/dL — ABNORMAL HIGH (ref 70–99)
Potassium: 4.4 mmol/L (ref 3.5–5.1)
Sodium: 136 mmol/L (ref 135–145)

## 2021-07-21 LAB — MAGNESIUM: Magnesium: 1.4 mg/dL — ABNORMAL LOW (ref 1.7–2.4)

## 2021-07-21 MED ORDER — MAGNESIUM SULFATE 4 GM/100ML IV SOLN
4.0000 g | Freq: Once | INTRAVENOUS | Status: AC
  Administered 2021-07-21: 4 g via INTRAVENOUS
  Filled 2021-07-21: qty 100

## 2021-07-21 MED ORDER — MAGNESIUM SULFATE 4 GM/100ML IV SOLN: 4.0000 g | Freq: Once | INTRAVENOUS | Status: DC

## 2021-07-21 MED ORDER — SODIUM CHLORIDE 0.9 % IV SOLN
INTRAVENOUS | Status: DC
  Filled 2021-07-21: qty 250

## 2021-07-21 NOTE — Progress Notes (Signed)
Pt has no complaints at this time. Explained to pt the importance of neutropenia as she was asking about missing chemo last week.

## 2021-07-22 ENCOUNTER — Ambulatory Visit
Admission: RE | Admit: 2021-07-22 | Discharge: 2021-07-22 | Disposition: A | Discharge status: Home / Self Care | Payer: Medicare Other | Source: Ambulatory Visit | Attending: Radiation Oncology | Admitting: Radiation Oncology

## 2021-07-22 DIAGNOSIS — Z510 Encounter for antineoplastic radiation therapy: Secondary | ICD-10-CM | POA: Diagnosis not present

## 2021-07-23 ENCOUNTER — Ambulatory Visit
Admission: RE | Admit: 2021-07-23 | Discharge: 2021-07-23 | Disposition: A | Discharge status: Home / Self Care | Payer: Medicare Other | Source: Ambulatory Visit | Attending: Radiation Oncology | Admitting: Radiation Oncology

## 2021-07-23 DIAGNOSIS — Z510 Encounter for antineoplastic radiation therapy: Secondary | ICD-10-CM | POA: Diagnosis not present

## 2021-07-24 ENCOUNTER — Ambulatory Visit
Admission: RE | Admit: 2021-07-24 | Discharge: 2021-07-24 | Disposition: A | Discharge status: Home / Self Care | Payer: Medicare Other | Source: Ambulatory Visit | Attending: Radiation Oncology | Admitting: Radiation Oncology

## 2021-07-24 DIAGNOSIS — Z510 Encounter for antineoplastic radiation therapy: Secondary | ICD-10-CM | POA: Diagnosis not present

## 2021-07-27 ENCOUNTER — Ambulatory Visit
Admission: RE | Admit: 2021-07-27 | Discharge: 2021-07-27 | Disposition: A | Discharge status: Home / Self Care | Payer: Medicare Other | Source: Ambulatory Visit | Attending: Radiation Oncology | Admitting: Radiation Oncology

## 2021-07-27 DIAGNOSIS — Z510 Encounter for antineoplastic radiation therapy: Secondary | ICD-10-CM | POA: Diagnosis not present

## 2021-07-28 ENCOUNTER — Other Ambulatory Visit: Payer: Self-pay

## 2021-07-28 ENCOUNTER — Inpatient Hospital Stay (HOSPITAL_BASED_OUTPATIENT_CLINIC_OR_DEPARTMENT_OTHER): Payer: Medicare Other | Admitting: Oncology

## 2021-07-28 ENCOUNTER — Encounter: Payer: Self-pay | Admitting: Oncology

## 2021-07-28 ENCOUNTER — Inpatient Hospital Stay: Payer: Medicare Other

## 2021-07-28 ENCOUNTER — Ambulatory Visit
Admission: RE | Admit: 2021-07-28 | Discharge: 2021-07-28 | Disposition: A | Discharge status: Home / Self Care | Payer: Medicare Other | Source: Ambulatory Visit | Attending: Radiation Oncology | Admitting: Radiation Oncology

## 2021-07-28 ENCOUNTER — Ambulatory Visit: Payer: Medicare Other

## 2021-07-28 VITALS — BP 117/90 | HR 97 | Temp 98.8°F | Resp 16 | Ht 59.0 in | Wt 133.0 lb

## 2021-07-28 DIAGNOSIS — C678 Malignant neoplasm of overlapping sites of bladder: Secondary | ICD-10-CM | POA: Diagnosis not present

## 2021-07-28 DIAGNOSIS — Z510 Encounter for antineoplastic radiation therapy: Secondary | ICD-10-CM | POA: Diagnosis not present

## 2021-07-28 LAB — CBC WITH DIFFERENTIAL/PLATELET
Abs Immature Granulocytes: 0 10*3/uL (ref 0.00–0.07)
Basophils Absolute: 0 10*3/uL (ref 0.0–0.1)
Basophils Relative: 1 %
Eosinophils Absolute: 0.1 10*3/uL (ref 0.0–0.5)
Eosinophils Relative: 7 %
HCT: 30.5 % — ABNORMAL LOW (ref 36.0–46.0)
Hemoglobin: 10.4 g/dL — ABNORMAL LOW (ref 12.0–15.0)
Immature Granulocytes: 0 %
Lymphocytes Relative: 37 %
Lymphs Abs: 0.5 10*3/uL — ABNORMAL LOW (ref 0.7–4.0)
MCH: 34.8 pg — ABNORMAL HIGH (ref 26.0–34.0)
MCHC: 34.1 g/dL (ref 30.0–36.0)
MCV: 102 fL — ABNORMAL HIGH (ref 80.0–100.0)
Monocytes Absolute: 0.2 10*3/uL (ref 0.1–1.0)
Monocytes Relative: 13 %
Neutro Abs: 0.6 10*3/uL — ABNORMAL LOW (ref 1.7–7.7)
Neutrophils Relative %: 42 %
Platelets: 80 10*3/uL — ABNORMAL LOW (ref 150–400)
RBC: 2.99 MIL/uL — ABNORMAL LOW (ref 3.87–5.11)
RDW: 17.3 % — ABNORMAL HIGH (ref 11.5–15.5)
WBC: 1.4 10*3/uL — CL (ref 4.0–10.5)
nRBC: 0 % (ref 0.0–0.2)

## 2021-07-28 LAB — MAGNESIUM: Magnesium: 1.2 mg/dL — ABNORMAL LOW (ref 1.7–2.4)

## 2021-07-28 LAB — BASIC METABOLIC PANEL
Anion gap: 8 (ref 5–15)
BUN: 9 mg/dL (ref 8–23)
CO2: 24 mmol/L (ref 22–32)
Calcium: 8.2 mg/dL — ABNORMAL LOW (ref 8.9–10.3)
Chloride: 106 mmol/L (ref 98–111)
Creatinine, Ser: 0.66 mg/dL (ref 0.44–1.00)
GFR, Estimated: >60 mL/min (ref >60)
Glucose, Bld: 114 mg/dL — ABNORMAL HIGH (ref 70–99)
Potassium: 4.1 mmol/L (ref 3.5–5.1)
Sodium: 138 mmol/L (ref 135–145)

## 2021-07-28 MED ORDER — MAGNESIUM SULFATE 4 GM/100ML IV SOLN
4.0000 g | Freq: Once | INTRAVENOUS | Status: AC
  Administered 2021-07-28: 10:00:00 4 g via INTRAVENOUS
  Filled 2021-07-28: qty 100

## 2021-07-28 MED ORDER — SODIUM CHLORIDE 0.9 % IV SOLN
Freq: Once | INTRAVENOUS | Status: AC
  Filled 2021-07-28: qty 250

## 2021-07-28 NOTE — Progress Notes (Signed)
Patient would like a refill of magnesium and eliquis. Having tingling in left hand since having tx last time.

## 2021-07-28 NOTE — Progress Notes (Signed)
[place]  Telephone:(336) [phone] Fax:(336) [phone]  ID: Kristina Ellis OB: 1950-10-06  MR#: [id number]  LKG#:[id number]  Patient Care Team: [facility], [place] as PCP - General (General Practice) Lloyd Huger, MD as Consulting Physician (Hematology and Oncology)  CHIEF COMPLAINT: Stage II bladder cancer.  INTERVAL HISTORY: Kristina Ellis is a 69 year old female with past medical history significant for non-STEMI and atrial fibrillation who is followed by Dr. Grayland Ormond for stage II bladder cancer.  She is currently receiving chemotherapy with cisplatin and is status post 3 cycles.  Cycle 4 has been held secondary to neutropenia.  Today, she continues to have chronic weakness and fatigue but otherwise is tolerating treatments well.  States she is "feeling better".  Has some tingling in her toes and in her left arm after her last infusion.  Patient states she thinks part of her chemotherapy may have seeped into her surrounding tissue.  Swelling and tingling are slowly improving.  Appetite and weight are improving.  REVIEW OF SYSTEMS:   Review of Systems  Constitutional:  Positive for malaise/fatigue. Negative for chills, fever and weight loss.  HENT:  Negative for congestion, ear pain and tinnitus.   Eyes: Negative.  Negative for blurred vision and double vision.  Respiratory: Negative.  Negative for cough, sputum production and shortness of breath.   Cardiovascular: Negative.  Negative for chest pain, palpitations and leg swelling.  Gastrointestinal: Negative.  Negative for abdominal pain, constipation, diarrhea, nausea and vomiting.  Genitourinary:  Negative for dysuria, frequency and urgency.  Musculoskeletal:  Negative for back pain and falls.  Skin: Negative.  Negative for rash.  Neurological:  Positive for sensory change (Tingling in her toes and in left arm) and weakness. Negative for headaches.  Endo/Heme/Allergies: Negative.  Does not  bruise/bleed easily.  Psychiatric/Behavioral: Negative.  Negative for depression. The patient is not nervous/anxious and does not have insomnia.    As per HPI. Otherwise, a complete review of systems is negative.  PAST MEDICAL HISTORY: Past Medical History:  Diagnosis Date   Aortic atherosclerosis ([place])    Arthritis    Atrial fibrillation ([place])    a.) s/p TEE with cardioversion on 02/04/2020. b.) on daily apixaban.   Bladder mass    a.) CT 04/08/2021 --> 3.2 cm intraluminal bladder mass.   Carotid stenosis, bilateral    a.) Doppler 07/04/2020 --> mild; 1-49% stenosis BILATERALLY.   Cholelithiasis    a.) CT 11/06/2020 --> largest measured 4.5 cm   Chronic anticoagulation    a.) Apixaban   Common biliary duct calculus    a.) CT 04/08/2021 --> CBD dilated at 8 mm; 62mm calculus within distal CBD.   Diastolic dysfunction    a.) TTE 07/04/2020 --> LVEF 60-65%; G1DD.   Hepatic cirrhosis (HCC)    Hepatic steatosis    Hepatitis 1970   History of 2019 novel coronavirus disease (COVID-19) 12/20/2019   History of marijuana use    Hypertension    Hypertensive retinopathy    IDA (iron deficiency anemia)    Mitral stenosis    a.) TEE 04/10/2020 --> mild. b.) TTE 07/04/2020 --> EF 60-65%; mild (mean gradient 5 mmHg).   Nephrolithiasis    NSTEMI (non-ST elevated myocardial infarction) ([place]) 07/26/2016   PAH (pulmonary artery hypertension) ([place])    a.) TTE 12/21/2019 --> PASP 44 mmHg.   Uterine fibroid    a.) CT 04/08/2021 --> multiple with largest measuring 7 cm.   Valvular regurgitation    a.) TTE 09/01/2010 -->  trivial to mild pan-valvular. b.) TTE 12/21/2019 --> mild MR and AR; moderate TR. c.) TTE 07/04/2020 --> mild TR; trivial MR and PR.   Vestibular neuronitis     PAST SURGICAL HISTORY: Past Surgical History:  Procedure Laterality Date   BREAST CYST ASPIRATION Left    COLONOSCOPY  2012   TEE WITH CARDIOVERSION N/A 02/04/2020   Procedure: TEE WITH CARDIOVERSION; Location: [facility];  Surgeon: Kandis Cocking, MD   TRANSURETHRAL RESECTION OF BLADDER TUMOR N/A 05/15/2021   Procedure: TRANSURETHRAL RESECTION OF BLADDER TUMOR (TURBT);  Surgeon: Billey Co, MD;  Location: [facility];  Service: Urology;  Laterality: N/A;    FAMILY HISTORY: Family History  Problem Relation Age of Onset   Aneurysm Mother    Colon cancer Father    Lung cancer Brother    Breast cancer Neg Hx     ADVANCED DIRECTIVES (Y/N):  N  HEALTH MAINTENANCE: Social History   Tobacco Use   Smoking status: Former    Packs/day: 0.10    Years: 10.00    Pack years: 1.00    Types: Cigarettes   Smokeless tobacco: Never   Tobacco comments:    occasional smoker  Vaping Use   Vaping Use: Never used  Substance Use Topics   Alcohol use: Yes    Alcohol/week: 5.0 standard drinks    Types: 5 Cans of beer per week    Comment: weekly   Drug use: Not Currently    Types: 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     ELIQUIS 5 MG TABS tablet Take 5 mg by mouth 2 (two) times daily.     FEROSUL 325 (65 Fe) MG tablet Take 325 mg by mouth daily.     magnesium oxide (MAG-OX) 400 MG tablet Take 1 tablet by mouth 2 (two) times daily.     metoprolol succinate (TOPROL-XL) 100 MG 24 hr tablet Take 1 tablet (100 mg total) by mouth daily. Take with or immediately following a meal. 30 tablet 3   ondansetron (ZOFRAN) 8 MG tablet Take 1 tablet (8 mg total) by mouth 2 (two) times daily as needed. Start on the third day after cisplatin chemotherapy. 60 tablet 1   prochlorperazine (COMPAZINE) 10 MG tablet Take 1 tablet (10 mg total) by mouth every 6 (six) hours as needed (Nausea or vomiting). 60 tablet 1   trolamine salicylate  (ASPERCREME) 10 % cream Apply 1 application topically as needed for muscle pain.     No current facility-administered medications for this visit.    OBJECTIVE: Vitals:   07/28/21 0827  BP: 117/90  Pulse: 97  Resp: 16  Temp: 98.8 F (37.1 C)  SpO2: 100%     Body mass index is 26.86 kg/m.    ECOG FS:1 - Symptomatic but completely ambulatory  Physical Exam Constitutional:      Appearance: Normal appearance.  HENT:     Head: Normocephalic and atraumatic.  Eyes:     Pupils: Pupils are equal, round, and reactive to light.  Cardiovascular:     Rate and Rhythm: Normal rate. Rhythm irregular.     Heart sounds: Normal heart sounds. No murmur heard.  Pulmonary:     Effort: Pulmonary effort is normal.     Breath sounds: Normal breath sounds. No wheezing.  Abdominal:     General: Bowel sounds are normal. There is no distension.     Palpations: Abdomen is soft.     Tenderness: There is no abdominal tenderness.  Musculoskeletal:        General: Normal range of motion.     Cervical back: Normal range of motion.  Skin:    General: Skin is warm and dry.     Findings: No rash.  Neurological:     Mental Status: She is alert and oriented to person, place, and time.  Psychiatric:        Judgment: Judgment normal.    LAB RESULTS:  Lab Results  Component Value Date   NA 138 07/28/2021   K 4.1 07/28/2021   CL 106 07/28/2021   CO2 24 07/28/2021   GLUCOSE 114 (H) 07/28/2021   BUN 9 07/28/2021   CREATININE 0.66 07/28/2021   CALCIUM 8.2 (L) 07/28/2021   PROT 7.7 03/07/2018   ALBUMIN 2.8 (L) 03/07/2018   AST 68 (H) 03/07/2018   ALT 34 03/07/2018   ALKPHOS 73 03/07/2018   BILITOT 1.2 03/07/2018   GFRNONAA >60 07/28/2021   GFRAA >60 03/07/2018    Lab Results  Component Value Date   WBC 1.4 (LL) 07/28/2021   NEUTROABS 0.6 (L) 07/28/2021   HGB 10.4 (L) 07/28/2021   HCT 30.5 (L) 07/28/2021   MCV 102.0 (H) 07/28/2021   PLT 80 (L) 07/28/2021     STUDIES: No results  found.  ASSESSMENT: Stage II bladder cancer.  PLAN: Kristina Ellis is a 70 year old female who is followed by Dr. Grayland Ormond for stage II bladder cancer currently receiving chemo radiation with cisplatin.  She is here for assessment prior to cycle 4.  Clinically she is doing well.  Most recent PET scan from 06/18/2021 showed no specific findings of active malignancy.  She underwent TURBT with Dr. Franciso Bend on 05/15/2021.  Tolerating cisplatin and radiation well.  Plan is to give cisplatin along with radiation for 6 to 8 weeks.  She did not have a port placed.  Cycle 4 was delayed X 2 secondary to persistent neutropenia.  Lab work from today shows persistent neutropenia with a white count of 1.4 and ANC of 600.  Delay cycle 4 x 1 week.  Hypomagnesemia-magnesium level 1.2 today.  Proceed with 4 g of magnesium.  Neutropenia-ANC 600.  Delay treatment as above.  Anemia-hemoglobin 10.4.  Improving since holding chemo.  Thrombocytopenia-platelet count 80,000 which is a decline from last week.  Continue to monitor.  Tingling in left arm-secondary to giving cisplatin peripherally.  Does not appear to be infected.  No cellulitis.  Rotate arms for next treatment.  Disposition- Hold cycle 4 due to persistent neutropenia. Proceed with 4 g magnesium today. RTC in 1 week for lab work and possible treatment.  I spent 25 minutes dedicated to the care of this patient (face-to-face and non-face-to-face) on the date of the encounter to include what is described in the assessment and plan.  Patient expressed understanding and was in agreement with this plan. She also understands that She can call clinic at any time with any questions, concerns, or complaints.    Cancer Staging  Bladder cancer [facility]) Staging form: Urinary Bladder, AJCC 8th Edition - Clinical stage from 06/04/2021: Stage II (cT2, cN0, cM0) - Signed by Lloyd Huger, MD on 06/04/2021 WHO/ISUP grade (low/high):  High Grade Histologic grading  system: 2 grade system  Jacquelin Hawking, NP   07/28/2021 4:00 PM

## 2021-07-28 NOTE — Progress Notes (Signed)
No cisplatin today. Magnesium 4 grams IV given per orders.

## 2021-07-29 ENCOUNTER — Ambulatory Visit
Admission: RE | Admit: 2021-07-29 | Discharge: 2021-07-29 | Disposition: A | Discharge status: Home / Self Care | Payer: Medicare Other | Source: Ambulatory Visit | Attending: Radiation Oncology | Admitting: Radiation Oncology

## 2021-07-29 DIAGNOSIS — Z510 Encounter for antineoplastic radiation therapy: Secondary | ICD-10-CM | POA: Diagnosis not present

## 2021-07-30 ENCOUNTER — Ambulatory Visit
Admission: RE | Admit: 2021-07-30 | Discharge: 2021-07-30 | Disposition: A | Discharge status: Home / Self Care | Payer: Medicare Other | Source: Ambulatory Visit | Attending: Radiation Oncology | Admitting: Radiation Oncology

## 2021-07-30 DIAGNOSIS — Z510 Encounter for antineoplastic radiation therapy: Secondary | ICD-10-CM | POA: Diagnosis not present

## 2021-07-31 ENCOUNTER — Ambulatory Visit
Admission: RE | Admit: 2021-07-31 | Discharge: 2021-07-31 | Disposition: A | Discharge status: Home / Self Care | Payer: Medicare Other | Source: Ambulatory Visit | Attending: Radiation Oncology | Admitting: Radiation Oncology

## 2021-07-31 DIAGNOSIS — Z510 Encounter for antineoplastic radiation therapy: Secondary | ICD-10-CM | POA: Diagnosis not present

## 2021-08-03 NOTE — Progress Notes (Signed)
[facility]  [phone]) [phone] Fax:(336) [phone]  ID: Kristina Ellis OB: January 11, 1951  MR#: [id number]  IDP#:[id number]  Patient Care Team: [facility] [place] as PCP - General (General Practice) Lloyd Huger, MD as Consulting Physician (Hematology and Oncology)  CHIEF COMPLAINT: Stage II bladder cancer.  INTERVAL HISTORY: Patient returns to clinic today for further evaluation and reconsideration of cycle 4 of weekly cisplatin.  She has not received treatment in over a month secondary to persistent thrombocytopenia and neutropenia.  She continues to have chronic weakness and fatigue, but otherwise feels well. She denies any pain or hematuria. She has no neurologic complaints.  She denies any recent fevers or illnesses.  She has a good appetite and denies weight loss.  She has no chest pain, shortness of breath, cough, or hemoptysis.  She denies any nausea, vomiting, constipation, or diarrhea.  She has no urinary complaints.  Patient offers no further specific complaints today.  REVIEW OF SYSTEMS:   Review of Systems  Constitutional:  Positive for malaise/fatigue. Negative for fever and weight loss.  Respiratory: Negative.  Negative for cough, hemoptysis and shortness of breath.   Cardiovascular: Negative.  Negative for chest pain and leg swelling.  Gastrointestinal: Negative.  Negative for abdominal pain.  Genitourinary:  Negative for hematuria.  Musculoskeletal: Negative.  Negative for back pain.  Skin: Negative.  Negative for rash.  Neurological:  Positive for weakness. Negative for dizziness, seizures and headaches.  Psychiatric/Behavioral: Negative.  The patient is not nervous/anxious.    As per HPI. Otherwise, a complete review of systems is negative.  PAST MEDICAL HISTORY: Past Medical History:  Diagnosis Date   Aortic atherosclerosis ([place])    Arthritis    Atrial fibrillation ([place])    a.) s/p TEE with cardioversion on  02/04/2020. b.) on daily apixaban.   Bladder mass    a.) CT 04/08/2021 --> 3.2 cm intraluminal bladder mass.   Carotid stenosis, bilateral    a.) Doppler 07/04/2020 --> mild; 1-49% stenosis BILATERALLY.   Cholelithiasis    a.) CT 11/06/2020 --> largest measured 4.5 cm   Chronic anticoagulation    a.) Apixaban   Common biliary duct calculus    a.) CT 04/08/2021 --> CBD dilated at 8 mm; 52mm calculus within distal CBD.   Diastolic dysfunction    a.) TTE 07/04/2020 --> LVEF 60-65%; G1DD.   Hepatic cirrhosis ([facility])    Hepatic steatosis    Hepatitis 1970   History of 2019 novel coronavirus disease (COVID-19) 12/20/2019   History of marijuana use    Hypertension    Hypertensive retinopathy    IDA (iron deficiency anemia)    Mitral stenosis    a.) TEE 04/10/2020 --> mild. b.) TTE 07/04/2020 --> EF 60-65%; mild (mean gradient 5 mmHg).   Nephrolithiasis    NSTEMI (non-ST elevated myocardial infarction) ([place]) 07/26/2016   PAH (pulmonary artery hypertension) ([place])    a.) TTE 12/21/2019 --> PASP 44 mmHg.   Uterine fibroid    a.) CT 04/08/2021 --> multiple with largest measuring 7 cm.   Valvular regurgitation    a.) TTE 09/01/2010 --> trivial to mild pan-valvular. b.) TTE 12/21/2019 --> mild MR and AR; moderate TR. c.) TTE 07/04/2020 --> mild TR; trivial MR and PR.   Vestibular neuronitis     PAST SURGICAL HISTORY: Past Surgical History:  Procedure Laterality Date   BREAST CYST ASPIRATION Left    COLONOSCOPY  2012   TEE WITH CARDIOVERSION N/A 02/04/2020   Procedure: TEE  WITH CARDIOVERSION; Location: [facility]; Surgeon: Kandis Cocking, MD   TRANSURETHRAL RESECTION OF BLADDER TUMOR N/A 05/15/2021   Procedure: TRANSURETHRAL RESECTION OF BLADDER TUMOR (TURBT);  Surgeon: Billey Co, MD;  Location: [facility];  Service: Urology;  Laterality: N/A;    FAMILY HISTORY: Family History  Problem Relation Age of Onset   Aneurysm Mother    Colon cancer Father    Lung cancer Brother    Breast cancer Neg  Hx     ADVANCED DIRECTIVES (Y/N):  N  HEALTH MAINTENANCE: Social History   Tobacco Use   Smoking status: Former    Packs/day: 0.10    Years: 10.00    Pack years: 1.00    Types: Cigarettes   Smokeless tobacco: Never   Tobacco comments:    occasional smoker  Vaping Use   Vaping Use: Never used  Substance Use Topics   Alcohol use: Yes    Alcohol/week: 5.0 standard drinks    Types: 5 Cans of beer per week    Comment: weekly   Drug use: Not Currently    Types: 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     ELIQUIS 5 MG TABS tablet Take 5 mg by mouth 2 (two) times daily.     FEROSUL 325 (65 Fe) MG tablet Take 325 mg by mouth daily.     magnesium oxide (MAG-OX) 400 MG tablet Take 1 tablet by mouth 2 (two) times daily.     metoprolol succinate (TOPROL-XL) 100 MG 24 hr tablet Take 1 tablet (100 mg total) by mouth daily. Take with or immediately following a meal. 30 tablet 3   ondansetron (ZOFRAN) 8 MG tablet Take 1 tablet (8 mg total) by mouth 2 (two) times daily as needed. Start on the third day after cisplatin chemotherapy. 60 tablet 1   prochlorperazine (COMPAZINE) 10 MG tablet Take 1 tablet (10 mg total) by mouth every 6 (six) hours as needed (Nausea or vomiting). 60 tablet 1   trolamine salicylate (ASPERCREME) 10 % cream Apply 1 application topically as needed for muscle pain.     No current facility-administered medications for this visit.    OBJECTIVE: Vitals:   08/04/21 0840  BP: (!) 144/90  Pulse: 99  Resp: 16  Temp: (!) 97.2 F (36.2 C)  SpO2: 99%     Body mass index is 27.43 kg/m.    ECOG FS:1 - Symptomatic but completely ambulatory  General: Well-developed,  well-nourished, no acute distress.  Sitting in wheelchair. Eyes: Pink conjunctiva, anicteric sclera. HEENT: Normocephalic, moist mucous membranes. Lungs: No audible wheezing or coughing. Heart: Regular rate and rhythm. Abdomen: Soft, nontender, no obvious distention. Musculoskeletal: No edema, cyanosis, or clubbing. Neuro: Alert, answering all questions appropriately. Cranial nerves grossly intact. Skin: No rashes or petechiae noted. Psych: Normal affect.  LAB RESULTS:  Lab Results  Component Value Date   NA 138 08/04/2021   K 4.3 08/04/2021   CL 105 08/04/2021   CO2 24 08/04/2021   GLUCOSE 111 (H) 08/04/2021   BUN 10 08/04/2021   CREATININE 0.63 08/04/2021   CALCIUM 8.5 (L) 08/04/2021   PROT 7.7 03/07/2018   ALBUMIN 2.8 (L)  03/07/2018   AST 68 (H) 03/07/2018   ALT 34 03/07/2018   ALKPHOS 73 03/07/2018   BILITOT 1.2 03/07/2018   GFRNONAA >60 08/04/2021   GFRAA >60 03/07/2018    Lab Results  Component Value Date   WBC 1.8 (L) 08/04/2021   NEUTROABS 0.9 (L) 08/04/2021   HGB 10.8 (L) 08/04/2021   HCT 32.4 (L) 08/04/2021   MCV 104.2 (H) 08/04/2021   PLT 187 08/04/2021     STUDIES: No results found.  ASSESSMENT: Stage II bladder cancer.  PLAN:    Stage II bladder cancer: Pathology and imaging reviewed independently.  PET scan results from June 18, 2021 reviewed independently and reported as above with no specific findings of active malignancy.  Since patient is not a surgical candidate she underwent TURBT.  Patient did not have port placement.  Continue with daily XRT completing on August 13, 2021.  Despite mild neutropenia, will proceed with her fourth and final treatment of weekly cisplatin today.  Return to clinic in 1 week for laboratory work only and then in 2 weeks for laboratory work and further evaluation.   Hypomagnesia: Chronic and unchanged.  Proceed with 4 g IV magnesium today.  Patient will also be scheduled for 4 g of magnesium next week and in 2 weeks  when she returns to clinic. Neutropenia: ANC 0.9 today.  Proceed cautiously with treatment as above. Anemia: Chronic and unchanged.  Patient's hemoglobin is 10.8 today.   Chronic and unchanged.  Patient hemoglobin is trended down slightly to 10.3. Thrombocytopenia: Resolved.  Patient's platelet count is 187 today.  Proceed with treatment cautiously as above.   Patient expressed understanding and was in agreement with this plan. She also understands that She can call clinic at any time with any questions, concerns, or complaints.    Cancer Staging  Bladder cancer [facility]) Staging form: Urinary Bladder, AJCC 8th Edition - Clinical stage from 06/04/2021: Stage II (cT2, cN0, cM0) - Signed by Lloyd Huger, MD on 06/04/2021 WHO/ISUP grade (low/high): High Grade Histologic grading system: 2 grade system  Lloyd Huger, MD   08/04/2021 9:50 AM

## 2021-08-04 ENCOUNTER — Ambulatory Visit
Admission: RE | Admit: 2021-08-04 | Discharge: 2021-08-04 | Disposition: A | Discharge status: Home / Self Care | Payer: Medicare Other | Source: Ambulatory Visit | Attending: Radiation Oncology | Admitting: Radiation Oncology

## 2021-08-04 ENCOUNTER — Inpatient Hospital Stay (HOSPITAL_BASED_OUTPATIENT_CLINIC_OR_DEPARTMENT_OTHER): Payer: Medicare Other | Admitting: Oncology

## 2021-08-04 ENCOUNTER — Other Ambulatory Visit: Payer: Self-pay

## 2021-08-04 ENCOUNTER — Inpatient Hospital Stay: Payer: Medicare Other

## 2021-08-04 ENCOUNTER — Other Ambulatory Visit: Payer: Self-pay | Admitting: Oncology

## 2021-08-04 VITALS — BP 144/90 | HR 99 | Temp 97.2°F | Resp 16 | Wt 135.8 lb

## 2021-08-04 DIAGNOSIS — C678 Malignant neoplasm of overlapping sites of bladder: Secondary | ICD-10-CM

## 2021-08-04 DIAGNOSIS — Z510 Encounter for antineoplastic radiation therapy: Secondary | ICD-10-CM | POA: Diagnosis not present

## 2021-08-04 LAB — BASIC METABOLIC PANEL
Anion gap: 9 (ref 5–15)
BUN: 10 mg/dL (ref 8–23)
CO2: 24 mmol/L (ref 22–32)
Calcium: 8.5 mg/dL — ABNORMAL LOW (ref 8.9–10.3)
Chloride: 105 mmol/L (ref 98–111)
Creatinine, Ser: 0.63 mg/dL (ref 0.44–1.00)
GFR, Estimated: >60 mL/min (ref >60)
Glucose, Bld: 111 mg/dL — ABNORMAL HIGH (ref 70–99)
Potassium: 4.3 mmol/L (ref 3.5–5.1)
Sodium: 138 mmol/L (ref 135–145)

## 2021-08-04 LAB — CBC WITH DIFFERENTIAL/PLATELET
Abs Immature Granulocytes: 0.02 10*3/uL (ref 0.00–0.07)
Basophils Absolute: 0 10*3/uL (ref 0.0–0.1)
Basophils Relative: 1 %
Eosinophils Absolute: 0.1 10*3/uL (ref 0.0–0.5)
Eosinophils Relative: 4 %
HCT: 32.4 % — ABNORMAL LOW (ref 36.0–46.0)
Hemoglobin: 10.8 g/dL — ABNORMAL LOW (ref 12.0–15.0)
Immature Granulocytes: 1 %
Lymphocytes Relative: 33 %
Lymphs Abs: 0.6 10*3/uL — ABNORMAL LOW (ref 0.7–4.0)
MCH: 34.7 pg — ABNORMAL HIGH (ref 26.0–34.0)
MCHC: 33.3 g/dL (ref 30.0–36.0)
MCV: 104.2 fL — ABNORMAL HIGH (ref 80.0–100.0)
Monocytes Absolute: 0.2 10*3/uL (ref 0.1–1.0)
Monocytes Relative: 11 %
Neutro Abs: 0.9 10*3/uL — ABNORMAL LOW (ref 1.7–7.7)
Neutrophils Relative %: 50 %
Platelets: 187 10*3/uL (ref 150–400)
RBC: 3.11 MIL/uL — ABNORMAL LOW (ref 3.87–5.11)
RDW: 18.9 % — ABNORMAL HIGH (ref 11.5–15.5)
WBC: 1.8 10*3/uL — ABNORMAL LOW (ref 4.0–10.5)
nRBC: 0 % (ref 0.0–0.2)

## 2021-08-04 LAB — MAGNESIUM: Magnesium: 1.4 mg/dL — ABNORMAL LOW (ref 1.7–2.4)

## 2021-08-04 MED ORDER — SODIUM CHLORIDE 0.9 % IV SOLN
150.0000 mg | Freq: Once | INTRAVENOUS | Status: AC
  Administered 2021-08-04: 13:00:00 150 mg via INTRAVENOUS
  Filled 2021-08-04: qty 5

## 2021-08-04 MED ORDER — SODIUM CHLORIDE 0.9 % IV SOLN
10.0000 mg | Freq: Once | INTRAVENOUS | Status: AC
  Administered 2021-08-04: 13:00:00 10 mg via INTRAVENOUS
  Filled 2021-08-04: qty 10

## 2021-08-04 MED ORDER — SODIUM CHLORIDE 0.9 % IV SOLN
Freq: Once | INTRAVENOUS | Status: AC
  Filled 2021-08-04: qty 250

## 2021-08-04 MED ORDER — SODIUM CHLORIDE 0.9 % IV SOLN
35.0000 mg/m2 | Freq: Once | INTRAVENOUS | Status: AC
  Administered 2021-08-04: 13:00:00 55 mg via INTRAVENOUS
  Filled 2021-08-04: qty 55

## 2021-08-04 MED ORDER — MAGNESIUM SULFATE 4 GM/100ML IV SOLN
4.0000 g | Freq: Once | INTRAVENOUS | Status: AC
  Administered 2021-08-04: 10:00:00 4 g via INTRAVENOUS
  Filled 2021-08-04: qty 100

## 2021-08-04 MED ORDER — POTASSIUM CHLORIDE IN NACL 20-0.9 MEQ/L-% IV SOLN
Freq: Once | INTRAVENOUS | Status: AC
  Filled 2021-08-04: qty 1000

## 2021-08-04 MED ORDER — PALONOSETRON HCL INJECTION 0.25 MG/5ML
0.2500 mg | Freq: Once | INTRAVENOUS | Status: AC
  Administered 2021-08-04: 13:00:00 0.25 mg via INTRAVENOUS
  Filled 2021-08-04: qty 5

## 2021-08-04 NOTE — Progress Notes (Signed)
Pt c/o intermittent burning during urination and "some sort of discharge."

## 2021-08-04 NOTE — Patient Instructions (Signed)
[facility] [place]   Discharge Instructions: Thank you for choosing [facility] to provide your oncology and hematology care.  If you have a lab appointment with the [name], please go directly to the [facility] and check in at the registration area.   We strive to give you quality time with your provider. You may need to reschedule your appointment if you arrive late (15 or more minutes).  Arriving late affects you and other patients whose appointments are after yours.  Also, if you miss three or more appointments without notifying the office, you may be dismissed from the clinic at the providers discretion.      For prescription refill requests, have your pharmacy contact our office and allow 72 hours for refills to be completed.    Today you received the following chemotherapy and/or immunotherapy agents: Cisplatin.      To help prevent nausea and vomiting after your treatment, we encourage you to take your nausea medication as directed.  BELOW ARE SYMPTOMS THAT SHOULD BE REPORTED IMMEDIATELY: *FEVER GREATER THAN 100.4 F (38 C) OR HIGHER *CHILLS OR SWEATING *NAUSEA AND VOMITING THAT IS NOT CONTROLLED WITH YOUR NAUSEA MEDICATION *UNUSUAL SHORTNESS OF BREATH *UNUSUAL BRUISING OR BLEEDING *URINARY PROBLEMS (pain or burning when urinating, or frequent urination) *BOWEL PROBLEMS (unusual diarrhea, constipation, pain near the anus) TENDERNESS IN MOUTH AND THROAT WITH OR WITHOUT PRESENCE OF ULCERS (sore throat, sores in mouth, or a toothache) UNUSUAL RASH, SWELLING OR PAIN  UNUSUAL VAGINAL DISCHARGE OR ITCHING   Items with * indicate a potential emergency and should be followed up as soon as possible or go to the Emergency Department if any problems should occur.  Please show the CHEMOTHERAPY ALERT CARD or IMMUNOTHERAPY ALERT CARD at check-in to the Emergency Department and triage nurse.  Should you have questions after your visit or need  to cancel or reschedule your appointment, please contact [facility] [place] [facility]  Dept: [phone]  and follow the prompts.  Office hours are 8:00 a.m. to 4:30 p.m. Monday - Friday. Please note that voicemails left after 4:00 p.m. may not be returned until the following business day.  We are closed weekends and major holidays. You have access to a nurse at all times for urgent questions. Please call the main number to the clinic Dept: [phone] and follow the prompts.  For any non-urgent questions, you may also contact your provider using MyChart. We now offer e-Visits for anyone 57 and older to request care online for non-urgent symptoms. For details visit [email or web address].   Also download the MyChart app! Go to the app store, search "MyChart", open the app, select [facility], and log in with your MyChart username and password.  Due to Covid, a mask is required upon entering the hospital/clinic. If you do not have a mask, one will be given to you upon arrival. For doctor visits, patients may have 1 support person aged 68 or older with them. For treatment visits, patients cannot have anyone with them due to current Covid guidelines and our immunocompromised population.

## 2021-08-04 NOTE — Progress Notes (Signed)
ANC: 900, Magnesium: 1.4. MD, Dr. Grayland Ormond, notified and aware. Per MD order: proceed with scheduled Cisplatin treatment today; patient to receive Magnesium Sulfate 4 g IVPB once over 2 hours along with Cisplatin treatment today.

## 2021-08-05 ENCOUNTER — Ambulatory Visit
Admission: RE | Admit: 2021-08-05 | Discharge: 2021-08-05 | Disposition: A | Discharge status: Home / Self Care | Payer: Medicare Other | Source: Ambulatory Visit | Attending: Radiation Oncology | Admitting: Radiation Oncology

## 2021-08-05 ENCOUNTER — Ambulatory Visit: Admission: RE | Admit: 2021-08-05 | Payer: Medicare Other | Source: Ambulatory Visit

## 2021-08-05 DIAGNOSIS — Z510 Encounter for antineoplastic radiation therapy: Secondary | ICD-10-CM | POA: Diagnosis not present

## 2021-08-06 ENCOUNTER — Ambulatory Visit
Admission: RE | Admit: 2021-08-06 | Discharge: 2021-08-06 | Disposition: A | Discharge status: Home / Self Care | Payer: Medicare Other | Source: Ambulatory Visit | Attending: Radiation Oncology | Admitting: Radiation Oncology

## 2021-08-06 DIAGNOSIS — Z510 Encounter for antineoplastic radiation therapy: Secondary | ICD-10-CM | POA: Diagnosis not present

## 2021-08-07 ENCOUNTER — Ambulatory Visit
Admission: RE | Admit: 2021-08-07 | Discharge: 2021-08-07 | Disposition: A | Discharge status: Home / Self Care | Payer: Medicare Other | Source: Ambulatory Visit | Attending: Radiation Oncology | Admitting: Radiation Oncology

## 2021-08-07 DIAGNOSIS — Z510 Encounter for antineoplastic radiation therapy: Secondary | ICD-10-CM | POA: Diagnosis not present

## 2021-08-09 ENCOUNTER — Other Ambulatory Visit: Payer: Self-pay

## 2021-08-09 ENCOUNTER — Encounter: Payer: Self-pay | Admitting: Oncology

## 2021-08-09 ENCOUNTER — Emergency Department: Payer: Medicare Other

## 2021-08-09 ENCOUNTER — Inpatient Hospital Stay
Admission: EM | Admit: 2021-08-09 | Discharge: 2021-08-10 | DRG: 315 | Disposition: A | Discharge status: Home Health | Payer: Medicare Other | Attending: Internal Medicine | Admitting: Internal Medicine

## 2021-08-09 ENCOUNTER — Inpatient Hospital Stay: Payer: Medicare Other

## 2021-08-09 ENCOUNTER — Encounter: Payer: Self-pay | Admitting: Emergency Medicine

## 2021-08-09 DIAGNOSIS — M12812 Other specific arthropathies, not elsewhere classified, left shoulder: Secondary | ICD-10-CM | POA: Diagnosis present

## 2021-08-09 DIAGNOSIS — R6510 Systemic inflammatory response syndrome (SIRS) of non-infectious origin without acute organ dysfunction: Secondary | ICD-10-CM | POA: Diagnosis present

## 2021-08-09 DIAGNOSIS — R778 Other specified abnormalities of plasma proteins: Secondary | ICD-10-CM

## 2021-08-09 DIAGNOSIS — E876 Hypokalemia: Secondary | ICD-10-CM

## 2021-08-09 DIAGNOSIS — G8929 Other chronic pain: Secondary | ICD-10-CM | POA: Diagnosis present

## 2021-08-09 DIAGNOSIS — E872 Acidosis, unspecified: Secondary | ICD-10-CM | POA: Diagnosis present

## 2021-08-09 DIAGNOSIS — K760 Fatty (change of) liver, not elsewhere classified: Secondary | ICD-10-CM | POA: Diagnosis present

## 2021-08-09 DIAGNOSIS — Z8616 Personal history of COVID-19: Secondary | ICD-10-CM | POA: Diagnosis not present

## 2021-08-09 DIAGNOSIS — C678 Malignant neoplasm of overlapping sites of bladder: Secondary | ICD-10-CM

## 2021-08-09 DIAGNOSIS — I252 Old myocardial infarction: Secondary | ICD-10-CM

## 2021-08-09 DIAGNOSIS — Z8551 Personal history of malignant neoplasm of bladder: Secondary | ICD-10-CM | POA: Diagnosis not present

## 2021-08-09 DIAGNOSIS — C679 Malignant neoplasm of bladder, unspecified: Secondary | ICD-10-CM | POA: Diagnosis present

## 2021-08-09 DIAGNOSIS — K7460 Unspecified cirrhosis of liver: Secondary | ICD-10-CM | POA: Diagnosis present

## 2021-08-09 DIAGNOSIS — Z7901 Long term (current) use of anticoagulants: Secondary | ICD-10-CM | POA: Diagnosis not present

## 2021-08-09 DIAGNOSIS — I2721 Secondary pulmonary arterial hypertension: Secondary | ICD-10-CM | POA: Diagnosis present

## 2021-08-09 DIAGNOSIS — Z9221 Personal history of antineoplastic chemotherapy: Secondary | ICD-10-CM | POA: Diagnosis not present

## 2021-08-09 DIAGNOSIS — I2510 Atherosclerotic heart disease of native coronary artery without angina pectoris: Secondary | ICD-10-CM | POA: Diagnosis present

## 2021-08-09 DIAGNOSIS — I959 Hypotension, unspecified: Principal | ICD-10-CM

## 2021-08-09 DIAGNOSIS — Z923 Personal history of irradiation: Secondary | ICD-10-CM

## 2021-08-09 DIAGNOSIS — M25512 Pain in left shoulder: Secondary | ICD-10-CM

## 2021-08-09 DIAGNOSIS — Z888 Allergy status to other drugs, medicaments and biological substances status: Secondary | ICD-10-CM

## 2021-08-09 DIAGNOSIS — I700 Atherosclerosis of aorta: Secondary | ICD-10-CM | POA: Diagnosis present

## 2021-08-09 DIAGNOSIS — H4010X0 Unspecified open-angle glaucoma, stage unspecified: Secondary | ICD-10-CM | POA: Diagnosis present

## 2021-08-09 DIAGNOSIS — R7989 Other specified abnormal findings of blood chemistry: Secondary | ICD-10-CM | POA: Diagnosis present

## 2021-08-09 DIAGNOSIS — Z880 Allergy status to penicillin: Secondary | ICD-10-CM

## 2021-08-09 DIAGNOSIS — I4891 Unspecified atrial fibrillation: Secondary | ICD-10-CM

## 2021-08-09 DIAGNOSIS — Z79899 Other long term (current) drug therapy: Secondary | ICD-10-CM

## 2021-08-09 DIAGNOSIS — Z20822 Contact with and (suspected) exposure to covid-19: Secondary | ICD-10-CM | POA: Diagnosis present

## 2021-08-09 DIAGNOSIS — I10 Essential (primary) hypertension: Secondary | ICD-10-CM | POA: Diagnosis present

## 2021-08-09 DIAGNOSIS — Z87891 Personal history of nicotine dependence: Secondary | ICD-10-CM

## 2021-08-09 DIAGNOSIS — H35039 Hypertensive retinopathy, unspecified eye: Secondary | ICD-10-CM | POA: Diagnosis present

## 2021-08-09 LAB — CBC WITH DIFFERENTIAL/PLATELET
Abs Immature Granulocytes: 0.01 10*3/uL (ref 0.00–0.07)
Basophils Absolute: 0 10*3/uL (ref 0.0–0.1)
Basophils Relative: 0 %
Eosinophils Absolute: 0 10*3/uL (ref 0.0–0.5)
Eosinophils Relative: 1 %
HCT: 29.4 % — ABNORMAL LOW (ref 36.0–46.0)
Hemoglobin: 10.3 g/dL — ABNORMAL LOW (ref 12.0–15.0)
Immature Granulocytes: 0 %
Lymphocytes Relative: 22 %
Lymphs Abs: 0.7 10*3/uL (ref 0.7–4.0)
MCH: 36.1 pg — ABNORMAL HIGH (ref 26.0–34.0)
MCHC: 35 g/dL (ref 30.0–36.0)
MCV: 103.2 fL — ABNORMAL HIGH (ref 80.0–100.0)
Monocytes Absolute: 0.4 10*3/uL (ref 0.1–1.0)
Monocytes Relative: 12 %
Neutro Abs: 2 10*3/uL (ref 1.7–7.7)
Neutrophils Relative %: 65 %
Platelets: 252 10*3/uL (ref 150–400)
RBC: 2.85 MIL/uL — ABNORMAL LOW (ref 3.87–5.11)
RDW: 18 % — ABNORMAL HIGH (ref 11.5–15.5)
WBC: 3.1 10*3/uL — ABNORMAL LOW (ref 4.0–10.5)
nRBC: 1 % — ABNORMAL HIGH (ref 0.0–0.2)

## 2021-08-09 LAB — TROPONIN I (HIGH SENSITIVITY)
Troponin I (High Sensitivity): 8 ng/L (ref <18)
Troponin I (High Sensitivity): 34 ng/L — ABNORMAL HIGH (ref <18)
Troponin I (High Sensitivity): 5 ng/L (ref <18)

## 2021-08-09 LAB — COMPREHENSIVE METABOLIC PANEL
ALT: 25 U/L (ref 0–44)
AST: 48 U/L — ABNORMAL HIGH (ref 15–41)
Albumin: 2.6 g/dL — ABNORMAL LOW (ref 3.5–5.0)
Alkaline Phosphatase: 45 U/L (ref 38–126)
Anion gap: 12 (ref 5–15)
BUN: 11 mg/dL (ref 8–23)
CO2: 22 mmol/L (ref 22–32)
Calcium: 7.6 mg/dL — ABNORMAL LOW (ref 8.9–10.3)
Chloride: 100 mmol/L (ref 98–111)
Creatinine, Ser: 0.68 mg/dL (ref 0.44–1.00)
GFR, Estimated: >60 mL/min (ref >60)
Glucose, Bld: 148 mg/dL — ABNORMAL HIGH (ref 70–99)
Potassium: 2.7 mmol/L — CL (ref 3.5–5.1)
Sodium: 134 mmol/L — ABNORMAL LOW (ref 135–145)
Total Bilirubin: 1.1 mg/dL (ref 0.3–1.2)
Total Protein: 6.4 g/dL — ABNORMAL LOW (ref 6.5–8.1)

## 2021-08-09 LAB — PROCALCITONIN: Procalcitonin: <0.1 ng/mL

## 2021-08-09 LAB — URINALYSIS, ROUTINE W REFLEX MICROSCOPIC
Bacteria, UA: NONE SEEN
Bilirubin Urine: NEGATIVE
Glucose, UA: NEGATIVE mg/dL
Hgb urine dipstick: NEGATIVE
Ketones, ur: NEGATIVE mg/dL
Nitrite: NEGATIVE
Protein, ur: NEGATIVE mg/dL
Specific Gravity, Urine: 1.008 (ref 1.005–1.030)
Squamous Epithelial / HPF: NONE SEEN (ref 0–5)
pH: 8 (ref 5.0–8.0)

## 2021-08-09 LAB — TYPE AND SCREEN
ABO/RH(D): AB POS
Antibody Screen: NEGATIVE

## 2021-08-09 LAB — TSH: TSH: 1.397 u[IU]/mL (ref 0.350–4.500)

## 2021-08-09 LAB — RESP PANEL BY RT-PCR (FLU A&B, COVID) ARPGX2
Influenza A by PCR: NEGATIVE
Influenza B by PCR: NEGATIVE
SARS Coronavirus 2 by RT PCR: NEGATIVE

## 2021-08-09 LAB — MAGNESIUM: Magnesium: 1.2 mg/dL — ABNORMAL LOW (ref 1.7–2.4)

## 2021-08-09 LAB — HIV ANTIBODY (ROUTINE TESTING W REFLEX): HIV Screen 4th Generation wRfx: NONREACTIVE

## 2021-08-09 LAB — LACTIC ACID, PLASMA
Lactic Acid, Venous: 3.7 mmol/L (ref 0.5–1.9)
Lactic Acid, Venous: 1.6 mmol/L (ref 0.5–1.9)

## 2021-08-09 MED ORDER — DILTIAZEM HCL-DEXTROSE 125-5 MG/125ML-% IV SOLN (PREMIX)
5.0000 mg/h | INTRAVENOUS | Status: DC
  Administered 2021-08-09: 5 mg/h via INTRAVENOUS
  Filled 2021-08-09: qty 125

## 2021-08-09 MED ORDER — OXYCODONE-ACETAMINOPHEN 5-325 MG PO TABS
1.0000 | ORAL_TABLET | Freq: Four times a day (QID) | ORAL | Status: DC | PRN
  Administered 2021-08-09 – 2021-08-10 (×2): 1 via ORAL
  Filled 2021-08-09 (×2): qty 1

## 2021-08-09 MED ORDER — ACETAMINOPHEN 650 MG RE SUPP: 650.0000 mg | Freq: Four times a day (QID) | RECTAL | Status: DC | PRN

## 2021-08-09 MED ORDER — ACETAMINOPHEN 325 MG PO TABS
650.0000 mg | ORAL_TABLET | Freq: Four times a day (QID) | ORAL | Status: DC | PRN
  Administered 2021-08-10: 650 mg via ORAL
  Filled 2021-08-09: qty 2

## 2021-08-09 MED ORDER — ONDANSETRON HCL 4 MG/2ML IJ SOLN
4.0000 mg | Freq: Once | INTRAMUSCULAR | Status: AC
  Administered 2021-08-09: 4 mg via INTRAVENOUS
  Filled 2021-08-09: qty 2

## 2021-08-09 MED ORDER — APIXABAN 5 MG PO TABS
5.0000 mg | ORAL_TABLET | Freq: Two times a day (BID) | ORAL | Status: DC
  Administered 2021-08-09 – 2021-08-10 (×2): 5 mg via ORAL
  Filled 2021-08-09 (×2): qty 1

## 2021-08-09 MED ORDER — SODIUM CHLORIDE 0.9 % IV SOLN
2.0000 g | Freq: Once | INTRAVENOUS | Status: AC
  Administered 2021-08-09: 2 g via INTRAVENOUS
  Filled 2021-08-09: qty 2

## 2021-08-09 MED ORDER — ACETAMINOPHEN 500 MG PO TABS
1000.0000 mg | ORAL_TABLET | Freq: Once | ORAL | Status: AC
  Administered 2021-08-09: 1000 mg via ORAL
  Filled 2021-08-09: qty 2

## 2021-08-09 MED ORDER — POTASSIUM CHLORIDE CRYS ER 20 MEQ PO TBCR
40.0000 meq | EXTENDED_RELEASE_TABLET | Freq: Once | ORAL | Status: AC
  Administered 2021-08-09: 40 meq via ORAL
  Filled 2021-08-09: qty 2

## 2021-08-09 MED ORDER — METRONIDAZOLE 500 MG/100ML IV SOLN
500.0000 mg | Freq: Once | INTRAVENOUS | Status: AC
Start: 2021-08-09 — End: 2021-08-09
  Administered 2021-08-09: 500 mg via INTRAVENOUS
  Filled 2021-08-09: qty 100

## 2021-08-09 MED ORDER — POTASSIUM CHLORIDE 10 MEQ/100ML IV SOLN
10.0000 meq | INTRAVENOUS | Status: AC
  Administered 2021-08-09 (×2): 10 meq via INTRAVENOUS
  Filled 2021-08-09 (×2): qty 100

## 2021-08-09 MED ORDER — LACTATED RINGERS IV SOLN: INTRAVENOUS | Status: AC

## 2021-08-09 MED ORDER — DICLOFENAC SODIUM 25 MG PO TBEC
50.0000 mg | DELAYED_RELEASE_TABLET | Freq: Three times a day (TID) | ORAL | Status: DC
  Administered 2021-08-09 – 2021-08-10 (×2): 50 mg via ORAL
  Filled 2021-08-09 (×6): qty 2

## 2021-08-09 MED ORDER — FERROUS SULFATE 325 (65 FE) MG PO TABS
325.0000 mg | ORAL_TABLET | Freq: Every day | ORAL | Status: DC
  Administered 2021-08-09 – 2021-08-10 (×2): 325 mg via ORAL
  Filled 2021-08-09 (×2): qty 1

## 2021-08-09 MED ORDER — ONDANSETRON HCL 4 MG PO TABS: 4.0000 mg | ORAL_TABLET | Freq: Four times a day (QID) | ORAL | Status: DC | PRN

## 2021-08-09 MED ORDER — MAGNESIUM SULFATE 2 GM/50ML IV SOLN
2.0000 g | Freq: Once | INTRAVENOUS | Status: AC
  Administered 2021-08-09: 2 g via INTRAVENOUS
  Filled 2021-08-09: qty 50

## 2021-08-09 MED ORDER — FENTANYL CITRATE PF 50 MCG/ML IJ SOSY
50.0000 ug | PREFILLED_SYRINGE | Freq: Once | INTRAMUSCULAR | Status: AC
  Administered 2021-08-09: 50 ug via INTRAVENOUS
  Filled 2021-08-09: qty 1

## 2021-08-09 MED ORDER — LACTATED RINGERS IV SOLN: INTRAVENOUS | Status: DC

## 2021-08-09 MED ORDER — VANCOMYCIN HCL IN DEXTROSE 1-5 GM/200ML-% IV SOLN: 1000.0000 mg | Freq: Once | INTRAVENOUS | Status: DC

## 2021-08-09 MED ORDER — ONDANSETRON HCL 4 MG/2ML IJ SOLN
4.0000 mg | Freq: Four times a day (QID) | INTRAMUSCULAR | Status: DC | PRN
Start: 2021-08-09 — End: 2021-08-11

## 2021-08-09 MED ORDER — VANCOMYCIN HCL 1250 MG/250ML IV SOLN
1250.0000 mg | Freq: Once | INTRAVENOUS | Status: DC
  Administered 2021-08-09: 1250 mg via INTRAVENOUS
  Filled 2021-08-09: qty 250

## 2021-08-09 MED ORDER — GADOBUTROL 1 MMOL/ML IV SOLN
6.0000 mL | Freq: Once | INTRAVENOUS | Status: AC | PRN
  Administered 2021-08-09: 7.5 mL via INTRAVENOUS

## 2021-08-09 MED ORDER — POLYETHYLENE GLYCOL 3350 17 G PO PACK: 17.0000 g | PACK | Freq: Every day | ORAL | Status: DC | PRN

## 2021-08-09 MED ORDER — LACTATED RINGERS IV BOLUS (SEPSIS)
1000.0000 mL | Freq: Once | INTRAVENOUS | Status: AC
  Administered 2021-08-09: 1000 mL via INTRAVENOUS

## 2021-08-09 MED ORDER — SODIUM CHLORIDE 0.9 % IV BOLUS (SEPSIS)
1000.0000 mL | Freq: Once | INTRAVENOUS | Status: AC
  Administered 2021-08-09: 1000 mL via INTRAVENOUS

## 2021-08-09 NOTE — Sepsis Progress Note (Signed)
Elink following for Sepsis Protocol 

## 2021-08-09 NOTE — ED Notes (Signed)
Pt spoke with MRI for screening

## 2021-08-09 NOTE — Assessment & Plan Note (Addendum)
·   Unclear as to how patient's severe left shoulder pain plays into her presentation  On examination there is no evidence of crepitus, joint effusion or septic joint  X-rays reveal no obvious evidence of bony abnormality  As needed opiate-based analgesics for substantial pain  Obtaining MRI imaging to evaluate for any evidence of avascular necrosis considering patient has been receiving intravenous dexamethasone since November for ongoing chemotherapy

## 2021-08-09 NOTE — Progress Notes (Signed)
PROGRESS NOTE    Kristina Ellis  OVZ:[id number] DOB: 1951-03-03 DOA: 08/09/2021 PCP: [facility] [place]   Brief Narrative:  70 year old female presenting from home with past medical history of hypertension, atrial fibrillation (on Eliquis, S/P cardioversion 01/2020), coronary artery disease (prior NSTEMI) who presents to [facility] emergency department with complaints of left shoulder pain.  Patient reports acute onset left shoulder pain associated with weakness but denies chest pain or shortness of breath.  In the ED patient was evaluated and found to be markedly hypotensive at 65/54 with lactic acidosis, hypokalemia and negative procalcitonin.  Questionable infectious process, antibiotics initiated at intake.  Assessment & Plan:  Profound hypotension lactic acidosis, unclear etiology SIRS criteria met without signs or symptoms of infection, does not meet sepsis criteria -Profound hypotension lactic acidosis requiring IV fluids supportive care at intake -Patient declines any sick contacts recent travel or other complaints other than acute onset left shoulder pain(see below) -Continue to hold off on antibiotics unless otherwise specified per orthopedics  Acute intractable shoulder pain -Rule out infectious versus avascular process -Orthopedic consulted, MRI grossly abnormal-shows advanced inflammatory/erosive arthropathy with large joint effusion and extensive synovitis with significant rotator cuff inflammation -recommending analgesia and outpatient follow-up will likely require shoulder replacement  A. fib with RVR, resolving -Likely provoked given above, pain more appropriately well controlled now off diltiazem gtt  DVT prophylaxis: Eliquis Code Status: Full Family Communication: None present  Status is: Inpatient  Dispo: The patient is from: Home              Anticipated d/c is to: To be determined              Anticipated d/c date is: 24 to 48 hours               Patient currently not medically stable for discharge  Consultants:  Orthopedic surgery  Procedures:  None  Antimicrobials:  None indicated  Subjective: No acute issues or events overnight, left shoulder pain continues to be quite intractable with extremely poor range of motion, patient barely able to grip with her left hand without profound shoulder pain.  Otherwise denies nausea vomiting diarrhea constipation headache fevers chills or chest pain.  Objective: Vitals:   08/09/21 0400 08/09/21 0430 08/09/21 0600 08/09/21 0630  BP: (!) 141/84 123/75 133/80 (!) 162/85  Pulse: 99 84 (!) 106 88  Resp: (!) 23 20 (!) 21 (!) 21  Temp:      TempSrc:      SpO2: 100% 100% 100% 100%  Weight:      Height:        Intake/Output Summary (Last 24 hours) at 08/09/2021 0803 Last data filed at 08/09/2021 0724 Gross per 24 hour  Intake 100 ml  Output 500 ml  Net -400 ml   Filed Weights   08/09/21 0127  Weight: 61.6 kg    Examination:  General:  Pleasantly resting in bed, No acute distress. HEENT:  Normocephalic atraumatic.  Sclerae nonicteric, noninjected.  Extraocular movements intact bilaterally. Neck:  Without mass or deformity.  Trachea is midline. Lungs:  Clear to auscultate bilaterally without rhonchi, wheeze, or rales. Heart:  Regular rate and rhythm.  Without murmurs, rubs, or gallops. Abdomen:  Soft, nontender, nondistended.  Without guarding or rebound. Extremities: Exquisitely tender left shoulder, range of motion profoundly limited by pain.  Right shoulder frozen, chronic range of motion limitations. Vascular:  Dorsalis pedis and posterior tibial pulses palpable bilaterally. Skin:  Warm and dry, no erythema,  no ulcerations.   Data Reviewed: I have personally reviewed following labs and imaging studies  CBC: Recent Labs  Lab 08/04/21 0754 08/09/21 0148  WBC 1.8* 3.1*  NEUTROABS 0.9* 2.0  HGB 10.8* 10.3*  HCT 32.4* 29.4*  MCV 104.2* 103.2*  PLT 187 701   Basic  Metabolic Panel: Recent Labs  Lab 08/04/21 0754 08/09/21 0148  NA 138 134*  K 4.3 2.7*  CL 105 100  CO2 24 22  GLUCOSE 111* 148*  BUN 10 11  CREATININE 0.63 0.68  CALCIUM 8.5* 7.6*  MG 1.4* 1.2*   GFR: Estimated Creatinine Clearance: 52.3 mL/min (by C-G formula based on SCr of 0.68 mg/dL). Liver Function Tests: Recent Labs  Lab 08/09/21 0148  AST 48*  ALT 25  ALKPHOS 45  BILITOT 1.1  PROT 6.4*  ALBUMIN 2.6*   No results for input(s): LIPASE, AMYLASE in the last 168 hours. No results for input(s): AMMONIA in the last 168 hours. Coagulation Profile: No results for input(s): INR, PROTIME in the last 168 hours. Cardiac Enzymes: No results for input(s): CKTOTAL, CKMB, CKMBINDEX, TROPONINI in the last 168 hours. BNP (last 3 results) No results for input(s): PROBNP in the last 8760 hours. HbA1C: No results for input(s): HGBA1C in the last 72 hours. CBG: No results for input(s): GLUCAP in the last 168 hours. Lipid Profile: No results for input(s): CHOL, HDL, LDLCALC, TRIG, CHOLHDL, LDLDIRECT in the last 72 hours. Thyroid Function Tests: No results for input(s): TSH, T4TOTAL, FREET4, T3FREE, THYROIDAB in the last 72 hours. Anemia Panel: No results for input(s): VITAMINB12, FOLATE, FERRITIN, TIBC, IRON, RETICCTPCT in the last 72 hours. Sepsis Labs: Recent Labs  Lab 08/09/21 0148 08/09/21 0508  PROCALCITON <0.10  --   LATICACIDVEN 3.7* 1.6    Recent Results (from the past 240 hour(s))  Culture, blood (Routine X 2) w Reflex to ID Panel     Status: None (Preliminary result)   Collection Time: 08/09/21  1:46 AM   Specimen: BLOOD  Result Value Ref Range Status   Specimen Description BLOOD BLOOD RIGHT FOREARM  Final   Special Requests   Final    BOTTLES DRAWN AEROBIC AND ANAEROBIC Blood Culture results may not be optimal due to an inadequate volume of blood received in culture bottles   Culture   Final    NO GROWTH < 12 HOURS Performed at [facility], [address] [place], [place] [address]    Report Status PENDING  Incomplete  Culture, blood (Routine X 2) w Reflex to ID Panel     Status: None (Preliminary result)   Collection Time: 08/09/21  1:46 AM   Specimen: BLOOD  Result Value Ref Range Status   Specimen Description BLOOD BLOOD RIGHT FOREARM  Final   Special Requests   Final    BOTTLES DRAWN AEROBIC AND ANAEROBIC Blood Culture adequate volume   Culture   Final    NO GROWTH < 12 HOURS Performed at [facility], [address], [place],  [address]    Report Status PENDING  Incomplete  Resp Panel by RT-PCR (Flu A&B, Covid) Nasopharyngeal Swab     Status: None   Collection Time: 08/09/21  2:06 AM   Specimen: Nasopharyngeal Swab; Nasopharyngeal(NP) swabs in vial transport medium  Result Value Ref Range Status   SARS Coronavirus 2 by RT PCR NEGATIVE NEGATIVE Final    Comment: (NOTE) SARS-CoV-2 target nucleic acids are NOT DETECTED.  The SARS-CoV-2 RNA is generally detectable in upper respiratory  specimens during the acute phase of infection. The lowest concentration of SARS-CoV-2 viral copies this assay can detect is 138 copies/mL. A negative result does not preclude SARS-Cov-2 infection and should not be used as the sole basis for treatment or other patient management decisions. A negative result may occur with  improper specimen collection/handling, submission of specimen other than nasopharyngeal swab, presence of viral mutation(s) within the areas targeted by this assay, and inadequate number of viral copies(<138 copies/mL). A negative result must be combined with clinical observations, patient history, and epidemiological information. The expected result is Negative.  Fact Sheet for Patients:  EntrepreneurPulse.com.au  Fact Sheet for Healthcare Providers:  IncredibleEmployment.be  This test is no t yet approved or cleared by the [place] FDA and  has been  authorized for detection and/or diagnosis of SARS-CoV-2 by FDA under an Emergency Use Authorization (EUA). This EUA will remain  in effect (meaning this test can be used) for the duration of the COVID-19 declaration under Section 564(b)(1) of the Act, 21 U.S.C.section 360bbb-3(b)(1), unless the authorization is terminated  or revoked sooner.       Influenza A by PCR NEGATIVE NEGATIVE Final   Influenza B by PCR NEGATIVE NEGATIVE Final    Comment: (NOTE) The Xpert Xpress SARS-CoV-2/FLU/RSV plus assay is intended as an aid in the diagnosis of influenza from Nasopharyngeal swab specimens and should not be used as a sole basis for treatment. Nasal washings and aspirates are unacceptable for Xpert Xpress SARS-CoV-2/FLU/RSV testing.  Fact Sheet for Patients: EntrepreneurPulse.com.au  Fact Sheet for Healthcare Providers: IncredibleEmployment.be  This test is not yet approved or cleared by the [place] FDA and has been authorized for detection and/or diagnosis of SARS-CoV-2 by FDA under an Emergency Use Authorization (EUA). This EUA will remain in effect (meaning this test can be used) for the duration of the COVID-19 declaration under Section 564(b)(1) of the Act, 21 U.S.C. section 360bbb-3(b)(1), unless the authorization is terminated or revoked.  Performed at [facility], [address] [place], [place] [address]     Radiology Studies: DG Chest Portable 1 View  Result Date: 08/09/2021 CLINICAL DATA:  Initial evaluation for hypotension, near syncope. EXAM: PORTABLE CHEST 1 VIEW COMPARISON:  Radiograph from 07/03/2020. FINDINGS: Transverse heart size within normal limits. Mediastinal silhouette stable, and remains within normal limits. Aortic atherosclerosis. Heterogeneous calcification along the left lateral margin of the lower thoracic vertebral column noted, likely degenerative and/or reflecting costochondral calcifications. Lungs  mildly hypoinflated. No focal infiltrates. No edema or effusion. No pneumothorax. No acute osseous finding. 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 IMPRESSION: 1. No active cardiopulmonary disease. 2.  Aortic Atherosclerosis (ICD10-I70.0). Electronically Signed   By: Jeannine Boga M.D.   On: 08/09/2021 03:10   DG Shoulder Left  Result Date: 08/09/2021 CLINICAL DATA:  Initial evaluation for acute left shoulder pain. EXAM: LEFT SHOULDER - 2+ VIEW COMPARISON:  None. FINDINGS: No acute fracture or dislocation. Advanced osteoarthritic changes present about the left glenohumeral articulation. Chronic Hill-Sachs deformity at the left humeral head consistent with prior dislocation. No visible soft tissue abnormality. Visualized left hemithorax largely clear. IMPRESSION: 1. No acute osseous abnormality about the left shoulder. 2. 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 3. Chronic Hill-Sachs deformity at the left humeral head, consistent with prior dislocation. Electronically Signed   By: Jeannine Boga M.D.   On: 08/09/2021 03:06   DG Humerus Left  Result Date: 08/09/2021 CLINICAL DATA:  Initial evaluation for acute left arm  pain. EXAM: LEFT HUMERUS - 2+ VIEW COMPARISON:  Concomitant radiograph of the left shoulder. FINDINGS: No acute fracture or dislocation. Advanced osteoarthritic changes about the left shoulder, better evaluated on concomitant radiograph of the left shoulder. Osteoarthritic changes noted about the elbow as well. No visible soft tissue abnormality. IMPRESSION: 1. No acute osseous abnormality about the left humerus. 2. 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 Electronically Signed   By: Jeannine Boga M.D.   On: 08/09/2021 03:07    Scheduled Meds:  apixaban  5 mg Oral BID   ferrous sulfate  325 mg Oral Daily   Continuous Infusions:   diltiazem (CARDIZEM) infusion 5 mg/hr (08/09/21 0358)   lactated ringers 150 mL/hr at 08/09/21 0414   metronidazole 500 mg (08/09/21 0727)   vancomycin       LOS: 0 days   Time spent: 73mn  Laurent Cargile C Basheer Molchan, DO [facility]  If 7PM-7AM, please contact night-coverage [email or web address]  08/09/2021, 8:03 AM

## 2021-08-09 NOTE — ED Triage Notes (Signed)
Pt c/o left shoulder pain x 2 days.

## 2021-08-09 NOTE — Assessment & Plan Note (Signed)
·   Patient presented with substantial lactic acidosis  ER provider was concerned for underlying infection however procalcitonin is unremarkable and there is no evidence of leukocytosis or fever.  Chest x-ray unremarkable  Urinalysis unremarkable  COVID-19 and influenza PCR testing negative  Hydrating patient with intravenous isotonic fluids  Trending lactic acid levels to ensure downtrending and resolution

## 2021-08-09 NOTE — Assessment & Plan Note (Signed)
·   Unclear as to whether the hypotension precipitated the rapid atrial fibrillation or vice versa  ER provider initiated a low rate diltiazem infusion after hypotension resolved and patient seems to be tolerating this thus far  Once patient's rate is controlled patient she could likely be transitioned back to her home regimen of Toprol-XL  Patient seems to have severe hypomagnesemia which is quite possibly the most likely culprit for the rapid atrial fibrillation   Patient reports being compliant with her rate controlling medication so I do not believe noncompliance was the cause  Additionally pending TSH  Cycling cardiac enzymes  Resuming home regimen of Eliquis  Monitoring patient on telemetry

## 2021-08-09 NOTE — Assessment & Plan Note (Signed)
·   Patient exhibiting minimally elevated troponin  No evidence of chest pain or EKG changes  Will continue to cycle troponins to peak  Unlikely to be plaque rupture  Monitoring on telemetry

## 2021-08-09 NOTE — H&P (Signed)
History and Physical    SHADIA LAROSE YTW:[id number] DOB: 09/12/1950 DOA: 08/09/2021  PCP: [facility], Daniel  Patient coming from: Home   Chief Complaint:  Chief Complaint  Patient presents with   Shoulder Pain     HPI:    71 year old female with past medical history of hypertension, atrial fibrillation (on Eliquis, S/P cardioversion 01/2020), coronary artery disease (prior NSTEMI) who presents to [facility] emergency department with complaints of left shoulder pain.  Patient explains that approximately 2 days ago she began to experience left shoulder pain.  This left shoulder pain was associated with lightheadedness and weakness.  Patient denies any associated specific chest pain or shortness of breath.  Patient denies any fevers, sick contacts, recent travel or contact with confirmed COVID-19 infection.  Patient symptoms continue to persist until she eventually presented to [facility] emergency department for evaluation.  Upon evaluation in the emergency department patient was found to be hypotensive with initial blood pressure of 65/44.  Was immediately administered a liter of lactated Ringer solution with improvement in blood pressures.  Patient was additionally found to have a rectal temperature of 99.7 F and lactic acidosis of 3.7.  Chest imaging revealed no evidence of pneumonia.  X-ray of the left shoulder and humerus only revealed findings consistent with osteoarthritis.  Patient was found to be in rapid atrial fibrillation and after blood pressures had improved patient was initiated on a diltiazem infusion.  Patient was also found to be suffering from substantial hypokalemia with potassium of 2.7.  Replacement was initiated.  Patient was given broad-spectrum intravenous antibiotics due to concerns for possible developing infection.  Review of Systems:   Review of Systems  Musculoskeletal:  Positive for joint pain.   Past Medical History:  Diagnosis Date   Aortic  atherosclerosis ([place])    Arthritis    Atrial fibrillation ([place])    a.) s/p TEE with cardioversion on 02/04/2020. b.) on daily apixaban.   Bladder mass    a.) CT 04/08/2021 --> 3.2 cm intraluminal bladder mass.   Carotid stenosis, bilateral    a.) Doppler 07/04/2020 --> mild; 1-49% stenosis BILATERALLY.   Cholelithiasis    a.) CT 11/06/2020 --> largest measured 4.5 cm   Chronic anticoagulation    a.) Apixaban   Common biliary duct calculus    a.) CT 04/08/2021 --> CBD dilated at 8 mm; 75mm calculus within distal CBD.   Coronary artery disease involving native coronary artery of native heart without angina pectoris 7/71/1657   Diastolic dysfunction    a.) TTE 07/04/2020 --> LVEF 60-65%; G1DD.   Hepatic cirrhosis (HCC)    Hepatic steatosis    Hepatitis 1970   History of 2019 novel coronavirus disease (COVID-19) 12/20/2019   History of marijuana use    Hypertension    Hypertensive retinopathy    IDA (iron deficiency anemia)    Mitral stenosis    a.) TEE 04/10/2020 --> mild. b.) TTE 07/04/2020 --> EF 60-65%; mild (mean gradient 5 mmHg).   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   Uterine fibroid    a.) CT 04/08/2021 --> multiple with largest measuring 7 cm.   Valvular regurgitation    a.) TTE 09/01/2010 --> trivial to mild pan-valvular. b.) TTE 12/21/2019 --> mild MR and AR; moderate TR. c.)  TTE 07/04/2020 --> mild TR; trivial MR and PR.   Vestibular neuronitis     Past Surgical History:  Procedure Laterality Date   BREAST CYST ASPIRATION Left    COLONOSCOPY  2012   TEE WITH CARDIOVERSION N/A 02/04/2020   Procedure: TEE WITH CARDIOVERSION; Location: [facility]; Surgeon: Kandis Cocking, MD   TRANSURETHRAL RESECTION OF BLADDER TUMOR N/A 05/15/2021   Procedure:  TRANSURETHRAL RESECTION OF BLADDER TUMOR (TURBT);  Surgeon: Billey Co, MD;  Location: [facility] ORS;  Service: Urology;  Laterality: N/A;     reports that she has quit smoking. Her smoking use included cigarettes. She has a 1.00 pack-year smoking history. She has never used smokeless tobacco. She reports current alcohol use of about 5.0 standard drinks per week. She reports that she does not currently use drugs after having used the following drugs: Marijuana.  Allergies  Allergen Reactions   Atenolol Other (See Comments)    bradycardia bradycardia    Penicillins Rash    Family History  Problem Relation Age of Onset   Aneurysm Mother    Colon cancer Father    Lung cancer Brother    Breast cancer Neg Hx      Prior to Admission medications   Medication Sig Start Date End Date Taking? Authorizing Provider  amLODipine (NORVASC) 10 MG tablet Take 10 mg by mouth daily. 04/06/21   [provider]  ELIQUIS 5 MG TABS tablet Take 5 mg by mouth 2 (two) times daily. 05/13/20   [provider]  FEROSUL 325 (65 Fe) MG tablet Take 325 mg by mouth daily. 05/10/20   [provider]  magnesium oxide (MAG-OX) 400 MG tablet Take 1 tablet by mouth 2 (two) times daily. 03/24/20   [provider]  metoprolol succinate (TOPROL-XL) 100 MG 24 hr tablet Take 1 tablet (100 mg total) by mouth daily. Take with or immediately following a meal. 07/05/20   Nita Sells, MD  ondansetron (ZOFRAN) 8 MG tablet Take 1 tablet (8 mg total) by mouth 2 (two) times daily as needed. Start on the third day after cisplatin chemotherapy. 06/05/21   Lloyd Huger, MD  prochlorperazine (COMPAZINE) 10 MG tablet Take 1 tablet (10 mg total) by mouth every 6 (six) hours as needed (Nausea or vomiting). 06/05/21   Lloyd Huger, MD  trolamine salicylate (ASPERCREME) 10 % cream Apply 1 application topically as needed for muscle pain.    [provider]    Physical  Exam: Vitals:   08/09/21 0630 08/09/21 0700 08/09/21 0730 08/09/21 0800  BP: (!) 162/85 120/79 133/77 134/75  Pulse: 88 77 60 77  Resp: (!) 21 14 13 14   Temp:      TempSrc:      SpO2: 100% 100% 100% 100%  Weight:      Height:        Constitutional: Awake alert and oriented x3, no associated distress.  Patient is in distress due to shoulder pain. Skin: no rashes, no lesions, good skin turgor noted. Eyes: Pupils are equally reactive to light.  No evidence of scleral icterus or conjunctival pallor.  ENMT: Moist mucous membranes noted.  Posterior pharynx clear of any exudate or lesions.   Neck: normal, supple, no masses, no thyromegaly.  No evidence of jugular venous distension.   Respiratory: clear to auscultation bilaterally, no wheezing, no crackles. Normal respiratory effort. No accessory muscle use.  Cardiovascular: Rapid and irregularly irregular rate and rhythm.  No murmurs / rubs / gallops. No extremity edema.  2+ pedal pulses. No carotid bruits.  Chest:   Nontender without crepitus or deformity.   Back:   Nontender without crepitus or deformity. Abdomen: Abdomen is soft and nontender.  No evidence of intra-abdominal masses.  Positive bowel sounds noted in all quadrants.   Musculoskeletal: No joint deformity upper and lower extremities. Good ROM, no contractures. Normal muscle tone.  Neurologic: CN 2-12 grossly intact. Sensation intact.  Patient moving all 4 extremities spontaneously.  Patient is following all commands.  Patient is responsive to verbal stimuli.   Psychiatric: Patient exhibits anxious mood with appropriate affect.  Patient seems to possess insight as to their current situation.     Labs on Admission: I have personally reviewed following labs and imaging studies -   CBC: Recent Labs  Lab 08/04/21 0754 08/09/21 0148  WBC 1.8* 3.1*  NEUTROABS 0.9* 2.0  HGB 10.8* 10.3*  HCT 32.4* 29.4*  MCV 104.2* 103.2*  PLT 187 950   Basic Metabolic Panel: Recent Labs  Lab  08/04/21 0754 08/09/21 0148  NA 138 134*  K 4.3 2.7*  CL 105 100  CO2 24 22  GLUCOSE 111* 148*  BUN 10 11  CREATININE 0.63 0.68  CALCIUM 8.5* 7.6*  MG 1.4* 1.2*   GFR: Estimated Creatinine Clearance: 52.3 mL/min (by C-G formula based on SCr of 0.68 mg/dL). Liver Function Tests: Recent Labs  Lab 08/09/21 0148  AST 48*  ALT 25  ALKPHOS 45  BILITOT 1.1  PROT 6.4*  ALBUMIN 2.6*   No results for input(s): LIPASE, AMYLASE in the last 168 hours. No results for input(s): AMMONIA in the last 168 hours. Coagulation Profile: No results for input(s): INR, PROTIME in the last 168 hours. Cardiac Enzymes: No results for input(s): CKTOTAL, CKMB, CKMBINDEX, TROPONINI in the last 168 hours. BNP (last 3 results) No results for input(s): PROBNP in the last 8760 hours. HbA1C: No results for input(s): HGBA1C in the last 72 hours. CBG: No results for input(s): GLUCAP in the last 168 hours. Lipid Profile: No results for input(s): CHOL, HDL, LDLCALC, TRIG, CHOLHDL, LDLDIRECT in the last 72 hours. Thyroid Function Tests: No results for input(s): TSH, T4TOTAL, FREET4, T3FREE, THYROIDAB in the last 72 hours. Anemia Panel: No results for input(s): VITAMINB12, FOLATE, FERRITIN, TIBC, IRON, RETICCTPCT in the last 72 hours. Urine analysis:    Component Value Date/Time   COLORURINE YELLOW (A) 08/09/2021 0148   APPEARANCEUR CLEAR (A) 08/09/2021 0148   APPEARANCEUR Cloudy (A) 05/04/2021 1110   LABSPEC 1.008 08/09/2021 0148   PHURINE 8.0 08/09/2021 0148   GLUCOSEU NEGATIVE 08/09/2021 0148   HGBUR NEGATIVE 08/09/2021 0148   BILIRUBINUR NEGATIVE 08/09/2021 0148   BILIRUBINUR Negative 05/04/2021 1110   KETONESUR NEGATIVE 08/09/2021 0148   PROTEINUR NEGATIVE 08/09/2021 0148   NITRITE NEGATIVE 08/09/2021 0148   LEUKOCYTESUR TRACE (A) 08/09/2021 0148    Radiological Exams on Admission - Personally Reviewed: DG Chest Portable 1 View  Result Date: 08/09/2021 CLINICAL DATA:  Initial evaluation for  hypotension, near syncope. EXAM: PORTABLE CHEST 1 VIEW COMPARISON:  Radiograph from 07/03/2020. FINDINGS: Transverse heart size within normal limits. Mediastinal silhouette stable, and remains within normal limits. Aortic atherosclerosis. Heterogeneous calcification along the left lateral margin of the lower thoracic vertebral column noted, likely degenerative and/or reflecting costochondral calcifications. Lungs mildly hypoinflated. No focal infiltrates. No edema or effusion. No pneumothorax. No acute osseous finding. 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 IMPRESSION: 1. No active cardiopulmonary disease. 2.  Aortic Atherosclerosis (ICD10-I70.0). Electronically Signed  By: Jeannine Boga M.D.   On: 08/09/2021 03:10   DG Shoulder Left  Result Date: 08/09/2021 CLINICAL DATA:  Initial evaluation for acute left shoulder pain. EXAM: LEFT SHOULDER - 2+ VIEW COMPARISON:  None. FINDINGS: No acute fracture or dislocation. Advanced osteoarthritic changes present about the left glenohumeral articulation. Chronic Hill-Sachs deformity at the left humeral head consistent with prior dislocation. No visible soft tissue abnormality. Visualized left hemithorax largely clear. IMPRESSION: 1. No acute osseous abnormality about the left shoulder. 2. 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 3. Chronic Hill-Sachs deformity at the left humeral head, consistent with prior dislocation. Electronically Signed   By: Jeannine Boga M.D.   On: 08/09/2021 03:06   DG Humerus Left  Result Date: 08/09/2021 CLINICAL DATA:  Initial evaluation for acute left arm pain. EXAM: LEFT HUMERUS - 2+ VIEW COMPARISON:  Concomitant radiograph of the left shoulder. FINDINGS: No acute fracture or dislocation. Advanced osteoarthritic changes about the left shoulder, better evaluated on concomitant radiograph of the left shoulder. Osteoarthritic changes noted about the elbow as well. No  visible soft tissue abnormality. IMPRESSION: 1. No acute osseous abnormality about the left humerus. 2. 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 Electronically Signed   By: Jeannine Boga M.D.   On: 08/09/2021 03:07    EKG: Personally reviewed.  Rhythm is atrial fibrillation with rapid ventricular response with heart rate of 101 bpm.  No dynamic ST segment changes appreciated.  Assessment/Plan  * Hypotension- (present on admission) Patient initially presenting with severe hypotension Etiology unclear.  Patient denies taking any new antihypertensives and denies any recent bouts of diarrhea nausea or vomiting. While patient does complain of severe left shoulder pain this does not appear to be an infectious process on my exam and therefore I do not believe this is contributing to the hypotension I believe patient's hypotension and has been so severe that it has been the cause of the patient's transient lactic acidosis  Blood pressure seem to be improving with intravenous fluids Performing work-up for infection including chest imaging, urinalysis, blood cultures as well as COVID/influenza testing, CRP and procalcitonin Monitoring for recurrence as we attempt to achieve rate control of the patient's rapid atrial fibrillation  Atrial fibrillation with RVR ([place])- (present on admission) Unclear as to whether the hypotension precipitated the rapid atrial fibrillation or vice versa ER provider initiated a low rate diltiazem infusion after hypotension resolved and patient seems to be tolerating this thus far Once patient's rate is controlled patient she could likely be transitioned back to her home regimen of Toprol-XL Patient seems to have severe hypomagnesemia which is quite possibly the most likely culprit for the rapid atrial fibrillation  Patient reports being compliant with her rate controlling medication so I do not  believe noncompliance was the cause Additionally pending TSH Cycling cardiac enzymes Resuming home regimen of Eliquis Monitoring patient on telemetry  Lactic acidosis- (present on admission) Patient presented with substantial lactic acidosis ER provider was concerned for underlying infection however procalcitonin is unremarkable and there is no evidence of leukocytosis or fever. Chest x-ray unremarkable Urinalysis unremarkable COVID-19 and influenza PCR testing negative Hydrating patient with intravenous isotonic fluids Trending lactic acid levels to ensure downtrending and resolution  Hypokalemia- (present on admission) Patient presenting with substantial hypokalemia Patient denies any diuretic use or any recent poor oral intake or gastrointestinal losses I therefore believe the hypokalemia is likely a product of the severe hypomagnesemia Replenishing with  4 g of intravenous magnesium sulfate as well as intravenous and oral potassium chloride  Acute pain of left shoulder- (present on admission) Unclear as to how patient's severe left shoulder pain plays into her presentation On examination there is no evidence of crepitus, joint effusion or septic joint X-rays reveal no obvious evidence of bony abnormality As needed opiate-based analgesics for substantial pain Obtaining MRI imaging to evaluate for any evidence of avascular necrosis considering patient has been receiving intravenous dexamethasone since November for ongoing chemotherapy   Elevated troponin level not due myocardial infarction- (present on admission) Patient exhibiting minimally elevated troponin No evidence of chest pain or EKG changes Will continue to cycle troponins to peak Unlikely to be plaque rupture Monitoring on telemetry  Bladder cancer [facility])- (present on admission) Outpatient management by Dr. Grayland Ormond Actively receiving weekly cisplatin infusions  Also actively receiving radiation therapy  Continue  outpatient follow-up     Code Status:  Full code  code status decision has been confirmed with: patient Family Communication: deferred   Status is: Inpatient  Remains inpatient appropriate because: Rapid atrial fibrillation requiring titration of AV nodal blocking agents with severe electrolyte abnormalities requiring replacement via both oral and intravenous means as well as aggressive intravenous volume sedation due to hypotension.        Vernelle Emerald MD [facility] Pager [phone]  If 7PM-7AM, please contact night-coverage [email or web address] Use universal Icard password for that web site. If you do not have the password, please call the hospital operator.  08/09/2021, 9:39 AM

## 2021-08-09 NOTE — ED Notes (Signed)
Patient transported to MRI 

## 2021-08-09 NOTE — Progress Notes (Signed)
PHARMACY -  BRIEF ANTIBIOTIC NOTE   Pharmacy has received consult(s) for Vancomycin & Cefepime from an ED provider.  The patient's profile has been reviewed for ht/wt/allergies/indication/available labs.    One time order(s) placed for Cefepime 2 gm & Vancomycin 1250 per pt wt 61.6 kg  Further antibiotics/pharmacy consults should be ordered by admitting physician if indicated.                       Thank you, Renda Rolls, PharmD, [facility] 08/09/2021 3:01 AM

## 2021-08-09 NOTE — ED Notes (Signed)
Spoke to Pt's son and provided an update.

## 2021-08-09 NOTE — Assessment & Plan Note (Signed)
·   Outpatient management by Dr. Grayland Ormond  Actively receiving weekly cisplatin infusions   Also actively receiving radiation therapy   Continue outpatient follow-up

## 2021-08-09 NOTE — ED Notes (Signed)
Pt fed several bites of lunch and provided fruit cup to feed herself.

## 2021-08-09 NOTE — ED Notes (Signed)
Dr. Shalhoub at bedside  

## 2021-08-09 NOTE — Assessment & Plan Note (Signed)
·   Patient initially presenting with severe hypotension  Etiology unclear.  Patient denies taking any new antihypertensives and denies any recent bouts of diarrhea nausea or vomiting.  While patient does complain of severe left shoulder pain this does not appear to be an infectious process on my exam and therefore I do not believe this is contributing to the hypotension  I believe patient's hypotension and has been so severe that it has been the cause of the patient's transient lactic acidosis   Blood pressure seem to be improving with intravenous fluids  Performing work-up for infection including chest imaging, urinalysis, blood cultures as well as COVID/influenza testing, CRP and procalcitonin  Monitoring for recurrence as we attempt to achieve rate control of the patient's rapid atrial fibrillation

## 2021-08-09 NOTE — ED Provider Notes (Signed)
[facility] Provider Note    Event Date/Time   First MD Initiated Contact with Patient 08/09/21 [phone]     (approximate)   History   Shoulder Pain   HPI  Kristina Ellis is a 71 y.o. female with history of pulmonary hypertension, CAD, atrial fibrillation on chronic anticoagulation, hypertension, bladder cancer who presents to the emergency department with complaints of left shoulder pain.  Unfortunately history is limited as she is extremely poor historian.  It sounds like she has had left shoulder pain for at least several days if not longer.  She told nursing staff that she has a history of "frozen shoulder".  She denies any injury to the shoulder but is crying out with pain with any manipulation of the left arm.  On arrival to the ED patient reports telling staff that she felt like she was going to pass out and was found to be hypotensive with blood pressures in the 01S to 01U systolic.  Denies any chest pain, shortness of breath, cough, abdominal pain, vomiting or diarrhea.  No bloody stools or melena.  On review of her records, it appears that her blood pressure is normally run much higher and have recently been in the 932T to 557D systolic.  It is unclear if she has any changes in her blood pressure medication.  She reports good oral intake.  She denies any fevers or chills.      Physical Exam   Triage Vital Signs: ED Triage Vitals  Enc Vitals Group     BP 08/09/21 0131 (!) 65/44     Pulse Rate 08/09/21 0131 (!) 116     Resp 08/09/21 0131 20     Temp 08/09/21 0131 97.9 F (36.6 C)     Temp Source 08/09/21 0131 Oral     SpO2 08/09/21 0131 99 %     Weight 08/09/21 0127 135 lb 12.9 oz (61.6 kg)     Height 08/09/21 0127 4\' 11"  (1.499 m)     Head Circumference --      Peak Flow --      Pain Score 08/09/21 0127 9     Pain Loc --      Pain Edu? --      Excl. in [place]? --     Most recent vital signs: Vitals:   08/09/21 0730 08/09/21 0800  BP: 133/77  134/75  Pulse: 60 77  Resp: 13 14  Temp:    SpO2: 100% 100%     General: Awake, appears uncomfortable and crying out in pain, hypotensive. CV:  Good peripheral perfusion.  Irregularly irregular and tachycardic. + Murmur. Resp:  Normal effort.  Clear to auscultation bilaterally.  No hypoxia. Abd:  No distention.  Soft and nontender to palpation diffusely. Other:  Extremities warm and well perfused.  No calf tenderness or calf swelling.  She has significant tenderness to palpation throughout the left arm which she states is due to pain in the left shoulder.  There is no deformity noted to the left arm and no redness or warmth.  Her compartments of the left arm are soft.  I do not appreciate a joint effusion.  She is a 2+ left radial pulse and normal capillary refill.   ED Results / Procedures / Treatments   Labs (all labs ordered are listed, but only abnormal results are displayed) Labs Reviewed  CBC WITH DIFFERENTIAL/PLATELET - Abnormal; Notable for the following components:      Result Value  WBC 3.1 (*)    RBC 2.85 (*)    Hemoglobin 10.3 (*)    HCT 29.4 (*)    MCV 103.2 (*)    MCH 36.1 (*)    RDW 18.0 (*)    nRBC 1.0 (*)    All other components within normal limits  COMPREHENSIVE METABOLIC PANEL - Abnormal; Notable for the following components:   Sodium 134 (*)    Potassium 2.7 (*)    Glucose, Bld 148 (*)    Calcium 7.6 (*)    Total Protein 6.4 (*)    Albumin 2.6 (*)    AST 48 (*)    All other components within normal limits  LACTIC ACID, PLASMA - Abnormal; Notable for the following components:   Lactic Acid, Venous 3.7 (*)    All other components within normal limits  URINALYSIS, ROUTINE W REFLEX MICROSCOPIC - Abnormal; Notable for the following components:   Color, Urine YELLOW (*)    APPearance CLEAR (*)    Leukocytes,Ua TRACE (*)    All other components within normal limits  MAGNESIUM - Abnormal; Notable for the following components:   Magnesium 1.2 (*)    All  other components within normal limits  TROPONIN I (HIGH SENSITIVITY) - Abnormal; Notable for the following components:   Troponin I (High Sensitivity) 34 (*)    All other components within normal limits  CULTURE, BLOOD (ROUTINE X 2)  CULTURE, BLOOD (ROUTINE X 2)  RESP PANEL BY RT-PCR (FLU A&B, COVID) ARPGX2  PROCALCITONIN  LACTIC ACID, PLASMA  HIV ANTIBODY (ROUTINE TESTING W REFLEX)  TYPE AND SCREEN  TROPONIN I (HIGH SENSITIVITY)     EKG    EKG Interpretation  Date/Time:  Sunday August 09 2021 01:34:06 EST Ventricular Rate:  101 PR Interval:    QRS Duration: 90 QT Interval:  364 QTC Calculation: 471 R Axis:   -48 Text Interpretation: Atrial fibrillation with rapid ventricular response Left axis deviation Anterior infarct , age undetermined Abnormal ECG When compared with ECG of 03-Jul-2020 15:53, PREVIOUS ECG IS PRESENT No significant change since last tracing Confirmed by Pryor Curia [phone]) on 08/09/2021 5:43:18 AM          RADIOLOGY  Chest x-ray reviewed by myself and radiologist and shows no infiltrate, edema or pneumothorax.  X-rays of the left shoulder and left humerus reviewed by myself and radiologist shows advanced osteoarthritic changes without fracture, dislocation, joint effusion.  PROCEDURES:  Critical Care performed:  YES  CRITICAL CARE Performed by: Pryor Curia   Total critical care time: 65 minutes  Critical care time was exclusive of separately billable procedures and treating other patients.  Critical care was necessary to treat or prevent imminent or life-threatening deterioration.  Critical care was time spent personally by me on the following activities: development of treatment plan with patient and/or surrogate as well as nursing, discussions with consultants, evaluation of patient's response to treatment, examination of patient, obtaining history from patient or surrogate, ordering and performing treatments and interventions, ordering and  review of laboratory studies, ordering and review of radiographic studies, pulse oximetry and re-evaluation of patient's condition.   Marland Kitchen1-3 Lead EKG Interpretation Performed by: Shanikia Kernodle, Delice Bison, DO Authorized by: Ahnesti Townsend, Delice Bison, DO     Interpretation: abnormal     ECG rate:  114   ECG rate assessment: tachycardic     Rhythm: atrial fibrillation     Ectopy: none     Conduction: normal     MEDICATIONS ORDERED IN ED: Medications  diltiazem (CARDIZEM) 125 mg in dextrose 5% 125 mL (1 mg/mL) infusion (5 mg/hr Intravenous New Bag/Given 08/09/21 0358)  lactated ringers infusion ( Intravenous New Bag/Given 08/09/21 0414)  vancomycin (VANCOREADY) IVPB 1250 mg/250 mL (1,250 mg Intravenous New Bag/Given 08/09/21 0830)  apixaban (ELIQUIS) tablet 5 mg (has no administration in time range)  ferrous sulfate tablet 325 mg (has no administration in time range)  acetaminophen (TYLENOL) tablet 650 mg (has no administration in time range)    Or  acetaminophen (TYLENOL) suppository 650 mg (has no administration in time range)  polyethylene glycol (MIRALAX / GLYCOLAX) packet 17 g (has no administration in time range)  ondansetron (ZOFRAN) tablet 4 mg (has no administration in time range)    Or  ondansetron (ZOFRAN) injection 4 mg (has no administration in time range)  sodium chloride 0.9 % bolus 1,000 mL (0 mLs Intravenous Stopped 08/09/21 0232)  fentaNYL (SUBLIMAZE) injection 50 mcg (50 mcg Intravenous Given 08/09/21 0202)  ondansetron (ZOFRAN) injection 4 mg (4 mg Intravenous Given 08/09/21 0201)  lactated ringers bolus 1,000 mL (0 mLs Intravenous Stopped 08/09/21 0501)  ceFEPIme (MAXIPIME) 2 g in sodium chloride 0.9 % 100 mL IVPB (0 g Intravenous Stopped 08/09/21 0436)  metroNIDAZOLE (FLAGYL) IVPB 500 mg (0 mg Intravenous Stopped 08/09/21 0828)  acetaminophen (TYLENOL) tablet 1,000 mg (1,000 mg Oral Given 08/09/21 0402)  potassium chloride SA (KLOR-CON M) CR tablet 40 mEq (40 mEq Oral Given 08/09/21 0402)  potassium  chloride 10 mEq in 100 mL IVPB (0 mEq Intravenous Stopped 08/09/21 0724)  magnesium sulfate IVPB 2 g 50 mL (0 g Intravenous Stopped 08/09/21 0519)     IMPRESSION / MDM / ASSESSMENT AND PLAN / ED COURSE  I reviewed the triage vital signs and the nursing notes.                              Differential diagnosis includes, but is not limited to, cardiogenic shock, septic shock, hypovolemia from dehydration, sepsis, fracture, tendinitis, adhesive capsulitis, septic joint, gout, ACS, PE, dissection, pneumonia, UTI, bacteremia.  The patient is on the cardiac monitor to evaluate for evidence of arrhythmia and/or significant heart rate changes.   Patient here with complaints of left shoulder pain.  It seems like this is chronic in nature but she is an extremely poor historian and repeatedly crying out in pain.  Differential includes adhesive capsulitis, pathologic fracture.  Seems atypical for her anginal equivalent given his reproducible with palpation and movement.  It does not look like a septic joint or gout but will obtain x-rays today.  Arrival to the ED, patient states that she felt near syncopal but never passed out.  She is hypotensive here with systolics in the 40H to 68G which on review of her records is abnormal for her.  Hypotension could be secondary to cardiogenic shock, septic shock, hypovolemia from dehydration.  She denies any GI losses.  Will give IV fluids and broad-spectrum antibiotics for possible sepsis.  Her rectal temperature here is 99.7.  She would be high risk for infection given she has history of cancer and per her records she last received chemotherapy of cisplatin on 08/04/2021.  Her EKG was obtained and reviewed by myself and shows atrial fibrillation with RVR but no ischemia.  Symptoms could be cardiogenic in nature.  Will obtain cardiac labs.  Will monitor closely to see if she is fluid responsive.  If not, patient may need emergent cardioversion to see  if this helps with  her hypotension.    Patient's x-rays of her left upper extremity were reviewed by myself and radiologist and show significant arthritic changes but no other acute abnormality.  Chest x-ray reviewed by myself and radiologist and shows no infiltrate, edema or pneumothorax.  First troponin is negative.  Her blood pressures have been fluid responsive and we were able to start IV diltiazem for atrial fibrillation with RVR.  She is hypokalemic here without other EKG changes.  Will give oral and IV replacement.  She is also hypomagnesemic but this appears to be chronic based on her previous records.  We will give IV magnesium.  She did have 1 episode of diarrhea here in the emergency department but denies any other GI losses.  No bloody stools or melena.  Her hemoglobin is stable compared to previous.  She is leukopenic but not neutropenic today.  COVID and flu swabs negative.  Urine shows no sign of infection.  No ketones to suggest dehydration.  Procalcitonin is normal but lactic is significantly elevated.  We will repeat after IV fluid boluses.  Repeat lactate has come down after IV fluids.  Second troponin has gone up from 8 down to 34 but again she denies chest pain or shortness of breath and this seems atypical for an anginal equivalent but may be rate related due to her A. fib with RVR.   4:10 AM  Discussed patient's case with hospitalist, Dr. Cyd Silence.  I have recommended admission and patient (and family if present) agree with this plan. Admitting physician will place admission orders.   I reviewed all nursing notes, vitals, pertinent previous records and reviewed/interpreted all EKGs, lab and urine results, imaging (as available).   FINAL CLINICAL IMPRESSION(S) / ED DIAGNOSES   Final diagnoses:  Hypotension, unspecified hypotension type  Atrial fibrillation with RVR (HCC)  Hypokalemia  Hypomagnesemia  Elevated lactic acid level  Acute pain of left shoulder     Rx / DC Orders    ED Discharge Orders     None        Note:  This document was prepared using Dragon voice recognition software and may include unintentional dictation errors.   Tyshon Fanning, Delice Bison, DO 08/09/21 [phone]

## 2021-08-09 NOTE — Assessment & Plan Note (Signed)
·   Patient presenting with substantial hypokalemia  Patient denies any diuretic use or any recent poor oral intake or gastrointestinal losses  I therefore believe the hypokalemia is likely a product of the severe hypomagnesemia  Replenishing with 4 g of intravenous magnesium sulfate as well as intravenous and oral potassium chloride

## 2021-08-09 NOTE — Progress Notes (Signed)
CODE SEPSIS - PHARMACY COMMUNICATION  **Broad Spectrum Antibiotics should be administered within 1 hour of Sepsis diagnosis**  Time Code Sepsis Called/Page Received: 0251  Antibiotics Ordered: Cefepime, Vancomycin, & Flagl  Time of 1st antibiotic administration: 0406  Renda Rolls, PharmD, [facility] 08/09/2021 2:59 AM

## 2021-08-10 ENCOUNTER — Inpatient Hospital Stay
Admit: 2021-08-10 | Discharge: 2021-08-10 | Disposition: A | Discharge status: Home / Self Care | Payer: Medicare Other | Attending: Internal Medicine | Admitting: Internal Medicine

## 2021-08-10 LAB — ECHOCARDIOGRAM COMPLETE
AR max vel: 1.93 cm2
AV Area VTI: 2.14 cm2
AV Area mean vel: 2.07 cm2
AV Mean grad: 4.3 mmHg
AV Peak grad: 8.8 mmHg
Ao pk vel: 1.49 m/s
Area-P 1/2: 2.51 cm2
Height: 59 in
MV VTI: 1.58 cm2
S' Lateral: 2.8 cm
Weight: 2172.85 oz

## 2021-08-10 LAB — COMPREHENSIVE METABOLIC PANEL
ALT: 23 U/L (ref 0–44)
AST: 35 U/L (ref 15–41)
Albumin: 2.2 g/dL — ABNORMAL LOW (ref 3.5–5.0)
Alkaline Phosphatase: 43 U/L (ref 38–126)
Anion gap: 7 (ref 5–15)
BUN: 11 mg/dL (ref 8–23)
CO2: 24 mmol/L (ref 22–32)
Calcium: 7.4 mg/dL — ABNORMAL LOW (ref 8.9–10.3)
Chloride: 104 mmol/L (ref 98–111)
Creatinine, Ser: 0.55 mg/dL (ref 0.44–1.00)
GFR, Estimated: >60 mL/min (ref >60)
Glucose, Bld: 85 mg/dL (ref 70–99)
Potassium: 3.3 mmol/L — ABNORMAL LOW (ref 3.5–5.1)
Sodium: 135 mmol/L (ref 135–145)
Total Bilirubin: 1.5 mg/dL — ABNORMAL HIGH (ref 0.3–1.2)
Total Protein: 5.8 g/dL — ABNORMAL LOW (ref 6.5–8.1)

## 2021-08-10 LAB — CBC WITH DIFFERENTIAL/PLATELET
Abs Immature Granulocytes: 0.03 10*3/uL (ref 0.00–0.07)
Basophils Absolute: 0 10*3/uL (ref 0.0–0.1)
Basophils Relative: 0 %
Eosinophils Absolute: 0 10*3/uL (ref 0.0–0.5)
Eosinophils Relative: 1 %
HCT: 27.5 % — ABNORMAL LOW (ref 36.0–46.0)
Hemoglobin: 9.5 g/dL — ABNORMAL LOW (ref 12.0–15.0)
Immature Granulocytes: 1 %
Lymphocytes Relative: 16 %
Lymphs Abs: 0.5 10*3/uL — ABNORMAL LOW (ref 0.7–4.0)
MCH: 35.3 pg — ABNORMAL HIGH (ref 26.0–34.0)
MCHC: 34.5 g/dL (ref 30.0–36.0)
MCV: 102.2 fL — ABNORMAL HIGH (ref 80.0–100.0)
Monocytes Absolute: 0.4 10*3/uL (ref 0.1–1.0)
Monocytes Relative: 14 %
Neutro Abs: 2.1 10*3/uL (ref 1.7–7.7)
Neutrophils Relative %: 68 %
Platelets: 167 10*3/uL (ref 150–400)
RBC: 2.69 MIL/uL — ABNORMAL LOW (ref 3.87–5.11)
RDW: 18.7 % — ABNORMAL HIGH (ref 11.5–15.5)
WBC: 3.1 10*3/uL — ABNORMAL LOW (ref 4.0–10.5)
nRBC: 0 % (ref 0.0–0.2)

## 2021-08-10 LAB — MAGNESIUM: Magnesium: 1.4 mg/dL — ABNORMAL LOW (ref 1.7–2.4)

## 2021-08-10 MED ORDER — PREDNISONE 10 MG PO TABS
ORAL_TABLET | ORAL | 0 refills | Status: AC
Start: 2021-08-10 — End: 2021-08-22

## 2021-08-10 MED ORDER — DICLOFENAC SODIUM 50 MG PO TBEC: 50.0000 mg | DELAYED_RELEASE_TABLET | Freq: Three times a day (TID) | ORAL | 0 refills | Status: DC

## 2021-08-10 MED ORDER — METOPROLOL SUCCINATE ER 25 MG PO TB24: 25.0000 mg | ORAL_TABLET | Freq: Every day | ORAL | 0 refills | Status: DC

## 2021-08-10 MED ORDER — METHYLPREDNISOLONE SODIUM SUCC 125 MG IJ SOLR: 125.0000 mg | Freq: Once | INTRAMUSCULAR | Status: DC

## 2021-08-10 MED ORDER — OXYCODONE-ACETAMINOPHEN 5-325 MG PO TABS: 1.0000 | ORAL_TABLET | Freq: Four times a day (QID) | ORAL | 0 refills | Status: DC | PRN

## 2021-08-10 NOTE — Progress Notes (Signed)
*  PRELIMINARY RESULTS* Echocardiogram 2D Echocardiogram has been performed.  Kristina Ellis 08/10/2021, 10:53 AM

## 2021-08-10 NOTE — ED Notes (Signed)
Pt brother came to front desk earlier and was told that she was an admission and not being d/c.

## 2021-08-10 NOTE — ED Notes (Signed)
This RN left message on brother Mark's phone at [phone] explaining that pt is indeed up for d/c.  Also called mother's number [phone] and spoke with niece Cathi Roan who stated that she would get a hold of someone and if not she would come get pt herself. Pt updated on situation at this time.

## 2021-08-10 NOTE — ED Notes (Signed)
This RN called the phone number listed for pt brother Elta Guadeloupe who is supposed to be on his way to pick her up.  Woman answered and said he left his phone there but is on his way.

## 2021-08-10 NOTE — Consult Note (Signed)
ORTHOPAEDIC CONSULTATION  REQUESTING PHYSICIAN: Little Ishikawa, MD  Chief Complaint: Left shoulder pain  HPI: Kristina Ellis is a 71 y.o. female who complains of left shoulder pain.  Patient reports a history of fall and left shoulder dislocation in 1610 which was complicated by brachial plexus injury.  She states she has not fully recovered from this injury and has had intermittent persisting pain and weakness in the shoulder and occasional numbness and tingling in the arm.  Her left shoulder symptoms have been worse over the last few days.  Patient is right-hand dominant, however her right shoulder also has chronic intermittent pain and weakness.  Past medical history is notable for A. fib on Eliquis, hypertension, CAD, HCC on chemotherapy.    Past Medical History:  Diagnosis Date   Aortic atherosclerosis ([place])    Arthritis    Atrial fibrillation ([place])    a.) s/p TEE with cardioversion on 02/04/2020. b.) on daily apixaban.   Bladder mass    a.) CT 04/08/2021 --> 3.2 cm intraluminal bladder mass.   Carotid stenosis, bilateral    a.) Doppler 07/04/2020 --> mild; 1-49% stenosis BILATERALLY.   Cholelithiasis    a.) CT 11/06/2020 --> largest measured 4.5 cm   Chronic anticoagulation    a.) Apixaban   Common biliary duct calculus    a.) CT 04/08/2021 --> CBD dilated at 8 mm; 47mm calculus within distal CBD.   Coronary artery disease involving native coronary artery of native heart without angina pectoris 9/60/4540   Diastolic dysfunction    a.) TTE 07/04/2020 --> LVEF 60-65%; G1DD.   Hepatic cirrhosis (HCC)    Hepatic steatosis    Hepatitis 1970   History of 2019 novel coronavirus disease (COVID-19) 12/20/2019   History of marijuana use    Hypertension    Hypertensive retinopathy    IDA (iron deficiency anemia)    Mitral stenosis    a.) TEE 04/10/2020 --> mild. b.) TTE 07/04/2020 --> EF 60-65%; mild (mean gradient 5 mmHg).   Moderate pulmonary arterial systolic  hypertension ([place]) 12/22/2019   Nephrolithiasis    NSTEMI (non-ST elevated myocardial infarction) ([place]) 07/26/2016   Open-angle glaucoma 09/11/2010   PAH (pulmonary artery hypertension) ([place])    a.) TTE 12/21/2019 --> PASP 44 mmHg.   Uterine fibroid    a.) CT 04/08/2021 --> multiple with largest measuring 7 cm.   Valvular regurgitation    a.) TTE 09/01/2010 --> trivial to mild pan-valvular. b.) TTE 12/21/2019 --> mild MR and AR; moderate TR. c.) TTE 07/04/2020 --> mild TR; trivial MR and PR.   Vestibular neuronitis    Past Surgical History:  Procedure Laterality Date   BREAST CYST ASPIRATION Left    COLONOSCOPY  2012   TEE WITH CARDIOVERSION N/A 02/04/2020   Procedure: TEE WITH CARDIOVERSION; Location: [facility]; Surgeon: Kandis Cocking, MD   TRANSURETHRAL RESECTION OF BLADDER TUMOR N/A 05/15/2021   Procedure: TRANSURETHRAL RESECTION OF BLADDER TUMOR (TURBT);  Surgeon: Billey Co, MD;  Location: [facility];  Service: Urology;  Laterality: N/A;   Social History   Socioeconomic History   Marital status: Single    Spouse name: Not on file   Number of children: 1   Years of education: Not on file   Highest education level: Not on file  Occupational History   Not on file  Tobacco Use   Smoking status: Former    Packs/day: 0.10    Years: 10.00    Pack years: 1.00    Types: Cigarettes  Smokeless tobacco: Never   Tobacco comments:    occasional smoker  Vaping Use   Vaping Use: Never used  Substance and Sexual Activity   Alcohol use: Yes    Alcohol/week: 5.0 standard drinks    Types: 5 Cans of beer per week    Comment: weekly   Drug use: Not Currently    Types: 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, 70's   Sexual activity: Not on file  Other Topics Concern   Not on file  Social History Narrative   Lives with son   Social Determinants of Health   Financial Resource Strain: Not on file  Food Insecurity: Not on file  Transportation Needs: Not on file  Physical Activity: Not on  file  Stress: Not on file  Social Connections: Not on file   Family History  Problem Relation Age of Onset   Aneurysm Mother    Colon cancer Father    Lung cancer Brother    Breast cancer Neg Hx    Allergies  Allergen Reactions   Atenolol Other (See Comments)    bradycardia bradycardia    Penicillins Rash   Prior to Admission medications   Medication Sig Start Date End Date Taking? Authorizing Provider  amLODipine (NORVASC) 10 MG tablet Take 10 mg by mouth daily. 04/06/21  Yes [provider]  ELIQUIS 5 MG TABS tablet Take 5 mg by mouth 2 (two) times daily. 05/13/20  Yes [provider]  FEROSUL 325 (65 Fe) MG tablet Take 325 mg by mouth daily. 05/10/20  Yes [provider]  magnesium oxide (MAG-OX) 400 MG tablet Take 1 tablet by mouth 2 (two) times daily. 03/24/20  Yes [provider]  metoprolol succinate (TOPROL-XL) 100 MG 24 hr tablet Take 1 tablet (100 mg total) by mouth daily. Take with or immediately following a meal. 07/05/20  Yes Nita Sells, MD  prochlorperazine (COMPAZINE) 10 MG tablet Take 1 tablet (10 mg total) by mouth every 6 (six) hours as needed (Nausea or vomiting). 06/05/21  Yes Lloyd Huger, MD  trolamine salicylate (ASPERCREME) 10 % cream Apply 1 application topically as needed for muscle pain.   Yes [provider]  ondansetron (ZOFRAN) 8 MG tablet Take 1 tablet (8 mg total) by mouth 2 (two) times daily as needed. Start on the third day after cisplatin chemotherapy. 06/05/21   Lloyd Huger, MD   MR SHOULDER LEFT W WO CONTRAST  Result Date: 08/09/2021 CLINICAL DATA:  Left shoulder pain. EXAM: MRI OF THE LEFT SHOULDER WITHOUT AND WITH CONTRAST TECHNIQUE: Multiplanar, multisequence MR imaging of the left shoulder was performed before and after the administration of intravenous contrast. CONTRAST:  7.23mL GADAVIST GADOBUTROL 1 MMOL/ML IV SOLN COMPARISON:  Radiographs 08/09/2021 FINDINGS: Rotator cuff:  Significant rotator cuff tendinopathy/tendinosis with interstitial tears and a shallow partial-thickness articular surface tear near the supraspinatus attachment site. No definite full-thickness retracted tear is identified. Muscles: No acute muscle injury but there is moderate diffuse fatty atrophy of the shoulder musculature. Biceps long head:  Intact Acromioclavicular Joint: Mild degenerative changes. The undersurface of the acromion is very irregular and I suspect the patient has had prior arthroscopic acromioplasty. Os acromiale also suspected. Type 2 acromion. No lateral downsloping or subacromial spurring. Glenohumeral Joint: Advanced degenerative changes with findings highly suggestive of an inflammatory/erosive arthropathy with a large joint effusion and extensive synovitis. PVNS is possible. Labrum:  Degenerated and torn. Bones:  No acute bony findings. Other: Extensive subacromial/subdeltoid bursitis. IMPRESSION: 1.  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 PVNS is possible. 2. Significant rotator cuff tendinopathy/tendinosis with interstitial tears and a shallow partial-thickness articular surface tear near the supraspinatus attachment site. No full-thickness retracted tear. 3. Intact long head biceps tendon. 4. Degenerated and torn glenoid labrum. 5. Extensive subacromial/subdeltoid bursitis. Electronically Signed   By: Marijo Sanes M.D.   On: 08/09/2021 12:42   DG Chest Portable 1 View  Result Date: 08/09/2021 CLINICAL DATA:  Initial evaluation for hypotension, near syncope. EXAM: PORTABLE CHEST 1 VIEW COMPARISON:  Radiograph from 07/03/2020. FINDINGS: Transverse heart size within normal limits. Mediastinal silhouette stable, and remains within normal limits. Aortic atherosclerosis. Heterogeneous calcification along the left lateral margin of the lower thoracic vertebral column noted, likely degenerative and/or  reflecting costochondral calcifications. Lungs mildly hypoinflated. No focal infiltrates. No edema or effusion. No pneumothorax. No acute osseous finding. 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 IMPRESSION: 1. No active cardiopulmonary disease. 2.  Aortic Atherosclerosis (ICD10-I70.0). Electronically Signed   By: Jeannine Boga M.D.   On: 08/09/2021 03:10   DG Shoulder Left  Result Date: 08/09/2021 CLINICAL DATA:  Initial evaluation for acute left shoulder pain. EXAM: LEFT SHOULDER - 2+ VIEW COMPARISON:  None. FINDINGS: No acute fracture or dislocation. Advanced osteoarthritic changes present about the left glenohumeral articulation. Chronic Hill-Sachs deformity at the left humeral head consistent with prior dislocation. No visible soft tissue abnormality. Visualized left hemithorax largely clear. IMPRESSION: 1. No acute osseous abnormality about the left shoulder. 2. 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 3. Chronic Hill-Sachs deformity at the left humeral head, consistent with prior dislocation. Electronically Signed   By: Jeannine Boga M.D.   On: 08/09/2021 03:06   DG Humerus Left  Result Date: 08/09/2021 CLINICAL DATA:  Initial evaluation for acute left arm pain. EXAM: LEFT HUMERUS - 2+ VIEW COMPARISON:  Concomitant radiograph of the left shoulder. FINDINGS: No acute fracture or dislocation. Advanced osteoarthritic changes about the left shoulder, better evaluated on concomitant radiograph of the left shoulder. Osteoarthritic changes noted about the elbow as well. No visible soft tissue abnormality. IMPRESSION: 1. No acute osseous abnormality about the left humerus. 2. 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 Electronically Signed   By: Jeannine Boga M.D.   On: 08/09/2021 03:07    Positive ROS: All other systems have been reviewed and were otherwise  negative with the exception of those mentioned in the HPI and as above.  Physical Exam: General: Alert, no acute distress Cardiovascular: No pedal edema Respiratory: No cyanosis, no use of accessory musculature GI: No organomegaly, abdomen is soft and non-tender Skin: No lesions in the area of chief complaint Neurologic: Sensation intact distally Psychiatric: Patient is competent for consent with normal mood and affect Lymphatic: No axillary or cervical lymphadenopathy  MUSCULOSKELETAL:  Left upper extremity: Mild diffuse tenderness about the left shoulder, limited active range of motion of the shoulder secondary to pain, passive range of motion: Tolerates external rotation to 30 degrees, forward flexion to 90 degrees limited by pain.  Able to actively flex and extend at the elbow and wrist, grossly sensory intact throughout the left upper extremity   Assessment: 71 year old right-hand-dominant female with A. fib on Eliquis, CAD, HCC on chemotherapy admitted for hypotension, lactic acidosis, and A. fib and RVR, also with left shoulder pain likely posttraumatic arthritis versus inflammatory arthropathy  Plan: - Recommend oral medication as tolerated, prednisone  versus narcotics as patient is unable to take NSAIDs - Recommend x-ray guided glenohumeral steroid injection with radiology if patient's pain persists - Patient can follow-up in 2 to 3 weeks in clinic for further evaluation.  We discussed her x-ray and MRI findings in detail.  Recommend continued conservative management at this time.  Patient has multiple medical comorbidities which would need to be optimized, however if her left shoulder pain and symptoms continue to persist, she could be a future candidate for left shoulder arthroplasty.    Renee Harder, MD    08/10/2021 10:14 AM

## 2021-08-10 NOTE — Evaluation (Signed)
Physical Therapy Evaluation Patient Details Name: Kristina Ellis MRN: [id number] DOB: 1950-09-23 Today's Date: 08/10/2021  History of Present Illness  presented to ER secondary to L shoulder pain, weakness and lightheadedness; admitted for management of hypotension, afib with RVR, hypokalemia and L shoulder pain (pending steroid injection per notes)  Clinical Impression  Patient resting in bed upon arrival to room; alert and oriented, follows commands and agreeable to participation with session.  Endorses persistent pain to L shoulder; tolerating very minimal ROM (approx 65-70 degrees of active elvation) and WBing to area.   Currently requiring cga/min assist for bed mobility (requires transition towards R); cga/min assist for sit/stand and short-distance gait with R UE HHA from therapist.  Demonstrates choppy stepping pattern; limited stance time L LE (patient hesitant to bear weight due to weakness, limited OOB mobility since admission); consistent min assist from therapist.  Ideally, would benefit from use of RW, but unable to tolerate WBing through L UE at this time. Of note, HR elevation to 159 with short-distance gait efforts during session; asymptomatic and recovers to baseline (low-100s) with return to supine.  RN informed/aware. Would benefit from skilled PT to address above deficits and promote optimal return to PLOF; Recommend transition to [place] upon discharge from acute hospitalization.       Recommendations for follow up therapy are one component of a multi-disciplinary discharge planning process, led by the attending physician.  Recommendations may be updated based on patient status, additional functional criteria and insurance authorization.  Follow Up Recommendations Home health PT    Assistance Recommended at Discharge Intermittent Supervision/Assistance  Functional Status Assessment Patient has had a recent decline in their functional status and demonstrates the ability to make  significant improvements in function in a reasonable and predictable amount of time.  Equipment Recommendations       Recommendations for Other Services       Precautions / Restrictions Precautions Precautions: Fall Restrictions Weight Bearing Restrictions: No      Mobility  Bed Mobility Overal bed mobility: Needs Assistance Bed Mobility: Supine to Sit     Supine to sit: Min guard     General bed mobility comments: transition towards R to minimize movement/WBing L shoulder    Transfers Overall transfer level: Needs assistance Equipment used: Rolling walker (2 wheels) Transfers: Sit to/from Stand Sit to Stand: Min assist           General transfer comment: R UE support    Ambulation/Gait Ambulation/Gait assistance: Min assist Gait Distance (Feet): 30 Feet Assistive device: 1 person hand held assist         General Gait Details: choppy stepping pattern; limited stance time L LE (patient hesitant to bear weight due to weakness, limited OOB mobility since admission); consistent min assist from therapist.  Ideally, would benefit from use of RW, but unable to tolerate WBing through L UE at this time.  Stairs            Wheelchair Mobility    Modified Rankin (Stroke Patients Only)       Balance Overall balance assessment: Needs assistance Sitting-balance support: No upper extremity supported;Feet supported Sitting balance-Leahy Scale: Good     Standing balance support: Single extremity supported Standing balance-Leahy Scale: Fair                               Pertinent Vitals/Pain Pain Assessment: Faces Faces Pain Scale: Hurts whole lot Pain Location: L  shoulder Pain Descriptors / Indicators: Aching;Grimacing;Guarding Pain Intervention(s): Limited activity within patient's tolerance;Monitored during session;Repositioned    Home Living Family/patient expects to be discharged to:: Private residence Living Arrangements:  Children Available Help at Discharge: Family;Available PRN/intermittently Type of Home: House Home Access: Level entry       Home Layout: One level Home Equipment: Cane - single [employer]ista (2 wheels)      Prior Function Prior Level of Function : Needs assist             Mobility Comments: Mod indep with household mobilization, use of SPC as needed; does utilize RW (or Therapist, music) for longer distances/with appointments. Denies recent fall history ADLs Comments: Son assists with household chores, driving and errands as needed     Hand Dominance   Dominant Hand: Right    Extremity/Trunk Assessment   Upper Extremity Assessment Upper Extremity Assessment:  (L shoulder elevation limited approx 65-70 degrees, all planes; limited by pain. Very guarded with all movement.)    Lower Extremity Assessment Lower Extremity Assessment:  (grossly 4-/5 throughout; significant (baseline) genu varus to L knee)       Communication      Cognition Arousal/Alertness: Awake/alert Behavior During Therapy: WFL for tasks assessed/performed Overall Cognitive Status: Within Functional Limits for tasks assessed                                          General Comments      Exercises Other Exercises Other Exercises: Reviewed role of PT and progressive mobility; orthostatic assessed; reviewed importance of L UE ROM as tolerated.  Will reinforce all throughout stay.   Assessment/Plan    PT Assessment Patient needs continued PT services  PT Problem List Decreased strength;Decreased range of motion;Decreased activity tolerance;Decreased balance;Decreased mobility;Decreased knowledge of use of DME;Decreased safety awareness;Decreased knowledge of precautions;Cardiopulmonary status limiting activity       PT Treatment Interventions      PT Goals (Current goals can be found in the Care Plan section)  Acute Rehab PT Goals Patient Stated Goal: to make this  pain better PT Goal Formulation: With patient Time For Goal Achievement: 08/24/21 Potential to Achieve Goals: Good    Frequency Min 2X/week   Barriers to discharge        Co-evaluation               AM-PAC PT "6 Clicks" Mobility  Outcome Measure Help needed turning from your back to your side while in a flat bed without using bedrails?: None Help needed moving from lying on your back to sitting on the side of a flat bed without using bedrails?: A Little Help needed moving to and from a bed to a chair (including a wheelchair)?: A Little Help needed standing up from a chair using your arms (e.g., wheelchair or bedside chair)?: A Little Help needed to walk in hospital room?: A Little Help needed climbing 3-5 steps with a railing? : A Lot 6 Click Score: 18    End of Session Equipment Utilized During Treatment: Gait belt Activity Tolerance: Patient tolerated treatment well;Treatment limited secondary to medical complications (Comment) (HR elevation with limited mobility) Patient left: in bed;with call bell/phone within reach (tech at bedside for ECHO) Nurse Communication: Mobility status PT Visit Diagnosis: Muscle weakness (generalized) (M62.81);Difficulty in walking, not elsewhere classified (R26.2)    Time: 3086-5784 PT Time Calculation (min) (ACUTE ONLY):  23 min   Charges:   PT Evaluation $PT Eval Moderate Complexity: 1 Mod PT Treatments $Therapeutic Activity: 8-22 mins       Ahmiyah Coil H. Owens Shark, PT, DPT, NCS 08/10/21, 11:53 AM [phone]

## 2021-08-10 NOTE — Discharge Summary (Signed)
Physician Discharge Summary  XELA OREGEL HKV:[id number] DOB: 08-08-1951 DOA: 08/09/2021  PCP: [facility] [place] date: 08/09/2021 Discharge date: 08/10/2021  Admitted From: Home Disposition: Home with home health  Recommendations for Outpatient Follow-up:  Follow up with PCP in 1-2 weeks Please obtain BMP/CBC in one week Please follow up with orthopedic surgery as discussed  Home Health: Yes Equipment/Devices: None  Discharge Condition: Stable CODE STATUS: Full Diet recommendation: As tolerated  Brief/Interim Summary: 71 year old female presenting from home with past medical history of hypertension, atrial fibrillation (on Eliquis, S/P cardioversion 01/2020), coronary artery disease (prior NSTEMI) who presents to [facility] emergency department with complaints of acute on chronic left shoulder pain.  Patient initially found to be markedly hypotensive at 65/54 with lactic acidosis, hypokalemia in the setting of profoundly poor p.o. intake secondary to pain.  Patient's symptoms improved with p.o. narcotics, topical analgesics, will offer steroid dose and taper today per orthopedic recommendations with close outpatient follow-up in their office for further discussion on how to move forward given her profoundly abnormal MRI.  Patient's hypotension lactic acidosis otherwise cleared with IV fluids, patient admits to extremely poor p.o. intake, no signs or symptoms of infection, patient did not meet sepsis criteria during hospital stay.   Assessment & Plan:   Profound hypotension lactic acidosis, unclear etiology SIRS criteria met without signs or symptoms of infection, does not meet sepsis criteria -Resolved with IV fluids, hold home antihypertensive medications until follow-up with PCP -continue low-dose metoprolol succinate 25(previously on 100)   Acute intractable shoulder pain -Orthopedic consulted -recommending outpatient follow-up in their office -MRI grossly  abnormal-shows advanced inflammatory/erosive arthropathy with large joint effusion and extensive synovitis with significant rotator cuff inflammation  -Continue steroid taper and oxycodone   A. fib with RVR, resolving -RVR provoked in the setting of intractable pain, now resolving continue metoprolol at lower dose given hypotension at intake   Discharge Instructions   Allergies as of 08/10/2021       Reactions   Atenolol Other (See Comments)   bradycardia bradycardia   Penicillins Rash        Medication List     STOP taking these medications    amLODipine 10 MG tablet Commonly known as: NORVASC       TAKE these medications    diclofenac 50 MG EC tablet Commonly known as: VOLTAREN Take 1 tablet (50 mg total) by mouth 3 (three) times daily.   Eliquis 5 MG Tabs tablet Generic drug: apixaban Take 5 mg by mouth 2 (two) times daily.   FeroSul 325 (65 FE) MG tablet Generic drug: ferrous sulfate Take 325 mg by mouth daily.   magnesium oxide 400 MG tablet Commonly known as: MAG-OX Take 1 tablet by mouth 2 (two) times daily.   metoprolol succinate 25 MG 24 hr tablet Commonly known as: TOPROL-XL Take 1 tablet (25 mg total) by mouth daily. Take with or immediately following a meal. What changed:  medication strength how much to take   ondansetron 8 MG tablet Commonly known as: Zofran Take 1 tablet (8 mg total) by mouth 2 (two) times daily as needed. Start on the third day after cisplatin chemotherapy.   oxyCODONE-acetaminophen 5-325 MG tablet Commonly known as: PERCOCET/ROXICET Take 1 tablet by mouth every 6 (six) hours as needed for moderate pain or severe pain.   predniSONE 10 MG tablet Commonly known as: DELTASONE Take 4 tablets (40 mg total) by mouth daily for 3 days, THEN 3 tablets (30 mg  total) daily for 3 days, THEN 2 tablets (20 mg total) daily for 3 days, THEN 1 tablet (10 mg total) daily for 3 days. Start taking on: August 10, 2021   prochlorperazine  10 MG tablet Commonly known as: COMPAZINE Take 1 tablet (10 mg total) by mouth every 6 (six) hours as needed (Nausea or vomiting).   trolamine salicylate 10 % cream Commonly known as: ASPERCREME Apply 1 application topically as needed for muscle pain.        Allergies  Allergen Reactions   Atenolol Other (See Comments)    bradycardia bradycardia    Penicillins Rash    Consultations: Orthopedic surgery  Procedures/Studies: MR SHOULDER LEFT W WO CONTRAST  Result Date: 08/09/2021 CLINICAL DATA:  Left shoulder pain. EXAM: MRI OF THE LEFT SHOULDER WITHOUT AND WITH CONTRAST TECHNIQUE: Multiplanar, multisequence MR imaging of the left shoulder was performed before and after the administration of intravenous contrast. CONTRAST:  7.64m GADAVIST GADOBUTROL 1 MMOL/ML IV SOLN COMPARISON:  Radiographs 08/09/2021 FINDINGS: Rotator cuff: Significant rotator cuff tendinopathy/tendinosis with interstitial tears and a shallow partial-thickness articular surface tear near the supraspinatus attachment site. No definite full-thickness retracted tear is identified. Muscles: No acute muscle injury but there is moderate diffuse fatty atrophy of the shoulder musculature. Biceps long head:  Intact Acromioclavicular Joint: Mild degenerative changes. The undersurface of the acromion is very irregular and I suspect the patient has had prior arthroscopic acromioplasty. Os acromiale also suspected. Type 2 acromion. No lateral downsloping or subacromial spurring. Glenohumeral Joint: Advanced degenerative changes with findings highly suggestive of an inflammatory/erosive arthropathy with a large joint effusion and extensive synovitis. PVNS is possible. Labrum:  Degenerated and torn. Bones:  No acute bony findings. Other: Extensive subacromial/subdeltoid bursitis. IMPRESSION: 1. 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 PVNS is possible.  2. Significant rotator cuff tendinopathy/tendinosis with interstitial tears and a shallow partial-thickness articular surface tear near the supraspinatus attachment site. No full-thickness retracted tear. 3. Intact long head biceps tendon. 4. Degenerated and torn glenoid labrum. 5. Extensive subacromial/subdeltoid bursitis. Electronically Signed   By: PMarijo SanesM.D.   On: 08/09/2021 12:42   DG Chest Portable 1 View  Result Date: 08/09/2021 CLINICAL DATA:  Initial evaluation for hypotension, near syncope. EXAM: PORTABLE CHEST 1 VIEW COMPARISON:  Radiograph from 07/03/2020. FINDINGS: Transverse heart size within normal limits. Mediastinal silhouette stable, and remains within normal limits. Aortic atherosclerosis. Heterogeneous calcification along the left lateral margin of the lower thoracic vertebral column noted, likely degenerative and/or reflecting costochondral calcifications. Lungs mildly hypoinflated. No focal infiltrates. No edema or effusion. No pneumothorax. No acute osseous finding. 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 IMPRESSION: 1. No active cardiopulmonary disease. 2.  Aortic Atherosclerosis (ICD10-I70.0). Electronically Signed   By: BJeannine BogaM.D.   On: 08/09/2021 03:10   DG Shoulder Left  Result Date: 08/09/2021 CLINICAL DATA:  Initial evaluation for acute left shoulder pain. EXAM: LEFT SHOULDER - 2+ VIEW COMPARISON:  None. FINDINGS: No acute fracture or dislocation. Advanced osteoarthritic changes present about the left glenohumeral articulation. Chronic Hill-Sachs deformity at the left humeral head consistent with prior dislocation. No visible soft tissue abnormality. Visualized left hemithorax largely clear. IMPRESSION: 1. No acute osseous abnormality about the left shoulder. 2. 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 3. Chronic Hill-Sachs deformity at the left humeral head, consistent with prior dislocation.  Electronically Signed   By: BJeannine BogaM.D.   On: 08/09/2021 03:06  DG Humerus Left  Result Date: 08/09/2021 CLINICAL DATA:  Initial evaluation for acute left arm pain. EXAM: LEFT HUMERUS - 2+ VIEW COMPARISON:  Concomitant radiograph of the left shoulder. FINDINGS: No acute fracture or dislocation. Advanced osteoarthritic changes about the left shoulder, better evaluated on concomitant radiograph of the left shoulder. Osteoarthritic changes noted about the elbow as well. No visible soft tissue abnormality. IMPRESSION: 1. No acute osseous abnormality about the left humerus. 2. 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 Electronically Signed   By: Jeannine Boga M.D.   On: 08/09/2021 03:07   ECHOCARDIOGRAM COMPLETE  Result Date: 08/10/2021    ECHOCARDIOGRAM REPORT   Patient Name:   Kristina Ellis Date of Exam: 08/10/2021 Medical Rec #:  [id number]        Height:       59.0 in Accession #:    [id number]       Weight:       135.8 lb Date of Birth:  [birth year]         BSA:          1.565 m Patient Age:    43 years         BP:           107/95 mmHg Patient Gender: F                HR:           61 bpm. Exam Location:  [facility] Procedure: 2D Echo, Cardiac Doppler and Color Doppler Indications:     Atrial Fibrillation I48.91  History:         Patient has prior history of Echocardiogram examinations, most                  recent 07/04/2021. CAD, Arrythmias:Atrial Fibrillation; Risk                  Factors:Hypertension.  Sonographer:     Sherrie Sport Referring Phys:  [id number] [place] Diagnosing Phys: Donnelly Angelica  Sonographer Comments: Suboptimal apical window. IMPRESSIONS  1. Left ventricular ejection fraction, by estimation, is 60 to 65%. The left ventricle has normal function. The left ventricle has no regional wall motion abnormalities. Left ventricular diastolic parameters are indeterminate.  2. Right ventricular systolic  function was not well visualized. The right ventricular size is not well visualized.  3. Left atrial size was severely dilated.  4. The mitral valve is degenerative. Mild mitral valve regurgitation. Mild mitral stenosis. Severe mitral annular calcification.  5. The aortic valve is calcified. Aortic valve regurgitation is not visualized. Aortic valve sclerosis is present, with no evidence of aortic valve stenosis. FINDINGS  Left Ventricle: Left ventricular ejection fraction, by estimation, is 60 to 65%. The left ventricle has normal function. The left ventricle has no regional wall motion abnormalities. The left ventricular internal cavity size was normal in size. There is  no left ventricular hypertrophy. Left ventricular diastolic parameters are indeterminate. Right Ventricle: The right ventricular size is not well visualized. Right vetricular wall thickness was not well visualized. Right ventricular systolic function was not well visualized. Left Atrium: Left atrial size was severely dilated. Right Atrium: Right atrial size was not well visualized. Pericardium: There is no evidence of pericardial effusion. Mitral Valve: The mitral valve is degenerative in appearance. Severe mitral annular calcification. Mild mitral valve regurgitation. Mild mitral valve stenosis. MV peak gradient, 10.5 mmHg. The mean mitral valve gradient is  3.0 mmHg. Tricuspid Valve: The tricuspid valve is normal in structure. Tricuspid valve regurgitation is mild . No evidence of tricuspid stenosis. Aortic Valve: The aortic valve is calcified. Aortic valve regurgitation is not visualized. Aortic valve sclerosis is present, with no evidence of aortic valve stenosis. Aortic valve mean gradient measures 4.3 mmHg. Aortic valve peak gradient measures 8.8  mmHg. Aortic valve area, by VTI measures 2.14 cm. Pulmonic Valve: The pulmonic valve was not well visualized. Pulmonic valve regurgitation is not visualized. No evidence of pulmonic stenosis.  Aorta: The aortic root and ascending aorta are structurally normal, with no evidence of dilitation. Venous: The inferior vena cava was not well visualized. IAS/Shunts: The interatrial septum was not well visualized.  LEFT VENTRICLE PLAX 2D LVIDd:         4.20 cm LVIDs:         2.80 cm LV PW:         1.10 cm LV IVS:        0.85 cm LVOT diam:     2.00 cm LV SV:         48 LV SV Index:   31 LVOT Area:     3.14 cm  RIGHT VENTRICLE RV Basal diam:  3.40 cm RV S prime:     11.90 cm/s TAPSE (M-mode): 3.5 cm LEFT ATRIUM              Index         RIGHT ATRIUM           Index LA diam:        5.40 cm  3.45 cm/m    RA Area:     20.60 cm LA Vol (A2C):   163.0 ml 104.18 ml/m  RA Volume:   56.90 ml  36.37 ml/m LA Vol (A4C):   155.0 ml 99.07 ml/m LA Biplane Vol: 158.0 ml 100.99 ml/m  AORTIC VALVE                    PULMONIC VALVE AV Area (Vmax):    1.93 cm     PV Vmax:        0.84 m/s AV Area (Vmean):   2.07 cm     PV Vmean:       54.900 cm/s AV Area (VTI):     2.14 cm     PV VTI:         0.124 m AV Vmax:           148.67 cm/s  PV Peak grad:   2.8 mmHg AV Vmean:          98.533 cm/s  PV Mean grad:   1.0 mmHg AV VTI:            0.226 m      RVOT Peak grad: 4 mmHg AV Peak Grad:      8.8 mmHg AV Mean Grad:      4.3 mmHg LVOT Vmax:         91.40 cm/s LVOT Vmean:        64.800 cm/s LVOT VTI:          0.154 m LVOT/AV VTI ratio: 0.68  AORTA Ao Root diam: 2.77 cm MITRAL VALVE                TRICUSPID VALVE MV Area (PHT): 2.51 cm     TR Peak grad:   24.6 mmHg MV Area VTI:   1.58 cm     TR  Vmax:        248.00 cm/s MV Peak grad:  10.5 mmHg MV Mean grad:  3.0 mmHg     SHUNTS MV Vmax:       1.62 m/s     Systemic VTI:  0.15 m MV Vmean:      74.1 cm/s    Systemic Diam: 2.00 cm MV Decel Time: 302 msec     Pulmonic VTI:  0.136 m MV E velocity: 132.00 cm/s Donnelly Angelica Electronically signed by Donnelly Angelica Signature Date/Time: 08/10/2021/1:19:46 PM    Final      Subjective: No acute issues or events overnight, left shoulder pain ongoing but  marginally improving with analgesics.   Discharge Exam: Vitals:   08/10/21 1200 08/10/21 1455  BP: (!) 143/100 (!) 142/88  Pulse: 88 90  Resp: 19 18  Temp: 98.6 F (37 C) 99.1 F (37.3 C)  SpO2: 98% 97%   Vitals:   08/10/21 0500 08/10/21 0800 08/10/21 1200 08/10/21 1455  BP: (!) 107/95 110/88 (!) 143/100 (!) 142/88  Pulse: 61 90 88 90  Resp: _0 Temp:  98.4 F (36.9 C) 98.6 F (37 C) 99.1 F (37.3 C)  TempSrc:  Oral Oral Oral  SpO2: 96% 100% 98% 97%  Weight:      Height:        General: Pt is alert, awake, not in acute distress Cardiovascular: RRR, S1/S2 +, no rubs, no gallops Respiratory: CTA bilaterally, no wheezing, no rhonchi Abdominal: Soft, NT, ND, bowel sounds + Extremities: no edema, no cyanosis    The results of significant diagnostics from this hospitalization (including imaging, microbiology, ancillary and laboratory) are listed below for reference.     Microbiology: Recent Results (from the past 240 hour(s))  Culture, blood (Routine X 2) w Reflex to ID Panel     Status: None (Preliminary result)   Collection Time: 08/09/21  1:46 AM   Specimen: BLOOD  Result Value Ref Range Status   Specimen Description BLOOD BLOOD RIGHT FOREARM  Final   Special Requests   Final    BOTTLES DRAWN AEROBIC AND ANAEROBIC Blood Culture results may not be optimal due to an inadequate volume of blood received in culture bottles   Culture   Final    NO GROWTH < 12 HOURS Performed at [facility], [address], [place], [place] [address]    Report Status PENDING  Incomplete  Culture, blood (Routine X 2) w Reflex to ID Panel     Status: None (Preliminary result)   Collection Time: 08/09/21  1:46 AM   Specimen: BLOOD  Result Value Ref Range Status   Specimen Description BLOOD BLOOD RIGHT FOREARM  Final   Special Requests   Final    BOTTLES DRAWN AEROBIC AND ANAEROBIC Blood Culture adequate volume   Culture   Final    NO GROWTH < 12 HOURS Performed at  [facility], [address], [place], [place] [address]    Report Status PENDING  Incomplete  Resp Panel by RT-PCR (Flu A&B, Covid) Nasopharyngeal Swab     Status: None   Collection Time: 08/09/21  2:06 AM   Specimen: Nasopharyngeal Swab; Nasopharyngeal(NP) swabs in vial transport medium  Result Value Ref Range Status   SARS Coronavirus 2 by RT PCR NEGATIVE NEGATIVE Final    Comment: (NOTE) SARS-CoV-2 target nucleic acids are NOT DETECTED.  The SARS-CoV-2 RNA is generally detectable in upper respiratory specimens during the acute phase of infection. The  lowest concentration of SARS-CoV-2 viral copies this assay can detect is 138 copies/mL. A negative result does not preclude SARS-Cov-2 infection and should not be used as the sole basis for treatment or other patient management decisions. A negative result may occur with  improper specimen collection/handling, submission of specimen other than nasopharyngeal swab, presence of viral mutation(s) within the areas targeted by this assay, and inadequate number of viral copies(<138 copies/mL). A negative result must be combined with clinical observations, patient history, and epidemiological information. The expected result is Negative.  Fact Sheet for Patients:  EntrepreneurPulse.com.au  Fact Sheet for Healthcare Providers:  IncredibleEmployment.be  This test is no t yet approved or cleared by the [place] FDA and  has been authorized for detection and/or diagnosis of SARS-CoV-2 by FDA under an Emergency Use Authorization (EUA). This EUA will remain  in effect (meaning this test can be used) for the duration of the COVID-19 declaration under Section 564(b)(1) of the Act, 21 U.S.C.section 360bbb-3(b)(1), unless the authorization is terminated  or revoked sooner.       Influenza A by PCR NEGATIVE NEGATIVE Final   Influenza B by PCR NEGATIVE NEGATIVE Final    Comment: (NOTE) The  Xpert Xpress SARS-CoV-2/FLU/RSV plus assay is intended as an aid in the diagnosis of influenza from Nasopharyngeal swab specimens and should not be used as a sole basis for treatment. Nasal washings and aspirates are unacceptable for Xpert Xpress SARS-CoV-2/FLU/RSV testing.  Fact Sheet for Patients: EntrepreneurPulse.com.au  Fact Sheet for Healthcare Providers: IncredibleEmployment.be  This test is not yet approved or cleared by the [place] FDA and has been authorized for detection and/or diagnosis of SARS-CoV-2 by FDA under an Emergency Use Authorization (EUA). This EUA will remain in effect (meaning this test can be used) for the duration of the COVID-19 declaration under Section 564(b)(1) of the Act, 21 U.S.C. section 360bbb-3(b)(1), unless the authorization is terminated or revoked.  Performed at [facility], [address] [place], [place] [address]      Labs: BNP (last 3 results) No results for input(s): BNP in the last 8760 hours. Basic Metabolic Panel: Recent Labs  Lab 08/04/21 0754 08/09/21 0148 08/10/21 0624  NA 138 134* 135  K 4.3 2.7* 3.3*  CL 105 100 104  CO2 _0 GLUCOSE 111* 148* 85  BUN _1 CREATININE 0.63 0.68 0.55  CALCIUM 8.5* 7.6* 7.4*  MG 1.4* 1.2* 1.4*   Liver Function Tests: Recent Labs  Lab 08/09/21 0148 08/10/21 0624  AST 48* 35  ALT 25 23  ALKPHOS 45 43  BILITOT 1.1 1.5*  PROT 6.4* 5.8*  ALBUMIN 2.6* 2.2*   No results for input(s): LIPASE, AMYLASE in the last 168 hours. No results for input(s): AMMONIA in the last 168 hours. CBC: Recent Labs  Lab 08/04/21 0754 08/09/21 0148 08/10/21 0624  WBC 1.8* 3.1* 3.1*  NEUTROABS 0.9* 2.0 2.1  HGB 10.8* 10.3* 9.5*  HCT 32.4* 29.4* 27.5*  MCV 104.2* 103.2* 102.2*  PLT 187 252 167   Cardiac Enzymes: No results for input(s): CKTOTAL, CKMB, CKMBINDEX, TROPONINI in the last 168 hours. BNP: Invalid input(s):  POCBNP CBG: No results for input(s): GLUCAP in the last 168 hours. D-Dimer No results for input(s): DDIMER in the last 72 hours. Hgb A1c No results for input(s): HGBA1C in the last 72 hours. Lipid Profile No results for input(s): CHOL, HDL, LDLCALC, TRIG, CHOLHDL, LDLDIRECT in the last 72 hours. Thyroid function studies Recent Labs  08/09/21 1133  TSH 1.397   Anemia work up No results for input(s): VITAMINB12, FOLATE, FERRITIN, TIBC, IRON, RETICCTPCT in the last 72 hours. Urinalysis    Component Value Date/Time   COLORURINE YELLOW (A) 08/09/2021 0148   APPEARANCEUR CLEAR (A) 08/09/2021 0148   APPEARANCEUR Cloudy (A) 05/04/2021 1110   LABSPEC 1.008 08/09/2021 0148   PHURINE 8.0 08/09/2021 0148   GLUCOSEU NEGATIVE 08/09/2021 0148   HGBUR NEGATIVE 08/09/2021 0148   BILIRUBINUR NEGATIVE 08/09/2021 0148   BILIRUBINUR Negative 05/04/2021 [place] NEGATIVE 08/09/2021 0148   PROTEINUR NEGATIVE 08/09/2021 0148   NITRITE NEGATIVE 08/09/2021 0148   LEUKOCYTESUR TRACE (A) 08/09/2021 0148   Sepsis Labs Invalid input(s): PROCALCITONIN,  WBC,  LACTICIDVEN Microbiology Recent Results (from the past 240 hour(s))  Culture, blood (Routine X 2) w Reflex to ID Panel     Status: None (Preliminary result)   Collection Time: 08/09/21  1:46 AM   Specimen: BLOOD  Result Value Ref Range Status   Specimen Description BLOOD BLOOD RIGHT FOREARM  Final   Special Requests   Final    BOTTLES DRAWN AEROBIC AND ANAEROBIC Blood Culture results may not be optimal due to an inadequate volume of blood received in culture bottles   Culture   Final    NO GROWTH < 12 HOURS Performed at [facility], [address], [place], [place] [address]    Report Status PENDING  Incomplete  Culture, blood (Routine X 2) w Reflex to ID Panel     Status: None (Preliminary result)   Collection Time: 08/09/21  1:46 AM   Specimen: BLOOD  Result Value Ref Range Status   Specimen Description BLOOD BLOOD  RIGHT FOREARM  Final   Special Requests   Final    BOTTLES DRAWN AEROBIC AND ANAEROBIC Blood Culture adequate volume   Culture   Final    NO GROWTH < 12 HOURS Performed at [facility], [address], [place], [place] [address]    Report Status PENDING  Incomplete  Resp Panel by RT-PCR (Flu A&B, Covid) Nasopharyngeal Swab     Status: None   Collection Time: 08/09/21  2:06 AM   Specimen: Nasopharyngeal Swab; Nasopharyngeal(NP) swabs in vial transport medium  Result Value Ref Range Status   SARS Coronavirus 2 by RT PCR NEGATIVE NEGATIVE Final    Comment: (NOTE) SARS-CoV-2 target nucleic acids are NOT DETECTED.  The SARS-CoV-2 RNA is generally detectable in upper respiratory specimens during the acute phase of infection. The lowest concentration of SARS-CoV-2 viral copies this assay can detect is 138 copies/mL. A negative result does not preclude SARS-Cov-2 infection and should not be used as the sole basis for treatment or other patient management decisions. A negative result may occur with  improper specimen collection/handling, submission of specimen other than nasopharyngeal swab, presence of viral mutation(s) within the areas targeted by this assay, and inadequate number of viral copies(<138 copies/mL). A negative result must be combined with clinical observations, patient history, and epidemiological information. The expected result is Negative.  Fact Sheet for Patients:  EntrepreneurPulse.com.au  Fact Sheet for Healthcare Providers:  IncredibleEmployment.be  This test is no t yet approved or cleared by the [place] FDA and  has been authorized for detection and/or diagnosis of SARS-CoV-2 by FDA under an Emergency Use Authorization (EUA). This EUA will remain  in effect (meaning this test can be used) for the duration of the COVID-19 declaration under Section 564(b)(1) of the Act, 21 U.S.C.section 360bbb-3(b)(1), unless  the  authorization is terminated  or revoked sooner.       Influenza A by PCR NEGATIVE NEGATIVE Final   Influenza B by PCR NEGATIVE NEGATIVE Final    Comment: (NOTE) The Xpert Xpress SARS-CoV-2/FLU/RSV plus assay is intended as an aid in the diagnosis of influenza from Nasopharyngeal swab specimens and should not be used as a sole basis for treatment. Nasal washings and aspirates are unacceptable for Xpert Xpress SARS-CoV-2/FLU/RSV testing.  Fact Sheet for Patients: EntrepreneurPulse.com.au  Fact Sheet for Healthcare Providers: IncredibleEmployment.be  This test is not yet approved or cleared by the [place] FDA and has been authorized for detection and/or diagnosis of SARS-CoV-2 by FDA under an Emergency Use Authorization (EUA). This EUA will remain in effect (meaning this test can be used) for the duration of the COVID-19 declaration under Section 564(b)(1) of the Act, 21 U.S.C. section 360bbb-3(b)(1), unless the authorization is terminated or revoked.  Performed at [facility], [address], [place], [place] [address]      Time coordinating discharge: Over 30 minutes  SIGNED:   Little Ishikawa, DO [facility] 08/10/2021, 4:13 PM Pager   If 7PM-7AM, please contact night-coverage [email or web address]

## 2021-08-10 NOTE — Evaluation (Signed)
Occupational Therapy Evaluation Patient Details Name: Kristina Ellis MRN: [id number] DOB: [birth year] Today's Date: 08/10/2021   History of Present Illness presented to ER secondary to L shoulder pain, weakness and lightheadedness; admitted for management of hypotension, afib with RVR, hypokalemia and L shoulder pain (pending steroid injection per notes)   Clinical Impression   Patient presenting with decreased Ind in self care, balance, functional mobility/transfers, endurance, and safety awareness. Patient reports living at home with son and is independent in self care and IADLs. She also tried to help her mother with shopping when she can. Pt currently reporting 10/10 pain in L shoulder with numbness and tingling down into digits. Pt is limited with AROM secondary to pain. Actively able to move digits, wrist, and elbow in all planes. Shoulder flexion limited to ~ 50 degrees. Pt refusing OOB activity secondary to pain. Bed mobility and repositioning for comfort with min A. Patient will benefit from acute OT to increase overall independence in the areas of ADLs, functional mobility, and safety awareness in order to safely discharge home with family.      Recommendations for follow up therapy are one component of a multi-disciplinary discharge planning process, led by the attending physician.  Recommendations may be updated based on patient status, additional functional criteria and insurance authorization.   Follow Up Recommendations  Home health OT    Assistance Recommended at Discharge Frequent or constant Supervision/Assistance  Functional Status Assessment  Patient has had a recent decline in their functional status and demonstrates the ability to make significant improvements in function in a reasonable and predictable amount of time.  Equipment Recommendations  None recommended by OT       Precautions / Restrictions Precautions Precautions: Fall Restrictions Weight Bearing  Restrictions: No      Mobility Bed Mobility Overal bed mobility: Needs Assistance Bed Mobility: Rolling Rolling: Min assist              Transfers                   General transfer comment: Pt refusal secondary to pain      Balance Overall balance assessment: Needs assistance Sitting-balance support: No upper extremity supported;Feet supported Sitting balance-Leahy Scale: Good                                     ADL either performed or assessed with clinical judgement   ADL Overall ADL's : Needs assistance/impaired                                       General ADL Comments: Pt limited secondary to pain and needing increased assistance.     Vision Patient Visual Report: No change from baseline Vision Assessment?: No apparent visual deficits            Pertinent Vitals/Pain Pain Assessment: Faces Faces Pain Scale: Hurts worst Pain Location: L shoulder Pain Descriptors / Indicators: Aching;Grimacing;Guarding Pain Intervention(s): Limited activity within patient's tolerance;Monitored during session;Repositioned     Hand Dominance Right   Extremity/Trunk Assessment Upper Extremity Assessment Upper Extremity Assessment: LUE deficits/detail LUE Deficits / Details: limited for ROM to 50-60 degress LUE: Shoulder pain with ROM;Unable to fully assess due to pain   Lower Extremity Assessment Lower Extremity Assessment: Defer to PT evaluation  Communication     Cognition Arousal/Alertness: Awake/alert Behavior During Therapy: WFL for tasks assessed/performed Overall Cognitive Status: Within Functional Limits for tasks assessed                                                  Home Living Family/patient expects to be discharged to:: Private residence Living Arrangements: Children (son) Available Help at Discharge: Family;Available PRN/intermittently Type of Home: House Home Access: Level  entry     Home Layout: One level     Bathroom Shower/Tub: Tub/shower unit         Home Equipment: Cane - single Barista (2 wheels)          Prior Functioning/Environment Prior Level of Function : Needs assist             Mobility Comments: Mod indep with household mobilization, use of SPC as needed; does utilize RW (or Therapist, music) for longer distances/with appointments. Denies recent fall history ADLs Comments: Son assists with household chores, driving and errands as needed        OT Problem List: Decreased strength;Decreased activity tolerance;Impaired balance (sitting and/or standing);Decreased safety awareness;Pain;Decreased range of motion      OT Treatment/Interventions: Self-care/ADL training;Manual therapy;Therapeutic exercise;Modalities;Patient/family education;Balance training;Energy conservation;Therapeutic activities;Cognitive remediation/compensation    OT Goals(Current goals can be found in the care plan section) Acute Rehab OT Goals Patient Stated Goal: to decrease pain OT Goal Formulation: With patient Time For Goal Achievement: 08/24/21 Potential to Achieve Goals: Good ADL Goals Pt Will Perform Grooming: with supervision;standing Pt Will Perform Upper Body Bathing: with supervision;sitting Pt Will Perform Upper Body Dressing: with supervision;sitting Pt Will Transfer to Toilet: with supervision Pt Will Perform Toileting - Clothing Manipulation and hygiene: with supervision  OT Frequency: Min 2X/week   Barriers to D/C:    none known at this time          AM-PAC OT "6 Clicks" Daily Activity     Outcome Measure Help from another person eating meals?: A Little Help from another person taking care of personal grooming?: A Little Help from another person toileting, which includes using toliet, bedpan, or urinal?: A Little Help from another person bathing (including washing, rinsing, drying)?: A Little Help from another person  to put on and taking off regular upper body clothing?: A Little Help from another person to put on and taking off regular lower body clothing?: A Little 6 Click Score: 18   End of Session Nurse Communication: Mobility status  Activity Tolerance: Patient limited by pain Patient left: in bed;with call bell/phone within reach  OT Visit Diagnosis: Unsteadiness on feet (R26.81);Repeated falls (R29.6);Muscle weakness (generalized) (M62.81)                Time: 5625-6389 OT Time Calculation (min): 22 min Charges:  OT General Charges $OT Visit: 1 Visit OT Evaluation $OT Eval Moderate Complexity: 1 Mod OT Treatments $Therapeutic Activity: 8-22 mins  Darleen Crocker, MS, OTR/L , CBIS ascom [phone]  08/10/21, 3:50 PM

## 2021-08-10 NOTE — Discharge Planning (Signed)
1700  I spoke with patient's brother/Mike who agreed to come and transport patient home.  Stated he would go by the house and get some clothes for patient to change into (first) and then come up to the hospital.

## 2021-08-11 ENCOUNTER — Inpatient Hospital Stay: Payer: Medicare Other

## 2021-08-11 ENCOUNTER — Telehealth: Payer: Self-pay | Admitting: *Deleted

## 2021-08-11 ENCOUNTER — Ambulatory Visit: Payer: Medicare Other

## 2021-08-11 ENCOUNTER — Telehealth: Payer: Self-pay | Admitting: Oncology

## 2021-08-11 NOTE — Telephone Encounter (Signed)
Patient was seen in the ED where she received IVF's and IV magnesium on 08/09/21.

## 2021-08-11 NOTE — Telephone Encounter (Signed)
Patient left message with answering service to cancel all appointments for 1/3.  Secure chat message sent to team for follow up.

## 2021-08-12 ENCOUNTER — Ambulatory Visit: Payer: Medicare Other

## 2021-08-13 ENCOUNTER — Ambulatory Visit: Payer: Medicare Other

## 2021-08-14 ENCOUNTER — Ambulatory Visit: Payer: Medicare Other

## 2021-08-14 LAB — CULTURE, BLOOD (ROUTINE X 2)
Culture: NO GROWTH
Culture: NO GROWTH
Special Requests: ADEQUATE

## 2021-08-16 NOTE — Progress Notes (Signed)
Kristina Ellis  [phone] [phone]  ID: [name] OB: 02-07-1951  MR#: [id number]  JJK#:[id number]  Patient Care Team: [facility] [place] as PCP - General (General Practice) Lloyd Huger, MD as Consulting Physician (Hematology and Oncology)  CHIEF COMPLAINT: Stage II bladder cancer.  INTERVAL HISTORY: Patient returns to clinic today for repeat laboratory work and further evaluation.  She was admitted to the hospital approximately a week and a half ago for hypotension and a fall injuring her shoulder.  She continues to have shoulder discomfort, but otherwise feels well.  She will complete XRT tomorrow.  She continues to have chronic weakness and fatigue.  She denies any other pain.  She has no neurologic complaints.  She denies any recent fevers or illnesses.  She has a good appetite and denies weight loss.  She has no chest pain, shortness of breath, cough, or hemoptysis.  She denies any nausea, vomiting, constipation, or diarrhea.  She has no urinary complaints.  Patient offers no further specific complaints today.  REVIEW OF SYSTEMS:   Review of Systems  Constitutional:  Positive for malaise/fatigue. Negative for fever and weight loss.  Respiratory: Negative.  Negative for cough, hemoptysis and shortness of breath.   Cardiovascular: Negative.  Negative for chest pain and leg swelling.  Gastrointestinal: Negative.  Negative for abdominal pain.  Genitourinary:  Negative for hematuria.  Musculoskeletal:  Positive for falls and joint pain. Negative for back pain.  Skin: Negative.  Negative for rash.  Neurological:  Positive for weakness. Negative for dizziness, seizures and headaches.  Psychiatric/Behavioral: Negative.  The patient is not nervous/anxious.    As per HPI. Otherwise, a complete review of systems is negative.  PAST MEDICAL HISTORY: Past Medical History:  Diagnosis Date   Aortic atherosclerosis ([place])     Arthritis    Atrial fibrillation ([place])    a.) s/p TEE with cardioversion on 02/04/2020. b.) on daily apixaban.   Bladder mass    a.) CT 04/08/2021 --> 3.2 cm intraluminal bladder mass.   Carotid stenosis, bilateral    a.) Doppler 07/04/2020 --> mild; 1-49% stenosis BILATERALLY.   Cholelithiasis    a.) CT 11/06/2020 --> largest measured 4.5 cm   Chronic anticoagulation    a.) Apixaban   Common biliary duct calculus    a.) CT 04/08/2021 --> CBD dilated at 8 mm; 43mm calculus within distal CBD.   Coronary artery disease involving native coronary artery of native heart without angina pectoris 3/71/6967   Diastolic dysfunction    a.) TTE 07/04/2020 --> LVEF 60-65%; G1DD.   Hepatic cirrhosis (H[facility])    Hepatic steatosis    Hepatitis 1970   History of 2019 novel coronavirus disease (COVID-19) 12/20/2019   History of marijuana use    Hypertension    Hypertensive retinopathy    IDA (iron deficiency anemia)    Mitral stenosis    a.) TEE 04/10/2020 --> mild. b.) TTE 07/04/2020 --> EF 60-65%; mild (mean gradient 5 mmHg).   Moderate pulmonary arterial systolic hypertension ([place]) 12/22/2019   Nephrolithiasis    NSTEMI (non-ST elevated myocardial infarction) ([place]) 07/26/2016   Open-angle glaucoma 09/11/2010   PAH (pulmonary artery hypertension) ([place])    a.) TTE 12/21/2019 --> PASP 44 mmHg.   Uterine fibroid    a.) CT 04/08/2021 --> multiple with largest measuring 7 cm.   Valvular regurgitation    a.) TTE 09/01/2010 --> trivial to mild pan-valvular. b.) TTE 12/21/2019 --> mild MR and AR; moderate TR.  c.) TTE 07/04/2020 --> mild TR; trivial MR and PR.   Vestibular neuronitis     PAST SURGICAL HISTORY: Past Surgical History:  Procedure Laterality Date   BREAST CYST ASPIRATION Left    COLONOSCOPY  2012   TEE WITH CARDIOVERSION N/A 02/04/2020   Procedure: TEE WITH CARDIOVERSION; Location: [facility]; Surgeon: Kandis Cocking, MD   TRANSURETHRAL RESECTION OF BLADDER TUMOR N/A 05/15/2021   Procedure:  TRANSURETHRAL RESECTION OF BLADDER TUMOR (TURBT);  Surgeon: Billey Co, MD;  Location: [facility];  Service: Urology;  Laterality: N/A;    FAMILY HISTORY: Family History  Problem Relation Age of Onset   Aneurysm Mother    Colon cancer Father    Lung cancer Brother    Breast cancer Neg Hx     ADVANCED DIRECTIVES (Y/N):  N  HEALTH MAINTENANCE: Social History   Tobacco Use   Smoking status: Former    Packs/day: 0.10    Years: 10.00    Pack years: 1.00    Types: Cigarettes   Smokeless tobacco: Never   Tobacco comments:    occasional smoker  Vaping Use   Vaping Use: Never used  Substance Use Topics   Alcohol use: Yes    Alcohol/week: 5.0 standard drinks    Types: 5 Cans of beer per week    Comment: weekly   Drug use: Not Currently    Types: 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 30 tablet 0   ELIQUIS 5 MG TABS tablet Take 5 mg by mouth 2 (two) times daily.     FEROSUL 325 (65 Fe) MG tablet Take 325 mg by mouth daily.     magnesium oxide (MAG-OX) 400 MG tablet Take 1 tablet by mouth 2 (two) times daily.     metoprolol succinate (TOPROL-XL) 25 MG 24 hr tablet Take 1 tablet (25 mg total) by mouth daily. Take with or immediately following a meal. 30 tablet 0   ondansetron (ZOFRAN) 8 MG tablet Take 1 tablet (8 mg total) by mouth 2 (two) times daily as needed. Start on the third day after cisplatin chemotherapy. 60 tablet 1   oxyCODONE-acetaminophen (PERCOCET/ROXICET) 5-325 MG tablet Take 1 tablet by mouth every 6 (six) hours as needed for moderate pain or severe pain. 30 tablet 0   predniSONE (DELTASONE) 10 MG tablet Take 4 tablets (40  mg total) by mouth daily for 3 days, THEN 3 tablets (30 mg total) daily for 3 days, THEN 2 tablets (20 mg total) daily for 3 days, THEN 1 tablet (10 mg total) daily for 3 days. 30 tablet 0   prochlorperazine (COMPAZINE) 10 MG tablet Take 1 tablet (10 mg total) by mouth every 6 (six) hours as needed (Nausea or vomiting). 60 tablet 1   trolamine salicylate (ASPERCREME) 10 % cream Apply 1 application topically as needed for muscle pain.     No current facility-administered medications for this visit.    OBJECTIVE: Vitals:   08/18/21 1036  BP: 128/82  Pulse: 88  Resp: 16  Temp: 98.4 F (36.9 C)  SpO2: 98%     Body mass index is 27.[place]wey  kg/m.    ECOG FS:1 - Symptomatic but completely ambulatory  General: Well-developed, well-nourished, no acute distress.  Sitting in a wheelchair. Eyes: Pink conjunctiva, anicteric sclera. HEENT: Normocephalic, moist mucous membranes. Lungs: No audible wheezing or coughing. Heart: Regular rate and rhythm. Abdomen: Soft, nontender, no obvious distention. Musculoskeletal: No edema, cyanosis, or clubbing. Neuro: Alert, answering all questions appropriately. Cranial nerves grossly intact. Skin: No rashes or petechiae noted. Psych: Normal affect.   LAB RESULTS:  Lab Results  Component Value Date   NA 135 08/10/2021   K 3.3 (L) 08/10/2021   CL 104 08/10/2021   CO2 24 08/10/2021   GLUCOSE 85 08/10/2021   BUN 11 08/10/2021   CREATININE 0.55 08/10/2021   CALCIUM 7.4 (L) 08/10/2021   PROT 5.8 (L) 08/10/2021   ALBUMIN 2.2 (L) 08/10/2021   AST 35 08/10/2021   ALT 23 08/10/2021   ALKPHOS 43 08/10/2021   BILITOT 1.5 (H) 08/10/2021   GFRNONAA >60 08/10/2021   GFRAA >60 03/07/2018    Lab Results  Component Value Date   WBC 3.1 (L) 08/10/2021   NEUTROABS 2.1 08/10/2021   HGB 9.5 (L) 08/10/2021   HCT 27.5 (L) 08/10/2021   MCV 102.2 (H) 08/10/2021   PLT 167 08/10/2021     STUDIES: MR SHOULDER LEFT W WO CONTRAST  Result Date: 08/09/2021 CLINICAL  DATA:  Left shoulder pain. EXAM: MRI OF THE LEFT SHOULDER WITHOUT AND WITH CONTRAST TECHNIQUE: Multiplanar, multisequence MR imaging of the left shoulder was performed before and after the administration of intravenous contrast. CONTRAST:  7.34mL GADAVIST GADOBUTROL 1 MMOL/ML IV SOLN COMPARISON:  Radiographs 08/09/2021 FINDINGS: Rotator cuff: Significant rotator cuff tendinopathy/tendinosis with interstitial tears and a shallow partial-thickness articular surface tear near the supraspinatus attachment site. No definite full-thickness retracted tear is identified. Muscles: No acute muscle injury but there is moderate diffuse fatty atrophy of the shoulder musculature. Biceps long head:  Intact Acromioclavicular Joint: Mild degenerative changes. The undersurface of the acromion is very irregular and I suspect the patient has had prior arthroscopic acromioplasty. Os acromiale also suspected. Type 2 acromion. No lateral downsloping or subacromial spurring. Glenohumeral Joint: Advanced degenerative changes with findings highly suggestive of an inflammatory/erosive arthropathy with a large joint effusion and extensive synovitis. PVNS is possible. Labrum:  Degenerated and torn. Bones:  No acute bony findings. Other: Extensive subacromial/subdeltoid bursitis. IMPRESSION: 1. 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 PVNS is possible. 2. Significant rotator cuff tendinopathy/tendinosis with interstitial tears and a shallow partial-thickness articular surface tear near the supraspinatus attachment site. No full-thickness retracted tear. 3. Intact long head biceps tendon. 4. Degenerated and torn glenoid labrum. 5. Extensive subacromial/subdeltoid bursitis. Electronically Signed   By: Marijo Sanes M.D.   On: 08/09/2021 12:42   DG Chest Portable 1 View  Result Date: 08/09/2021 CLINICAL DATA:  Initial evaluation for hypotension, near syncope.  EXAM: PORTABLE CHEST 1 VIEW COMPARISON:  Radiograph from 07/03/2020. FINDINGS: Transverse heart size within normal limits. Mediastinal silhouette stable, and remains within normal limits. Aortic atherosclerosis. Heterogeneous calcification along the left lateral margin of the lower thoracic vertebral column noted, likely degenerative and/or reflecting costochondral calcifications. Lungs mildly hypoinflated. No focal infiltrates. No edema or effusion. No pneumothorax. No acute osseous finding. 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 IMPRESSION: 1. No active cardiopulmonary disease. 2.  Aortic Atherosclerosis (ICD10-I70.0). Electronically Signed   By: Jeannine Boga M.D.   On: 08/09/2021 03:10   DG Shoulder Left  Result Date: 08/09/2021 CLINICAL DATA:  Initial evaluation for acute left shoulder pain. EXAM: LEFT SHOULDER - 2+ VIEW COMPARISON:  None. FINDINGS: No acute fracture or dislocation. Advanced osteoarthritic changes present about the left glenohumeral articulation. Chronic Hill-Sachs deformity at the left humeral head consistent with prior dislocation. No visible soft tissue abnormality. Visualized left hemithorax largely clear. IMPRESSION: 1. No acute osseous abnormality about the left shoulder. 2. 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 3. Chronic Hill-Sachs deformity at the left humeral head, consistent with prior dislocation. Electronically Signed   By: Jeannine Boga M.D.   On: 08/09/2021 03:06   DG Humerus Left  Result Date: 08/09/2021 CLINICAL DATA:  Initial evaluation for acute left arm pain. EXAM: LEFT HUMERUS - 2+ VIEW COMPARISON:  Concomitant radiograph of the left shoulder. FINDINGS: No acute fracture or dislocation. Advanced osteoarthritic changes about the left shoulder, better evaluated on concomitant radiograph of the left shoulder. Osteoarthritic changes noted about the elbow as well. No visible soft tissue  abnormality. IMPRESSION: 1. No acute osseous abnormality about the left humerus. 2. 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 Electronically Signed   By: Jeannine Boga M.D.   On: 08/09/2021 03:07   ECHOCARDIOGRAM COMPLETE  Result Date: 08/10/2021    ECHOCARDIOGRAM REPORT   Patient Name:   Kristina Ellis Date of Exam: 08/10/2021 Medical Rec #:  [id number]        Height:       59.0 in Accession #:    [id number]       Weight:       135.8 lb Date of Birth:  Oct 04, 1950         BSA:          1.565 m Patient Age:    71 years         BP:           107/95 mmHg Patient Gender: F                HR:           61 bpm. Exam Location:  [facility] Procedure: 2D Echo, Cardiac Doppler and Color Doppler Indications:     Atrial Fibrillation I48.91  History:         Patient has prior history of Echocardiogram examinations, most                  recent 07/04/2021. CAD, Arrythmias:Atrial Fibrillation; Risk                  Factors:Hypertension.  Sonographer:     Sherrie Sport Referring Phys:  [id number] [place] Diagnosing Phys: Donnelly Angelica  Sonographer Comments: Suboptimal apical window. IMPRESSIONS  1. Left ventricular ejection fraction, by estimation, is 60 to 65%. The left ventricle has normal function. The left ventricle has no regional wall motion abnormalities. Left ventricular diastolic parameters are indeterminate.  2. Right ventricular systolic function was not well visualized. The right ventricular size is not well visualized.  3. Left atrial size was severely dilated.  4. The mitral valve is degenerative. Mild mitral valve regurgitation. Mild mitral stenosis. Severe mitral annular calcification.  5. The aortic valve is calcified. Aortic valve regurgitation is not visualized. Aortic valve sclerosis is present, with no evidence of aortic valve stenosis. FINDINGS  Left Ventricle: Left ventricular ejection fraction, by estimation, is 60 to 65%. The left  ventricle has normal function. The left ventricle has no regional wall  motion abnormalities. The left ventricular internal cavity size was normal in size. There is  no left ventricular hypertrophy. Left ventricular diastolic parameters are indeterminate. Right Ventricle: The right ventricular size is not well visualized. Right vetricular wall thickness was not well visualized. Right ventricular systolic function was not well visualized. Left Atrium: Left atrial size was severely dilated. Right Atrium: Right atrial size was not well visualized. Pericardium: There is no evidence of pericardial effusion. Mitral Valve: The mitral valve is degenerative in appearance. Severe mitral annular calcification. Mild mitral valve regurgitation. Mild mitral valve stenosis. MV peak gradient, 10.5 mmHg. The mean mitral valve gradient is 3.0 mmHg. Tricuspid Valve: The tricuspid valve is normal in structure. Tricuspid valve regurgitation is mild . No evidence of tricuspid stenosis. Aortic Valve: The aortic valve is calcified. Aortic valve regurgitation is not visualized. Aortic valve sclerosis is present, with no evidence of aortic valve stenosis. Aortic valve mean gradient measures 4.3 mmHg. Aortic valve peak gradient measures 8.8  mmHg. Aortic valve area, by VTI measures 2.14 cm. Pulmonic Valve: The pulmonic valve was not well visualized. Pulmonic valve regurgitation is not visualized. No evidence of pulmonic stenosis. Aorta: The aortic root and ascending aorta are structurally normal, with no evidence of dilitation. Venous: The inferior vena cava was not well visualized. IAS/Shunts: The interatrial septum was not well visualized.  LEFT VENTRICLE PLAX 2D LVIDd:         4.20 cm LVIDs:         2.80 cm LV PW:         1.10 cm LV IVS:        0.85 cm LVOT diam:     2.00 cm LV SV:         48 LV SV Index:   31 LVOT Area:     3.14 cm  RIGHT VENTRICLE RV Basal diam:  3.40 cm RV S prime:     11.90 cm/s TAPSE (M-mode): 3.5 cm LEFT ATRIUM               Index         RIGHT ATRIUM           Index LA diam:        5.40 cm  3.45 cm/m    RA Area:     20.60 cm LA Vol (A2C):   163.0 ml 104.18 ml/m  RA Volume:   56.90 ml  36.37 ml/m LA Vol (A4C):   155.0 ml 99.07 ml/m LA Biplane Vol: 158.0 ml 100.99 ml/m  AORTIC VALVE                    PULMONIC VALVE AV Area (Vmax):    1.93 cm     PV Vmax:        0.84 m/s AV Area (Vmean):   2.07 cm     PV Vmean:       54.900 cm/s AV Area (VTI):     2.14 cm     PV VTI:         0.124 m AV Vmax:           148.67 cm/s  PV Peak grad:   2.8 mmHg AV Vmean:          98.533 cm/s  PV Mean grad:   1.0 mmHg AV VTI:            0.226 m      RVOT Peak grad: 4 mmHg AV Peak Grad:  8.8 mmHg AV Mean Grad:      4.3 mmHg LVOT Vmax:         91.40 cm/s LVOT Vmean:        64.800 cm/s LVOT VTI:          0.154 m LVOT/AV VTI ratio: 0.68  AORTA Ao Root diam: 2.77 cm MITRAL VALVE                TRICUSPID VALVE MV Area (PHT): 2.51 cm     TR Peak grad:   24.6 mmHg MV Area VTI:   1.58 cm     TR Vmax:        248.00 cm/s MV Peak grad:  10.5 mmHg MV Mean grad:  3.0 mmHg     SHUNTS MV Vmax:       1.62 m/s     Systemic VTI:  0.15 m MV Vmean:      74.1 cm/s    Systemic Diam: 2.00 cm MV Decel Time: 302 msec     Pulmonic VTI:  0.136 m MV E velocity: 132.00 cm/s Donnelly Angelica Electronically signed by Donnelly Angelica Signature Date/Time: 08/10/2021/1:19:46 PM    Final     ASSESSMENT: Stage II bladder cancer.  PLAN:    Stage II bladder cancer: Pathology and imaging reviewed independently.  PET scan results from June 18, 2021 reviewed independently and reported as above with no specific findings of active malignancy.  Since patient is not a surgical candidate she underwent TURBT.  Patient did not have port placement.  Continue with daily XRT completing on August 19, 2021.  Patient received her fourth and last dose of cisplatin on August 04, 2021.  She has cystoscopy scheduled for October 01, 2021.  Return to clinic the following week to discuss the  results and further evaluation. Hypomagnesia: Chronic and unchanged.  Patient does not wish to pursue IV magnesium today.  Continue oral magnesium supplementation.   Neutropenia: Resolved. Anemia: Chronic and unchanged.  Patient's most recent hemoglobin was 9.5. Thrombocytopenia: Resolved.   Fall/shoulder injury: Follow-up with orthopedics as directed. Hypotension: Resolved.   Patient expressed understanding and was in agreement with this plan. She also understands that She can call clinic at any time with any questions, concerns, or complaints.    Cancer Staging  Bladder cancer [facility]) Staging form: Urinary Bladder, AJCC 8th Edition - Clinical stage from 06/04/2021: Stage II (cT2, cN0, cM0) - Signed by Lloyd Huger, MD on 06/04/2021 WHO/ISUP grade (low/high): High Grade Histologic grading system: 2 grade system  Lloyd Huger, MD   08/18/2021 12:27 PM

## 2021-08-17 ENCOUNTER — Ambulatory Visit: Payer: Medicare Other

## 2021-08-17 ENCOUNTER — Ambulatory Visit
Admission: RE | Admit: 2021-08-17 | Discharge: 2021-08-17 | Disposition: A | Discharge status: Home / Self Care | Payer: Medicare Other | Source: Ambulatory Visit | Attending: Radiation Oncology | Admitting: Radiation Oncology

## 2021-08-17 DIAGNOSIS — Z801 Family history of malignant neoplasm of trachea, bronchus and lung: Secondary | ICD-10-CM | POA: Insufficient documentation

## 2021-08-17 DIAGNOSIS — Z510 Encounter for antineoplastic radiation therapy: Secondary | ICD-10-CM | POA: Insufficient documentation

## 2021-08-17 DIAGNOSIS — Z87891 Personal history of nicotine dependence: Secondary | ICD-10-CM | POA: Diagnosis not present

## 2021-08-17 DIAGNOSIS — I119 Hypertensive heart disease without heart failure: Secondary | ICD-10-CM | POA: Insufficient documentation

## 2021-08-17 DIAGNOSIS — D649 Anemia, unspecified: Secondary | ICD-10-CM | POA: Insufficient documentation

## 2021-08-17 DIAGNOSIS — C679 Malignant neoplasm of bladder, unspecified: Secondary | ICD-10-CM | POA: Insufficient documentation

## 2021-08-17 DIAGNOSIS — I2510 Atherosclerotic heart disease of native coronary artery without angina pectoris: Secondary | ICD-10-CM | POA: Diagnosis not present

## 2021-08-17 DIAGNOSIS — Z800 Family history of malignant neoplasm of digestive organs: Secondary | ICD-10-CM | POA: Diagnosis not present

## 2021-08-18 ENCOUNTER — Other Ambulatory Visit: Payer: Self-pay

## 2021-08-18 ENCOUNTER — Inpatient Hospital Stay: Payer: Medicare Other

## 2021-08-18 ENCOUNTER — Ambulatory Visit
Admission: RE | Admit: 2021-08-18 | Discharge: 2021-08-18 | Disposition: A | Discharge status: Home / Self Care | Payer: Medicare Other | Source: Ambulatory Visit | Attending: Radiation Oncology | Admitting: Radiation Oncology

## 2021-08-18 ENCOUNTER — Inpatient Hospital Stay (HOSPITAL_BASED_OUTPATIENT_CLINIC_OR_DEPARTMENT_OTHER): Payer: Medicare Other | Admitting: Oncology

## 2021-08-18 VITALS — BP 128/82 | HR 88 | Temp 98.4°F | Resp 16 | Wt 138.2 lb

## 2021-08-18 DIAGNOSIS — Z801 Family history of malignant neoplasm of trachea, bronchus and lung: Secondary | ICD-10-CM | POA: Insufficient documentation

## 2021-08-18 DIAGNOSIS — D649 Anemia, unspecified: Secondary | ICD-10-CM | POA: Insufficient documentation

## 2021-08-18 DIAGNOSIS — I119 Hypertensive heart disease without heart failure: Secondary | ICD-10-CM | POA: Insufficient documentation

## 2021-08-18 DIAGNOSIS — Z800 Family history of malignant neoplasm of digestive organs: Secondary | ICD-10-CM | POA: Insufficient documentation

## 2021-08-18 DIAGNOSIS — Z87891 Personal history of nicotine dependence: Secondary | ICD-10-CM | POA: Insufficient documentation

## 2021-08-18 DIAGNOSIS — C679 Malignant neoplasm of bladder, unspecified: Secondary | ICD-10-CM | POA: Insufficient documentation

## 2021-08-18 DIAGNOSIS — C678 Malignant neoplasm of overlapping sites of bladder: Secondary | ICD-10-CM

## 2021-08-18 DIAGNOSIS — I2510 Atherosclerotic heart disease of native coronary artery without angina pectoris: Secondary | ICD-10-CM | POA: Insufficient documentation

## 2021-08-18 DIAGNOSIS — Z510 Encounter for antineoplastic radiation therapy: Secondary | ICD-10-CM | POA: Diagnosis not present

## 2021-08-18 NOTE — Progress Notes (Signed)
Pt reports having an "episode" on Saturday with c/o "losing control of myself", difficulty in lifting left arm and nausea. Denies any chest pains. Recalls details of event. Pt says feeling is "still on-going." Pt also reports pain in sacral area after recent hospitalization

## 2021-08-19 ENCOUNTER — Ambulatory Visit
Admission: RE | Admit: 2021-08-19 | Discharge: 2021-08-19 | Disposition: A | Discharge status: Home / Self Care | Payer: Medicare Other | Source: Ambulatory Visit | Attending: Radiation Oncology | Admitting: Radiation Oncology

## 2021-08-19 DIAGNOSIS — Z510 Encounter for antineoplastic radiation therapy: Secondary | ICD-10-CM | POA: Diagnosis not present

## 2021-08-20 ENCOUNTER — Ambulatory Visit: Payer: Medicare Other

## 2021-08-20 ENCOUNTER — Encounter: Payer: Self-pay | Admitting: Oncology

## 2021-09-14 ENCOUNTER — Ambulatory Visit
Admission: RE | Admit: 2021-09-14 | Discharge: 2021-09-14 | Disposition: A | Discharge status: Home / Self Care | Payer: Medicare Other | Source: Ambulatory Visit | Attending: Radiation Oncology | Admitting: Radiation Oncology

## 2021-09-14 ENCOUNTER — Other Ambulatory Visit: Payer: Self-pay

## 2021-09-14 ENCOUNTER — Encounter: Payer: Self-pay | Admitting: Radiation Oncology

## 2021-09-14 VITALS — BP 131/91 | HR 72 | Temp 97.7°F | Resp 16

## 2021-09-14 DIAGNOSIS — R351 Nocturia: Secondary | ICD-10-CM | POA: Diagnosis not present

## 2021-09-14 DIAGNOSIS — R197 Diarrhea, unspecified: Secondary | ICD-10-CM | POA: Insufficient documentation

## 2021-09-14 DIAGNOSIS — C678 Malignant neoplasm of overlapping sites of bladder: Secondary | ICD-10-CM

## 2021-09-14 DIAGNOSIS — Z923 Personal history of irradiation: Secondary | ICD-10-CM | POA: Diagnosis not present

## 2021-09-14 NOTE — Progress Notes (Signed)
Radiation Oncology Follow up Note  Name: Kristina Ellis   Date:   09/14/2021 MRN:  [id number] DOB: 07-25-1951    This 71 y.o. 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  Patient was not a surgical candidate based on comorbidities.  Seen today in routine follow-up she continues to have some diarrhea intermittent she is occasionally using Imodium.  She does have nocturia x3 some frequency and urgency which we would expect.. She has a follow-up appointment with urology and will probably have cystoscopy scheduled in the near future COMPLICATIONS OF TREATMENT: none  FOLLOW UP COMPLIANCE: keeps appointments   PHYSICAL EXAM:  BP (!) 131/91 (BP Location: Right Arm, Patient Position: Sitting)    Pulse 72    Temp 97.7 F (36.5 C) (Tympanic)    Resp 13  Frail-appearing elderly female in NAD wheelchair-bound.  Well-developed well-nourished patient in NAD. HEENT reveals PERLA, EOMI, discs not visualized.  Oral cavity is clear. No oral mucosal lesions are identified. Neck is clear without evidence of cervical or supraclavicular adenopathy. Lungs are clear to A&P. Cardiac examination is essentially unremarkable with regular rate and rhythm without murmur rub or thrill. Abdomen is benign with no organomegaly or masses noted. Motor sensory and DTR levels are equal and symmetric in the upper and lower extremities. Cranial nerves II through XII are grossly intact. Proprioception is intact. No peripheral adenopathy or edema is identified. No motor or sensory levels are noted. Crude visual fields are within normal range.  RADIOLOGY  RESULTS: No current films for review  PLAN: Present time patient is slowly recovering from her external beam radiation therapy.  She does have some slight increased urinary tract symptoms such as frequency urgency and nocturia.  She will be seeing urology in the near future with possible repeat cystoscopy.  I have asked to see her back in 4 to 5 months for follow-up.  Patient knows to call with any concerns.  I would like to take this opportunity to thank you for allowing me to participate in the care of your patient.Noreene Filbert, MD

## 2021-09-22 DIAGNOSIS — M542 Cervicalgia: Secondary | ICD-10-CM | POA: Insufficient documentation

## 2021-09-22 DIAGNOSIS — M19012 Primary osteoarthritis, left shoulder: Secondary | ICD-10-CM | POA: Insufficient documentation

## 2021-09-22 DIAGNOSIS — M47812 Spondylosis without myelopathy or radiculopathy, cervical region: Secondary | ICD-10-CM | POA: Insufficient documentation

## 2021-09-22 DIAGNOSIS — M5412 Radiculopathy, cervical region: Secondary | ICD-10-CM | POA: Insufficient documentation

## 2021-10-01 ENCOUNTER — Encounter: Payer: Self-pay | Admitting: Urology

## 2021-10-01 ENCOUNTER — Other Ambulatory Visit: Payer: Self-pay

## 2021-10-01 ENCOUNTER — Ambulatory Visit (INDEPENDENT_AMBULATORY_CARE_PROVIDER_SITE_OTHER): Payer: Medicare Other | Admitting: Urology

## 2021-10-01 VITALS — BP 136/91 | HR 64 | Ht 59.0 in | Wt 138.0 lb

## 2021-10-01 DIAGNOSIS — C679 Malignant neoplasm of bladder, unspecified: Secondary | ICD-10-CM

## 2021-10-01 DIAGNOSIS — R310 Gross hematuria: Secondary | ICD-10-CM

## 2021-10-01 LAB — URINALYSIS, COMPLETE
Bilirubin, UA: NEGATIVE
Glucose, UA: NEGATIVE
Nitrite, UA: NEGATIVE
Specific Gravity, UA: 1.025 (ref 1.005–1.030)
Urobilinogen, Ur: 2 mg/dL — ABNORMAL HIGH (ref 0.2–1.0)
pH, UA: 6.5 (ref 5.0–7.5)

## 2021-10-01 LAB — MICROSCOPIC EXAMINATION: RBC, Urine: NONE SEEN /hpf (ref 0–2)

## 2021-10-01 NOTE — Progress Notes (Signed)
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  She refused referral to consider cystectomy and urinary diversion, and opted for tri-modal therapy with chemotherapy(cisplatin x4) and radiation(XRT completed January 2023).  PET scan in November showed no evidence of metastatic disease.  She is overall doing well and denies significant urinary complaints today, has some occasional dysuria that seems to be improving  After informed consent and discussion of the procedure and its risks, Kristina Ellis was positioned and prepped in the standard fashion. Cystoscopy was performed with a flexible cystoscope. The urethra, bladder neck and entire bladder was visualized in a standard fashion. There was a large scar at the right lateral bladder wall, but no evidence of abnormal tissue or recurrence.  The ureteral orifices were visualized in their normal location and orientation.  Cytology sent  Findings: No evidence of recurrence   Assessment and Plan: -Will call with urine cytology-> if negative or atypical continue close surveillance every 3 months for the first 2 years -Follow-up urine culture -Contacted Dr. Grayland Ormond with results from today, and recommendations for repeat staging imaging within the next few months  Nickolas Madrid, MD 10/01/2021

## 2021-10-02 LAB — CYTOLOGY - NON PAP

## 2021-10-02 NOTE — Progress Notes (Signed)
[place]  [phone] Fax:([phone]  ID: Kristina Ellis OB: 06/17/1951  MR#: [id number]  GEX#:[id number]  Patient Care Team: [facility] [place] as PCP - General (General Practice) Lloyd Huger, MD as Consulting Physician (Hematology and Oncology)  CHIEF COMPLAINT: Stage II bladder cancer.  INTERVAL HISTORY: Patient returns to clinic today for repeat laboratory work and further evaluation.  She recently underwent cystoscopy that revealed no evidence of disease.  She continues to have left arm discomfort from her fall recently, but otherwise feels well.  She continues to have chronic weakness and fatigue.  She denies any other pain.  She has no neurologic complaints.  She denies any recent fevers or illnesses.  She has a good appetite and denies weight loss.  She has no chest pain, shortness of breath, cough, or hemoptysis.  She denies any nausea, vomiting, constipation, or diarrhea.  She has no urinary complaints.  Patient offers no further specific complaints today.  REVIEW OF SYSTEMS:   Review of Systems  Constitutional:  Positive for malaise/fatigue. Negative for fever and weight loss.  Respiratory: Negative.  Negative for cough, hemoptysis and shortness of breath.   Cardiovascular: Negative.  Negative for chest pain and leg swelling.  Gastrointestinal: Negative.  Negative for abdominal pain.  Genitourinary:  Negative for hematuria.  Musculoskeletal:  Positive for joint pain. Negative for back pain and falls.  Skin: Negative.  Negative for rash.  Neurological:  Positive for weakness. Negative for dizziness, seizures and headaches.  Psychiatric/Behavioral: Negative.  The patient is not nervous/anxious.    As per HPI. Otherwise, a complete review of systems is negative.  PAST MEDICAL HISTORY: Past Medical History:  Diagnosis Date   Aortic atherosclerosis ([place])    Arthritis    Atrial fibrillation ([place])    a.) s/p TEE  with cardioversion on 02/04/2020. b.) on daily apixaban.   Bladder mass    a.) CT 04/08/2021 --> 3.2 cm intraluminal bladder mass.   Carotid stenosis, bilateral    a.) Doppler 07/04/2020 --> mild; 1-49% stenosis BILATERALLY.   Cholelithiasis    a.) CT 11/06/2020 --> largest measured 4.5 cm   Chronic anticoagulation    a.) Apixaban   Common biliary duct calculus    a.) CT 04/08/2021 --> CBD dilated at 8 mm; 51m calculus within distal CBD.   Coronary artery disease involving native coronary artery of native heart without angina pectoris 50/05/2724  Diastolic dysfunction    a.) TTE 07/04/2020 --> LVEF 60-65%; G1DD.   Hepatic cirrhosis (H[facility])    Hepatic steatosis    Hepatitis 1970   History of 2019 novel coronavirus disease (COVID-19) 12/20/2019   History of marijuana use    Hypertension    Hypertensive retinopathy    IDA (iron deficiency anemia)    Mitral stenosis    a.) TEE 04/10/2020 --> mild. b.) TTE 07/04/2020 --> EF 60-65%; mild (mean gradient 5 mmHg).   Moderate pulmonary arterial systolic hypertension ([place] 12/22/2019   Nephrolithiasis    [facility] (non-ST elevated [facility] infarction) [facility] 07/26/2016   Open-angle glaucoma 09/11/2010   PAH (pulmonary artery hypertension) ([facility]    a.) TTE 12/21/2019 --> PASP 44 mmHg.   Uterine fibroid    a.) CT 04/08/2021 --> multiple with largest measuring 7 cm.   Valvular regurgitation    a.) TTE 09/01/2010 --> trivial to mild pan-valvular. b.) TTE 12/21/2019 --> mild MR and AR; moderate TR. c.) TTE 07/04/2020 --> mild TR; trivial MR and PR.  Vestibular neuronitis     PAST SURGICAL HISTORY: Past Surgical History:  Procedure Laterality Date   BREAST CYST ASPIRATION Left    COLONOSCOPY  2012   TEE WITH CARDIOVERSION N/A 02/04/2020   Procedure: TEE WITH CARDIOVERSION; Location: [facility]; Surgeon: Kandis Cocking, MD   TRANSURETHRAL RESECTION OF BLADDER TUMOR N/A 05/15/2021   Procedure: TRANSURETHRAL RESECTION OF BLADDER TUMOR (TURBT);  Surgeon:  Billey Co, MD;  Location: [facility] ORS;  Service: Urology;  Laterality: N/A;    FAMILY HISTORY: Family History  Problem Relation Age of Onset   Aneurysm Mother    Colon cancer Father    Lung cancer Brother    Breast cancer Neg Hx     ADVANCED DIRECTIVES (Y/N):  N  HEALTH MAINTENANCE: Social History   Tobacco Use   Smoking status: Former    Packs/day: 0.10    Years: 10.00    Pack years: 1.00    Types: Cigarettes   Smokeless tobacco: Never   Tobacco comments:    occasional smoker  Vaping Use   Vaping Use: Never used  Substance Use Topics   Alcohol use: Yes    Alcohol/week: 5.0 standard drinks    Types: 5 Cans of beer per week    Comment: weekly   Drug use: Not Currently    Types: 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     diclofenac (VOLTAREN) 50 MG EC tablet Take 1 tablet (50 mg total) by mouth 3 (three) times daily. 30 tablet 0   ELIQUIS 5 MG TABS tablet Take 5 mg by mouth 2 (two) times daily.     FEROSUL 325 (65 Fe) MG tablet Take 325 mg by mouth daily.     magnesium oxide (MAG-OX) 400 MG tablet Take 1 tablet by mouth 2 (two) times daily.     metoprolol succinate (TOPROL-XL) 25 MG 24 hr tablet Take 1 tablet (25 mg total) by mouth daily. Take with or immediately following a meal. 30 tablet 0   oxyCODONE-acetaminophen (PERCOCET/ROXICET) 5-325 MG tablet Take 1 tablet by mouth every 6 (six) hours as needed for moderate pain or severe pain. 30 tablet 0   potassium chloride SA (KLOR-CON M) 20 MEQ tablet Take 1 tablet (20 mEq total) by mouth 2 (two) times daily. 60 tablet 2   trolamine salicylate (ASPERCREME) 10 % cream Apply 1 application topically as needed for muscle  pain.     No current facility-administered medications for this visit.    OBJECTIVE: Vitals:   10/08/21 1000  BP: (!) 155/99  Pulse: 61  Temp: 97.9 F (36.6 C)     There is no height or weight on file to calculate BMI.    ECOG FS:1 - Symptomatic but completely ambulatory  General: Well-developed, well-nourished, no acute distress.  Sitting in a wheelchair. Eyes: Pink conjunctiva, anicteric sclera. HEENT: Normocephalic, moist mucous membranes. Lungs: No audible wheezing or coughing. Heart: Regular rate and rhythm. Abdomen: Soft, nontender, no obvious distention. Musculoskeletal: No edema, cyanosis, or clubbing. Neuro: Alert, answering all questions appropriately. Cranial nerves grossly intact. Skin: No rashes or petechiae noted. Psych: Normal affect.   LAB RESULTS:  Lab Results  Component Value Date   NA 138 10/08/2021   K 2.9 (L) 10/08/2021   CL 107 10/08/2021   CO2 19 (L) 10/08/2021   GLUCOSE 90 10/08/2021   BUN 7 (L) 10/08/2021   CREATININE 0.67 10/08/2021   CALCIUM 8.4 (L) 10/08/2021   PROT 5.8 (L) 08/10/2021   ALBUMIN 2.2 (L) 08/10/2021   AST 35 08/10/2021   ALT 23 08/10/2021   ALKPHOS 43 08/10/2021   BILITOT 1.5 (H) 08/10/2021   GFRNONAA >60 10/08/2021   GFRAA >60 03/07/2018    Lab Results  Component Value Date   WBC 1.4 (LL) 10/08/2021   NEUTROABS 0.7 (L) 10/08/2021   HGB 11.6 (L) 10/08/2021   HCT 33.3 (L) 10/08/2021   MCV 106.7 (H) 10/08/2021   PLT 89 (L) 10/08/2021     STUDIES: No results found.  ASSESSMENT: Stage II bladder cancer.  PLAN:    Stage II bladder cancer: Pathology and imaging reviewed independently.  PET scan results from June 18, 2021 reviewed independently with no specific findings of active malignancy.  Since patient is not a surgical candidate she underwent TURBT.  Patient did not have port placement.   Patient received her fourth and last dose of cisplatin on August 04, 2021.  She subsequently completed XRT on August 19, 2021.  Cystoscopy on October 01, 2021 did not reveal any evidence of malignancy.  No intervention is needed at this time.  Repeat CT scan of abdomen and pelvis in mid May with follow-up 1 to 2 days later.  Patient's next cystoscopy is scheduled at the end of May.   Hypomagnesia: Chronic and unchanged.  Patient declined IV magnesium today.  Continue oral magnesium supplementation.   Neutropenia: Patient's total white blood cell count is 1.4 with an ANC of 0.7.  Unclear etiology.  Can consider bone marrow biopsy in the future to further evaluate. Anemia: Hemoglobin improving and trending up to 11.6.  Monitor.   Thrombocytopenia: Platelet count has trended down to 89.  Consider bone marrow biopsy as above.   Fall/shoulder injury: Follow-up with orthopedics as directed. Hypokalemia: Patient has refused IV potassium today.  She was given a prescription for oral potassium supplementation.   Patient expressed understanding and was in agreement with this plan. She also understands that She can call clinic at any time with any questions, concerns, or complaints.    Cancer Staging  Bladder cancer [facility]) Staging form: Urinary Bladder, AJCC 8th Edition - Clinical stage from 06/04/2021: Stage II (cT2, cN0, cM0) - Signed by Lloyd Huger, MD on 06/04/2021 WHO/ISUP grade (low/high): High Grade Histologic grading system: 2 grade system  Lloyd Huger, MD   10/08/2021 10:49 AM

## 2021-10-04 LAB — CULTURE, URINE COMPREHENSIVE

## 2021-10-05 ENCOUNTER — Telehealth: Payer: Self-pay

## 2021-10-05 NOTE — Telephone Encounter (Signed)
Called pt informed her of the information below. Pt gave verbal understanding, cysto previously scheduled.

## 2021-10-05 NOTE — Telephone Encounter (Signed)
-----   Message from Billey Co, MD sent at 10/02/2021  1:39 PM EST ----- Good news, no cancer cells on urine cytology, please schedule clinic cystoscopy in 3 months, and she should keep follow-up with oncology  Nickolas Madrid, MD 10/02/2021

## 2021-10-08 ENCOUNTER — Encounter: Payer: Self-pay | Admitting: Oncology

## 2021-10-08 ENCOUNTER — Other Ambulatory Visit: Payer: Self-pay

## 2021-10-08 ENCOUNTER — Inpatient Hospital Stay: Payer: Medicare Other

## 2021-10-08 ENCOUNTER — Inpatient Hospital Stay: Payer: Medicare Other | Attending: Oncology

## 2021-10-08 ENCOUNTER — Inpatient Hospital Stay (HOSPITAL_BASED_OUTPATIENT_CLINIC_OR_DEPARTMENT_OTHER): Payer: Medicare Other | Admitting: Oncology

## 2021-10-08 VITALS — BP 155/99 | HR 61 | Temp 97.9°F

## 2021-10-08 DIAGNOSIS — D649 Anemia, unspecified: Secondary | ICD-10-CM | POA: Diagnosis not present

## 2021-10-08 DIAGNOSIS — Z800 Family history of malignant neoplasm of digestive organs: Secondary | ICD-10-CM | POA: Insufficient documentation

## 2021-10-08 DIAGNOSIS — E876 Hypokalemia: Secondary | ICD-10-CM | POA: Diagnosis not present

## 2021-10-08 DIAGNOSIS — Z801 Family history of malignant neoplasm of trachea, bronchus and lung: Secondary | ICD-10-CM | POA: Insufficient documentation

## 2021-10-08 DIAGNOSIS — I10 Essential (primary) hypertension: Secondary | ICD-10-CM | POA: Diagnosis not present

## 2021-10-08 DIAGNOSIS — Z87891 Personal history of nicotine dependence: Secondary | ICD-10-CM | POA: Insufficient documentation

## 2021-10-08 DIAGNOSIS — C678 Malignant neoplasm of overlapping sites of bladder: Secondary | ICD-10-CM | POA: Diagnosis not present

## 2021-10-08 DIAGNOSIS — Z8551 Personal history of malignant neoplasm of bladder: Secondary | ICD-10-CM | POA: Diagnosis not present

## 2021-10-08 LAB — CBC WITH DIFFERENTIAL/PLATELET
Abs Immature Granulocytes: 0 10*3/uL (ref 0.00–0.07)
Basophils Absolute: 0 10*3/uL (ref 0.0–0.1)
Basophils Relative: 1 %
Eosinophils Absolute: 0.1 10*3/uL (ref 0.0–0.5)
Eosinophils Relative: 4 %
HCT: 33.3 % — ABNORMAL LOW (ref 36.0–46.0)
Hemoglobin: 11.6 g/dL — ABNORMAL LOW (ref 12.0–15.0)
Immature Granulocytes: 0 %
Lymphocytes Relative: 34 %
Lymphs Abs: 0.5 10*3/uL — ABNORMAL LOW (ref 0.7–4.0)
MCH: 37.2 pg — ABNORMAL HIGH (ref 26.0–34.0)
MCHC: 34.8 g/dL (ref 30.0–36.0)
MCV: 106.7 fL — ABNORMAL HIGH (ref 80.0–100.0)
Monocytes Absolute: 0.1 10*3/uL (ref 0.1–1.0)
Monocytes Relative: 8 %
Neutro Abs: 0.7 10*3/uL — ABNORMAL LOW (ref 1.7–7.7)
Neutrophils Relative %: 53 %
Platelets: 89 10*3/uL — ABNORMAL LOW (ref 150–400)
RBC: 3.12 MIL/uL — ABNORMAL LOW (ref 3.87–5.11)
RDW: 11.8 % (ref 11.5–15.5)
WBC: 1.4 10*3/uL — CL (ref 4.0–10.5)
nRBC: 0 % (ref 0.0–0.2)

## 2021-10-08 LAB — BASIC METABOLIC PANEL
Anion gap: 12 (ref 5–15)
BUN: 7 mg/dL — ABNORMAL LOW (ref 8–23)
CO2: 19 mmol/L — ABNORMAL LOW (ref 22–32)
Calcium: 8.4 mg/dL — ABNORMAL LOW (ref 8.9–10.3)
Chloride: 107 mmol/L (ref 98–111)
Creatinine, Ser: 0.67 mg/dL (ref 0.44–1.00)
GFR, Estimated: >60 mL/min (ref >60)
Glucose, Bld: 90 mg/dL (ref 70–99)
Potassium: 2.9 mmol/L — ABNORMAL LOW (ref 3.5–5.1)
Sodium: 138 mmol/L (ref 135–145)

## 2021-10-08 LAB — MAGNESIUM: Magnesium: 1.3 mg/dL — ABNORMAL LOW (ref 1.7–2.4)

## 2021-10-08 MED ORDER — POTASSIUM CHLORIDE CRYS ER 20 MEQ PO TBCR: 20.0000 meq | EXTENDED_RELEASE_TABLET | Freq: Two times a day (BID) | ORAL | 2 refills | Status: DC

## 2021-12-17 ENCOUNTER — Encounter: Payer: Self-pay | Admitting: Emergency Medicine

## 2021-12-17 ENCOUNTER — Other Ambulatory Visit: Payer: Self-pay

## 2021-12-17 ENCOUNTER — Inpatient Hospital Stay: Payer: Medicare Other

## 2021-12-17 ENCOUNTER — Inpatient Hospital Stay
Admission: EM | Admit: 2021-12-17 | Discharge: 2021-12-22 | DRG: 872 | Disposition: A | Discharge status: Home Health | Payer: Medicare Other | Attending: Internal Medicine | Admitting: Internal Medicine

## 2021-12-17 DIAGNOSIS — H4010X0 Unspecified open-angle glaucoma, stage unspecified: Secondary | ICD-10-CM | POA: Diagnosis present

## 2021-12-17 DIAGNOSIS — E876 Hypokalemia: Secondary | ICD-10-CM | POA: Diagnosis present

## 2021-12-17 DIAGNOSIS — R188 Other ascites: Secondary | ICD-10-CM | POA: Diagnosis present

## 2021-12-17 DIAGNOSIS — K8062 Calculus of gallbladder and bile duct with acute cholecystitis without obstruction: Secondary | ICD-10-CM | POA: Diagnosis present

## 2021-12-17 DIAGNOSIS — Z8551 Personal history of malignant neoplasm of bladder: Secondary | ICD-10-CM

## 2021-12-17 DIAGNOSIS — E872 Acidosis, unspecified: Secondary | ICD-10-CM | POA: Diagnosis present

## 2021-12-17 DIAGNOSIS — I10 Essential (primary) hypertension: Secondary | ICD-10-CM | POA: Diagnosis present

## 2021-12-17 DIAGNOSIS — N390 Urinary tract infection, site not specified: Secondary | ICD-10-CM | POA: Diagnosis present

## 2021-12-17 DIAGNOSIS — K7460 Unspecified cirrhosis of liver: Secondary | ICD-10-CM | POA: Diagnosis present

## 2021-12-17 DIAGNOSIS — K810 Acute cholecystitis: Secondary | ICD-10-CM

## 2021-12-17 DIAGNOSIS — C679 Malignant neoplasm of bladder, unspecified: Secondary | ICD-10-CM | POA: Diagnosis present

## 2021-12-17 DIAGNOSIS — K315 Obstruction of duodenum: Secondary | ICD-10-CM | POA: Diagnosis present

## 2021-12-17 DIAGNOSIS — K819 Cholecystitis, unspecified: Secondary | ICD-10-CM | POA: Diagnosis present

## 2021-12-17 DIAGNOSIS — I2510 Atherosclerotic heart disease of native coronary artery without angina pectoris: Secondary | ICD-10-CM | POA: Diagnosis present

## 2021-12-17 DIAGNOSIS — I252 Old myocardial infarction: Secondary | ICD-10-CM

## 2021-12-17 DIAGNOSIS — K8020 Calculus of gallbladder without cholecystitis without obstruction: Secondary | ICD-10-CM

## 2021-12-17 DIAGNOSIS — K8050 Calculus of bile duct without cholangitis or cholecystitis without obstruction: Secondary | ICD-10-CM | POA: Diagnosis not present

## 2021-12-17 DIAGNOSIS — Z880 Allergy status to penicillin: Secondary | ICD-10-CM | POA: Diagnosis not present

## 2021-12-17 DIAGNOSIS — Z87891 Personal history of nicotine dependence: Secondary | ICD-10-CM | POA: Diagnosis not present

## 2021-12-17 DIAGNOSIS — Z923 Personal history of irradiation: Secondary | ICD-10-CM

## 2021-12-17 DIAGNOSIS — Z79899 Other long term (current) drug therapy: Secondary | ICD-10-CM

## 2021-12-17 DIAGNOSIS — Z9221 Personal history of antineoplastic chemotherapy: Secondary | ICD-10-CM

## 2021-12-17 DIAGNOSIS — Z8616 Personal history of COVID-19: Secondary | ICD-10-CM

## 2021-12-17 DIAGNOSIS — Z888 Allergy status to other drugs, medicaments and biological substances status: Secondary | ICD-10-CM | POA: Diagnosis not present

## 2021-12-17 DIAGNOSIS — I4891 Unspecified atrial fibrillation: Secondary | ICD-10-CM | POA: Diagnosis present

## 2021-12-17 DIAGNOSIS — A419 Sepsis, unspecified organism: Secondary | ICD-10-CM | POA: Diagnosis present

## 2021-12-17 DIAGNOSIS — K859 Acute pancreatitis without necrosis or infection, unspecified: Secondary | ICD-10-CM | POA: Diagnosis not present

## 2021-12-17 DIAGNOSIS — K9189 Other postprocedural complications and disorders of digestive system: Secondary | ICD-10-CM | POA: Diagnosis not present

## 2021-12-17 DIAGNOSIS — Z7901 Long term (current) use of anticoagulants: Secondary | ICD-10-CM

## 2021-12-17 DIAGNOSIS — I2721 Secondary pulmonary arterial hypertension: Secondary | ICD-10-CM | POA: Diagnosis present

## 2021-12-17 LAB — URINALYSIS, ROUTINE W REFLEX MICROSCOPIC
Glucose, UA: NEGATIVE mg/dL
Ketones, ur: NEGATIVE mg/dL
Nitrite: NEGATIVE
Protein, ur: 30 mg/dL — AB
Specific Gravity, Urine: 1.02 (ref 1.005–1.030)
WBC, UA: >50 WBC/hpf — ABNORMAL HIGH (ref 0–5)
pH: 6 (ref 5.0–8.0)

## 2021-12-17 LAB — COMPREHENSIVE METABOLIC PANEL
ALT: 28 U/L (ref 0–44)
AST: 46 U/L — ABNORMAL HIGH (ref 15–41)
Albumin: 2.9 g/dL — ABNORMAL LOW (ref 3.5–5.0)
Alkaline Phosphatase: 49 U/L (ref 38–126)
Anion gap: 12 (ref 5–15)
BUN: 17 mg/dL (ref 8–23)
CO2: 22 mmol/L (ref 22–32)
Calcium: 8.1 mg/dL — ABNORMAL LOW (ref 8.9–10.3)
Chloride: 102 mmol/L (ref 98–111)
Creatinine, Ser: 1.1 mg/dL — ABNORMAL HIGH (ref 0.44–1.00)
GFR, Estimated: 54 mL/min — ABNORMAL LOW (ref >60)
Glucose, Bld: 102 mg/dL — ABNORMAL HIGH (ref 70–99)
Potassium: 3 mmol/L — ABNORMAL LOW (ref 3.5–5.1)
Sodium: 136 mmol/L (ref 135–145)
Total Bilirubin: 3.2 mg/dL — ABNORMAL HIGH (ref 0.3–1.2)
Total Protein: 8.1 g/dL (ref 6.5–8.1)

## 2021-12-17 LAB — CBC
HCT: 42.9 % (ref 36.0–46.0)
Hemoglobin: 14.3 g/dL (ref 12.0–15.0)
MCH: 35.5 pg — ABNORMAL HIGH (ref 26.0–34.0)
MCHC: 33.3 g/dL (ref 30.0–36.0)
MCV: 106.5 fL — ABNORMAL HIGH (ref 80.0–100.0)
Platelets: 176 10*3/uL (ref 150–400)
RBC: 4.03 MIL/uL (ref 3.87–5.11)
RDW: 13.4 % (ref 11.5–15.5)
WBC: 9 10*3/uL (ref 4.0–10.5)
nRBC: 0 % (ref 0.0–0.2)

## 2021-12-17 LAB — LACTIC ACID, PLASMA
Lactic Acid, Venous: 3.7 mmol/L (ref 0.5–1.9)
Lactic Acid, Venous: 1.6 mmol/L (ref 0.5–1.9)
Lactic Acid, Venous: 1.8 mmol/L (ref 0.5–1.9)

## 2021-12-17 LAB — LIPASE, BLOOD: Lipase: 28 U/L (ref 11–51)

## 2021-12-17 LAB — MAGNESIUM: Magnesium: 1.5 mg/dL — ABNORMAL LOW (ref 1.7–2.4)

## 2021-12-17 LAB — PROTIME-INR
INR: 1.3 — ABNORMAL HIGH (ref 0.8–1.2)
Prothrombin Time: 15.8 seconds — ABNORMAL HIGH (ref 11.4–15.2)

## 2021-12-17 MED ORDER — LACTATED RINGERS IV BOLUS
1000.0000 mL | Freq: Once | INTRAVENOUS | Status: AC
  Administered 2021-12-17: 1000 mL via INTRAVENOUS

## 2021-12-17 MED ORDER — SODIUM CHLORIDE 0.9 % IV BOLUS: 500.0000 mL | Freq: Once | INTRAVENOUS | Status: DC

## 2021-12-17 MED ORDER — METRONIDAZOLE 500 MG/100ML IV SOLN
500.0000 mg | Freq: Once | INTRAVENOUS | Status: DC
  Filled 2021-12-17: qty 100

## 2021-12-17 MED ORDER — PIPERACILLIN-TAZOBACTAM 3.375 G IVPB
3.3750 g | Freq: Three times a day (TID) | INTRAVENOUS | Status: DC
  Administered 2021-12-17 – 2021-12-22 (×14): 3.375 g via INTRAVENOUS
  Filled 2021-12-17 (×13): qty 50

## 2021-12-17 MED ORDER — MORPHINE SULFATE (PF) 2 MG/ML IV SOLN
1.0000 mg | INTRAVENOUS | Status: DC | PRN
  Administered 2021-12-17 – 2021-12-22 (×14): 1 mg via INTRAVENOUS
  Filled 2021-12-17 (×15): qty 1

## 2021-12-17 MED ORDER — DILTIAZEM HCL-DEXTROSE 125-5 MG/125ML-% IV SOLN (PREMIX)
5.0000 mg/h | INTRAVENOUS | Status: DC
  Administered 2021-12-17: 5 mg/h via INTRAVENOUS
  Administered 2021-12-18: 7.5 mg/h via INTRAVENOUS
  Administered 2021-12-19 – 2021-12-21 (×3): 5 mg/h via INTRAVENOUS
  Filled 2021-12-17 (×5): qty 125

## 2021-12-17 MED ORDER — POTASSIUM CHLORIDE 2 MEQ/ML IV SOLN
INTRAVENOUS | Status: DC
  Filled 2021-12-17 (×10): qty 1000

## 2021-12-17 MED ORDER — ONDANSETRON HCL 4 MG PO TABS: 4.0000 mg | ORAL_TABLET | Freq: Four times a day (QID) | ORAL | Status: DC | PRN

## 2021-12-17 MED ORDER — ORAL CARE MOUTH RINSE
15.0000 mL | Freq: Two times a day (BID) | OROMUCOSAL | Status: DC
  Administered 2021-12-18 – 2021-12-22 (×8): 15 mL via OROMUCOSAL

## 2021-12-17 MED ORDER — DILTIAZEM LOAD VIA INFUSION
10.0000 mg | Freq: Once | INTRAVENOUS | Status: AC
Start: 2021-12-17 — End: 2021-12-17
  Administered 2021-12-17: 10 mg via INTRAVENOUS
  Filled 2021-12-17: qty 10

## 2021-12-17 MED ORDER — IOHEXOL 300 MG/ML  SOLN
100.0000 mL | Freq: Once | INTRAMUSCULAR | Status: AC | PRN
  Administered 2021-12-17: 100 mL via INTRAVENOUS

## 2021-12-17 MED ORDER — MAGNESIUM OXIDE 400 MG PO TABS
400.0000 mg | ORAL_TABLET | Freq: Two times a day (BID) | ORAL | Status: DC
  Administered 2021-12-17 – 2021-12-22 (×11): 400 mg via ORAL
  Filled 2021-12-17 (×21): qty 1

## 2021-12-17 MED ORDER — ONDANSETRON HCL 4 MG/2ML IJ SOLN: 4.0000 mg | Freq: Four times a day (QID) | INTRAMUSCULAR | Status: DC | PRN

## 2021-12-17 MED ORDER — SODIUM CHLORIDE 0.9 % IV SOLN
1.0000 g | Freq: Once | INTRAVENOUS | Status: AC
  Administered 2021-12-17: 1 g via INTRAVENOUS
  Filled 2021-12-17: qty 10

## 2021-12-17 MED ORDER — PIPERACILLIN-TAZOBACTAM 3.375 G IVPB 30 MIN
3.3750 g | Freq: Four times a day (QID) | INTRAVENOUS | Status: DC
Start: 2021-12-17 — End: 2021-12-17

## 2021-12-17 MED ORDER — METOPROLOL SUCCINATE ER 25 MG PO TB24
25.0000 mg | ORAL_TABLET | Freq: Every day | ORAL | Status: DC
  Administered 2021-12-17 – 2021-12-19 (×3): 25 mg via ORAL
  Filled 2021-12-17 (×3): qty 1

## 2021-12-17 MED ORDER — MAGNESIUM SULFATE 2 GM/50ML IV SOLN
2.0000 g | Freq: Once | INTRAVENOUS | Status: DC
Start: 2021-12-17 — End: 2021-12-20

## 2021-12-17 MED ORDER — SODIUM CHLORIDE 0.9 % IV BOLUS: 1000.0000 mL | Freq: Once | INTRAVENOUS | Status: DC

## 2021-12-17 MED ORDER — SODIUM CHLORIDE 0.9 % IV BOLUS
500.0000 mL | Freq: Once | INTRAVENOUS | Status: AC
  Administered 2021-12-17: 500 mL via INTRAVENOUS

## 2021-12-17 MED ORDER — METOPROLOL TARTRATE 5 MG/5ML IV SOLN
2.5000 mg | INTRAVENOUS | Status: AC
  Administered 2021-12-17: 2.5 mg via INTRAVENOUS
  Filled 2021-12-17: qty 5

## 2021-12-17 NOTE — Consult Note (Signed)
CODE SEPSIS - PHARMACY COMMUNICATION ? ?**Broad Spectrum Antibiotics should be administered within 1 hour of Sepsis diagnosis** ? ?Time Code Sepsis Called/Page Received: 1123 ? ?Antibiotics Ordered: ceftriaxone ? ?Time of 1st antibiotic administration: 1024 ? ?Additional action taken by pharmacy: n/a ? ?If necessary, Name of Provider/Nurse Contacted: n/a ? ? ?Sosie Gato Rodriguez-Guzman PharmD, BCPS ?12/17/2021 11:38 AM ? ?

## 2021-12-17 NOTE — Assessment & Plan Note (Addendum)
As evidenced by tachycardia, tachypnea, lactic acidosis and imaging suggestive of acute cholecystitis as well as pyuria suggestive of UTI ?Aggressive IV fluid resuscitation ?Start patient empirically on IV Zosyn ?Follow-up results of blood and urine cultures ?Trend lactic acid levels ?Surgery was consulted and they recommended IR consult for percutaneous cholecystostomy drain. ?IR has been consulted as well as gastroenterology for findings of choledocholithiasis on imaging ?

## 2021-12-17 NOTE — ED Notes (Signed)
Dr Jacqualine Code notified in person of pts LA 3.7 ?

## 2021-12-17 NOTE — Consult Note (Addendum)
? ? ?GI Inpatient Consult Note ? ?Reason for Consult: Acute sepsis from UTI, cholangitis and choledocholithiasis  ?  ?Attending Requesting Consult: Dr. Francine Graven ? ?History of Present Illness: ?Kristina Ellis is a 71 y.o. female seen for evaluation of acute sepsis from UTI, cholangitis, choledocholithiasis at the request of Dr. Francine Graven. Patient has a PMH of atrial fib status post TEE with conversion, CAD, history of bladder cancer status post transurethral resection of the tumor, chemotherapy and radiation therapy, steatohepatitis, cirrhosis, cholelithiasis, remote hepatitis (treated 1970s), hypertension.  Patient presented to the [facility] ED today for chief complaint of abdominal pain onset Monday, associated with nausea, vomiting, loose stools, chills, fatigue. She has right lower quadrant abdominal pain radiating to the back. Upon presentation to the ED, vital signs were 55/112, 141, 24, SPO2 100%. Labs were significant for lactic acid 3.7, potassium 3.0, magnesium 1.5, AST 46, ALT 28, total bilirubin 3.2, INR 1.3, BUN 15, Cr 1.10. Imaging studies revealed hepatic cirrhosis with steatosis with trace abdominopelvic trace ascites. Dilated gallbladder with marked gallbladder wall thickening and large intraluminal stone measuring up to 5.3 cm. Tiny stones in the distal common bile duct, similar to prior exams, with mild upstream biliary ductal prominence.  ? ?Last Colonoscopy: remote >10 years ago ?Last Endoscopy: unknown ? ?She has noted loose stool this month x4.  Patient does have postprandial urgency after eating foods at [facility] and [employer].  On Sunday, she ate at [facility] chili, and had a urgent need for bowel movement while driving and could not get to a bathroom soon enough.  She had fecal incontinence. On Monday, she developed bilateral lower abdominal pain, and UTI symptoms, fatigue and not well.  She had pain in diffuse lower abdomen all Monday, Tuesday  and was able to get out of bed yesterday, but did not  feel well. Today, her abdominal pain was worse and she had vomiting this morning.  She did not look at the emesis. She last had Boost yesterday.  No food today.  She has noted no jaundice, tea colored urine, or white chalky stools.  She has had decreased appetite over the last 2 weeks. Her mother recently passed away 2 weeks ago and she has felt stressed and depressed.  She reports that she has been drinking more liquor in this time frame.  She has had some chills no fevers.  She has noted no jaundice, or tea colored urine.  No history of pacemaker, hip replacement, abdominal surgery. She reports she was hospitalized for 2 weeks in the 1970s for some type of hepatitis.  Reports she has never been told she has cirrhosis, gallstones, or liver disease. ? ?FH: Father diagnosed with colon cancer age 44 deceased 56.  Brother diagnosed colon cancer age 18 deceased age 34.  To her knowledge there is no liver disease or malignancy. ? ? ?Past Medical History:  ?Past Medical History:  ?Diagnosis Date  ? Aortic atherosclerosis ([place])   ? Arthritis   ? Atrial fibrillation ([place])   ? a.) s/p TEE with cardioversion on 02/04/2020. b.) on daily apixaban.  ? Bladder mass   ? a.) CT 04/08/2021 --> 3.2 cm intraluminal bladder mass.  ? Carotid stenosis, bilateral   ? a.) Doppler 07/04/2020 --> mild; 1-49% stenosis BILATERALLY.  ? Cholelithiasis   ? a.) CT 11/06/2020 --> largest measured 4.5 cm  ? Chronic anticoagulation   ? a.) Apixaban  ? Common biliary duct calculus   ? a.) CT 04/08/2021 --> CBD dilated at 8 mm;  18m calculus within distal CBD.  ? Coronary artery disease involving native coronary artery of native heart without angina pectoris 12/21/2019  ? Diastolic dysfunction   ? a.) TTE 07/04/2020 --> LVEF 60-65%; G1DD.  ? Hepatic cirrhosis ([place]   ? Hepatic steatosis   ? Hepatitis 1970  ? History of 2019 novel coronavirus disease (COVID-19) 12/20/2019  ? History of marijuana use   ? Hypertension   ? Hypertensive retinopathy   ? IDA  (iron deficiency anemia)   ? Mitral stenosis   ? a.) TEE 04/10/2020 --> mild. b.) TTE 07/04/2020 --> EF 60-65%; mild (mean gradient 5 mmHg).  ? Moderate pulmonary arterial systolic hypertension ([place] 12/22/2019  ? Nephrolithiasis   ? NSTEMI (non-ST elevated myocardial infarction) ([facility] 07/26/2016  ? Open-angle glaucoma 09/11/2010  ? PAH (pulmonary artery hypertension) ([place]   ? a.) TTE 12/21/2019 --> PASP 44 mmHg.  ? Uterine fibroid   ? a.) CT 04/08/2021 --> multiple with largest measuring 7 cm.  ? Valvular regurgitation   ? a.) TTE 09/01/2010 --> trivial to mild pan-valvular. b.) TTE 12/21/2019 --> mild MR and AR; moderate TR. c.) TTE 07/04/2020 --> mild TR; trivial MR and PR.  ? Vestibular neuronitis   ?  ?Problem List: ?Patient Active Problem List  ? Diagnosis Date Noted  ? Sepsis ([place] 12/17/2021  ? Acute cholecystitis 12/17/2021  ? UTI (urinary tract infection) 12/17/2021  ? Hypotension 08/09/2021  ? Lactic acidosis 08/09/2021  ? Elevated troponin level not due myocardial infarction 08/09/2021  ? Acute pain of left shoulder 08/09/2021  ? Hypokalemia 08/09/2021  ? Bladder cancer ([facility] 05/29/2021  ? Atrial fibrillation with RVR ([facility] 07/03/2020  ? Moderate pulmonary arterial systolic hypertension ([place] 12/22/2019  ? Severe tricuspid regurgitation 12/22/2019  ? Coronary artery disease involving native coronary artery of native heart without angina pectoris 12/21/2019  ? NSTEMI (non-ST elevated myocardial infarction) ([facility] 07/26/2016  ? Open-angle glaucoma 09/11/2010  ?  ?Past Surgical History: ?Past Surgical History:  ?Procedure Laterality Date  ? BREAST CYST ASPIRATION Left   ? COLONOSCOPY  2012  ? TEE WITH CARDIOVERSION N/A 02/04/2020  ? Procedure: TEE WITH CARDIOVERSION; Location: [facility]; Surgeon: CKandis Cocking MD  ? TRANSURETHRAL RESECTION OF BLADDER TUMOR N/A 05/15/2021  ? Procedure: TRANSURETHRAL RESECTION OF BLADDER TUMOR (TURBT);  Surgeon: SBilley Co MD;  Location: [facility] OR[facility];  Service: Urology;  Laterality:  N/A;  ?  ?Allergies: ?Allergies  ?Allergen Reactions  ? Atenolol Other (See Comments)  ?  bradycardia ?bradycardia ?  ? Penicillins Rash  ?  ?Home Medications: ?(Not in a hospital admission) ? ?Home medication reconciliation was completed with the patient.  ? ?Scheduled Inpatient Medications: ?  ? magnesium oxide  400 mg Oral BID  ? metoprolol succinate  25 mg Oral Daily  ? ? ?Continuous Inpatient Infusions: ?  ? lactated ringers with kcl    ? metronidazole    ? sodium chloride    ? ? ?PRN Inpatient Medications:  ?morphine injection, ondansetron **OR** ondansetron (ZOFRAN) IV ? ?Family History: ?family history includes Aneurysm in her mother; Colon cancer in her father; Lung cancer in her brother.  The patient's family history is negative for inflammatory bowel disorders, GI malignancy, or solid organ transplantation. ? ?Social History:  ? reports that she has quit smoking. Her smoking use included cigarettes. She has a 1.00 pack-year smoking history. She has never used smokeless tobacco. She reports current alcohol use of about 5.0 standard drinks per week. She reports that she does not currently  use drugs after having used the following drugs: Marijuana. The patient denies ETOH, tobacco, or drug use.  ? ?Review of Systems: ?Constitutional: Weight is stable. Positive weakness ?Eyes: No changes in vision. ?ENT: No oral lesions, sore throat.  ?GI: see HPI.  ?Heme/Lymph: No easy bruising.  ?CV: No chest pain.  ?GU: No hematuria.  ?Integumentary: No rashes.  ?Neuro: No headaches.  ?Psych: No depression/anxiety.  ?Endocrine: No heat/cold intolerance.  ?Allergic/Immunologic: No urticaria.  ?Resp: Mild SOB, no cough ?Musculoskeletal: No joint swelling.  ?  ?Physical Examination: ?BP (!) 151/120   Pulse (!) 119   Temp (!) 97.4 ?F (36.3 ?C) (Oral)   Resp (!) 22   Ht '4\' 11"'$  (1.499 m)   Wt 59.4 kg   SpO2 100%   BMI 26.46 kg/m?  ?Gen: NAD, alert and oriented x 4, ?HEENT: EOMI, ?Neck: supple, no JVD or  thyromegaly ?Chest: CTA bilaterally, no wheezes, crackles, or other adventitious sounds ?CV: Irreg, tachy Afib  ?Abd: soft, mild tender RUQ and bilateral lower abdomen , ND, +BS in all four quadrants; no HSM, guarding, rigidity, o

## 2021-12-17 NOTE — ED Notes (Signed)
Patient transported to floor at this time via RN and monitor ?

## 2021-12-17 NOTE — Consult Note (Signed)
[facility] ?SURGICAL CONSULTATION NOTE (initial) - cpt: [phone] ? ? ?HISTORY OF PRESENT ILLNESS (HPI):  ?71 y.o. female presented to [facility] ED today for evaluation of abdominal pain. Patient reports that she believes this started with "an accident" she had over the weekend in which stool got into her vagina and now she has a UTI. She reports abdominal discomfort since Monday. She is unable to localize this for me and states "it is all over." Endorses associated weakness and generalized fatigue. No fever, chills, CP, SOB, nausea, emesis, or bowel changes. She certainly has scleral icterus. No previous intra-abdominal surgeries. She does have a history of Bladder CA and follows with Dr Diamantina Providence for this. Of note, she is on Eliquis for PAF. Work up in the ED revealed a WBC to 10.3K, lactic acidosis to 3.7 (now 1.6), hypokalemia to 3.0, slight AKI with sCr - 1.10, hyperbilirubinemia to 3.2, and normal lipase at 28. CT Abdomen/Pelvis was concerning for large gallstone with gallbladder wall thickening and tiny CBD stones seen as well. She was admitted to medicine service. ? ?Surgery is consulted by emergency physician Dr. Delman Kitten, MD in this context for evaluation and management of cholecystitis and choledocholithiasis. ? ? ?PAST MEDICAL HISTORY (PMH):  ?Past Medical History:  ?Diagnosis Date  ? Aortic atherosclerosis ([place])   ? Arthritis   ? Atrial fibrillation ([place])   ? a.) s/p TEE with cardioversion on 02/04/2020. b.) on daily apixaban.  ? Bladder mass   ? a.) CT 04/08/2021 --> 3.2 cm intraluminal bladder mass.  ? Carotid stenosis, bilateral   ? a.) Doppler 07/04/2020 --> mild; 1-49% stenosis BILATERALLY.  ? Cholelithiasis   ? a.) CT 11/06/2020 --> largest measured 4.5 cm  ? Chronic anticoagulation   ? a.) Apixaban  ? Common biliary duct calculus   ? a.) CT 04/08/2021 --> CBD dilated at 8 mm; 93m calculus within distal CBD.  ? Coronary artery disease involving native coronary artery of native heart  without angina pectoris 12/21/2019  ? Diastolic dysfunction   ? a.) TTE 07/04/2020 --> LVEF 60-65%; G1DD.  ? Hepatic cirrhosis ([place]   ? Hepatic steatosis   ? Hepatitis 1970  ? History of 2019 novel coronavirus disease (COVID-19) 12/20/2019  ? History of marijuana use   ? Hypertension   ? Hypertensive retinopathy   ? IDA (iron deficiency anemia)   ? Mitral stenosis   ? a.) TEE 04/10/2020 --> mild. b.) TTE 07/04/2020 --> EF 60-65%; mild (mean gradient 5 mmHg).  ? Moderate pulmonary arterial systolic hypertension ([place] 12/22/2019  ? Nephrolithiasis   ? NSTEMI (non-ST elevated myocardial infarction) ([place] 07/26/2016  ? Open-angle glaucoma 09/11/2010  ? PAH (pulmonary artery hypertension) ([place]   ? a.) TTE 12/21/2019 --> PASP 44 mmHg.  ? Uterine fibroid   ? a.) CT 04/08/2021 --> multiple with largest measuring 7 cm.  ? Valvular regurgitation   ? a.) TTE 09/01/2010 --> trivial to mild pan-valvular. b.) TTE 12/21/2019 --> mild MR and AR; moderate TR. c.) TTE 07/04/2020 --> mild TR; trivial MR and PR.  ? Vestibular neuronitis   ?  ? ?PAST SURGICAL HISTORY (PMinot:  ?Past Surgical History:  ?Procedure Laterality Date  ? BREAST CYST ASPIRATION Left   ? COLONOSCOPY  2012  ? TEE WITH CARDIOVERSION N/A 02/04/2020  ? Procedure: TEE WITH CARDIOVERSION; Location: [facility]; Surgeon: CKandis Cocking MD  ? TRANSURETHRAL RESECTION OF BLADDER TUMOR N/A 05/15/2021  ? Procedure: TRANSURETHRAL RESECTION OF BLADDER TUMOR (TURBT);  Surgeon: SBilley Co MD;  Location: [facility] ORS;  Service: Urology;  Laterality: N/A;  ?  ? ?MEDICATIONS:  ?Prior to Admission medications   ?Medication Sig Start Date End Date Taking? Authorizing Provider  ?amLODipine (NORVASC) 10 MG tablet Take 10 mg by mouth daily. 09/17/21  Yes [provider]  ?ELIQUIS 5 MG TABS tablet Take 5 mg by mouth 2 (two) times daily. 05/13/20  Yes [provider]  ?magnesium oxide (MAG-OX) 400 MG tablet Take 1 tablet by mouth 2 (two) times daily. 03/24/20  Yes [provider]  ?metoprolol succinate (TOPROL-XL) 25 MG 24 hr tablet Take 1 tablet (25 mg total) by mouth daily. Take with or immediately following a meal. 08/10/21  Yes Little Ishikawa, MD  ?oxyCODONE-acetaminophen (PERCOCET/ROXICET) 5-325 MG tablet Take 1 tablet by mouth every 6 (six) hours as needed for moderate pain or severe pain. 08/10/21  Yes Little Ishikawa, MD  ?potassium chloride SA (KLOR-CON M) 20 MEQ tablet Take 1 tablet (20 mEq total) by mouth 2 (two) times daily. 10/08/21  Yes Lloyd Huger, MD  ?trolamine salicylate (ASPERCREME) 10 % cream Apply 1 application topically as needed for muscle pain.   Yes [provider]  ?diclofenac (VOLTAREN) 50 MG EC tablet Take 1 tablet (50 mg total) by mouth 3 (three) times daily. ?Patient not taking: Reported on 12/17/2021 08/10/21   Little Ishikawa, MD  ?FEROSUL 325 (65 Fe) MG tablet Take 325 mg by mouth daily. ?Patient not taking: Reported on 12/17/2021 05/10/20   [provider]  ?  ? ?ALLERGIES:  ?Allergies  ?Allergen Reactions  ? Atenolol Other (See Comments)  ?  bradycardia ?bradycardia ?  ? Penicillins Rash  ?  ? ?SOCIAL HISTORY:  ?Social History  ? ?Socioeconomic History  ? Marital status: Single  ?  Spouse name: Not on file  ? Number of children: 1  ? Years of education: Not on file  ? Highest education level: Not on file  ?Occupational History  ? Not on file  ?Tobacco Use  ? Smoking status: Former  ?  Packs/day: 0.10  ?  Years: 10.00  ?  Pack years: 1.00  ?  Types: Cigarettes  ? Smokeless tobacco: Never  ? Tobacco comments:  ?  occasional smoker  ?Vaping Use  ? Vaping Use: Never used  ?Substance and Sexual Activity  ? Alcohol use: Yes  ?  Alcohol/week: 5.0 standard drinks  ?  Types: 5 Cans of beer per week  ?  Comment: weekly  ? Drug use: Not Currently  ?  Types: Marijuana  ?  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 Sexual activity: Not on file  ?Other Topics Concern  ? Not on file  ?Social History Narrative  ? Lives with son  ? ?Social  Determinants of Health  ? ?Financial Resource Strain: Not on file  ?Food Insecurity: Not on file  ?Transportation Needs: Not on file  ?Physical Activity: Not on file  ?Stress: Not on file  ?Social Connections: Not on file  ?Intimate Partner Violence: Not on file  ?  ? ?FAMILY HISTORY:  ?Family History  ?Problem Relation Age of Onset  ? Aneurysm Mother   ? Colon cancer Father   ? Lung cancer Brother   ? Breast cancer Neg Hx   ?  ? ? ?REVIEW OF SYSTEMS:  ?Review of Systems  ?Constitutional:  Positive for malaise/fatigue. Negative for chills and fever.  ?Respiratory:  Negative for cough and shortness of breath.   ?Cardiovascular:  Negative for  chest pain and palpitations.  ?Gastrointestinal:  Positive for abdominal pain. Negative for diarrhea, nausea and vomiting.  ?Genitourinary:  Positive for dysuria. Negative for urgency.  ?Neurological:  Positive for weakness. Negative for dizziness and headaches.  ?All other systems reviewed and are negative. ? ?VITAL SIGNS:  ?Temp:  [97.4 ?F (36.3 ?C)] 97.4 ?F (36.3 ?C) (05/11 4270) ?Pulse Rate:  [85-170] 119 (05/11 1045) ?Resp:  [16-24] 22 (05/11 1045) ?BP: (119-161)/(79-120) 151/120 (05/11 1045) ?SpO2:  [100 %] 100 % (05/11 1045) ?Weight:  [59.4 kg] 59.4 kg (05/11 0842)     Height: '4\' 11"'$  (149.9 cm) Weight: 59.4 kg BMI (Calculated): 26.44  ? ?INTAKE/OUTPUT:  ?No intake/output data recorded. ? ?PHYSICAL EXAM:  ?Physical Exam ?Vitals and nursing note reviewed.  ?Constitutional:   ?   General: She is not in acute distress. ?   Appearance: She is well-developed and normal weight. She is not ill-appearing or toxic-appearing.  ?   Comments: Resting in bed, appears weak, NAD  ?Eyes:  ?   General: Scleral icterus present.  ?   Extraocular Movements: Extraocular movements intact.  ?Cardiovascular:  ?   Rate and Rhythm: Tachycardia present. Rhythm irregular.  ?   Comments: Heart is tachycardic, irregularly irregular consistent with Afib  ?Pulmonary:  ?   Effort: Pulmonary effort is  normal. No respiratory distress.  ?Abdominal:  ?   General: Abdomen is flat. There is no distension.  ?   Palpations: Abdomen is soft.  ?   Tenderness: There is generalized abdominal tenderness. There is no guarding or reb

## 2021-12-17 NOTE — Consult Note (Signed)
Pharmacy Antibiotic Note ? ?Kristina Ellis is a 71 y.o. female admitted on 12/17/2021 with  intra-abdominal infection .  Pharmacy has been consulted for Zosyn dosing. ? ?Plan: ?Zosyn 3.375g IV q8h (4 hour infusion). ? ?Height: '4\' 11"'$  (149.9 cm) ?Weight: 59.4 kg (131 lb) ?IBW/kg (Calculated) : 43.2 ? ?Temp (24hrs), Avg:97.4 ?F (36.3 ?C), Min:97.4 ?F (36.3 ?C), Max:97.4 ?F (36.3 ?C) ? ?Recent Labs  ?Lab 12/17/21 ?6606 12/17/21 ?3016 12/17/21 ?1038 12/17/21 ?1252  ?WBC 9.0  --   --   --   ?CREATININE  --   --  1.10*  --   ?LATICACIDVEN  --  3.7*  --  1.6  ?  ?Estimated Creatinine Clearance: 37.3 mL/min (A) (by C-G formula based on SCr of 1.1 mg/dL (H)).   ? ?Allergies  ?Allergen Reactions  ? Atenolol Other (See Comments)  ?  bradycardia ?bradycardia ?  ? Penicillins Rash  ? ? ?Antimicrobials this admission: ?5/11 Ceftriaxone x 1  ?5/11 Zosyn >>  ? ?Dose adjustments this admission: n/a ? ?Thank you for allowing pharmacy to be a part of this patient?s care. ? ?Zacory Fiola Rodriguez-Guzman PharmD, BCPS ?12/17/2021 3:28 PM ? ?

## 2021-12-17 NOTE — ED Provider Notes (Signed)
? ?[facility] ?Provider Note ? ? ? Event Date/Time  ? First MD Initiated Contact with Patient 12/17/21 0845   ?  (approximate) ? ? ?History  ? ?Abdominal Pain ? ? ?HPI ? ?Kristina Ellis is a 71 y.o. female who on review of discharge summary from January 2 this year A. fib with cardioversion (follows Dr. Syliva Overman) 02/04/2020 on Eliquis metoprolol CHADS2 score >3 on Eliquis, pulmonary hypertension with moderate TR HTN, prior NSTEMI with nuclear stress test 03/13/2020 without reversible ischemia, hepatitis, poor functional status with NYHA class II-III symptoms baseline, concern for OSA needing outpatient sleep testing ? ?Additionally the patient is noted to have a history of bladder cancer stage II.  Last oncology note from March to notes no notation of active malignancy.  Also noted was a low ANC with a consideration for future bone marrow biopsy ? ?Patient reports occasionally she will have a loose bowel movement, she had 1 over the weekend, and she was at a place where she was not able to clean up herself promptly.  She reports she feels like she got some stool in around her urethra and vaginal area.  Then starting 1 or 2 days ago started having pain and burning with urination fatigue and nausea and dry heaves.  She reports some burning and discomfort when she urinates and feels that she is developed a "UTI" ? ?Denies fever.  Fatigue nausea and dry heaving.  Denies abdominal pain but does report it hurts when she urinates and burns.  Urine has also been malodorous ? ? ?Physical Exam  ? ?Triage Vital Signs: ?ED Triage Vitals [12/17/21 0842]  ?Enc Vitals Group  ?   BP   ?   Pulse   ?   Resp   ?   Temp   ?   Temp src   ?   SpO2   ?   Weight 131 lb (59.4 kg)  ?   Height _0  (1.499 m)  ?   Head Circumference   ?   Peak Flow   ?   Pain Score 6  ?   Pain Loc   ?   Pain Edu?   ?   Excl. in [place]?   ? ? ?Most recent vital signs: ?Vitals:  ? 12/17/21 1015 12/17/21 1045  ?BP: (!) 155/112 (!) 151/120   ?Pulse: (!) 141 (!) 119  ?Resp: (!) 24 (!) 22  ?Temp:    ?SpO2: 100% 100%  ? ? ? ?General: Awake, no distress.  Notable for significant tachycardia.  Patient reports she has not had her daily medication yet and does have a history of A-fib. ?CV:  Good peripheral perfusion.  Tachycardic and irregular.  Strong palpable radial pulses tachycardic ?Resp:  Normal effort.  Clear lungs bilaterally ?Abd:  No distention.  Abdomen soft nontender nondistended through all quadrants.  Denies pain or discomfort to palpation.  No peritonitis. ?Other:  Warm well perfused peripherally.  She is fully alert and well-oriented, very tolerant of her rapid heart rate of approximately 150-1 70 with apparent A-fib RVR on interpretation of monitoring telemetry ? ?Mucous membranes are dry.  Patient having occasional dry heaving but no active emesis to noted ? ? ?ED Results / Procedures / Treatments  ? ?Labs ?(all labs ordered are listed, but only abnormal results are displayed) ?Labs Reviewed  ?CBC - Abnormal; Notable for the following components:  ?    Result Value  ? MCV 106.5 (*)   ? [place]  35.5 (*)   ? All other components within normal limits  ?URINALYSIS, ROUTINE W REFLEX MICROSCOPIC - Abnormal; Notable for the following components:  ? Color, Urine AMBER (*)   ? APPearance CLOUDY (*)   ? Hgb urine dipstick SMALL (*)   ? Bilirubin Urine SMALL (*)   ? Protein, ur 30 (*)   ? Leukocytes,Ua LARGE (*)   ? WBC, UA >50 (*)   ? Bacteria, UA RARE (*)   ? All other components within normal limits  ?LACTIC ACID, PLASMA - Abnormal; Notable for the following components:  ? Lactic Acid, Venous 3.7 (*)   ? All other components within normal limits  ?COMPREHENSIVE METABOLIC PANEL - Abnormal; Notable for the following components:  ? Potassium 3.0 (*)   ? Glucose, Bld 102 (*)   ? Creatinine, Ser 1.10 (*)   ? Calcium 8.1 (*)   ? Albumin 2.9 (*)   ? AST 46 (*)   ? Total Bilirubin 3.2 (*)   ? GFR, Estimated 54 (*)   ? All other components within normal limits   ?PROTIME-INR - Abnormal; Notable for the following components:  ? Prothrombin Time 15.8 (*)   ? INR 1.3 (*)   ? All other components within normal limits  ?MAGNESIUM - Abnormal; Notable for the following components:  ? Magnesium 1.5 (*)   ? All other components within normal limits  ?CULTURE, BLOOD (ROUTINE X 2)  ?CULTURE, BLOOD (ROUTINE X 2)  ?URINE CULTURE  ?LIPASE, BLOOD  ?LACTIC ACID, PLASMA  ?LACTIC ACID, PLASMA  ?LACTIC ACID, PLASMA  ? ? ? ?EKG ? ?Reviewed inter by me at 850 ?Heart rate 170 ?QRS 100 ?QTc 480 ?Atrial fibrillation rapid ventricular response.  Mild nonspecific T wave abnormality. ? ?Patient denies any chest pain or difficulty breathing ? ? ?RADIOLOGY ? ?Patient not with any acute respiratory complaint.  No cough.  Normal lung exam.  No indication for chest imaging to noted. ? ?Initially no associated abdominal pain.  No pain in any quadrant.  Negative Murphy.  Have ordered right upper quadrant ultrasound however to follow-up on noted elevated T. bili but suspect this may be secondary to sepsis.  CBC without evidence of hemolysis normal platelet count normal hemoglobin ? ?CT ABDOMEN PELVIS W CONTRAST ? ?Result Date: 12/17/2021 ?CLINICAL DATA:  Abdominal pain, acute, nonlocalized EXAM: CT ABDOMEN AND PELVIS WITH CONTRAST TECHNIQUE: Multidetector CT imaging of the abdomen and pelvis was performed using the standard protocol following bolus administration of intravenous contrast. RADIATION DOSE REDUCTION: This exam was performed according to the departmental dose-optimization program which includes automated exposure control, adjustment of the mA and/or kV according to patient size and/or use of iterative reconstruction technique. CONTRAST:  161m OMNIPAQUE IOHEXOL 300 MG/ML  SOLN COMPARISON:  CT 04/08/2021, PET-CT 06/17/2021 FINDINGS: Lower chest: Extensive mitral annular calcifications. No acute abnormality. Hepatobiliary: Hepatic steatosis with nodular contour suggesting cirrhosis. There is an a  large intraluminal gallstone seen on prior exam measuring up to 5.3 cm. There is distension of the gallbladder with marked gallbladder wall thickening. There is layering hyperdense material in the gallbladder fundus. There are tiny stones in the distal common bile duct (series 2, image 26, with mild upstream prominence of the biliary ducts. Pancreas: Unremarkable. No pancreatic ductal dilatation or surrounding inflammatory changes. Spleen: Normal in size without focal abnormality. Adrenals/Urinary Tract: Adrenal glands are unremarkable. Punctate nonobstructive stone in the right mid kidney. No hydronephrosis. The bladder is moderately distended. Stomach/Bowel: The stomach is within normal limits. There  is no evidence of bowel obstruction.The appendix is normal. Vascular/Lymphatic: Aorta iliac atherosclerosis. No AAA. No lymphadenopathy. Reproductive: There is a large heterogeneously enhancing posterior uterine the mass measuring up to 8.1 x 5.8 cm. Additional uterine fibroids noted. Other: There is a prominent left periaortic lymph node measuring up to 0.8 cm short axis, nonspecific. There is trace abdominopelvic ascites. Musculoskeletal: No acute osseous abnormality. No suspicious lytic or blastic lesions. There are multilevel degenerative changes of the spine, most severe at L4-L5 and L5-S1. IMPRESSION: Hepatic cirrhosis and steatosis with trace abdominopelvic ascites. Dilated gallbladder with marked gallbladder wall thickening and large intraluminal stone measuring up to 5.3 cm. Correlate with physical exam and laboratory findings for signs of cholecystitis. Tiny stones in the distal common bile duct, similar to prior exams, with mild upstream biliary ductal prominence. Correlate with LFTs. Prominent left periaortic lymph node measuring up to 0.8 cm short axis, nonspecific but new since the prior PET-CT. Recommend attention on follow-up surveillance imaging. No evidence of bowel obstruction.  Normal appendix.  Large heterogeneous enhancing posterior uterine mass measuring up to 8.1 cm, likely leiomyoma. Correlate with any history of abnormal uterine bleeding. Electronically Signed   By: Maurine Simmering M.D.   On: 05/11

## 2021-12-17 NOTE — Assessment & Plan Note (Signed)
Treatment as outlined in 1 

## 2021-12-17 NOTE — Progress Notes (Signed)
Elink following code sepsis °

## 2021-12-17 NOTE — ED Notes (Signed)
Informed rn bed assigned 

## 2021-12-17 NOTE — Progress Notes (Signed)
Notified bedside nurse of need to draw repeat lactic acid. 

## 2021-12-17 NOTE — H&P (Signed)
Chief Complaint: Patient was seen in consultation today for acute cholecystitis  at the request of Lucile Shutters, MD  Referring Physician(s): Lucile Shutters, MD  Supervising Physician: Mir, Mauri Reading  Patient Status: [facility] - In-pt  History of Present Illness: Kristina Ellis is a 71 y.o. female with past medical history significant for A-fib, bladder cancer status post transurethral resection of tumor, status post chemoradiation therapy, CAD, cirrhosis of liver and HTN.  Patient presented to [facility] ED 12/17/2021 complaining of abdominal pain x3 days associated with nausea vomiting and diarrhea.  She adds she also has palpitations and shortness of breath with no chest pain.  Patient was noted to be in A-fib with RVR in the ED and was treated with IV metoprolol.  Patient underwent CT abdomen pelvis that resulted with dilated gallbladder with marked gallbladder thickening correlating with physical exam and lab findings significant for cholecystitis.  Scan also showed large heterogenous enhancing posterior uterine mass measuring 8.1 cm and hepatic cirrhosis with trace abdominopelvic ascites.  Patient was referred to IR by Dr. Joylene Igo for cholecystostomy drain placement today due to sepsis.  Procedure was approved by Dr. Bryn Gulling, IR.  CT abdomen pelvis 12/17/2021: IMPRESSION: Hepatic cirrhosis and steatosis with trace abdominopelvic ascites.   Dilated gallbladder with marked gallbladder wall thickening and large intraluminal stone measuring up to 5.3 cm. Correlate with physical exam and laboratory findings for signs of cholecystitis.   Tiny stones in the distal common bile duct, similar to prior exams, with mild upstream biliary ductal prominence. Correlate with LFTs.   Prominent left periaortic lymph node measuring up to 0.8 cm short axis, nonspecific but new since the prior PET-CT. Recommend attention on follow-up surveillance imaging.   No evidence of bowel obstruction.  Normal appendix.    Large heterogeneous enhancing posterior uterine mass measuring up to 8.1 cm, likely leiomyoma. Correlate with any history of abnormal uterine bleeding.    Past Medical History:  Diagnosis Date   Aortic atherosclerosis (HCC)    Arthritis    Atrial fibrillation (HCC)    a.) s/p TEE with cardioversion on 02/04/2020. b.) on daily apixaban.   Bladder mass    a.) CT 04/08/2021 --> 3.2 cm intraluminal bladder mass.   Carotid stenosis, bilateral    a.) Doppler 07/04/2020 --> mild; 1-49% stenosis BILATERALLY.   Cholelithiasis    a.) CT 11/06/2020 --> largest measured 4.5 cm   Chronic anticoagulation    a.) Apixaban   Common biliary duct calculus    a.) CT 04/08/2021 --> CBD dilated at 8 mm; 5mm calculus within distal CBD.   Coronary artery disease involving native coronary artery of native heart without angina pectoris 12/21/2019   Diastolic dysfunction    a.) TTE 07/04/2020 --> LVEF 60-65%; G1DD.   Hepatic cirrhosis (HCC)    Hepatic steatosis    Hepatitis 1970   History of 2019 novel coronavirus disease (COVID-19) 12/20/2019   History of marijuana use    Hypertension    Hypertensive retinopathy    IDA (iron deficiency anemia)    Mitral stenosis    a.) TEE 04/10/2020 --> mild. b.) TTE 07/04/2020 --> EF 60-65%; mild (mean gradient 5 mmHg).   Moderate pulmonary arterial systolic hypertension (HCC) 12/22/2019   Nephrolithiasis    NSTEMI (non-ST elevated myocardial infarction) (HCC) 07/26/2016   Open-angle glaucoma 09/11/2010   PAH (pulmonary artery hypertension) (HCC)    a.) TTE 12/21/2019 --> PASP 44 mmHg.   Uterine fibroid    a.) CT 04/08/2021 --> multiple with largest  measuring 7 cm.   Valvular regurgitation    a.) TTE 09/01/2010 --> trivial to mild pan-valvular. b.) TTE 12/21/2019 --> mild MR and AR; moderate TR. c.) TTE 07/04/2020 --> mild TR; trivial MR and PR.   Vestibular neuronitis     Past Surgical History:  Procedure Laterality Date   BREAST CYST ASPIRATION Left     COLONOSCOPY  2012   TEE WITH CARDIOVERSION N/A 02/04/2020   Procedure: TEE WITH CARDIOVERSION; Location: [facility]; Surgeon: Joneen Roach, MD   TRANSURETHRAL RESECTION OF BLADDER TUMOR N/A 05/15/2021   Procedure: TRANSURETHRAL RESECTION OF BLADDER TUMOR (TURBT);  Surgeon: Sondra Come, MD;  Location: [facility] ORS;  Service: Urology;  Laterality: N/A;    Allergies: Atenolol and Penicillins  Medications: Prior to Admission medications   Medication Sig Start Date End Date Taking? Authorizing Provider  amLODipine (NORVASC) 10 MG tablet Take 10 mg by mouth daily. 09/17/21  Yes [provider]  ELIQUIS 5 MG TABS tablet Take 5 mg by mouth 2 (two) times daily. 05/13/20  Yes [provider]  magnesium oxide (MAG-OX) 400 MG tablet Take 1 tablet by mouth 2 (two) times daily. 03/24/20  Yes [provider]  metoprolol succinate (TOPROL-XL) 25 MG 24 hr tablet Take 1 tablet (25 mg total) by mouth daily. Take with or immediately following a meal. 08/10/21  Yes Azucena Fallen, MD  oxyCODONE-acetaminophen (PERCOCET/ROXICET) 5-325 MG tablet Take 1 tablet by mouth every 6 (six) hours as needed for moderate pain or severe pain. 08/10/21  Yes Azucena Fallen, MD  potassium chloride SA (KLOR-CON M) 20 MEQ tablet Take 1 tablet (20 mEq total) by mouth 2 (two) times daily. 10/08/21  Yes Jeralyn Ruths, MD  trolamine salicylate (ASPERCREME) 10 % cream Apply 1 application topically as needed for muscle pain.   Yes [provider]  diclofenac (VOLTAREN) 50 MG EC tablet Take 1 tablet (50 mg total) by mouth 3 (three) times daily. Patient not taking: Reported on 12/17/2021 08/10/21   Azucena Fallen, MD  FEROSUL 325 (65 Fe) MG tablet Take 325 mg by mouth daily. Patient not taking: Reported on 12/17/2021 05/10/20   [provider]     Family History  Problem Relation Age of Onset   Aneurysm Mother    Colon cancer Father    Lung cancer Brother    Breast cancer Neg Hx      Social History   Socioeconomic History   Marital status: Single    Spouse name: Not on file   Number of children: 1   Years of education: Not on file   Highest education level: Not on file  Occupational History   Not on file  Tobacco Use   Smoking status: Former    Packs/day: 0.10    Years: 10.00    Pack years: 1.00    Types: Cigarettes   Smokeless tobacco: Never   Tobacco comments:    occasional smoker  Vaping Use   Vaping Use: Never used  Substance and Sexual Activity   Alcohol use: Yes    Alcohol/week: 5.0 standard drinks    Types: 5 Cans of beer per week    Comment: weekly   Drug use: Not Currently    Types: 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  All other systems are negative.  Review of Systems  Constitutional:  Positive for appetite change and unexpected weight change. Negative for chills and fever.  Respiratory:  Negative for cough and shortness of breath.   Cardiovascular:  Negative for chest pain and leg swelling.  Gastrointestinal:  Positive for abdominal distention, abdominal pain, nausea and vomiting.   Vital Signs: BP (!) 151/120   Pulse (!) 119   Temp (!) 97.4 F (36.3 C) (Oral)   Resp (!) 22   Ht 4\' 11"  (1.499 m)   Wt 131 lb (59.4 kg)   SpO2 100%   BMI 26.46 kg/m   Physical Exam Constitutional:      General: She is not in acute distress.    Appearance: Normal appearance. She is not ill-appearing.  HENT:     Head: Normocephalic and atraumatic.     Mouth/Throat:     Mouth: Mucous membranes  are moist.     Pharynx: Oropharynx is clear.  Eyes:     Extraocular Movements: Extraocular movements intact.     Pupils: Pupils are equal, round, and reactive to light.  Cardiovascular:     Rate and Rhythm: Tachycardia present. Rhythm irregular.     Pulses: Normal pulses.  Pulmonary:     Effort: Pulmonary effort is normal. No respiratory distress.     Breath sounds: Normal breath sounds.  Abdominal:     Tenderness: There is abdominal tenderness.  Skin:    General: Skin is warm and dry.  Neurological:     Mental Status: She is alert and oriented to person, place, and time.  Psychiatric:        Mood and Affect: Mood normal.        Behavior: Behavior normal.        Thought Content: Thought content normal.    Imaging: CT ABDOMEN PELVIS W CONTRAST  Result Date: 12/17/2021 CLINICAL DATA:  Abdominal pain, acute, nonlocalized EXAM: CT ABDOMEN AND PELVIS WITH CONTRAST TECHNIQUE: Multidetector CT imaging of the abdomen and pelvis was performed using the standard protocol following bolus administration of intravenous contrast. RADIATION DOSE REDUCTION: This exam was performed according to the departmental dose-optimization program which includes automated exposure control, adjustment of the mA and/or kV according to patient size and/or use of iterative reconstruction technique. CONTRAST:  OMNIPAQUE IOHEXOL 300 MG/ML  SOLN COMPARISON:  CT 04/08/2021, PET-CT 06/17/2021 FINDINGS: Lower chest: Extensive mitral annular calcifications. No acute abnormality. Hepatobiliary: Hepatic steatosis with nodular contour suggesting cirrhosis. There is an a large intraluminal gallstone seen on prior exam measuring up to 5.3 cm. There is distension of the gallbladder with marked gallbladder wall thickening. There is layering hyperdense material in the gallbladder fundus. There are tiny stones in the distal common bile duct (series 2, image 26, with mild upstream prominence of the biliary ducts. Pancreas:  Unremarkable. No pancreatic ductal dilatation or surrounding inflammatory changes. Spleen: Normal in size without focal abnormality. Adrenals/Urinary Tract: Adrenal glands are unremarkable. Punctate nonobstructive stone in the right mid kidney. No hydronephrosis. The bladder is moderately distended. Stomach/Bowel: The stomach is within normal limits. There is no evidence of bowel obstruction.The appendix is normal. Vascular/Lymphatic: Aorta iliac atherosclerosis. No AAA.  No lymphadenopathy. Reproductive: There is a large heterogeneously enhancing posterior uterine the mass measuring up to 8.1 x 5.8 cm. Additional uterine fibroids noted. Other: There is a prominent left periaortic lymph node measuring up to 0.8 cm short axis, nonspecific. There is trace abdominopelvic ascites. Musculoskeletal: No acute osseous abnormality. No suspicious lytic or blastic lesions. There are multilevel degenerative changes of the spine, most severe at L4-L5 and L5-S1. IMPRESSION: Hepatic cirrhosis and steatosis with trace abdominopelvic ascites. Dilated gallbladder with marked gallbladder wall thickening and large intraluminal stone measuring up to 5.3 cm. Correlate with physical exam and laboratory findings for signs of cholecystitis. Tiny stones in the distal common bile duct, similar to prior exams, with mild upstream biliary ductal prominence. Correlate with LFTs. Prominent left periaortic lymph node measuring up to 0.8 cm short axis, nonspecific but new since the prior PET-CT. Recommend attention on follow-up surveillance imaging. No evidence of bowel obstruction.  Normal appendix. Large heterogeneous enhancing posterior uterine mass measuring up to 8.1 cm, likely leiomyoma. Correlate with any history of abnormal uterine bleeding. Electronically Signed   By: Caprice Renshaw M.D.   On: 12/17/2021 12:40    Labs:  CBC: Recent Labs    08/09/21 0148 08/10/21 0624 10/08/21 0901 12/17/21 0931  WBC 3.1* 3.1* 1.4* 9.0  HGB 10.3*  9.5* 11.6* 14.3  HCT 29.4* 27.5* 33.3* 42.9  PLT 252 167 89* 176    COAGS: No results for input(s): INR, APTT in the last 8760 hours.  BMP: Recent Labs    08/09/21 0148 08/10/21 0624 10/08/21 0901 12/17/21 1038  NA 134* 135 138 136  K 2.7* 3.3* 2.9* 3.0*  CL 100 104 107 102  CO2 22 24 19* 22  GLUCOSE 148* 85 90 102*  BUN 11 11 7* 17  CALCIUM 7.6* 7.4* 8.4* 8.1*  CREATININE 0.68 0.55 0.67 1.10*  GFRNONAA >60 >60 >60 54*    LIVER FUNCTION TESTS: Recent Labs    08/09/21 0148 08/10/21 0624 12/17/21 1038  BILITOT 1.1 1.5* 3.2*  AST 48* 35 46*  ALT 25 23 28   ALKPHOS 45 43 49  PROT 6.4* 5.8* 8.1  ALBUMIN 2.6* 2.2* 2.9*    TUMOR MARKERS: No results for input(s): AFPTM, CEA, CA199, CHROMGRNA in the last 8760 hours.  Assessment and Plan: History of A-fib, bladder cancer status post transurethral resection of tumor, status post chemoradiation therapy, CAD, cirrhosis of liver and HTN.  Patient presented to [facility] ED 12/17/2021 complaining of abdominal pain x3 days associated with nausea vomiting and diarrhea.  She adds she also has palpitations and shortness of breath with no chest pain.  Patient was noted to be in A-fib with RVR in the ED and was treated with IV metoprolol.  Patient underwent CT abdomen pelvis that resulted with dilated gallbladder with marked gallbladder thickening correlating with physical exam and lab findings significant for cholecystitis.  Scan also showed large heterogenous enhancing posterior uterine mass measuring 8.1 cm and hepatic cirrhosis with trace abdominopelvic ascites.  Patient was referred to IR by Dr. Joylene Igo for cholecystostomy drain placement today due to sepsis.  Procedure was approved by Dr. Bryn Gulling, IR.  CT abdomen pelvis 12/17/2021: IMPRESSION: Hepatic cirrhosis and steatosis with trace abdominopelvic ascites.   Dilated gallbladder with marked gallbladder wall thickening and large intraluminal stone measuring up to 5.3 cm. Correlate  with physical exam and laboratory findings for signs of cholecystitis.   Tiny stones in the distal common bile duct, similar to prior exams, with mild upstream biliary ductal  prominence. Correlate with LFTs.   Prominent left periaortic lymph node measuring up to 0.8 cm short axis, nonspecific but new since the prior PET-CT. Recommend attention on follow-up surveillance imaging.   No evidence of bowel obstruction.  Normal appendix.   Large heterogeneous enhancing posterior uterine mass measuring up to 8.1 cm, likely leiomyoma. Correlate with any history of abnormal uterine bleeding.  Pt resting on stretcher. She is A&O, calm and pleasant.  She is in no distress.  Pt states that she does not want drain at this time.  She wants to wait and see if she improves with abx and fluids. Dr. Bryn Gulling, IR, made team aware of pt decision.  Pt is NPO INR 1.3   Risks and benefits discussed with the patient including, but not limited to bleeding, infection, gallbladder perforation, bile leak, sepsis or even death.   **Consent not obtained. Pt refusing at this time.  H&P in place per IR MD in case pt needs urgent intervention.   Thank you for this interesting consult.  I greatly enjoyed meeting Kristina Ellis and look forward to participating in their care.  A copy of this report was sent to the requesting provider on this date.  Electronically Signed: Shon Hough, NP 12/17/2021, 1:28 PM   I spent a total of 20 minutes in face to face in clinical consultation, greater than 50% of which was counseling/coordinating care for cholecystostomy drain placement.

## 2021-12-17 NOTE — ED Triage Notes (Signed)
Pt here with abd pain that started Mon. Pt endorses N/V/D. Pt states pain is RLQ and radiates to her back.  ?

## 2021-12-17 NOTE — Plan of Care (Signed)
?  Problem: Health Behavior/Discharge Planning: ?Goal: Ability to manage health-related needs will improve ?Outcome: Progressing ?  ?Problem: Clinical Measurements: ?Goal: Will remain free from infection ?Outcome: Progressing ?  ?Problem: Clinical Measurements: ?Goal: Respiratory complications will improve ?Outcome: Progressing ?  ?Problem: Clinical Measurements: ?Goal: Cardiovascular complication will be avoided ?Outcome: Progressing ?  ?Problem: Pain Managment: ?Goal: General experience of comfort will improve ?Outcome: Progressing ?  ?Problem: Safety: ?Goal: Ability to remain free from injury will improve ?Outcome: Progressing ?  ?

## 2021-12-17 NOTE — H&P (Signed)
?History and Physical  ? ? ?Patient: Kristina Ellis EAV:[id number] DOB: October 08, 1950 ?DOA: 12/17/2021 ?DOS: the patient was seen and examined on 12/17/2021 ?[facility]P: [facility] [place]  ?Patient coming from: Home ? ?Chief Complaint:  ?Chief Complaint  ?Patient presents with  ? Abdominal Pain  ? ?HPI: Kristina Ellis is a 71 y.o. female with medical history significant for A-fib s/p TEE with cardioversion, history of bladder cancer status post transurethral resection of the tumor, s/p chemo and radiation therapy, coronary artery disease, liver cirrhosis, hypertension who presents to the ER for evaluation of abdominal pain for about 3 days.  Abdominal pain is diffuse and rated an 8 x 10 in intensity at its worst.  It is nonradiating and is associated with nausea, vomiting and diarrhea.  She complains of chills but denies having any fever. ?She complains of palpitations and shortness of breath but denies having any chest pain.  She denies having any headache, no leg swelling, no dizziness, no lightheadedness, no blurred vision or focal deficit. ?She was noted to be in A-fib with a rapid ventricular rate and received a dose of metoprolol 2.5 mg IV ?Review of Systems: As mentioned in the history of present illness. All other systems reviewed and are negative. ?Past Medical History:  ?Diagnosis Date  ? Aortic atherosclerosis ([place])   ? Arthritis   ? Atrial fibrillation ([place])   ? a.) s/p TEE with cardioversion on 02/04/2020. b.) on daily apixaban.  ? Bladder mass   ? a.) CT 04/08/2021 --> 3.2 cm intraluminal bladder mass.  ? Carotid stenosis, bilateral   ? a.) Doppler 07/04/2020 --> mild; 1-49% stenosis BILATERALLY.  ? Cholelithiasis   ? a.) CT 11/06/2020 --> largest measured 4.5 cm  ? Chronic anticoagulation   ? a.) Apixaban  ? Common biliary duct calculus   ? a.) CT 04/08/2021 --> CBD dilated at 8 mm; 81m calculus within distal CBD.  ? Coronary artery disease involving native coronary artery of native heart  without angina pectoris 12/21/2019  ? Diastolic dysfunction   ? a.) TTE 07/04/2020 --> LVEF 60-65%; G1DD.  ? Hepatic cirrhosis ([place]   ? Hepatic steatosis   ? Hepatitis 1970  ? History of 2019 novel coronavirus disease (COVID-19) 12/20/2019  ? History of marijuana use   ? Hypertension   ? Hypertensive retinopathy   ? IDA (iron deficiency anemia)   ? Mitral stenosis   ? a.) TEE 04/10/2020 --> mild. b.) TTE 07/04/2020 --> EF 60-65%; mild (mean gradient 5 mmHg).  ? Moderate pulmonary arterial systolic hypertension ([place] 12/22/2019  ? Nephrolithiasis   ? NSTEMI (non-ST elevated myocardial infarction) ([place] 07/26/2016  ? Open-angle glaucoma 09/11/2010  ? PAH (pulmonary artery hypertension) ([place]   ? a.) TTE 12/21/2019 --> PASP 44 mmHg.  ? Uterine fibroid   ? a.) CT 04/08/2021 --> multiple with largest measuring 7 cm.  ? Valvular regurgitation   ? a.) TTE 09/01/2010 --> trivial to mild pan-valvular. b.) TTE 12/21/2019 --> mild MR and AR; moderate TR. c.) TTE 07/04/2020 --> mild TR; trivial MR and PR.  ? Vestibular neuronitis   ? ?Past Surgical History:  ?Procedure Laterality Date  ? BREAST CYST ASPIRATION Left   ? COLONOSCOPY  2012  ? TEE WITH CARDIOVERSION N/A 02/04/2020  ? Procedure: TEE WITH CARDIOVERSION; Location: [facility]; Surgeon: CKandis Cocking MD  ? TRANSURETHRAL RESECTION OF BLADDER TUMOR N/A 05/15/2021  ? Procedure: TRANSURETHRAL RESECTION OF BLADDER TUMOR (TURBT);  Surgeon: SBilley Co MD;  Location: [facility] ORS;  Service: Urology;  Laterality: N/A;  ? ?Social History:  reports that she has quit smoking. Her smoking use included cigarettes. She has a 1.00 pack-year smoking history. She has never used smokeless tobacco. She reports current alcohol use of about 5.0 standard drinks per week. She reports that she does not currently use drugs after having used the following drugs: Marijuana. ? ?Allergies  ?Allergen Reactions  ? Atenolol Other (See Comments)  ?  bradycardia ?bradycardia ?  ? Penicillins Rash  ? ? ?Family  History  ?Problem Relation Age of Onset  ? Aneurysm Mother   ? Colon cancer Father   ? Lung cancer Brother   ? Breast cancer Neg Hx   ? ? ?Prior to Admission medications   ?Medication Sig Start Date End Date Taking? Authorizing Provider  ?amLODipine (NORVASC) 10 MG tablet Take 10 mg by mouth daily. 09/17/21  Yes [provider]  ?ELIQUIS 5 MG TABS tablet Take 5 mg by mouth 2 (two) times daily. 05/13/20  Yes [provider]  ?magnesium oxide (MAG-OX) 400 MG tablet Take 1 tablet by mouth 2 (two) times daily. 03/24/20  Yes [provider]  ?metoprolol succinate (TOPROL-XL) 25 MG 24 hr tablet Take 1 tablet (25 mg total) by mouth daily. Take with or immediately following a meal. 08/10/21  Yes Little Ishikawa, MD  ?oxyCODONE-acetaminophen (PERCOCET/ROXICET) 5-325 MG tablet Take 1 tablet by mouth every 6 (six) hours as needed for moderate pain or severe pain. 08/10/21  Yes Little Ishikawa, MD  ?potassium chloride SA (KLOR-CON M) 20 MEQ tablet Take 1 tablet (20 mEq total) by mouth 2 (two) times daily. 10/08/21  Yes Lloyd Huger, MD  ?trolamine salicylate (ASPERCREME) 10 % cream Apply 1 application topically as needed for muscle pain.   Yes [provider]  ?diclofenac (VOLTAREN) 50 MG EC tablet Take 1 tablet (50 mg total) by mouth 3 (three) times daily. ?Patient not taking: Reported on 12/17/2021 08/10/21   Little Ishikawa, MD  ?FEROSUL 325 (65 Fe) MG tablet Take 325 mg by mouth daily. ?Patient not taking: Reported on 12/17/2021 05/10/20   [provider]  ? ? ?Physical Exam: ?Vitals:  ? 12/17/21 0930 12/17/21 1006 12/17/21 1015 12/17/21 1045  ?BP: (!) 145/105 (!) 161/115 (!) 155/112 (!) 151/120  ?Pulse: 85  (!) 141 (!) 119  ?Resp: 16 (!) 21 (!) 24 (!) 22  ?Temp:      ?TempSrc:      ?SpO2: 100%  100% 100%  ?Weight:      ?Height:      ? ?Physical Exam ?Vitals and nursing note reviewed.  ?Constitutional:   ?   Comments: Acutely ill-appearing  ?HENT:  ?   Head: Normocephalic.   ?   Mouth/Throat:  ?   Comments: Dry mucous membranes ?Cardiovascular:  ?   Rate and Rhythm: Tachycardia present. Rhythm irregular.  ?Pulmonary:  ?   Effort: Pulmonary effort is normal.  ?   Breath sounds: Normal breath sounds.  ?Abdominal:  ?   General: Abdomen is flat.  ?   Palpations: Abdomen is soft.  ?   Tenderness: There is abdominal tenderness in the right upper quadrant and epigastric area. There is guarding.  ?   Comments: Diffusely tender but mostly in the epigastric and right upper quadrant  ?Skin: ?   General: Skin is warm and dry.  ?Neurological:  ?   Mental Status: She is alert.  ?   Motor: Weakness present.  ?Psychiatric:     ?  Mood and Affect: Mood normal.     ?   Behavior: Behavior normal.  ? ? ?Data Reviewed: ?Relevant notes from primary care and specialist visits, past discharge summaries as available in EHR, including Care Everywhere. ?Prior diagnostic testing as pertinent to current admission diagnoses ?Updated medications and problem lists for reconciliation ?ED course, including vitals, labs, imaging, treatment and response to treatment ?Triage notes, nursing and pharmacy notes and ED provider's notes ?Notable results as noted in HPI ?Labs reviewed.  Sodium 136, potassium 3.0, chloride 102, bicarb 22, glucose 102, BUN 17, creatinine 1.10 compared to baseline of 0.67, calcium 8.1, total protein 8.1, albumin 2.9, AST 46, ALT 28, total bilirubin 3.2, lipase 28, lactic acid 3.7, white count 9.0, hemoglobin 14.3, hematocrit 42.9, platelet count 176 ?UA shows pyuria ?CT scan of abdomen and pelvis with contrast shows hepatic cirrhosis and steatosis with trace abdominopelvic ascites. Dilated gallbladder with marked gallbladder wall thickening and large intraluminal stone measuring up to 5.3 cm.  Tiny stones in the distal common bile duct, similar to prior exams, with mild upstream biliary ductal prominence. Correlate with LFTs.Prominent left periaortic lymph node measuring up to 0.8 cm short ?axis,  nonspecific but new since the prior PET-CT. Recommend attention on follow-up surveillance imaging. ?No evidence of bowel obstruction.  Normal appendix. Large heterogeneous enhancing posterior uterine mass

## 2021-12-17 NOTE — Assessment & Plan Note (Signed)
Patient with a known history of A-fib who presented in a rapid ventricular rate. ?Continue aggressive IV fluid resuscitation  ?Continue p.o. metoprolol for rate control ?Hold Eliquis for planned procedure ?

## 2021-12-17 NOTE — Progress Notes (Addendum)
Patient admitted to the hospital for sepsis from acute cholecystitis and possible urinary source.  Noted to be in rapid A-fib.  She remains tachycardic despite IV fluid resuscitation and IV metoprolol. ?We will start patient on IV Cardizem ?Supplement magnesium and potassium ?

## 2021-12-17 NOTE — ED Notes (Signed)
External cath placed at this time. Pt placed in hospital gown. ?

## 2021-12-17 NOTE — ED Notes (Signed)
Patient reports no complaints at this time.  Resting quietly ?

## 2021-12-17 NOTE — Assessment & Plan Note (Signed)
Most likely related to GI losses from nausea and vomiting Supplement potassium 

## 2021-12-17 NOTE — Progress Notes (Signed)
Returned a phone call for the son; patient is currently admitted to the hospital for non-oncology related condition. Recommend calling the hospitalist service for the information.

## 2021-12-17 NOTE — Assessment & Plan Note (Signed)
Patient with a history of stage II bladder cancer status post TURBT. ?Status post chemo and radiation therapy ?Follow-up with oncology as an outpatient ?

## 2021-12-18 ENCOUNTER — Encounter
Admission: EM | Disposition: A | Discharge status: Home Health | Payer: Self-pay | Source: Home / Self Care | Attending: Internal Medicine

## 2021-12-18 ENCOUNTER — Inpatient Hospital Stay: Payer: Medicare Other | Admitting: Anesthesiology

## 2021-12-18 ENCOUNTER — Inpatient Hospital Stay: Payer: Medicare Other

## 2021-12-18 ENCOUNTER — Encounter: Payer: Self-pay | Admitting: Internal Medicine

## 2021-12-18 DIAGNOSIS — K315 Obstruction of duodenum: Secondary | ICD-10-CM | POA: Diagnosis not present

## 2021-12-18 DIAGNOSIS — I4891 Unspecified atrial fibrillation: Secondary | ICD-10-CM | POA: Diagnosis not present

## 2021-12-18 DIAGNOSIS — K8050 Calculus of bile duct without cholangitis or cholecystitis without obstruction: Secondary | ICD-10-CM | POA: Diagnosis not present

## 2021-12-18 DIAGNOSIS — K810 Acute cholecystitis: Secondary | ICD-10-CM | POA: Diagnosis not present

## 2021-12-18 HISTORY — PX: ERCP: SHX5425

## 2021-12-18 LAB — URINE CULTURE

## 2021-12-18 LAB — HEPATIC FUNCTION PANEL
ALT: 16 U/L (ref 0–44)
AST: 28 U/L (ref 15–41)
Albumin: 2.2 g/dL — ABNORMAL LOW (ref 3.5–5.0)
Alkaline Phosphatase: 38 U/L (ref 38–126)
Bilirubin, Direct: 1.4 mg/dL — ABNORMAL HIGH (ref 0.0–0.2)
Indirect Bilirubin: 0.6 mg/dL (ref 0.3–0.9)
Total Bilirubin: 2 mg/dL — ABNORMAL HIGH (ref 0.3–1.2)
Total Protein: 6.4 g/dL — ABNORMAL LOW (ref 6.5–8.1)

## 2021-12-18 LAB — CBC
HCT: 32.7 % — ABNORMAL LOW (ref 36.0–46.0)
Hemoglobin: 11.2 g/dL — ABNORMAL LOW (ref 12.0–15.0)
MCH: 36.2 pg — ABNORMAL HIGH (ref 26.0–34.0)
MCHC: 34.3 g/dL (ref 30.0–36.0)
MCV: 105.8 fL — ABNORMAL HIGH (ref 80.0–100.0)
Platelets: 135 10*3/uL — ABNORMAL LOW (ref 150–400)
RBC: 3.09 MIL/uL — ABNORMAL LOW (ref 3.87–5.11)
RDW: 13.3 % (ref 11.5–15.5)
WBC: 11.8 10*3/uL — ABNORMAL HIGH (ref 4.0–10.5)
nRBC: 0 % (ref 0.0–0.2)

## 2021-12-18 LAB — BASIC METABOLIC PANEL
Anion gap: 9 (ref 5–15)
BUN: 16 mg/dL (ref 8–23)
CO2: 24 mmol/L (ref 22–32)
Calcium: 8 mg/dL — ABNORMAL LOW (ref 8.9–10.3)
Chloride: 105 mmol/L (ref 98–111)
Creatinine, Ser: 0.72 mg/dL (ref 0.44–1.00)
GFR, Estimated: >60 mL/min (ref >60)
Glucose, Bld: 82 mg/dL (ref 70–99)
Potassium: 3.1 mmol/L — ABNORMAL LOW (ref 3.5–5.1)
Sodium: 138 mmol/L (ref 135–145)

## 2021-12-18 LAB — PROTIME-INR
INR: 1.3 — ABNORMAL HIGH (ref 0.8–1.2)
Prothrombin Time: 15.8 seconds — ABNORMAL HIGH (ref 11.4–15.2)

## 2021-12-18 LAB — GLUCOSE, CAPILLARY: Glucose-Capillary: 82 mg/dL (ref 70–99)

## 2021-12-18 LAB — HEPATITIS B CORE ANTIBODY, TOTAL: Hep B Core Total Ab: REACTIVE — AB

## 2021-12-18 LAB — PROCALCITONIN: Procalcitonin: 1.28 ng/mL

## 2021-12-18 LAB — LACTIC ACID, PLASMA: Lactic Acid, Venous: 1.3 mmol/L (ref 0.5–1.9)

## 2021-12-18 LAB — HEPATITIS B SURFACE ANTIGEN: Hepatitis B Surface Ag: NONREACTIVE

## 2021-12-18 LAB — HEPATITIS A ANTIBODY, TOTAL: hep A Total Ab: REACTIVE — AB

## 2021-12-18 LAB — HEPATITIS C ANTIBODY: HCV Ab: REACTIVE — AB

## 2021-12-18 LAB — CORTISOL-AM, BLOOD: Cortisol - AM: 24.7 ug/dL — ABNORMAL HIGH (ref 6.7–22.6)

## 2021-12-18 SURGERY — ERCP, WITH INTERVENTION IF INDICATED
Anesthesia: General

## 2021-12-18 MED ORDER — ENSURE ENLIVE PO LIQD
237.0000 mL | Freq: Three times a day (TID) | ORAL | Status: DC
  Administered 2021-12-22: 237 mL via ORAL

## 2021-12-18 MED ORDER — ADULT MULTIVITAMIN W/MINERALS CH
1.0000 | ORAL_TABLET | Freq: Every day | ORAL | Status: AC
Start: 2021-12-18 — End: 2021-12-19
  Administered 2021-12-18 – 2021-12-19 (×2): 1 via ORAL
  Filled 2021-12-18 (×2): qty 1

## 2021-12-18 MED ORDER — INDOMETHACIN 50 MG RE SUPP: 100.0000 mg | Freq: Once | RECTAL | Status: AC

## 2021-12-18 MED ORDER — INDOMETHACIN 50 MG RE SUPP
RECTAL | Status: AC
  Administered 2021-12-18: 100 mg via RECTAL
  Filled 2021-12-18: qty 2

## 2021-12-18 MED ORDER — PROPOFOL 10 MG/ML IV BOLUS
INTRAVENOUS | Status: DC | PRN
  Administered 2021-12-18: 70 mg via INTRAVENOUS

## 2021-12-18 MED ORDER — BOOST / RESOURCE BREEZE PO LIQD CUSTOM
1.0000 | Freq: Three times a day (TID) | ORAL | Status: DC
  Administered 2021-12-18 – 2021-12-19 (×3): 1 via ORAL

## 2021-12-18 MED ORDER — PROPOFOL 10 MG/ML IV BOLUS
INTRAVENOUS | Status: AC
  Filled 2021-12-18: qty 20

## 2021-12-18 MED ORDER — LIDOCAINE HCL (CARDIAC) PF 100 MG/5ML IV SOSY
PREFILLED_SYRINGE | INTRAVENOUS | Status: DC | PRN
  Administered 2021-12-18: 40 mg via INTRAVENOUS

## 2021-12-18 MED ORDER — SODIUM CHLORIDE 0.9 % IV SOLN: INTRAVENOUS | Status: DC

## 2021-12-18 MED ORDER — PROPOFOL 500 MG/50ML IV EMUL
INTRAVENOUS | Status: DC | PRN
  Administered 2021-12-18: 125 ug/kg/min via INTRAVENOUS

## 2021-12-18 NOTE — Progress Notes (Signed)
?PROGRESS NOTE ? ? ? ?Kristina Ellis  QQI:[id number] DOB: [birth year] DOA: 12/17/2021 ?PCP: [facility]  ? ? ?Brief Narrative:  ?Kristina Ellis is a 71 y.o. female with medical history significant for A-fib s/p TEE with cardioversion, history of bladder cancer status post transurethral resection of the tumor, s/p chemo and radiation therapy, coronary artery disease, liver cirrhosis, hypertension who presents to the ER for evaluation of abdominal pain for about 3 days.  Abdominal pain is diffuse and rated an 8 x 10 in intensity at its worst.  It is nonradiating and is associated with nausea, vomiting and diarrhea.  She complains of chills but denies having any fever. ?She complains of palpitations and shortness of breath but denies having any chest pain.  She denies having any headache, no leg swelling, no dizziness, no lightheadedness, no blurred vision or focal deficit. ?She was noted to be in A-fib with a rapid ventricular rate and received a dose of metoprolol 2.5 mg IV ? ? ? ?Consultants:  ?Surgery, GI, IR ? ?Procedures:  ? ?Antimicrobials:  ?  ? ? ?Subjective: ?No sob, cp. Abd pain  better this am ? ?Objective: ?Vitals:  ? 12/17/21 2030 12/17/21 2341 12/18/21 0333 12/18/21 0827  ?BP:  (!) 155/91 128/83 133/83  ?Pulse:    68  ?Resp:  20 20   ?Temp:  97.7 ?F (36.5 ?C) 98 ?F (36.7 ?C)   ?TempSrc:  Oral Oral   ?SpO2:  100% 99% 100%  ?Weight: 55.9 kg     ?Height: '4\' 11"'$  (1.499 m)     ? ? ?Intake/Output Summary (Last 24 hours) at 12/18/2021 0916 ?Last data filed at 12/18/2021 0000 ?Gross per 24 hour  ?Intake 426.36 ml  ?Output --  ?Net 426.36 ml  ? ?Filed Weights  ? 12/17/21 0842 12/17/21 2030  ?Weight: 59.4 kg 55.9 kg  ? ? ?Examination: ? ?Calm, NAD ?Cta no w/r ?Reg s1/s2 no gallop ?Soft benign +bs ?No edema ?Aaoxox3  ?Mood and affect appropriate in current setting  ? ? ? ?Data Reviewed: I have personally reviewed following labs and imaging studies ? ?CBC: ?Recent Labs  ?Lab 12/17/21 ?9417  12/18/21 ?0541  ?WBC 9.0 11.8*  ?HGB 14.3 11.2*  ?HCT 42.9 32.7*  ?MCV 106.5* 105.8*  ?PLT 176 135*  ? ?Basic Metabolic Panel: ?Recent Labs  ?Lab 12/17/21 ?1038 12/17/21 ?1252 12/18/21 ?0541  ?NA 136  --  138  ?K 3.0*  --  3.1*  ?CL 102  --  105  ?CO2 22  --  24  ?GLUCOSE 102*  --  82  ?BUN 17  --  16  ?CREATININE 1.10*  --  0.72  ?CALCIUM 8.1*  --  8.0*  ?MG  --  1.5*  --   ? ?GFR: ?Estimated Creatinine Clearance: 49.9 mL/min (by C-G formula based on SCr of 0.72 mg/dL). ?Liver Function Tests: ?Recent Labs  ?Lab 12/17/21 ?1038 12/18/21 ?0541  ?AST 46* 28  ?ALT 28 16  ?ALKPHOS 49 38  ?BILITOT 3.2* 2.0*  ?PROT 8.1 6.4*  ?ALBUMIN 2.9* 2.2*  ? ?Recent Labs  ?Lab 12/17/21 ?1038  ?LIPASE 28  ? ?No results for input(s): AMMONIA in the last 168 hours. ?Coagulation Profile: ?Recent Labs  ?Lab 12/17/21 ?1336 12/18/21 ?0541  ?INR 1.3* 1.3*  ? ?Cardiac Enzymes: ?No results for input(s): CKTOTAL, CKMB, CKMBINDEX, TROPONINI in the last 168 hours. ?BNP (last 3 results) ?No results for input(s): PROBNP in the last 8760 hours. ?HbA1C: ?No results for input(s): HGBA1C  in the last 72 hours. ?CBG: ?Recent Labs  ?Lab 12/18/21 ?4709  ?GLUCAP 82  ? ?Lipid Profile: ?No results for input(s): CHOL, HDL, LDLCALC, TRIG, CHOLHDL, LDLDIRECT in the last 72 hours. ?Thyroid Function Tests: ?No results for input(s): TSH, T4TOTAL, FREET4, T3FREE, THYROIDAB in the last 72 hours. ?Anemia Panel: ?No results for input(s): VITAMINB12, FOLATE, FERRITIN, TIBC, IRON, RETICCTPCT in the last 72 hours. ?Sepsis Labs: ?Recent Labs  ?Lab 12/17/21 ?6283 12/17/21 ?1252 12/17/21 ?2057 12/18/21 ?0034 12/18/21 ?0541  ?PROCALCITON  --   --   --   --  1.28  ?LATICACIDVEN 3.7* 1.6 1.8 1.3  --   ? ? ?Recent Results (from the past 240 hour(s))  ?Blood culture (routine x 2)     Status: None (Preliminary result)  ? Collection Time: 12/17/21  9:13 AM  ? Specimen: BLOOD  ?Result Value Ref Range Status  ? Specimen Description BLOOD BLOOD RIGHT HAND  Final  ? Special Requests    Final  ?  BOTTLES DRAWN AEROBIC AND ANAEROBIC Blood Culture results may not be optimal due to an inadequate volume of blood received in culture bottles  ? Culture   Final  ?  NO GROWTH < 24 HOURS ?Performed at [facility], [address] [place], [place] [address] ?  ? Report Status PENDING  Incomplete  ?Blood culture (routine x 2)     Status: None (Preliminary result)  ? Collection Time: 12/17/21  9:34 AM  ? Specimen: BLOOD  ?Result Value Ref Range Status  ? Specimen Description BLOOD BLOOD RIGHT FOREARM  Final  ? Special Requests   Final  ?  BOTTLES DRAWN AEROBIC AND ANAEROBIC Blood Culture results may not be optimal due to an inadequate volume of blood received in culture bottles  ? Culture   Final  ?  NO GROWTH < 24 HOURS ?Performed at [facility], [address], [place], [place] [address] ?  ? Report Status PENDING  Incomplete  ?Urine Culture     Status: Abnormal  ? Collection Time: 12/17/21  9:41 AM  ? Specimen: Urine, Clean Catch  ?Result Value Ref Range Status  ? Specimen Description   Final  ?  URINE, CLEAN CATCH ?Performed at [facility], [address], [place], [place] [address] ?  ? Special Requests   Final  ?  NONE ?Performed at [facility], [address], [place], [place] [address] ?  ? Culture MULTIPLE SPECIES PRESENT, SUGGEST RECOLLECTION (A)  Final  ? Report Status 12/18/2021 FINAL  Final  ?  ? ? ? ? ? ?Radiology Studies: ?CT ABDOMEN PELVIS W CONTRAST ? ?Result Date: 12/17/2021 ?CLINICAL DATA:  Abdominal pain, acute, nonlocalized EXAM: CT ABDOMEN AND PELVIS WITH CONTRAST TECHNIQUE: Multidetector CT imaging of the abdomen and pelvis was performed using the standard protocol following bolus administration of intravenous contrast. RADIATION DOSE REDUCTION: This exam was performed according to the departmental dose-optimization program which includes automated exposure control, adjustment of the mA and/or kV according to patient size and/or use of iterative  reconstruction technique. CONTRAST:  123m OMNIPAQUE IOHEXOL 300 MG/ML  SOLN COMPARISON:  CT 04/08/2021, PET-CT 06/17/2021 FINDINGS: Lower chest: Extensive mitral annular calcifications. No acute abnormality. Hepatobiliary: Hepatic steatosis with nodular contour suggesting cirrhosis. There is an a large intraluminal gallstone seen on prior exam measuring up to 5.3 cm. There is distension of the gallbladder with marked gallbladder wall thickening. There is layering hyperdense material in the gallbladder fundus. There are tiny stones in the distal  common bile duct (series 2, image 26, with mild upstream prominence of the biliary ducts. Pancreas: Unremarkable. No pancreatic ductal dilatation or surrounding inflammatory changes. Spleen: Normal in size without focal abnormality. Adrenals/Urinary Tract: Adrenal glands are unremarkable. Punctate nonobstructive stone in the right mid kidney. No hydronephrosis. The bladder is moderately distended. Stomach/Bowel: The stomach is within normal limits. There is no evidence of bowel obstruction.The appendix is normal. Vascular/Lymphatic: Aorta iliac atherosclerosis. No AAA. No lymphadenopathy. Reproductive: There is a large heterogeneously enhancing posterior uterine the mass measuring up to 8.1 x 5.8 cm. Additional uterine fibroids noted. Other: There is a prominent left periaortic lymph node measuring up to 0.8 cm short axis, nonspecific. There is trace abdominopelvic ascites. Musculoskeletal: No acute osseous abnormality. No suspicious lytic or blastic lesions. There are multilevel degenerative changes of the spine, most severe at L4-L5 and L5-S1. IMPRESSION: Hepatic cirrhosis and steatosis with trace abdominopelvic ascites. Dilated gallbladder with marked gallbladder wall thickening and large intraluminal stone measuring up to 5.3 cm. Correlate with physical exam and laboratory findings for signs of cholecystitis. Tiny stones in the distal common bile duct, similar to prior  exams, with mild upstream biliary ductal prominence. Correlate with LFTs. Prominent left periaortic lymph node measuring up to 0.8 cm short axis, nonspecific but new since the prior PET-CT. Recommend att

## 2021-12-18 NOTE — Progress Notes (Signed)
Pt picked up for ERCP, report given to RN, pt on cardizem drip and on room air, no distress or pain noted. ?

## 2021-12-18 NOTE — Anesthesia Postprocedure Evaluation (Signed)
Anesthesia Post Note ? ?Patient: Kristina Ellis ? ?Procedure(s) Performed: ENDOSCOPIC RETROGRADE CHOLANGIOPANCREATOGRAPHY (ERCP) ? ?Patient location during evaluation: PACU ?Anesthesia Type: General ?Level of consciousness: awake ?Pain management: satisfactory to patient ?Respiratory status: spontaneous breathing and nonlabored ventilation ?Cardiovascular status: stable ?Anesthetic complications: no ? ? ?No notable events documented. ? ? ?Last Vitals:  ?Vitals:  ? 12/18/21 1403 12/18/21 1413  ?BP: 127/82   ?Pulse: (!) 106 95  ?Resp: 20   ?Temp: (!) 36.3 ?C   ?SpO2: 100% 100%  ?  ?Last Pain:  ?Vitals:  ? 12/18/21 1413  ?TempSrc:   ?PainSc: 0-No pain  ? ? ?  ?  ?  ?  ?  ?  ? ?VAN STAVEREN,Morgin Halls ? ? ? ? ?

## 2021-12-18 NOTE — Progress Notes (Signed)
[facility] ?SURGICAL PROGRESS NOTE (cpt [phone]) ? ?Hospital Day(s): 1.  ? ?Interval History: Patient seen and examined, she refused percutaneous cholecystostomy tube placement yesterday. Patient reports her abdominal pain is improved. She continues to have RUQ soreness but reports this is improved. She denies fever, chills, nausea, emesis. Leukocytosis worsening; 11.8K. Renal function normal sCr - 0.72; OU - unmeasured. Mild hypokalemia to 3.1. Hyperbilirubinemia improved; down to 2.0 (direct 1.4). She is on Zosyn. Seen by GI with plans for ERCP today.  ? ?Please note, Dr Hampton Abbot again had extensive discussion at bedside regarding percutaneous cholecystostomy tube placement today given persistent pain and leukocytosis. She again adamantly refused this multiple time. She understands the risk of inadequately treating her cholecystitis and potential for continued worsening.  ? ?Review of Systems:  ?Constitutional: denies fever, chills  ?HEENT: denies cough or congestion  ?Respiratory: denies any shortness of breath  ?Cardiovascular: denies chest pain or palpitations  ?Gastrointestinal: + abdominal pain (improved), denied N/V ?Genitourinary: denies burning with urination or urinary frequency ?Musculoskeletal: denies pain, decreased motor or sensation ? ?Vital signs in last 24 hours: [min-max] current  ?Temp:  [97.4 ?F (36.3 ?C)-98.3 ?F (36.8 ?C)] 98 ?F (36.7 ?C) (05/12 2536) ?Pulse Rate:  [33-170] 94 (05/11 2029) ?Resp:  [14-26] 20 (05/12 0333) ?BP: (116-178)/(79-139) 128/83 (05/12 0333) ?SpO2:  [98 %-100 %] 99 % (05/12 0333) ?Weight:  [55.9 kg-59.4 kg] 55.9 kg (05/11 2030)     Height: '4\' 11"'$  (149.9 cm) Weight: 55.9 kg BMI (Calculated): 24.88  ? ?Intake/Output last 2 shifts:  ?05/11 0701 - 05/12 0700 ?In: 426.4 [I.V.:376.4; IV Piggyback:50] ?Out: -   ? ?Physical Exam:  ?Constitutional: alert, cooperative and no distress  ?HENT: normocephalic without obvious abnormality  ?Eyes: PERRL, EOM's grossly  intact and symmetric  ?Respiratory: breathing non-labored at rest  ?Cardiovascular: regular rate and sinus rhythm  ?Gastrointestinal: soft, overall tenderness improved but she is still point tender in the RUQ, non-distended, no rebound/guarding. She is not overtly peritonitic  ?Musculoskeletal: no edema or wounds, motor and sensation grossly intact, NT  ? ? ?Labs:  ? ?  Latest Ref Rng & Units 12/18/2021  ?  5:41 AM 12/17/2021  ?  9:31 AM 10/08/2021  ?  9:01 AM  ?CBC  ?WBC 4.0 - 10.5 K/uL 11.8   9.0   1.4    ?Hemoglobin 12.0 - 15.0 g/dL 11.2   14.3   11.6    ?Hematocrit 36.0 - 46.0 % 32.7   42.9   33.3    ?Platelets 150 - 400 K/uL 135   176   89    ? ? ?  Latest Ref Rng & Units 12/18/2021  ?  5:41 AM 12/17/2021  ? 10:38 AM 10/08/2021  ?  9:01 AM  ?CMP  ?Glucose 70 - 99 mg/dL 82   102   90    ?BUN 8 - 23 mg/dL '16   17   7    '$ ?Creatinine 0.44 - 1.00 mg/dL 0.72   1.10   0.67    ?Sodium 135 - 145 mmol/L 138   136   138    ?Potassium 3.5 - 5.1 mmol/L 3.1   3.0   2.9    ?Chloride 98 - 111 mmol/L 105   102   107    ?CO2 22 - 32 mmol/L '24   22   19    '$ ?Calcium 8.9 - 10.3 mg/dL 8.0   8.1   8.4    ?Total Protein 6.5 -  8.1 g/dL  8.1     ?Total Bilirubin 0.3 - 1.2 mg/dL  3.2     ?Alkaline Phos 38 - 126 U/L  49     ?AST 15 - 41 U/L  46     ?ALT 0 - 44 U/L  28     ? ? ? ?Imaging studies: No new pertinent imaging studies ? ? ?Assessment/Plan: (ICD-10's: K81.0) ?71 y.o. female with abdominal pain and hyperbilirubinemia found to have large gallstone with likely cholecystitis and choledocholithiasis. ?  ? - Given her comorbidities and need for Eliquis, she is a sub-optimal surgical candidate in this setting. In this case, we would typically recommend percutaneous cholecystostomy tube placement. IR consulted and evaluated the patient; appreciate their involvement. She did refuse this yesterday; however, her WBC worsened again today and she continues to have RUQ pain. As mentioned in H&P, she was educated again on our recommendation for  percutaneous cholecystostomy placement especially given lack of improvements overnight. She adamantly refused this multiple times again today. As such, would recommend continued IV Abx and monitor. Unfortunately, there is not much we can add from a surgical perspective given patient wishes and refusal of recommendations.  ? ?           - Appreciate GI assistance; ERCP today ?           - Recommend NPO for now for ERCP; ADAT after procedure ?- IV Abx (Zosyn) ?           - Monitor abdominal examination ? - Monitor leukocytosis; worsening ?- Monitor hyperbilirubinemia; Improved ?- Pain control prn; antiemetics prn  ?           - Further management per primary service ? ? - As she is refusing all recommendation from our perspective, we will sign off and follow peripherally. Continue medical management with IV Abx, she understands there is a chance this gallbladder process could continue to worsen. We will be readily available as needed.  ? ?All of the above findings and recommendations were discussed with the patient, and all of patient's questions were answered to her expressed satisfaction. ? ?-- ?Edison Simon, PA-C ?[facility] ?12/18/2021, 7:49 AM ?M-F: 7am - 4pm ? ?

## 2021-12-18 NOTE — Progress Notes (Addendum)
Nutrition Follow-up ? ?DOCUMENTATION CODES:  ? ?Non-severe (moderate) malnutrition in context of chronic illness ? ?INTERVENTION:  ? ?-Once diet is advanced, add:  ? ?-Ensure Enlive po TID, each supplement provides 350 kcal and 20 grams of protein ?-MVI with minerals daily ? ?NUTRITION DIAGNOSIS:  ? ?Moderate Malnutrition related to chronic illness as evidenced by mild fat depletion, moderate fat depletion, mild muscle depletion, moderate muscle depletion, energy intake < or equal to 75% for > or equal to 1 month, percent weight loss. ? ?GOAL:  ? ?Patient will meet greater than or equal to 90% of their needs ? ?MONITOR:  ? ?PO intake, Supplement acceptance, Diet advancement ? ?REASON FOR ASSESSMENT:  ? ?Malnutrition Screening Tool ?  ? ?ASSESSMENT:  ? ?Pt with medical history significant for A-fib s/p TEE with cardioversion, history of bladder cancer status post transurethral resection of the tumor, s/p chemo and radiation therapy, coronary artery disease, liver cirrhosis, hypertension who presents  for evaluation of abdominal pain. ? ?Pt admitted with a-fib with RVR, sepsis, UTI,  and cholangitis.  ? ?Reviewed I/O's: +426 ml x 24 hours ? ?Pt currently NPO for ERCP today.  ? ?Spoke with pt at bedside, who was pleasant and in good spirits today. She shares that she has had decreased oral intake over the past 3 weeks secondary to abdominal pain. She estimates she has consumed about 50% at meals than she usually does due to abdominal pain. Per pt, she has also noticed that she has episodes of diarrhea whenever she drinks something cold. She has started drinking Ensure 2 times per day to help with nutritional intake.  ? ?Per pt, her UBW is around 240# and she has progressively lost weight over the past 3 years due to difficulty chewing foods secondary to dental extractions. Reviewed wt hx; pt has experienced a 10.7% wt loss over the past 3 months, which is significant for time frame. Pt also relates emotional stress in  her life, including losing her mom about a month ago, as well as her uncle who has been in and out the hospital.  ? ?Discussed importance of good meal and supplement intake to promote healing. Pt amenable to Ensure supplements; encouraged her to continue using at home. Offered to mechanically downgrade diet, however, pt politely declined, stating she would prefer to choose menu items that she thinks she will be able to tolerate.  ? ?Pt with chronic underlying malnutrition, however, suspect acute illness may be exacerbating severity of malnutrition.  ? ?Medications reviewed and include 0.9% sodium chloride infusion @ 20 ml/hr.  ? ?Labs reviewed: K: 3.1. Mg: 1.5.  ? ?NUTRITION - FOCUSED PHYSICAL EXAM: ? ?Flowsheet Row Most Recent Value  ?Orbital Region Moderate depletion  ?Upper Arm Region Mild depletion  ?Thoracic and Lumbar Region No depletion  ?Buccal Region Mild depletion  ?Temple Region Moderate depletion  ?Clavicle Bone Region Moderate depletion  ?Clavicle and Acromion Bone Region Moderate depletion  ?Scapular Bone Region Moderate depletion  ?Dorsal Hand Moderate depletion  ?Patellar Region No depletion  ?Anterior Thigh Region No depletion  ?Posterior Calf Region No depletion  ?Edema (RD Assessment) Mild  ?Hair Reviewed  ?Eyes Reviewed  ?Mouth Reviewed  ?Skin Reviewed  ?Nails Reviewed  ? ?  ? ? ?Diet Order:   ?Diet Order   ? ?       ?  Diet NPO time specified  Diet effective now       ?  ? ?  ?  ? ?  ? ? ?  EDUCATION NEEDS:  ? ?Education needs have been addressed ? ?Skin:  Skin Assessment: Reviewed RN Assessment ? ?Last BM:  12/17/21 ? ?Height:  ? ?Ht Readings from Last 1 Encounters:  ?12/17/21 '4\' 11"'$  (1.499 m)  ? ? ?Weight:  ? ?Wt Readings from Last 1 Encounters:  ?12/17/21 55.9 kg  ? ? ?Ideal Body Weight:  44.7 kg ? ?BMI:  Body mass index is 24.89 kg/m?. ? ?Estimated Nutritional Needs:  ? ?Kcal:  1750-1950 ? ?Protein:  95-110 grams ? ?Fluid:  > 1.7 L ? ? ? ?Loistine Chance, RD, LDN, CDCES ?Registered Dietitian  II ?Certified Diabetes Care and Education Specialist ?Please refer to [facility] for RD and/or RD on-call/weekend/after hours pager  ?

## 2021-12-18 NOTE — Transfer of Care (Signed)
Immediate Anesthesia Transfer of Care Note ? ?Patient: Kristina Ellis ? ?Procedure(s) Performed: Procedure(s): ?ENDOSCOPIC RETROGRADE CHOLANGIOPANCREATOGRAPHY (ERCP) (N/A) ? ?Patient Location: PACU and Endoscopy Unit ? ?Anesthesia Type:General ? ?Level of Consciousness: sedated ? ?Airway & Oxygen Therapy: Patient Spontanous Breathing and Patient connected to nasal cannula oxygen ? ?Post-op Assessment: Report given to RN and Post -op Vital signs reviewed and stable ? ?Post vital signs: Reviewed and stable ? ?Last Vitals:  ?Vitals:  ? 12/18/21 1227 12/18/21 1403  ?BP: (!) 130/92 127/82  ?Pulse: 86 (!) 106  ?Resp: 16 20  ?Temp: 36.6 ?C (!) 36.3 ?C  ?SpO2: 100% 100%  ? ? ?Complications: No apparent anesthesia complications ?

## 2021-12-18 NOTE — Anesthesia Preprocedure Evaluation (Addendum)
Anesthesia Evaluation  ?Patient identified by MRN, date of birth, ID band ?Patient awake ? ? ? ?Reviewed: ?Allergy & Precautions, NPO status , Patient's Chart, lab work & pertinent test results ? ?History of Anesthesia Complications ?Negative for: history of anesthetic complications ? ?Airway ?Mallampati: I ? ? ?Neck ROM: Full ? ? ? Dental ? ?(+) Missing ?  ?Pulmonary ?former smoker (greater than 10 years ago, was a social smoker),  ?  ?Pulmonary exam normal ?breath sounds clear to auscultation ? ? ? ? ? ? Cardiovascular ?hypertension, + CAD (s/p MI) and + Peripheral Vascular Disease (carotid stenosis)  ?+ dysrhythmias (a fib on Eliquis)  ?Rhythm:Irregular Rate:Normal ? ?ECG 12/17/21:  ?Atrial fibrillation with rapid V-rate ?Ventricular premature complex ?Left anterior fascicular block ?Anterior infarct, old ?Repolarization abnormality, prob rate related ?Baseline wander in lead(s) V4 ? ?Echo 04/10/20:  ???1. The left ventricle is normal in size with mildly to moderately increased wall thickness.  ???2. The left ventricular systolic function is hyperdynamic, LVEF is visually estimated at >70%.  ???3. Mitral annular calcification is present (severe).  ???4. There is mild mitral stenosis.  ???5. There is mild mitral valve regurgitation.  ???6. The aortic valve is trileaflet with mildly thickened leaflets with normal excursion.  ???7. The left atrium is moderately dilated in size.  ???8. The right ventricle is normal in size, with normal systolic function.  ? ?Myocardial perfusion 04/28/20:  ?No clear evidence of significant ischemia is noted.  ?Global systolic function is within normal limits. Left ventricular ejection fraction at stress is calculated at 72%.  ?Coronary and aortic calcifications noted on CT. Large gallstone.  ?  ?Neuro/Psych ?negative neurological ROS ?   ? GI/Hepatic ?negative GI ROS, (+) Cirrhosis  ?  ?  ? ,   ?Endo/Other  ?negative endocrine ROS ? Renal/GU ?negative  Renal ROS  ? ?Bladder CA ? ?  ?Musculoskeletal ? ?(+) Arthritis ,  ? Abdominal ?  ?Peds ? Hematology ? ?(+) Blood dyscrasia, anemia ,   ?Anesthesia Other Findings ?Sepsis 2/2 acute cholecystitis ? Reproductive/Obstetrics ? ?  ? ? ? ? ? ? ? ? ? ? ? ? ? ?  ?  ? ? ? ? ? ? ? ?Anesthesia Physical ?Anesthesia Plan ? ?ASA: 4 and emergent ? ?Anesthesia Plan: General  ? ?Post-op Pain Management:   ? ?Induction: Intravenous ? ?PONV Risk Score and Plan: 3 and Propofol infusion, TIVA and Treatment may vary due to age or medical condition ? ?Airway Management Planned: Natural Airway ? ?Additional Equipment:  ? ?Intra-op Plan:  ? ?Post-operative Plan:  ? ?Informed Consent: I have reviewed the patients History and Physical, chart, labs and discussed the procedure including the risks, benefits and alternatives for the proposed anesthesia with the patient or authorized representative who has indicated his/her understanding and acceptance.  ? ? ? ? ? ?Plan Discussed with: CRNA ? ?Anesthesia Plan Comments: (LMA/GETA backup discussed.  Patient consented for risks of anesthesia including but not limited to:  ?- adverse reactions to medications ?- damage to eyes, teeth, lips or other oral mucosa ?- nerve damage due to positioning  ?- sore throat or hoarseness ?- damage to heart, brain, nerves, lungs, other parts of body or loss of life ? ?Informed patient about role of CRNA in peri- and intra-operative care.  Patient voiced understanding.)  ? ? ? ? ? ?Anesthesia Quick Evaluation ? ?

## 2021-12-18 NOTE — Op Note (Signed)
[facility] ?Gastroenterology ?Patient Name: Kristina Ellis ?Procedure Date: 12/18/2021 12:06 PM ?MRN: [id number] ?Account #: [id number] ?Date of Birth: 1951/03/28 ?Admit Type: Inpatient ?Age: 71 ?Room: [facility] ENDO ROOM 4 ?Gender: Female ?Note Status: Finalized ?Instrument Name: EXALT Ercp scope ?Procedure:             ERCP ?Indications:           Common bile duct stone(s) ?Providers:             Lucilla Lame MD, MD ?Referring MD:          Bridget Hartshorn j. [facility], dr (Referring MD) ?Medicines:             Propofol per Anesthesia ?Complications:         No immediate complications. ?Procedure:             Pre-Anesthesia Assessment: ?                       - Prior to the procedure, a History and Physical was  ?                       performed, and patient medications and allergies were  ?                       reviewed. The patient's tolerance of previous  ?                       anesthesia was also reviewed. The risks and benefits  ?                       of the procedure and the sedation options and risks  ?                       were discussed with the patient. All questions were  ?                       answered, and informed consent was obtained. Prior  ?                       Anticoagulants: The patient has taken Eliquis  ?                       (apixaban), last dose was 3 days prior to procedure.  ?                       ASA Grade Assessment: II - A patient with mild  ?                       systemic disease. After reviewing the risks and  ?                       benefits, the patient was deemed in satisfactory  ?                       condition to undergo the procedure. ?                       After obtaining informed consent, the scope was passed  ?  under direct vision. Throughout the procedure, the  ?                       patient's blood pressure, pulse, and oxygen  ?                       saturations were monitored continuously. The [place]  ?                        [employer] single use duodenoscope was  ?                       introduced through the mouth, and used to inject  ?                       contrast into and used to inject contrast into the  ?                       bile duct. The ERCP was accomplished without  ?                       difficulty. The patient tolerated the procedure well. ?Findings: ?     The scout film was normal. The major papilla was normal. The upper GI  ?     tract was traversed under direct vision without detailed examination. An  ?     acquired benign-appearing, intrinsic severe stenosis was found in the  ?     first portion of the duodenum and was traversed after dilation. A TTS  ?     dilator was passed through the scope. Dilation with a 12-13.5-15 mm  ?     pyloric balloon dilator was performed in the second portion of the  ?     duodenum. The bile duct was deeply cannulated with the short-nosed  ?     traction sphincterotome. Contrast was injected. I personally interpreted  ?     the bile duct images. There was brisk flow of contrast through the  ?     ducts. Image quality was excellent. Contrast extended to the entire  ?     biliary tree. The lower third of the main bile duct contained stone(s).  ?     A wire was passed into the biliary tree. An 8 mm biliary sphincterotomy  ?     was made with a traction (standard) sphincterotome using ERBE  ?     electrocautery. There was no post-sphincterotomy bleeding. The biliary  ?     tree was swept with a 15 mm balloon starting at the bifurcation. Two  ?     stones were removed. No stones remained. ?Impression:            - The major papilla appeared normal. ?                       - Acquired duodenal stenosis. ?                       - Dilation performed in the second portion of the  ?                       duodenum. ?                       -  Choledocholithiasis was found. Complete removal was  ?                       accomplished by biliary sphincterotomy and balloon  ?                        extraction. ?                       - A biliary sphincterotomy was performed. ?                       - The biliary tree was swept. ?Recommendation:        - Return patient to hospital ward for ongoing care. ?                       - Clear liquid diet today. ?                       - Watch for pancreatitis, bleeding, perforation, and  ?                       cholangitis. ?Procedure Code(s):     --- Professional --- ?                       [phone], Endoscopic retrograde cholangiopancreatography  ?                       (ERCP); with removal of calculi/debris from  ?                       biliary/pancreatic duct(s) ?                       85885, Endoscopic retrograde cholangiopancreatography  ?                       (ERCP); with sphincterotomy/papillotomy ?                       02774, Esophagogastroduodenoscopy, flexible,  ?                       transoral; with dilation of gastric/duodenal  ?                       stricture(s) (eg, balloon, bougie) ?                       74328, Endoscopic catheterization of the biliary  ?                       ductal system, radiological supervision and  ?                       interpretation ?Diagnosis Code(s):     --- Professional --- ?                       K80.50, Calculus of bile duct without cholangitis or  ?                       cholecystitis without obstruction ?  K31.5, Obstruction of duodenum ?CPT copyright 2019 American [employer] Association. All rights reserved. ?The codes documented in this report are preliminary and upon coder review may  ?be revised to meet current compliance requirements. ?Lucilla Lame MD, MD ?12/18/2021 2:03:22 PM ?This report has been signed electronically. ?Number of Addenda: 0 ?Note Initiated On: 12/18/2021 12:06 PM ?Estimated Blood Loss:  Estimated blood loss: none. ?     [facility] ?

## 2021-12-18 NOTE — Anesthesia Procedure Notes (Signed)
Date/Time: 12/18/2021 1:00 PM ?Performed by: Doreen Salvage, CRNA ?Pre-anesthesia Checklist: Patient identified, Emergency Drugs available, Suction available and Patient being monitored ?Patient Re-evaluated:Patient Re-evaluated prior to induction ?Oxygen Delivery Method: Nasal cannula ?Induction Type: IV induction ?Dental Injury: Teeth and Oropharynx as per pre-operative assessment  ?Comments: Nasal cannula with etCO2 monitoring ? ? ? ? ?

## 2021-12-18 NOTE — Progress Notes (Addendum)
? Inpatient Follow-up/Progress Note ?  ?Patient ID: Kristina Ellis is a 71 y.o. female. ? ?Overnight Events / Subjective Findings ?NAEON. Pt resting in bed comfortably. No abdominal pain, n/v o/n. Plan for ERCP today. NPO since midnight. ?Rate controlled today. ?No other acute gi complaints. ? ?Review of Systems  ?Constitutional:  Negative for chills and fever.  ?HENT:  Negative for trouble swallowing.   ?Cardiovascular:  Negative for chest pain.  ?Gastrointestinal:  Negative for abdominal distention, abdominal pain, blood in stool, constipation, diarrhea, nausea and vomiting.  ?Skin:  Negative for color change and pallor.  ?Psychiatric/Behavioral:  Negative for confusion.    ? ?Medications ? ?Current Facility-Administered Medications:  ?  [COMPLETED] diltiazem (CARDIZEM) 1 mg/mL load via infusion 10 mg, 10 mg, Intravenous, Once, 10 mg at 12/17/21 1734 **AND** diltiazem (CARDIZEM) 125 mg in dextrose 5% 125 mL (1 mg/mL) infusion, 5-15 mg/hr, Intravenous, Continuous, Agbata, Tochukwu, MD, Last Rate: 7.5 mL/hr at 12/18/21 0353, 7.5 mg/hr at 12/18/21 0353 ?  lactated ringers 1,000 mL with potassium chloride 40 mEq infusion, , Intravenous, Continuous, Agbata, Tochukwu, MD, Last Rate: 100 mL/hr at 12/18/21 0137, New Bag at 12/18/21 0137 ?  magnesium oxide (MAG-OX) tablet 400 mg, 400 mg, Oral, BID, Agbata, Tochukwu, MD, 400 mg at 12/17/21 2108 ?  magnesium sulfate IVPB 2 g 50 mL, 2 g, Intravenous, Once, Agbata, Tochukwu, MD ?  MEDLINE mouth rinse, 15 mL, Mouth Rinse, BID, Agbata, Tochukwu, MD, 15 mL at 12/18/21 0015 ?  metoprolol succinate (TOPROL-XL) 24 hr tablet 25 mg, 25 mg, Oral, Daily, Agbata, Tochukwu, MD, 25 mg at 12/17/21 1456 ?  morphine (PF) 2 MG/ML injection 1 mg, 1 mg, Intravenous, Q4H PRN, Agbata, Tochukwu, MD, 1 mg at 12/18/21 0353 ?  ondansetron (ZOFRAN) tablet 4 mg, 4 mg, Oral, Q6H PRN **OR** ondansetron (ZOFRAN) injection 4 mg, 4 mg, Intravenous, Q6H PRN, Agbata, Tochukwu, MD ?  piperacillin-tazobactam  (ZOSYN) IVPB 3.375 g, 3.375 g, Intravenous, Q8H, Agbata, Tochukwu, MD, Last Rate: 12.5 mL/hr at 12/18/21 0604, 3.375 g at 12/18/21 0604 ?  sodium chloride 0.9 % bolus 500 mL, 500 mL, Intravenous, Once, Delman Kitten, MD ? diltiazem (CARDIZEM) infusion 7.5 mg/hr (12/18/21 0353)  ? lactated ringers with kcl 100 mL/hr at 12/18/21 0137  ? magnesium sulfate bolus IVPB    ? piperacillin-tazobactam (ZOSYN)  IV 3.375 g (12/18/21 0604)  ? sodium chloride    ?  ?morphine injection, ondansetron **OR** ondansetron (ZOFRAN) IV  ? ?Objective  ? ? ?Vitals:  ? 12/17/21 2029 12/17/21 2030 12/17/21 2341 12/18/21 0333  ?BP: 138/89  (!) 155/91 128/83  ?Pulse: 94     ?Resp: '18  20 20  '$ ?Temp: 97.7 ?F (36.5 ?C)  97.7 ?F (36.5 ?C) 98 ?F (36.7 ?C)  ?TempSrc: Oral  Oral Oral  ?SpO2: 100%  100% 99%  ?Weight:  55.9 kg    ?Height:  '4\' 11"'$  (1.499 m)    ? ? ? ?Physical Exam ?Vitals and nursing note reviewed.  ?Constitutional:   ?   General: She is not in acute distress. ?   Appearance: Normal appearance. She is well-developed. She is ill-appearing. She is not toxic-appearing or diaphoretic.  ?HENT:  ?   Head: Normocephalic and atraumatic.  ?   Nose: Nose normal.  ?   Mouth/Throat:  ?   Mouth: Mucous membranes are moist.  ?   Pharynx: Oropharynx is clear.  ?Eyes:  ?   General: Scleral icterus present.  ?   Extraocular Movements: Extraocular movements intact.  ?  Cardiovascular:  ?   Rate and Rhythm: Normal rate. Rhythm irregular.  ?   Heart sounds: Normal heart sounds. No murmur heard. ?  No friction rub. No gallop.  ?Pulmonary:  ?   Effort: Pulmonary effort is normal. No respiratory distress.  ?   Breath sounds: Normal breath sounds. No wheezing, rhonchi or rales.  ?Abdominal:  ?   General: Bowel sounds are normal. There is no distension.  ?   Palpations: Abdomen is soft.  ?   Tenderness: There is no abdominal tenderness. There is no guarding or rebound.  ?Musculoskeletal:  ?   Cervical back: Neck supple.  ?   Right lower leg: No edema.  ?   Left  lower leg: No edema.  ?Skin: ?   General: Skin is warm and dry.  ?   Coloration: Skin is not jaundiced or pale.  ?Neurological:  ?   General: No focal deficit present.  ?   Mental Status: She is alert and oriented to person, place, and time. Mental status is at baseline.  ?Psychiatric:     ?   Mood and Affect: Mood normal.     ?   Behavior: Behavior normal.     ?   Thought Content: Thought content normal.     ?   Judgment: Judgment normal.  ? ? ? ?Laboratory Data ?Recent Labs  ?Lab 12/17/21 ?4627 12/18/21 ?0541  ?WBC 9.0 11.8*  ?HGB 14.3 11.2*  ?HCT 42.9 32.7*  ?PLT 176 135*  ? ?Recent Labs  ?Lab 12/17/21 ?1038 12/18/21 ?0541  ?NA 136 138  ?K 3.0* 3.1*  ?CL 102 105  ?CO2 22 24  ?BUN 17 16  ?CREATININE 1.10* 0.72  ?CALCIUM 8.1* 8.0*  ?PROT 8.1  --   ?BILITOT 3.2*  --   ?ALKPHOS 49  --   ?ALT 28  --   ?AST 46*  --   ?GLUCOSE 102* 82  ? ?Recent Labs  ?Lab 12/17/21 ?1336 12/18/21 ?0541  ?INR 1.3* 1.3*  ? ?  ? ?Imaging Studies: ?CT ABDOMEN PELVIS W CONTRAST ? ?Result Date: 12/17/2021 ?CLINICAL DATA:  Abdominal pain, acute, nonlocalized EXAM: CT ABDOMEN AND PELVIS WITH CONTRAST TECHNIQUE: Multidetector CT imaging of the abdomen and pelvis was performed using the standard protocol following bolus administration of intravenous contrast. RADIATION DOSE REDUCTION: This exam was performed according to the departmental dose-optimization program which includes automated exposure control, adjustment of the mA and/or kV according to patient size and/or use of iterative reconstruction technique. CONTRAST:  126m OMNIPAQUE IOHEXOL 300 MG/ML  SOLN COMPARISON:  CT 04/08/2021, PET-CT 06/17/2021 FINDINGS: Lower chest: Extensive mitral annular calcifications. No acute abnormality. Hepatobiliary: Hepatic steatosis with nodular contour suggesting cirrhosis. There is an a large intraluminal gallstone seen on prior exam measuring up to 5.3 cm. There is distension of the gallbladder with marked gallbladder wall thickening. There is layering  hyperdense material in the gallbladder fundus. There are tiny stones in the distal common bile duct (series 2, image 26, with mild upstream prominence of the biliary ducts. Pancreas: Unremarkable. No pancreatic ductal dilatation or surrounding inflammatory changes. Spleen: Normal in size without focal abnormality. Adrenals/Urinary Tract: Adrenal glands are unremarkable. Punctate nonobstructive stone in the right mid kidney. No hydronephrosis. The bladder is moderately distended. Stomach/Bowel: The stomach is within normal limits. There is no evidence of bowel obstruction.The appendix is normal. Vascular/Lymphatic: Aorta iliac atherosclerosis. No AAA. No lymphadenopathy. Reproductive: There is a large heterogeneously enhancing posterior uterine the mass measuring up to 8.1 x 5.8  cm. Additional uterine fibroids noted. Other: There is a prominent left periaortic lymph node measuring up to 0.8 cm short axis, nonspecific. There is trace abdominopelvic ascites. Musculoskeletal: No acute osseous abnormality. No suspicious lytic or blastic lesions. There are multilevel degenerative changes of the spine, most severe at L4-L5 and L5-S1. IMPRESSION: Hepatic cirrhosis and steatosis with trace abdominopelvic ascites. Dilated gallbladder with marked gallbladder wall thickening and large intraluminal stone measuring up to 5.3 cm. Correlate with physical exam and laboratory findings for signs of cholecystitis. Tiny stones in the distal common bile duct, similar to prior exams, with mild upstream biliary ductal prominence. Correlate with LFTs. Prominent left periaortic lymph node measuring up to 0.8 cm short axis, nonspecific but new since the prior PET-CT. Recommend attention on follow-up surveillance imaging. No evidence of bowel obstruction.  Normal appendix. Large heterogeneous enhancing posterior uterine mass measuring up to 8.1 cm, likely leiomyoma. Correlate with any history of abnormal uterine bleeding. Electronically Signed    By: Maurine Simmering M.D.   On: 12/17/2021 12:40   ? ?Assessment:  ? ? ?#  Sepsis: secondary to UTI and concomitant cholangitis ?- Lactate 3.7--> resolved ; WBC 11, currently afebrile  ?- not currently requi

## 2021-12-19 DIAGNOSIS — I4891 Unspecified atrial fibrillation: Secondary | ICD-10-CM | POA: Diagnosis not present

## 2021-12-19 LAB — COMPREHENSIVE METABOLIC PANEL
ALT: 23 U/L (ref 0–44)
AST: 68 U/L — ABNORMAL HIGH (ref 15–41)
Albumin: 2 g/dL — ABNORMAL LOW (ref 3.5–5.0)
Alkaline Phosphatase: 59 U/L (ref 38–126)
Anion gap: 7 (ref 5–15)
BUN: 19 mg/dL (ref 8–23)
CO2: 26 mmol/L (ref 22–32)
Calcium: 7.9 mg/dL — ABNORMAL LOW (ref 8.9–10.3)
Chloride: 103 mmol/L (ref 98–111)
Creatinine, Ser: 0.66 mg/dL (ref 0.44–1.00)
GFR, Estimated: >60 mL/min (ref >60)
Glucose, Bld: 88 mg/dL (ref 70–99)
Potassium: 3.4 mmol/L — ABNORMAL LOW (ref 3.5–5.1)
Sodium: 136 mmol/L (ref 135–145)
Total Bilirubin: 3.8 mg/dL — ABNORMAL HIGH (ref 0.3–1.2)
Total Protein: 6 g/dL — ABNORMAL LOW (ref 6.5–8.1)

## 2021-12-19 LAB — CBC
HCT: 31.9 % — ABNORMAL LOW (ref 36.0–46.0)
Hemoglobin: 10.8 g/dL — ABNORMAL LOW (ref 12.0–15.0)
MCH: 35.6 pg — ABNORMAL HIGH (ref 26.0–34.0)
MCHC: 33.9 g/dL (ref 30.0–36.0)
MCV: 105.3 fL — ABNORMAL HIGH (ref 80.0–100.0)
Platelets: 148 10*3/uL — ABNORMAL LOW (ref 150–400)
RBC: 3.03 MIL/uL — ABNORMAL LOW (ref 3.87–5.11)
RDW: 13.2 % (ref 11.5–15.5)
WBC: 8 10*3/uL (ref 4.0–10.5)
nRBC: 0 % (ref 0.0–0.2)

## 2021-12-19 LAB — GLUCOSE, CAPILLARY: Glucose-Capillary: 134 mg/dL — ABNORMAL HIGH (ref 70–99)

## 2021-12-19 LAB — LIPASE, BLOOD: Lipase: 299 U/L — ABNORMAL HIGH (ref 11–51)

## 2021-12-19 LAB — HEPATITIS B SURFACE ANTIBODY, QUANTITATIVE: Hep B S AB Quant (Post): <3.1 m[IU]/mL — ABNORMAL LOW (ref >9.9)

## 2021-12-19 MED ORDER — RISAQUAD PO CAPS
2.0000 | ORAL_CAPSULE | Freq: Two times a day (BID) | ORAL | Status: DC
  Administered 2021-12-19 – 2021-12-22 (×6): 2 via ORAL
  Filled 2021-12-19 (×6): qty 2

## 2021-12-19 MED ORDER — METOPROLOL SUCCINATE ER 25 MG PO TB24
25.0000 mg | ORAL_TABLET | Freq: Two times a day (BID) | ORAL | Status: DC
  Administered 2021-12-19 – 2021-12-22 (×6): 25 mg via ORAL
  Filled 2021-12-19 (×6): qty 1

## 2021-12-19 NOTE — Progress Notes (Signed)
EKG obtained - MD notified.   ?

## 2021-12-19 NOTE — Evaluation (Signed)
Physical Therapy Evaluation ?Patient Details ?Name: Kristina Ellis ?MRN: [id number] ?DOB: 1951-06-13 ?Today's Date: 12/19/2021 ? ?History of Present Illness ? Kristina Ellis is a 71 y.o. female with medical history significant for A-fib s/p TEE with cardioversion, history of bladder cancer status post transurethral resection of the tumor, s/p chemo and radiation therapy, coronary artery disease, liver cirrhosis, hypertension who presents to the ER for evaluation of abdominal pain for about 3 days.  She was admitted for afib with RVR, UTI, acute cholecystitis, sepsis, and hypokalemia. Patient with a history of stage II bladder cancer status post TURBT. She was found to have a large gallstone and underwent ERCP on 12/18/2021. ?  ?Clinical Impression ? Patient received in bed and is agreeable to PT evaluation. Patient was slightly drowsy but able to provide detailed history. Patient states she lives with her son but that it is sometimes days between when he is there. She reports living in a single story home with a level entrance. She states prior to hospitalization she ambulated household and short community distance using the wall for support or SPC. She states she needed help for dressing lower body and assistance for IADLs. Upon PT evaluation, patient completed supine to sit with min A to help with bedclothes, sit <> stand to RW with CGA, and ambulation approx 15 feet with RW and CGA. Patient's ambulation attempt was cut short when she stated she was "going to be incontinent" and stated she needed to have a BM. Patient returned to bed while PT set up [facility] over toilet in bathroom. Patient ambulated and transferred to commode with RW and CGA. Patient appeared to have a vasovagal response to BM. Patient reported starting to feel light headed while on the commode after she started having BM. She was instructed to deep breathe and perform ankle pumps while PT performed pericare after she thought she was done with BM.  Patient had a second BM while continuing to complain of worsening lightheadedness and gradually became unable to hold up her trunk. PT called pulled call bell in bathoom and asked for help. When RN arrived patient had lost consciousness and code blue was called. PT transfered pateint from commode to transport chair to bed (brought by other staff) with assistance from multiple other healthcare professionals who were also attending to patient. Patinet's HR was monitored throughout session on telemetry and increased to 145 bpm during initial ambulation. Her HR varied between 130 and 95 bpm on telemetry when visualized when she was on commode. SpO2 remaind WFL when checked intermittantly.  Patient appears to have experienced a decrease in functional mobility and independence and would benefit from short term rehab prior to return home. Will monitor for progress with subsequent PT visits and update discharge recommendation as appropriate. Patient would benefit from skilled physical therapy to address impairments and functional limitations (see PT Problem List below) to work towards stated goals and return to PLOF or maximal functional independence.  ?   ? ?Recommendations for follow up therapy are one component of a multi-disciplinary discharge planning process, led by the attending physician.  Recommendations may be updated based on patient status, additional functional criteria and insurance authorization. ? ?Follow Up Recommendations Skilled nursing-short term rehab (<3 hours/day) ? ?  ?Assistance Recommended at Discharge Frequent or constant Supervision/Assistance  ?Patient can return home with the following ? A little help with walking and/or transfers;A little help with bathing/dressing/bathroom;Assistance with cooking/housework;Assist for transportation;Help with stairs or ramp for entrance ? ?  ?Equipment  Recommendations Rollator (4 wheels);BSC/3in1  ?Recommendations for Other Services ?    ?  ?Functional Status  Assessment Patient has had a recent decline in their functional status and demonstrates the ability to make significant improvements in function in a reasonable and predictable amount of time.  ? ?  ?Precautions / Restrictions Precautions ?Precautions: Fall ?Restrictions ?Weight Bearing Restrictions: No  ? ?  ? ?Mobility ? Bed Mobility ?Overal bed mobility: Needs Assistance ?Bed Mobility: Sit to Supine ?  ?  ?  ?Sit to supine: Min guard, Total assist, +2 for physical assistance ?  ?General bed mobility comments: assistance to move bedclothes when going supine to sit. After losing consciousness sit to supine total A +2. ?  ? ?Transfers ?Overall transfer level: Needs assistance ?  ?Transfers: Sit to/from Stand, Bed to chair/wheelchair/BSC ?Sit to Stand: Min guard, Supervision ?  ?  ?Squat pivot transfers: Total assist, +2 safety/equipment, +2 physical assistance ?  ?  ?General transfer comment: patient transfered sit <> stand from bed to bed and bed to [facility] (over toilet) with supervision-CGA while using RW. She gradually lost conciousness while sitting on toilet and required total A +2 for lines/safety squat pivot transfer to w/c then to bed. ?  ? ?Ambulation/Gait ?Ambulation/Gait assistance: Min guard ?Gait Distance (Feet): 15 Feet ?Assistive device: Rolling walker (2 wheels) ?Gait Pattern/deviations: Trunk flexed, Shuffle ?Gait velocity: very slow ?  ?Pre-gait activities: standing marching at edge of bed with RW and CGA prior to ambulation. ?General Gait Details: Pateint ambulated in room towards door and back using RW and CGA with short steps and mildly stooped posture. She reported she needed to have a BM so she returned to the bed while PT set up [facility] over toilet, then ambulated with RW and CGA ~ 10 feet to toilet. ? ?Stairs ?  ?  ?  ?  ?  ? ?Wheelchair Mobility ?  ? ?Modified Rankin (Stroke Patients Only) ?  ? ?  ? ?Balance Overall balance assessment: Needs assistance ?Sitting-balance support: Feet unsupported,  Bilateral upper extremity supported ?Sitting balance-Leahy Scale: Good ?Sitting balance - Comments: Patient demo steady sitting at edge of bed piror to having vasovagal response during BM. ?  ?  ?Standing balance-Leahy Scale: Poor ?Standing balance comment: Patient reliant on RW duirng standing and walking activities before vasovagal response during BM. ?  ?  ?  ?  ?  ?  ?  ?  ?  ?  ?  ?   ? ? ? ?Pertinent Vitals/Pain Pain Assessment ?Pain Assessment: 0-10 ?Pain Score: 5  ?Pain Location: abdomen ?Pain Intervention(s): Limited activity within patient's tolerance, Monitored during session, Repositioned  ? ? ?Home Living Family/patient expects to be discharged to:: Private residence ?Living Arrangements: Alone (states son comes in some days but not others.) ?Available Help at Discharge: Available PRN/intermittently;Family (not every day?) ?Type of Home: House ?Home Access: Level entry ?  ?  ?  ?Home Layout: One level ?Home Equipment: Kasandra Knudsen - single point ?Additional Comments: can put chair in tub/shower  ?  ?Prior Function Prior Level of Function : Needs assist ?  ?  ?  ?  ?  ?  ?Mobility Comments: ambulates with SPC or holds onto something. Denies recent falls. ?ADLs Comments: Son assists with household chores, driving and errands as needed. Pateint reports patient required help with socks and shoes and fastening her pants. Her sister in law helped her with this. ?  ? ? ?Hand Dominance  ? Dominant Hand: Right ? ?  ?  Extremity/Trunk Assessment  ? Upper Extremity Assessment ?Upper Extremity Assessment: Generalized weakness ?  ? ?Lower Extremity Assessment ?Lower Extremity Assessment: Generalized weakness ?  ? ?Cervical / Trunk Assessment ?Cervical / Trunk Assessment: Kyphotic  ?Communication  ? Communication: No difficulties  ?Cognition Arousal/Alertness: Awake/alert ?Behavior During Therapy: [facility] for tasks assessed/performed ?Overall Cognitive Status: Within Functional Limits for tasks assessed ?  ?  ?  ?  ?  ?  ?  ?  ?   ?  ?  ?  ?  ?  ?  ?  ?  ?  ?  ? ?  ?General Comments General comments (skin integrity, edema, etc.): Patient reported starting to feel light headed while on the commode after she started having BM. She was

## 2021-12-19 NOTE — Progress Notes (Signed)
Called to pt room by PT.  Pt in bathroom, leaning on wall and unable to communicate - eyes rolled back.  Assistance call to room to transfer pt to bed, obtain vitals, obtain CBG, and notify MD.  MD at bedside.  Pt alert and oriented once returned to bed.  New orders received.  Will continue to monitor.   ?

## 2021-12-19 NOTE — Progress Notes (Signed)
?PROGRESS NOTE ? ? ? ?Kristina Ellis  DZH:[id number] DOB: 11/30/1950 DOA: 12/17/2021 ?PCP: [facility]  ? ? ?Brief Narrative:  ?Kristina Ellis is a 72 y.o. female with medical history significant for A-fib s/p TEE with cardioversion, history of bladder cancer status post transurethral resection of the tumor, s/p chemo and radiation therapy, coronary artery disease, liver cirrhosis, hypertension who presents to the ER for evaluation of abdominal pain for about 3 days.  Abdominal pain is diffuse and rated an 8 x 10 in intensity at its worst.  It is nonradiating and is associated with nausea, vomiting and diarrhea.  She complains of chills but denies having any fever. ?She complains of palpitations and shortness of breath but denies having any chest pain.  She denies having any headache, no leg swelling, no dizziness, no lightheadedness, no blurred vision or focal deficit. ?She was noted to be in A-fib with a rapid ventricular rate and received a dose of metoprolol 2.5 mg IV ? ?5/13 s/p ercp. On 5/12 by Dr. Allen Norris. ? ? ? ?Consultants:  ?Surgery, GI, IR ? ?Procedures:  ? ?Antimicrobials:  ?  ? ? ?Subjective: ?Ab pain around umbilicus. Wants to continue current diet. Some diarrhea. ? ?Objective: ?Vitals:  ? 12/18/21 2000 12/19/21 0440 12/19/21 0754 12/19/21 1217  ?BP: 130/83 137/69 (!) 140/92 (!) 128/97  ?Pulse: 89 85 94 84  ?Resp: '18 18 17 18  '$ ?Temp: 98.1 ?F (36.7 ?C) 98.6 ?F (37 ?C) 98.3 ?F (36.8 ?C) 98.7 ?F (37.1 ?C)  ?TempSrc:  Oral Oral Oral  ?SpO2: 96% 100% 100% 100%  ?Weight:      ?Height:      ? ? ?Intake/Output Summary (Last 24 hours) at 12/19/2021 1501 ?Last data filed at 12/19/2021 1301 ?Gross per 24 hour  ?Intake 1885.24 ml  ?Output --  ?Net 1885.24 ml  ? ?Filed Weights  ? 12/17/21 0842 12/17/21 2030  ?Weight: 59.4 kg 55.9 kg  ? ? ?Examination: ?Calm, NAD ?Cta no w/r ?Reg s1/s2 no gallop ?Soft benign +bs ?No edema ?Aaoxox3  ?Mood and affect appropriate in current setting  ? ? ? ?Data  Reviewed: I have personally reviewed following labs and imaging studies ? ?CBC: ?Recent Labs  ?Lab 12/17/21 ?4196 12/18/21 ?2229 12/19/21 ?0418  ?WBC 9.0 11.8* 8.0  ?HGB 14.3 11.2* 10.8*  ?HCT 42.9 32.7* 31.9*  ?MCV 106.5* 105.8* 105.3*  ?PLT 176 135* 148*  ? ?Basic Metabolic Panel: ?Recent Labs  ?Lab 12/17/21 ?1038 12/17/21 ?1252 12/18/21 ?7989 12/19/21 ?0418  ?NA 136  --  138 136  ?K 3.0*  --  3.1* 3.4*  ?CL 102  --  105 103  ?CO2 22  --  24 26  ?GLUCOSE 102*  --  82 88  ?BUN 17  --  16 19  ?CREATININE 1.10*  --  0.72 0.66  ?CALCIUM 8.1*  --  8.0* 7.9*  ?MG  --  1.5*  --   --   ? ?GFR: ?Estimated Creatinine Clearance: 49.9 mL/min (by C-G formula based on SCr of 0.66 mg/dL). ?Liver Function Tests: ?Recent Labs  ?Lab 12/17/21 ?1038 12/18/21 ?2119 12/19/21 ?0418  ?AST 46* 28 68*  ?ALT '28 16 23  '$ ?ALKPHOS 49 38 59  ?BILITOT 3.2* 2.0* 3.8*  ?PROT 8.1 6.4* 6.0*  ?ALBUMIN 2.9* 2.2* 2.0*  ? ?Recent Labs  ?Lab 12/17/21 ?1038  ?LIPASE 28  ? ?No results for input(s): AMMONIA in the last 168 hours. ?Coagulation Profile: ?Recent Labs  ?Lab 12/17/21 ?1336 12/18/21 ?0541  ?  INR 1.3* 1.3*  ? ?Cardiac Enzymes: ?No results for input(s): CKTOTAL, CKMB, CKMBINDEX, TROPONINI in the last 168 hours. ?BNP (last 3 results) ?No results for input(s): PROBNP in the last 8760 hours. ?HbA1C: ?No results for input(s): HGBA1C in the last 72 hours. ?CBG: ?Recent Labs  ?Lab 12/18/21 ?5102  ?GLUCAP 82  ? ?Lipid Profile: ?No results for input(s): CHOL, HDL, LDLCALC, TRIG, CHOLHDL, LDLDIRECT in the last 72 hours. ?Thyroid Function Tests: ?No results for input(s): TSH, T4TOTAL, FREET4, T3FREE, THYROIDAB in the last 72 hours. ?Anemia Panel: ?No results for input(s): VITAMINB12, FOLATE, FERRITIN, TIBC, IRON, RETICCTPCT in the last 72 hours. ?Sepsis Labs: ?Recent Labs  ?Lab 12/17/21 ?5852 12/17/21 ?1252 12/17/21 ?2057 12/18/21 ?0034 12/18/21 ?0541  ?PROCALCITON  --   --   --   --  1.28  ?LATICACIDVEN 3.7* 1.6 1.8 1.3  --   ? ? ?Recent Results (from the  past 240 hour(s))  ?Blood culture (routine x 2)     Status: None (Preliminary result)  ? Collection Time: 12/17/21  9:13 AM  ? Specimen: BLOOD  ?Result Value Ref Range Status  ? Specimen Description BLOOD BLOOD RIGHT HAND  Final  ? Special Requests   Final  ?  BOTTLES DRAWN AEROBIC AND ANAEROBIC Blood Culture results may not be optimal due to an inadequate volume of blood received in culture bottles  ? Culture   Final  ?  NO GROWTH 2 DAYS ?Performed at [facility], [address], [place], [place] [address] ?  ? Report Status PENDING  Incomplete  ?Blood culture (routine x 2)     Status: None (Preliminary result)  ? Collection Time: 12/17/21  9:34 AM  ? Specimen: BLOOD  ?Result Value Ref Range Status  ? Specimen Description BLOOD BLOOD RIGHT FOREARM  Final  ? Special Requests   Final  ?  BOTTLES DRAWN AEROBIC AND ANAEROBIC Blood Culture results may not be optimal due to an inadequate volume of blood received in culture bottles  ? Culture   Final  ?  NO GROWTH 2 DAYS ?Performed at [facility], [address], [place], [place] [address] ?  ? Report Status PENDING  Incomplete  ?Urine Culture     Status: Abnormal  ? Collection Time: 12/17/21  9:41 AM  ? Specimen: Urine, Clean Catch  ?Result Value Ref Range Status  ? Specimen Description   Final  ?  URINE, CLEAN CATCH ?Performed at [facility], [address], [place], [place] [address] ?  ? Special Requests   Final  ?  NONE ?Performed at [facility], [address] [place], [place] [address] ?  ? Culture MULTIPLE SPECIES PRESENT, SUGGEST RECOLLECTION (A)  Final  ? Report Status 12/18/2021 FINAL  Final  ?  ? ? ? ? ? ?Radiology Studies: ?DG C-Arm 1-60 Min-No Report ? ?Result Date: 12/18/2021 ?Fluoroscopy was utilized by the requesting physician.  No radiographic interpretation.  ? ?DG C-Arm 1-60 Min-No Report ? ?Result Date: 12/18/2021 ?Fluoroscopy was utilized by the requesting physician.  No radiographic interpretation.    ? ? ? ? ? ?Scheduled Meds: ? feeding supplement  1 Container Oral TID BM  ? [START ON 12/20/2021] feeding supplement  237 mL Oral TID BM  ? magnesium oxide  400 mg Oral BID  ? mouth rinse  15 mL Mouth Rinse BID  ? metoprolol succinate  25 mg Oral Daily  ? ?Continuous Infusions: ? diltiazem (CARDIZEM) infusion 5 mg/hr (12/19/21 1358)  ? lactated  ringers with kcl Stopped (12/19/21 1216)  ? magnesium sulfate bolus IVPB    ? piperacillin-tazobactam (ZOSYN)  IV 12.5 mL/hr at 12/19/21 1358  ? sodium chloride    ? ? ?Assessment & Plan: ?  ?Principal Problem: ?  Atrial fibrillation with RVR ([place]) ?Active Problems: ?  Sepsis ([place]) ?  Acute cholecystitis ?  UTI (urinary tract infection) ?  Hypokalemia ?  Bladder cancer ([place]) ?  Calculus of common duct ?  Duodenal stenosis ? ? ?Atrial fibrillation with RVR ([place]) ?Patient with a known history of A-fib who presented in a rapid ventricular rate. ?Hold Eliquis for procedure ?On Cardizem drip  ?Will change beta blk to bid dosing ? ?  ?UTI (urinary tract infection) ?Continue Zosyn ? ? ?Acute cholecystitis ?S/p ERCP with biliary sphincterotomy and extraction ?Patient declined percutaneous or gallbladder surgery ?5/13 continue Zosyn ?With some abdominal pain we will check lipase ?  ?Sepsis ([place]) ?As evidenced by tachycardia, tachypnea, lactic acidosis and imaging suggestive of acute cholecystitis as well as pyuria suggestive of UTI ?Aggressive IV fluid resuscitation ?5/13 plan as above.   ? ? ?  ?Hypokalemia ?Replace with KCl ?  ?Bladder cancer ([place]) ?Patient with a history of stage II bladder cancer status post TURBT. ?Status post chemo and radiation therapy ?Follow-up with oncology as an outpatient ?  ?  ? ? ?DVT prophylaxis: scd ?Code Status:full ?Family Communication: None at bedside ?Disposition Plan: Back home ?Status is: Inpatient ?Remains inpatient appropriate because: iv treatment.  ? ? ? ? LOS: 2 days  ? ?Time spent: 35 min  ? ? ? ?Nolberto Hanlon, MD ?[facility] ?Pager  [phone] ? ?If 7PM-7AM, please contact night-coverage ?12/19/2021, 3:01 PM   ?

## 2021-12-19 NOTE — Progress Notes (Signed)
RR was called on patient after she used the bathroom with PT, she had BM and per PT pt was complaining of dizziness and had syncope. Patient is with it now , answering questions appropriately. Clammy . Vitals are all stable. BG level is stable. ? ?Cta  ?Regular -irreg, s1/s2 no gallop ?Soft mild ttp mid abd. No rebound ?No edema ? ? ?A/P; ?Likely vasovagal ?Will ck ekg. ?Vitals stable, continue to monitor  ?Continue ivf ?

## 2021-12-20 ENCOUNTER — Inpatient Hospital Stay: Payer: Medicare Other

## 2021-12-20 DIAGNOSIS — K859 Acute pancreatitis without necrosis or infection, unspecified: Secondary | ICD-10-CM

## 2021-12-20 DIAGNOSIS — K9189 Other postprocedural complications and disorders of digestive system: Secondary | ICD-10-CM

## 2021-12-20 MED ORDER — IOHEXOL 9 MG/ML PO SOLN
500.0000 mL | ORAL | Status: AC
  Administered 2021-12-20: 500 mL via ORAL

## 2021-12-20 MED ORDER — LACTATED RINGERS IV SOLN
INTRAVENOUS | Status: DC
Start: 2021-12-20 — End: 2021-12-21

## 2021-12-20 MED ORDER — IOHEXOL 300 MG/ML  SOLN
100.0000 mL | Freq: Once | INTRAMUSCULAR | Status: AC | PRN
  Administered 2021-12-20: 100 mL via INTRAVENOUS

## 2021-12-20 NOTE — Evaluation (Signed)
Occupational Therapy Evaluation ?Patient Details ?Name: Kristina Ellis ?MRN: [id number] ?DOB: 02-Mar-1951 ?Today's Date: 12/20/2021 ? ? ?History of Present Illness Kristina Ellis is a 71 y.o. female with medical history significant for A-fib s/p TEE with cardioversion, history of bladder cancer status post transurethral resection of the tumor, s/p chemo and radiation therapy, coronary artery disease, liver cirrhosis, hypertension who presents to the ER for evaluation of abdominal pain for about 3 days.  She was admitted for afib with RVR, UTI, acute cholecystitis, sepsis, and hypokalemia. Patient with a history of stage II bladder cancer status post TURBT. She was found to have a large gallstone and underwent ERCP on 12/18/2021.  ? ?Clinical Impression ?  ?Pt seen for OT evaluation this date.  Pt reports she lives alone but has a son who stays with her at night "most of the time". Prior to admission she required assist with lower body dressing with putting on socks and shoes, home management tasks.  She was independent with her medication management and feeding self.  She ambulated with a cane around the home.  She now presents with increased pain in abdomen, muscle weakness, decreased transfers and functional mobility and has had some syncopal episodes while in the hospital.  Pt would benefit from skilled OT services to maximize safety and independence in necessary daily activities. Given her current abilities, she would likely benefit from STR upon discharge.  ? ?   ? ?Recommendations for follow up therapy are one component of a multi-disciplinary discharge planning process, led by the attending physician.  Recommendations may be updated based on patient status, additional functional criteria and insurance authorization.  ? ?Follow Up Recommendations ? Skilled nursing-short term rehab (<3 hours/day)  ?  ?Assistance Recommended at Discharge Frequent or constant Supervision/Assistance  ?Patient can return home with  the following A lot of help with bathing/dressing/bathroom;A little help with walking and/or transfers ? ?  ?Functional Status Assessment ? Patient has had a recent decline in their functional status and demonstrates the ability to make significant improvements in function in a reasonable and predictable amount of time.  ?Equipment Recommendations ?    ?  ?Recommendations for Other Services   ? ? ?  ?Precautions / Restrictions Precautions ?Precautions: Fall  ? ?  ? ?Mobility Bed Mobility ?Overal bed mobility: Needs Assistance ?Bed Mobility: Sit to Supine ?  ?  ?  ?  ?  ?General bed mobility comments: able to roll in bed, declined out of bed transfer this date. ?  ? ?Transfers ?  ?  ?  ?  ?  ?  ?  ?  ?  ?General transfer comment: To be further assessed next session, pt declined due to drinking fluids for testing this date, getting ready for test. ?  ? ?  ?Balance   ?  ?  ?  ?  ?  ?  ?  ?  ?  ?  ?  ?  ?  ?  ?  ?  ?  ?  ?   ? ?ADL either performed or assessed with clinical judgement  ? ?ADL Overall ADL's : Needs assistance/impaired ?Eating/Feeding: Modified independent ?  ?Grooming: Set up;Bed level ?  ?Upper Body Bathing: Set up ?  ?Lower Body Bathing: Moderate assistance ?  ?Upper Body Dressing : Set up ?  ?Lower Body Dressing: Moderate assistance ?  ?  ?  ?  ?  ?  ?  ?  ?General ADL Comments: Pt reports she just  drank fluids for testing and declined out of bed activity since she reports this causes her to have to have a BM  ? ? ? ?Vision Baseline Vision/History: 1 Wears glasses ?Patient Visual Report: No change from baseline ?   ?   ?Perception   ?  ?Praxis   ?  ? ?Pertinent Vitals/Pain Pain Assessment ?Pain Assessment: 0-10 ?Pain Score: 5  ?Pain Location: abdomen ?Pain Descriptors / Indicators: Aching ?Pain Intervention(s): Limited activity within patient's tolerance, Monitored during session  ? ? ? ?Hand Dominance Right ?  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 ?  ?Lower Extremity Assessment ?Lower Extremity Assessment: Defer to PT evaluation ?  ?  ?  ?Communication Communication ?Communication: No difficulties ?  ?Cognition Arousal/Alertness: Awake/alert ?Behavior During Therapy: [facility] for tasks assessed/performed ?Overall Cognitive Status: Within Functional Limits for tasks assessed ?  ?  ?  ?  ?  ?  ?  ?  ?  ?  ?  ?  ?  ?  ?  ?  ?  ?  ?  ?General Comments    ? ?  ?Exercises   ?  ?Shoulder Instructions    ? ? ?Home Living Family/patient expects to be discharged to:: Private residence ?Living Arrangements: Alone (She reports her son stays with her most nights) ?Available Help at Discharge: Available PRN/intermittently;Family ?Type of Home: Apartment ?Home Access: Level entry ?  ?  ?Home Layout: One level ?  ?  ?Bathroom Shower/Tub: Tub/shower unit ?  ?Bathroom Toilet: Standard ?Bathroom Accessibility: No ?  ?Home Equipment: Kasandra Knudsen - single point;Shower seat ?  ?  ?  ? ?  ?Prior Functioning/Environment Prior Level of Function : Needs assist ?  ?  ?  ?  ?  ?  ?Mobility Comments: Pt denies any falls in the last year, reports she uses a cane at home with ambulation ?ADLs Comments: Pt reports her son stays with her a night most nights, helps with the household tasks, cooking and driving.  She reports she requires assist with lower body dressing and her son or sister in law helps. ?  ? ?  ?  ?OT Problem List: Decreased strength;Decreased activity tolerance;Pain;Decreased range of motion ?  ?   ?OT Treatment/Interventions: Self-care/ADL training;Energy conservation;Therapeutic exercise;Therapeutic  activities;Patient/family education  ?  ?OT Goals(Current goals can be found in the care plan section) Acute Rehab OT Goals ?Patient Stated Goal: to get better, take care of myself ?OT Goal Formulation: With patient ?Time For Goal Achievement: 01/03/22 ?Potential to Achieve Goals: Good ?ADL Goals ?Pt Will Perform Lower Body Dressing: with modified independence ?Pt Will Transfer to Toilet: with modified independence  ?OT Frequency: Min 2X/week ?  ? ?Co-evaluation   ?  ?  ?  ?  ? ?  ?AM-PAC OT "6 Clicks" Daily Activity     ?Outcome Measure Help from another person eating meals?: None ?Help from another person taking care of personal grooming?: A Little ?Help from another person toileting, which includes using toliet, bedpan, or urinal?: A Little ?Help from another person bathing (including washing, rinsing, drying)?: A Lot ?Help from another person to put on and taking  off regular upper body clothing?: A Little ?Help from another person to put on and taking off regular lower body clothing?: A Lot ?6 Click Score: 17 ?  ?End of Session   ? ?Activity Tolerance: Patient limited by pain ?Patient left: in bed;with bed alarm set ? ?OT Visit Diagnosis: Muscle weakness (generalized) (M62.81);Pain ?Pain - part of body:  (Abdomen)  ?              ?Time: 1022-1040 ?OT Time Calculation (min): 18 min ?Charges:  OT General Charges ?$OT Visit: 1 Visit ?OT Evaluation ?$OT Eval Low Complexity: 1 Low ?Ferrin Liebig Oneita Jolly, OTR/L, CLT ?12/20/2021, 11:38 AM ?

## 2021-12-20 NOTE — Progress Notes (Signed)
? ?[name] , MD ?[address] [place], [place], [place], [address] ?[address] [place], [place], [place], [address] ?Phone: [phone]  ?Fax: [phone] ? ? ?Kristina Ellis is being followed for choledocholithiasis status post ERCP ? ?Subjective: ?Had some epigastric pain yesterday , very minimal today , feels better, denies any fevers ? ? ?Objective: ?Vital signs in last 24 hours: ?Vitals:  ? 12/19/21 2055 12/19/21 2338 12/20/21 0340 12/20/21 0751  ?BP: 135/75 134/83 (!) 141/81 (!) 148/92  ?Pulse: 88 82 98 75  ?Resp: '16 18 20 18  '$ ?Temp: 98.6 ?F (37 ?C) 99.2 ?F (37.3 ?C) 99 ?F (37.2 ?C) 97.9 ?F (36.6 ?C)  ?TempSrc: Oral Oral Oral Oral  ?SpO2: 98% 96% 98% 100%  ?Weight:      ?Height:      ? ?Weight change:  ? ?Intake/Output Summary (Last 24 hours) at 12/20/2021 0846 ?Last data filed at 12/20/2021 0701 ?Gross per 24 hour  ?Intake 1906.77 ml  ?Output 250 ml  ?Net 1656.77 ml  ? ? ? ?Exam: ? ?Abdomen: soft, nontender, normal bowel sounds ? ? ?Lab Results: ?'@LABTEST2'$ @ ?Micro Results: ?Recent Results (from the past 240 hour(s))  ?Blood culture (routine x 2)     Status: None (Preliminary result)  ? Collection Time: 12/17/21  9:13 AM  ? Specimen: BLOOD  ?Result Value Ref Range Status  ? Specimen Description BLOOD BLOOD RIGHT HAND  Final  ? Special Requests   Final  ?  BOTTLES DRAWN AEROBIC AND ANAEROBIC Blood Culture results may not be optimal due to an inadequate volume of blood received in culture bottles  ? Culture   Final  ?  NO GROWTH 3 DAYS ?Performed at [facility], [address] [place], [place] [address] ?  ? Report Status PENDING  Incomplete  ?Blood culture (routine x 2)     Status: None (Preliminary result)  ? Collection Time: 12/17/21  9:34 AM  ? Specimen: BLOOD  ?Result Value Ref Range Status  ? Specimen Description BLOOD BLOOD RIGHT FOREARM  Final  ? Special Requests   Final  ?  BOTTLES DRAWN AEROBIC AND ANAEROBIC Blood Culture results may not be optimal due to an inadequate volume  of blood received in culture bottles  ? Culture   Final  ?  NO GROWTH 3 DAYS ?Performed at [facility], [address], [place], [place] [address] ?  ? Report Status PENDING  Incomplete  ?Urine Culture     Status: Abnormal  ? Collection Time: 12/17/21  9:41 AM  ? Specimen: Urine, Clean Catch  ?Result Value Ref Range Status  ? Specimen Description   Final  ?  URINE, CLEAN CATCH ?Performed at [facility], [address] [place], [place] [address] ?  ? Special Requests   Final  ?  NONE ?Performed at [facility], [address] [place], [place] [address] ?  ? Culture MULTIPLE SPECIES PRESENT, SUGGEST RECOLLECTION (A)  Final  ? Report Status 12/18/2021 FINAL  Final  ? ?Studies/Results: ?DG C-Arm 1-60 Min-No Report ? ?Result Date: 12/18/2021 ?Fluoroscopy was utilized by the requesting physician.  No radiographic interpretation.  ? ?DG C-Arm 1-60 Min-No Report ? ?Result Date: 12/18/2021 ?Fluoroscopy was utilized by the requesting physician.  No radiographic interpretation.   ?Medications: I have reviewed the patient's current medications. ?Scheduled Meds: ? acidophilus  2 capsule Oral BID  ? feeding supplement  1 Container Oral TID BM  ? feeding supplement  237 mL Oral TID BM  ?  magnesium oxide  400 mg Oral BID  ? mouth rinse  15 mL Mouth Rinse BID  ? metoprolol succinate  25 mg Oral BID  ? ?Continuous Infusions: ? diltiazem (CARDIZEM) infusion 5 mg/hr (12/20/21 0759)  ? lactated ringers with kcl 100 mL/hr at 12/20/21 0759  ? lactated ringers    ? magnesium sulfate bolus IVPB    ? piperacillin-tazobactam (ZOSYN)  IV 12.5 mL/hr at 12/20/21 0759  ? sodium chloride    ? ?PRN Meds:.morphine injection, ondansetron **OR** ondansetron (ZOFRAN) IV ? ? ?Assessment: ?Principal Problem: ?  Atrial fibrillation with RVR ([place]) ?Active Problems: ?  Bladder cancer ([place]) ?  Hypokalemia ?  Sepsis ([place]) ?  Acute cholecystitis ?  UTI (urinary tract infection) ?  Calculus of common duct ?  Duodenal  stenosis ? ?Kristina Ellis 71 y.o. female admitted with cholangitis secondary to choledocholithiasis and cholecystitis.  History of liver cirrhosis.S/p ERCP on 12/18/2021 the biliary tree was cleared of choledocholithiasis after sphincterotomy and balloon extraction.  Dr. Kurtis Bushman discussed with me last evening and informed me that he was having some pain I requested to check the lipaseWhich was elevated at 299 indicating possibly developed some post ERCP pancreatitis.appears to be doing better today  ? ?Plan: ?Observe clinically, serial abdominal examinations.  Conservative management with IV fluids and analgesia.  There is no utility in rechecking the lipase as it does not help with determining if the pancreatitis is getting better or worse it is more of a clinical diagnosis .  ?Surgery has seen the patient for cholecystectomy and recommended an IR guided tube.  The patient declined percutaneous of gallbladder surgery ?Advance diet as tolerated  ? ? LOS: 3 days  ? ?[name], MD ?12/20/2021, 8:46 AM  ?

## 2021-12-20 NOTE — Plan of Care (Signed)

## 2021-12-20 NOTE — Progress Notes (Signed)
Chaplain responded to Rapid Response.  RN indicated that pt had episode of syncope. No family is present.   ? ?Please contact if support is needed. ? ?Minus Liberty, Chaplain ?Pager:  [phone] ? ? ? 12/19/21 1529  ?Clinical Encounter Type  ?Visited With Patient not available  ?Visit Type Initial;Critical Care  ?Referral From Nurse  ?Consult/Referral To Chaplain  ?Stress Factors  ?Patient Stress Factors Health changes  ? ? ?

## 2021-12-20 NOTE — Progress Notes (Deleted)
[place]  [phone]) [phone] [phone]) [phone]  ID: Kristina Ellis OB: 10/29/1950  MR#: [id number]  TZG#:[id number]  Patient Care Team: [facility] [place] as PCP - General (General Practice) Lloyd Huger, MD as Consulting Physician (Hematology and Oncology)  CHIEF COMPLAINT: Stage II bladder cancer.  INTERVAL HISTORY: Patient returns to clinic today for repeat laboratory work and further evaluation.  She recently underwent cystoscopy that revealed no evidence of disease.  She continues to have left arm discomfort from her fall recently, but otherwise feels well.  She continues to have chronic weakness and fatigue.  She denies any other pain.  She has no neurologic complaints.  She denies any recent fevers or illnesses.  She has a good appetite and denies weight loss.  She has no chest pain, shortness of breath, cough, or hemoptysis.  She denies any nausea, vomiting, constipation, or diarrhea.  She has no urinary complaints.  Patient offers no further specific complaints today.  REVIEW OF SYSTEMS:   Review of Systems  Constitutional:  Positive for malaise/fatigue. Negative for fever and weight loss.  Respiratory: Negative.  Negative for cough, hemoptysis and shortness of breath.   Cardiovascular: Negative.  Negative for chest pain and leg swelling.  Gastrointestinal: Negative.  Negative for abdominal pain.  Genitourinary:  Negative for hematuria.  Musculoskeletal:  Positive for joint pain. Negative for back pain and falls.  Skin: Negative.  Negative for rash.  Neurological:  Positive for weakness. Negative for dizziness, seizures and headaches.  Psychiatric/Behavioral: Negative.  The patient is not nervous/anxious.    As per HPI. Otherwise, a complete review of systems is negative.  PAST MEDICAL HISTORY: Past Medical History:  Diagnosis Date   Aortic atherosclerosis ([place])    Arthritis    Atrial fibrillation ([place])    a.) s/p TEE  with cardioversion on 02/04/2020. b.) on daily apixaban.   Bladder mass    a.) CT 04/08/2021 --> 3.2 cm intraluminal bladder mass.   Carotid stenosis, bilateral    a.) Doppler 07/04/2020 --> mild; 1-49% stenosis BILATERALLY.   Cholelithiasis    a.) CT 11/06/2020 --> largest measured 4.5 cm   Chronic anticoagulation    a.) Apixaban   Common biliary duct calculus    a.) CT 04/08/2021 --> CBD dilated at 8 mm; 3m calculus within distal CBD.   Coronary artery disease involving native coronary artery of native heart without angina pectoris 57/59/1638  Diastolic dysfunction    a.) TTE 07/04/2020 --> LVEF 60-65%; G1DD.   Hepatic cirrhosis (H[facility])    Hepatic steatosis    Hepatitis 1970   History of 2019 novel coronavirus disease (COVID-19) 12/20/2019   History of marijuana use    Hypertension    Hypertensive retinopathy    IDA (iron deficiency anemia)    Mitral stenosis    a.) TEE 04/10/2020 --> mild. b.) TTE 07/04/2020 --> EF 60-65%; mild (mean gradient 5 mmHg).   Moderate pulmonary arterial systolic hypertension ([facility] 12/22/2019   Nephrolithiasis    NSTEMI (non-ST elevated myocardial infarction) ([facility] 07/26/2016   Open-angle glaucoma 09/11/2010   PAH (pulmonary artery hypertension) ([place]    a.) TTE 12/21/2019 --> PASP 44 mmHg.   Uterine fibroid    a.) CT 04/08/2021 --> multiple with largest measuring 7 cm.   Valvular regurgitation    a.) TTE 09/01/2010 --> trivial to mild pan-valvular. b.) TTE 12/21/2019 --> mild MR and AR; moderate TR. c.) TTE 07/04/2020 --> mild TR; trivial MR and PR.  Vestibular neuronitis     PAST SURGICAL HISTORY: Past Surgical History:  Procedure Laterality Date   BREAST CYST ASPIRATION Left    COLONOSCOPY  2012   ERCP N/A 12/18/2021   Procedure: ENDOSCOPIC RETROGRADE CHOLANGIOPANCREATOGRAPHY (ERCP);  Surgeon: Lucilla Lame, MD;  Location: [facility];  Service: Endoscopy;  Laterality: N/A;   TEE WITH CARDIOVERSION N/A 02/04/2020   Procedure: TEE WITH  CARDIOVERSION; Location: [facility]; Surgeon: Kandis Cocking, MD   TRANSURETHRAL RESECTION OF BLADDER TUMOR N/A 05/15/2021   Procedure: TRANSURETHRAL RESECTION OF BLADDER TUMOR (TURBT);  Surgeon: Billey Co, MD;  Location: [facility] ORS;  Service: Urology;  Laterality: N/A;    FAMILY HISTORY: Family History  Problem Relation Age of Onset   Aneurysm Mother    Colon cancer Father    Lung cancer Brother    Breast cancer Neg Hx     ADVANCED DIRECTIVES (Y/N):  N  HEALTH MAINTENANCE: Social History   Tobacco Use   Smoking status: Former    Packs/day: 0.10    Years: 10.00    Pack years: 1.00    Types: Cigarettes   Smokeless tobacco: Never   Tobacco comments:    occasional smoker  Vaping Use   Vaping Use: Never used  Substance Use Topics   Alcohol use: Yes    Alcohol/week: 5.0 standard drinks    Types: 5 Cans of beer per week    Comment: weekly   Drug use: Not Currently    Types: 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   No current outpatient medications on file.   Facility-Administered Medications Ordered in Other Visits  Medication Dose Route Frequency Provider Last Rate Last Admin   acidophilus (RISAQUAD) capsule 2 capsule  2 capsule Oral BID Nolberto Hanlon, MD   2 capsule at 12/20/21 0808   diltiazem (CARDIZEM) 125 mg in dextrose 5% 125 mL (1 mg/mL) infusion  5-15 mg/hr Intravenous Continuous Lucilla Lame, MD 5 mL/hr at 12/20/21 1518 5 mg/hr at 12/20/21 1518   feeding supplement (ENSURE ENLIVE / ENSURE PLUS) liquid 237 mL  237 mL Oral TID BM Nolberto Hanlon, MD       lactated ringers infusion   Intravenous Continuous Nolberto Hanlon, MD 75 mL/hr at 12/20/21 1518 Infusion Verify at 12/20/21 1518   magnesium oxide (MAG-OX) tablet 400 mg  400 mg Oral BID  Lucilla Lame, MD   400 mg at 12/20/21 8916   MEDLINE mouth rinse  15 mL Mouth Rinse BID Lucilla Lame, MD   15 mL at 12/20/21 0809   metoprolol succinate (TOPROL-XL) 24 hr tablet 25 mg  25 mg Oral BID Nolberto Hanlon, MD   25 mg at 12/20/21 9450   morphine (PF) 2 MG/ML injection 1 mg  1 mg Intravenous Q4H PRN Lucilla Lame, MD   1 mg at 12/20/21 1447   ondansetron (ZOFRAN) tablet 4 mg  4 mg Oral Q6H PRN Lucilla Lame, MD       Or   ondansetron (ZOFRAN) injection 4 mg  4 mg Intravenous Q6H PRN Lucilla Lame, MD       piperacillin-tazobactam (ZOSYN) IVPB 3.375 g  3.375 g Intravenous Meribeth Mattes, MD 12.5 mL/hr at 12/20/21 1518 Infusion Verify at 12/20/21 1518    OBJECTIVE:  There were no vitals filed for this visit.    There is no height or weight on file to calculate BMI.    ECOG FS:1 - Symptomatic but completely ambulatory  General: Well-developed, well-nourished, no acute distress.  Sitting in a wheelchair. Eyes: Pink conjunctiva, anicteric sclera. HEENT: Normocephalic, moist mucous membranes. Lungs: No audible wheezing or coughing. Heart: Regular rate and rhythm. Abdomen: Soft, nontender, no obvious distention. Musculoskeletal: No edema, cyanosis, or clubbing. Neuro: Alert, answering all questions appropriately. Cranial nerves grossly intact. Skin: No rashes or petechiae noted. Psych: Normal affect.   LAB RESULTS:  Lab Results  Component Value Date   NA 136 12/19/2021   K 3.4 (L) 12/19/2021   CL 103 12/19/2021   CO2 26 12/19/2021   GLUCOSE 88 12/19/2021   BUN 19 12/19/2021   CREATININE 0.66 12/19/2021   CALCIUM 7.9 (L) 12/19/2021   PROT 6.0 (L) 12/19/2021   ALBUMIN 2.0 (L) 12/19/2021   AST 68 (H) 12/19/2021   ALT 23 12/19/2021   ALKPHOS 59 12/19/2021   BILITOT 3.8 (H) 12/19/2021   GFRNONAA >60 12/19/2021   GFRAA >60 03/07/2018    Lab Results  Component Value Date   WBC 8.0 12/19/2021   NEUTROABS 0.7 (L) 10/08/2021   HGB 10.8 (L) 12/19/2021   HCT 31.9 (L) 12/19/2021    MCV 105.3 (H) 12/19/2021   PLT 148 (L) 12/19/2021     STUDIES: CT ABDOMEN PELVIS W CONTRAST  Result Date: 12/20/2021 CLINICAL DATA:  History of bladder cancer, status post TURP. Nonlocalized abdominal pain after ERCP. EXAM: CT ABDOMEN AND PELVIS WITH CONTRAST TECHNIQUE: Multidetector CT imaging of the abdomen and pelvis was performed using the standard protocol following bolus administration of intravenous contrast. RADIATION DOSE REDUCTION: This exam was performed according to the departmental dose-optimization program which includes automated exposure control, adjustment of the mA and/or kV according to patient size and/or use of iterative reconstruction technique. CONTRAST:  159m OMNIPAQUE IOHEXOL 300 MG/ML  SOLN COMPARISON:  12/17/2021 FINDINGS: Lower chest: Bibasilar collapse/consolidation in the lower lobes, left greater than right with small left and tiny right pleural effusions. Hepatobiliary: Nodular liver parenchyma and contour is compatible with cirrhosis. Markedly large calcified stone is again identified in the gallbladder with presumed layering stone in the fundus. Gallbladder wall thickening is similar to prior. Common bile duct measures upper normal at 6 mm in the head of the pancreas. The tiny calcified stone seen previously in the distal common bile duct are not evident today. Pancreas: No focal mass lesion. No dilatation of the main duct. No intraparenchymal cyst. No peripancreatic edema. Spleen: No splenomegaly. No focal mass lesion. Adrenals/Urinary Tract: No adrenal nodule or mass. Kidneys unremarkable. No evidence for hydroureter. The urinary bladder appears normal for the degree of distention. Stomach/Bowel: Stomach is unremarkable. No gastric wall thickening. No evidence of outlet obstruction. Duodenum is normally positioned as is the ligament of Treitz. No small bowel wall thickening. No small bowel dilatation. The terminal ileum is normal. The appendix is not well visualized,  but there is no edema or inflammation in the region of the cecum. No gross colonic mass. No colonic wall thickening. Vascular/Lymphatic: There is moderate atherosclerotic calcification of the abdominal aorta without aneurysm. 12 mm short axis gastrohepatic ligament lymph node on 24/2 is similar to prior. 9 mm short axis node adjacent to the left adrenal gland on 25/2 is unchanged. Stable 10 mm short axis left para-aortic node on 37/2. No pelvic sidewall lymphadenopathy. Reproductive: Bulky uterine fibroid evident, as  before. Other scattered small fibroids noted. There is no adnexal mass. Other: Small to moderate volume ascites is progressive in the interval. Musculoskeletal: Diffuse body wall edema is progressive since the prior study. No worrisome lytic or sclerotic osseous abnormality. Degenerative disc disease noted mid and lower lumbar spine. IMPRESSION: 1. Mild gastrohepatic ligament lymphadenopathy and borderline enlarged retroperitoneal lymph nodes are similar to 12/17/2021 but new since PET-CT of 06/17/2021. Given the history of bladder cancer, close follow-up recommended to exclude metastatic disease. 2. Interval progression of small to moderate volume ascites with diffuse body wall edema. 3. Bibasilar collapse/consolidation, left greater than right with small left and tiny right pleural effusions. 4. Cirrhotic liver morphology. 5. Cholelithiasis with similar appearance of gallbladder wall thickening. Large calcified gallstone evident. 6. The tiny calcified stones seen previously in the distal common bile duct are not evident today. 7. Bulky uterine fibroid disease, as before. 8. Aortic Atherosclerosis (ICD10-I70.0). Electronically Signed   By: Misty Stanley M.D.   On: 12/20/2021 13:25   CT ABDOMEN PELVIS W CONTRAST  Result Date: 12/17/2021 CLINICAL DATA:  Abdominal pain, acute, nonlocalized EXAM: CT ABDOMEN AND PELVIS WITH CONTRAST TECHNIQUE: Multidetector CT imaging of the abdomen and pelvis was  performed using the standard protocol following bolus administration of intravenous contrast. RADIATION DOSE REDUCTION: This exam was performed according to the departmental dose-optimization program which includes automated exposure control, adjustment of the mA and/or kV according to patient size and/or use of iterative reconstruction technique. CONTRAST:  116m OMNIPAQUE IOHEXOL 300 MG/ML  SOLN COMPARISON:  CT 04/08/2021, PET-CT 06/17/2021 FINDINGS: Lower chest: Extensive mitral annular calcifications. No acute abnormality. Hepatobiliary: Hepatic steatosis with nodular contour suggesting cirrhosis. There is an a large intraluminal gallstone seen on prior exam measuring up to 5.3 cm. There is distension of the gallbladder with marked gallbladder wall thickening. There is layering hyperdense material in the gallbladder fundus. There are tiny stones in the distal common bile duct (series 2, image 26, with mild upstream prominence of the biliary ducts. Pancreas: Unremarkable. No pancreatic ductal dilatation or surrounding inflammatory changes. Spleen: Normal in size without focal abnormality. Adrenals/Urinary Tract: Adrenal glands are unremarkable. Punctate nonobstructive stone in the right mid kidney. No hydronephrosis. The bladder is moderately distended. Stomach/Bowel: The stomach is within normal limits. There is no evidence of bowel obstruction.The appendix is normal. Vascular/Lymphatic: Aorta iliac atherosclerosis. No AAA. No lymphadenopathy. Reproductive: There is a large heterogeneously enhancing posterior uterine the mass measuring up to 8.1 x 5.8 cm. Additional uterine fibroids noted. Other: There is a prominent left periaortic lymph node measuring up to 0.8 cm short axis, nonspecific. There is trace abdominopelvic ascites. Musculoskeletal: No acute osseous abnormality. No suspicious lytic or blastic lesions. There are multilevel degenerative changes of the spine, most severe at L4-L5 and L5-S1. IMPRESSION:  Hepatic cirrhosis and steatosis with trace abdominopelvic ascites. Dilated gallbladder with marked gallbladder wall thickening and large intraluminal stone measuring up to 5.3 cm. Correlate with physical exam and laboratory findings for signs of cholecystitis. Tiny stones in the distal common bile duct, similar to prior exams, with mild upstream biliary ductal prominence. Correlate with LFTs. Prominent left periaortic lymph node measuring up to 0.8 cm short axis, nonspecific but new since the prior PET-CT. Recommend attention on follow-up surveillance imaging. No evidence of bowel obstruction.  Normal appendix. Large heterogeneous enhancing posterior uterine mass measuring up to 8.1 cm, likely leiomyoma. Correlate with any history of abnormal uterine bleeding. Electronically Signed   By: JMaurine SimmeringM.D.   On:  12/17/2021 12:40   DG C-Arm 1-60 Min-No Report  Result Date: 12/18/2021 Fluoroscopy was utilized by the requesting physician.  No radiographic interpretation.   DG C-Arm 1-60 Min-No Report  Result Date: 12/18/2021 Fluoroscopy was utilized by the requesting physician.  No radiographic interpretation.    ASSESSMENT: Stage II bladder cancer.  PLAN:    Stage II bladder cancer: Pathology and imaging reviewed independently.  PET scan results from June 18, 2021 reviewed independently with no specific findings of active malignancy.  Since patient is not a surgical candidate she underwent TURBT.  Patient did not have port placement.   Patient received her fourth and last dose of cisplatin on August 04, 2021.  She subsequently completed XRT on August 19, 2021.  Cystoscopy on October 01, 2021 did not reveal any evidence of malignancy.  No intervention is needed at this time.  Repeat CT scan of abdomen and pelvis in mid May with follow-up 1 to 2 days later.  Patient's next cystoscopy is scheduled at the end of May.   Hypomagnesia: Chronic and unchanged.  Patient declined IV magnesium today.   Continue oral magnesium supplementation.   Neutropenia: Patient's total white blood cell count is 1.4 with an ANC of 0.7.  Unclear etiology.  Can consider bone marrow biopsy in the future to further evaluate. Anemia: Hemoglobin improving and trending up to 11.6.  Monitor.   Thrombocytopenia: Platelet count has trended down to 89.  Consider bone marrow biopsy as above.   Fall/shoulder injury: Follow-up with orthopedics as directed. Hypokalemia: Patient has refused IV potassium today.  She was given a prescription for oral potassium supplementation.   Patient expressed understanding and was in agreement with this plan. She also understands that She can call clinic at any time with any questions, concerns, or complaints.    Cancer Staging  Bladder cancer [facility]) Staging form: Urinary Bladder, AJCC 8th Edition - Clinical stage from 06/04/2021: Stage II (cT2, cN0, cM0) - Signed by Lloyd Huger, MD on 06/04/2021 WHO/ISUP grade (low/high): High Grade Histologic grading system: 2 grade system  Lloyd Huger, MD   12/20/2021 7:17 PM

## 2021-12-21 LAB — CBC
HCT: 33.4 % — ABNORMAL LOW (ref 36.0–46.0)
Hemoglobin: 11.9 g/dL — ABNORMAL LOW (ref 12.0–15.0)
MCH: 36.4 pg — ABNORMAL HIGH (ref 26.0–34.0)
MCHC: 35.6 g/dL (ref 30.0–36.0)
MCV: 102.1 fL — ABNORMAL HIGH (ref 80.0–100.0)
Platelets: 180 10*3/uL (ref 150–400)
RBC: 3.27 MIL/uL — ABNORMAL LOW (ref 3.87–5.11)
RDW: 12.7 % (ref 11.5–15.5)
WBC: 3.9 10*3/uL — ABNORMAL LOW (ref 4.0–10.5)
nRBC: 0 % (ref 0.0–0.2)

## 2021-12-21 MED ORDER — DILTIAZEM HCL 30 MG PO TABS
30.0000 mg | ORAL_TABLET | Freq: Four times a day (QID) | ORAL | Status: DC
  Administered 2021-12-21 – 2021-12-22 (×4): 30 mg via ORAL
  Filled 2021-12-21 (×4): qty 1

## 2021-12-21 NOTE — Progress Notes (Signed)
PT Cancellation Note ? ?Patient Details ?Name: Kristina Ellis ?MRN: [id number] ?DOB: 12-29-1950 ? ? ?Cancelled Treatment:    Reason Eval/Treat Not Completed: Other (comment). Pt adamantly refusing any OOB mobility at this time as well as education on importance of continued mobility during hospital admission. Stated she is going home tomorrow regardless of what is happening. RN notified of pt status and request for pain medication.  ? ?Lieutenant Diego PT, DPT ?10:34 AM,12/21/21 ? ?

## 2021-12-21 NOTE — Progress Notes (Signed)
PT Cancellation Note ? ?Patient Details ?Name: Kristina Ellis ?MRN: [id number] ?DOB: 03-08-1951 ? ? ?Cancelled Treatment:    Reason Eval/Treat Not Completed: Other (comment). Pt eating breakfast at this time, declined PT. PT to re-attempt as able.  ? ? ?Lieutenant Diego PT, DPT ?9:18 AM,12/21/21 ? ?

## 2021-12-21 NOTE — Progress Notes (Signed)
Assumed care of pt at 1900. A&O x4. RA. C/o moderate/severe pain in abdomen overnight. Medication administration per MAR. Controlled afib on tele, cardizem gtt remains infusing per MAR. Call bell within reach, making needs known.  ?

## 2021-12-21 NOTE — Progress Notes (Signed)
? Inpatient Follow-up/Progress Note ?  ?Patient ID: Kristina Ellis is a 71 y.o. female. ? ?Overnight Events / Subjective Findings ?NAEON. Pt resting in bed comfortably. Abdominal pain has resolved.  ?Feels food runs through her quickly, hoping to advance diet from soft food. ?Has declined surgical and IR intervention at this time for cholecystitis ?Rate controlled today. ?No other acute gi complaints. ? ?Review of Systems  ?Constitutional:  Negative for chills and fever.  ?HENT:  Negative for trouble swallowing.   ?Cardiovascular:  Negative for chest pain.  ?Gastrointestinal:  Negative for abdominal distention, abdominal pain, blood in stool, constipation, diarrhea, nausea and vomiting.  ?Skin:  Negative for color change and pallor.  ?Psychiatric/Behavioral:  Negative for confusion.    ? ?Medications ? ?Current Facility-Administered Medications:  ?  acidophilus (RISAQUAD) capsule 2 capsule, 2 capsule, Oral, BID, Nolberto Hanlon, MD, 2 capsule at 12/21/21 0830 ?  [COMPLETED] diltiazem (CARDIZEM) 1 mg/mL load via infusion 10 mg, 10 mg, Intravenous, Once, 10 mg at 12/17/21 1734 **AND** diltiazem (CARDIZEM) 125 mg in dextrose 5% 125 mL (1 mg/mL) infusion, 5-15 mg/hr, Intravenous, Continuous, Wohl, Darren, MD, Last Rate: 5 mL/hr at 12/21/21 0937, 5 mg/hr at 12/21/21 0937 ?  feeding supplement (ENSURE ENLIVE / ENSURE PLUS) liquid 237 mL, 237 mL, Oral, TID BM, Amery, Sahar, MD ?  magnesium oxide (MAG-OX) tablet 400 mg, 400 mg, Oral, BID, Allen Norris, Darren, MD, 400 mg at 12/21/21 0830 ?  MEDLINE mouth rinse, 15 mL, Mouth Rinse, BID, Allen Norris, Darren, MD, 15 mL at 12/21/21 0831 ?  metoprolol succinate (TOPROL-XL) 24 hr tablet 25 mg, 25 mg, Oral, BID, Amery, Sahar, MD, 25 mg at 12/21/21 0830 ?  morphine (PF) 2 MG/ML injection 1 mg, 1 mg, Intravenous, Q4H PRN, Lucilla Lame, MD, 1 mg at 12/21/21 1041 ?  ondansetron (ZOFRAN) tablet 4 mg, 4 mg, Oral, Q6H PRN **OR** ondansetron (ZOFRAN) injection 4 mg, 4 mg, Intravenous, Q6H PRN, Allen Norris,  Darren, MD ?  piperacillin-tazobactam (ZOSYN) IVPB 3.375 g, 3.375 g, Intravenous, Q8H, Lucilla Lame, MD, Stopped at 12/21/21 [phone] ? diltiazem (CARDIZEM) infusion 5 mg/hr (12/21/21 7106)  ? piperacillin-tazobactam (ZOSYN)  IV Stopped (12/21/21 2694)  ?  ?morphine injection, ondansetron **OR** ondansetron (ZOFRAN) IV  ? ?Objective  ? ? ?Vitals:  ? 12/20/21 2324 12/21/21 0354 12/21/21 0825 12/21/21 1136  ?BP: 136/85 (!) 152/94 (!) 151/80 (!) 147/75  ?Pulse: 85 75 68 (!) 106  ?Resp: 16  17   ?Temp: 98 ?F (36.7 ?C) 97.7 ?F (36.5 ?C) 97.8 ?F (36.6 ?C)   ?TempSrc: Oral Oral Oral   ?SpO2: 99% 100% 98% 97%  ?Weight:      ?Height:      ? ? ? ?Physical Exam ?Vitals and nursing note reviewed.  ?Constitutional:   ?   General: She is not in acute distress. ?   Appearance: Normal appearance. She is well-developed. She is not ill-appearing, toxic-appearing or diaphoretic.  ?HENT:  ?   Head: Normocephalic and atraumatic.  ?   Nose: Nose normal.  ?   Mouth/Throat:  ?   Mouth: Mucous membranes are moist.  ?   Pharynx: Oropharynx is clear.  ?Eyes:  ?   General: No scleral icterus. ?   Extraocular Movements: Extraocular movements intact.  ?Cardiovascular:  ?   Rate and Rhythm: Normal rate. Rhythm irregular.  ?   Heart sounds: Normal heart sounds. No murmur heard. ?  No friction rub. No gallop.  ?Pulmonary:  ?   Effort: Pulmonary effort is normal. No  respiratory distress.  ?   Breath sounds: Normal breath sounds. No wheezing, rhonchi or rales.  ?Abdominal:  ?   General: Bowel sounds are normal. There is no distension.  ?   Palpations: Abdomen is soft.  ?   Tenderness: There is no abdominal tenderness. There is no guarding or rebound.  ?Musculoskeletal:  ?   Cervical back: Neck supple.  ?   Right lower leg: No edema.  ?   Left lower leg: No edema.  ?Skin: ?   General: Skin is warm and dry.  ?   Coloration: Skin is not jaundiced or pale.  ?Neurological:  ?   General: No focal deficit present.  ?   Mental Status: She is alert and oriented  to person, place, and time. Mental status is at baseline.  ?Psychiatric:     ?   Mood and Affect: Mood normal.     ?   Behavior: Behavior normal.     ?   Thought Content: Thought content normal.     ?   Judgment: Judgment normal.  ? ? ? ?Laboratory Data ?Recent Labs  ?Lab 12/17/21 ?9381 12/18/21 ?8299 12/19/21 ?0418  ?WBC 9.0 11.8* 8.0  ?HGB 14.3 11.2* 10.8*  ?HCT 42.9 32.7* 31.9*  ?PLT 176 135* 148*  ? ? ?Recent Labs  ?Lab 12/17/21 ?1038 12/18/21 ?3716 12/19/21 ?0418  ?NA 136 138 136  ?K 3.0* 3.1* 3.4*  ?CL 102 105 103  ?CO2 '22 24 26  '$ ?BUN '17 16 19  '$ ?CREATININE 1.10* 0.72 0.66  ?CALCIUM 8.1* 8.0* 7.9*  ?PROT 8.1 6.4* 6.0*  ?BILITOT 3.2* 2.0* 3.8*  ?ALKPHOS 49 38 59  ?ALT '28 16 23  '$ ?AST 46* 28 68*  ?GLUCOSE 102* 82 88  ? ? ?Recent Labs  ?Lab 12/17/21 ?1336 12/18/21 ?0541  ?INR 1.3* 1.3*  ? ? ?  ? ?Imaging Studies: ?CT ABDOMEN PELVIS W CONTRAST ? ?Result Date: 12/20/2021 ?CLINICAL DATA:  History of bladder cancer, status post TURP. Nonlocalized abdominal pain after ERCP. EXAM: CT ABDOMEN AND PELVIS WITH CONTRAST TECHNIQUE: Multidetector CT imaging of the abdomen and pelvis was performed using the standard protocol following bolus administration of intravenous contrast. RADIATION DOSE REDUCTION: This exam was performed according to the departmental dose-optimization program which includes automated exposure control, adjustment of the mA and/or kV according to patient size and/or use of iterative reconstruction technique. CONTRAST:  117m OMNIPAQUE IOHEXOL 300 MG/ML  SOLN COMPARISON:  12/17/2021 FINDINGS: Lower chest: Bibasilar collapse/consolidation in the lower lobes, left greater than right with small left and tiny right pleural effusions. Hepatobiliary: Nodular liver parenchyma and contour is compatible with cirrhosis. Markedly large calcified stone is again identified in the gallbladder with presumed layering stone in the fundus. Gallbladder wall thickening is similar to prior. Common bile duct measures upper  normal at 6 mm in the head of the pancreas. The tiny calcified stone seen previously in the distal common bile duct are not evident today. Pancreas: No focal mass lesion. No dilatation of the main duct. No intraparenchymal cyst. No peripancreatic edema. Spleen: No splenomegaly. No focal mass lesion. Adrenals/Urinary Tract: No adrenal nodule or mass. Kidneys unremarkable. No evidence for hydroureter. The urinary bladder appears normal for the degree of distention. Stomach/Bowel: Stomach is unremarkable. No gastric wall thickening. No evidence of outlet obstruction. Duodenum is normally positioned as is the ligament of Treitz. No small bowel wall thickening. No small bowel dilatation. The terminal ileum is normal. The appendix is not well visualized, but there is no  edema or inflammation in the region of the cecum. No gross colonic mass. No colonic wall thickening. Vascular/Lymphatic: There is moderate atherosclerotic calcification of the abdominal aorta without aneurysm. 12 mm short axis gastrohepatic ligament lymph node on 24/2 is similar to prior. 9 mm short axis node adjacent to the left adrenal gland on 25/2 is unchanged. Stable 10 mm short axis left para-aortic node on 37/2. No pelvic sidewall lymphadenopathy. Reproductive: Bulky uterine fibroid evident, as before. Other scattered small fibroids noted. There is no adnexal mass. Other: Small to moderate volume ascites is progressive in the interval. Musculoskeletal: Diffuse body wall edema is progressive since the prior study. No worrisome lytic or sclerotic osseous abnormality. Degenerative disc disease noted mid and lower lumbar spine. IMPRESSION: 1. Mild gastrohepatic ligament lymphadenopathy and borderline enlarged retroperitoneal lymph nodes are similar to 12/17/2021 but new since PET-CT of 06/17/2021. Given the history of bladder cancer, close follow-up recommended to exclude metastatic disease. 2. Interval progression of small to moderate volume ascites  with diffuse body wall edema. 3. Bibasilar collapse/consolidation, left greater than right with small left and tiny right pleural effusions. 4. Cirrhotic liver morphology. 5. Cholelithiasis with similar appearan

## 2021-12-21 NOTE — Progress Notes (Signed)
?PROGRESS NOTE ? ? ? ?Kristina Ellis  NTZ:[id number] DOB: 1951/02/09 DOA: 12/17/2021 ?PCP: [facility] [place]  ? ? ?Brief Narrative:  ?Kristina Ellis is a 71 y.o. female with medical history significant for A-fib s/p TEE with cardioversion, history of bladder cancer status post transurethral resection of the tumor, s/p chemo and radiation therapy, coronary artery disease, liver cirrhosis, hypertension who presents to the ER for evaluation of abdominal pain for about 3 days.  Abdominal pain is diffuse and rated an 8 x 10 in intensity at its worst.  It is nonradiating and is associated with nausea, vomiting and diarrhea.  She complains of chills but denies having any fever. ?She complains of palpitations and shortness of breath but denies having any chest pain.  She denies having any headache, no leg swelling, no dizziness, no lightheadedness, no blurred vision or focal deficit. ?She was noted to be in A-fib with a rapid ventricular rate and received a dose of metoprolol 2.5 mg IV ? ?5/13 s/p ercp. On 5/12 by Dr. Allen Norris. ?5/15 would like to advance her diet pain is less ? ? ? ?Consultants:  ?Surgery, GI, IR ? ?Procedures:  ? ?Antimicrobials:  ?  ? ? ?Subjective: ?No diarrhea, shortness of breath or chest pain ? ?Objective: ?Vitals:  ? 12/20/21 2324 12/21/21 0354 12/21/21 0825 12/21/21 1136  ?BP: 136/85 (!) 152/94 (!) 151/80 (!) 147/75  ?Pulse: 85 75 68 (!) 106  ?Resp: 16  17   ?Temp: 98 ?F (36.7 ?C) 97.7 ?F (36.5 ?C) 97.8 ?F (36.6 ?C)   ?TempSrc: Oral Oral Oral   ?SpO2: 99% 100% 98% 97%  ?Weight:      ?Height:      ? ? ?Intake/Output Summary (Last 24 hours) at 12/21/2021 1458 ?Last data filed at 12/21/2021 1301 ?Gross per 24 hour  ?Intake 942.67 ml  ?Output 1250 ml  ?Net -307.33 ml  ? ?Filed Weights  ? 12/17/21 0842 12/17/21 2030  ?Weight: 59.4 kg 55.9 kg  ? ? ?Examination: ?Calm, NAD ?Cta no w/r ?Reg s1/s2 no gallop ?Soft benign +bs ?No edema ?Aaoxox3  ?Mood and affect appropriate in current setting   ? ? ? ?Data Reviewed: I have personally reviewed following labs and imaging studies ? ?CBC: ?Recent Labs  ?Lab 12/17/21 ?6759 12/18/21 ?1638 12/19/21 ?0418  ?WBC 9.0 11.8* 8.0  ?HGB 14.3 11.2* 10.8*  ?HCT 42.9 32.7* 31.9*  ?MCV 106.5* 105.8* 105.3*  ?PLT 176 135* 148*  ? ?Basic Metabolic Panel: ?Recent Labs  ?Lab 12/17/21 ?1038 12/17/21 ?1252 12/18/21 ?4665 12/19/21 ?0418  ?NA 136  --  138 136  ?K 3.0*  --  3.1* 3.4*  ?CL 102  --  105 103  ?CO2 22  --  24 26  ?GLUCOSE 102*  --  82 88  ?BUN 17  --  16 19  ?CREATININE 1.10*  --  0.72 0.66  ?CALCIUM 8.1*  --  8.0* 7.9*  ?MG  --  1.5*  --   --   ? ?GFR: ?Estimated Creatinine Clearance: 49.9 mL/min (by C-G formula based on SCr of 0.66 mg/dL). ?Liver Function Tests: ?Recent Labs  ?Lab 12/17/21 ?1038 12/18/21 ?9935 12/19/21 ?0418  ?AST 46* 28 68*  ?ALT '28 16 23  '$ ?ALKPHOS 49 38 59  ?BILITOT 3.2* 2.0* 3.8*  ?PROT 8.1 6.4* 6.0*  ?ALBUMIN 2.9* 2.2* 2.0*  ? ?Recent Labs  ?Lab 12/17/21 ?1038 12/19/21 ?1540  ?LIPASE 28 299*  ? ?No results for input(s): AMMONIA in the last 168 hours. ?  Coagulation Profile: ?Recent Labs  ?Lab 12/17/21 ?1336 12/18/21 ?0541  ?INR 1.3* 1.3*  ? ?Cardiac Enzymes: ?No results for input(s): CKTOTAL, CKMB, CKMBINDEX, TROPONINI in the last 168 hours. ?BNP (last 3 results) ?No results for input(s): PROBNP in the last 8760 hours. ?HbA1C: ?No results for input(s): HGBA1C in the last 72 hours. ?CBG: ?Recent Labs  ?Lab 12/18/21 ?7672 12/19/21 ?1529  ?GLUCAP 82 134*  ? ?Lipid Profile: ?No results for input(s): CHOL, HDL, LDLCALC, TRIG, CHOLHDL, LDLDIRECT in the last 72 hours. ?Thyroid Function Tests: ?No results for input(s): TSH, T4TOTAL, FREET4, T3FREE, THYROIDAB in the last 72 hours. ?Anemia Panel: ?No results for input(s): VITAMINB12, FOLATE, FERRITIN, TIBC, IRON, RETICCTPCT in the last 72 hours. ?Sepsis Labs: ?Recent Labs  ?Lab 12/17/21 ?0947 12/17/21 ?1252 12/17/21 ?2057 12/18/21 ?0034 12/18/21 ?0541  ?PROCALCITON  --   --   --   --  1.28  ?LATICACIDVEN  3.7* 1.6 1.8 1.3  --   ? ? ?Recent Results (from the past 240 hour(s))  ?Blood culture (routine x 2)     Status: None (Preliminary result)  ? Collection Time: 12/17/21  9:13 AM  ? Specimen: BLOOD  ?Result Value Ref Range Status  ? Specimen Description BLOOD BLOOD RIGHT HAND  Final  ? Special Requests   Final  ?  BOTTLES DRAWN AEROBIC AND ANAEROBIC Blood Culture results may not be optimal due to an inadequate volume of blood received in culture bottles  ? Culture   Final  ?  NO GROWTH 4 DAYS ?Performed at [facility], [address] [place], [place] [address] ?  ? Report Status PENDING  Incomplete  ?Blood culture (routine x 2)     Status: None (Preliminary result)  ? Collection Time: 12/17/21  9:34 AM  ? Specimen: BLOOD  ?Result Value Ref Range Status  ? Specimen Description BLOOD BLOOD RIGHT FOREARM  Final  ? Special Requests   Final  ?  BOTTLES DRAWN AEROBIC AND ANAEROBIC Blood Culture results may not be optimal due to an inadequate volume of blood received in culture bottles  ? Culture   Final  ?  NO GROWTH 4 DAYS ?Performed at [facility], [address] [place], [place] [address] ?  ? Report Status PENDING  Incomplete  ?Urine Culture     Status: Abnormal  ? Collection Time: 12/17/21  9:41 AM  ? Specimen: Urine, Clean Catch  ?Result Value Ref Range Status  ? Specimen Description   Final  ?  URINE, CLEAN CATCH ?Performed at [facility], [address], [place], [place] [address] ?  ? Special Requests   Final  ?  NONE ?Performed at [facility], [address] [place],  [address] ?  ? Culture MULTIPLE SPECIES PRESENT, SUGGEST RECOLLECTION (A)  Final  ? Report Status 12/18/2021 FINAL  Final  ?  ? ? ? ? ? ?Radiology Studies: ?CT ABDOMEN PELVIS W CONTRAST ? ?Result Date: 12/20/2021 ?CLINICAL DATA:  History of bladder cancer, status post TURP. Nonlocalized abdominal pain after ERCP. EXAM: CT ABDOMEN AND PELVIS WITH CONTRAST TECHNIQUE: Multidetector CT imaging of the  abdomen and pelvis was performed using the standard protocol following bolus administration of intravenous contrast. RADIATION DOSE REDUCTION: This exam was performed according to the departmental dose-optimization program which includes automated exposure control, adjustment of the mA and/or kV according to patient size and/or use of iterative reconstruction technique. CONTRAST:  115m OMNIPAQUE IOHEXOL 300 MG/ML  SOLN COMPARISON:  12/17/2021 FINDINGS: Lower chest: Bibasilar  collapse/consolidation in the lower lobes, left greater than right with small left and tiny right pleural effusions. Hepatobiliary: Nodular liver parenchyma and contour is compatible with cirrhosis. Markedly large calcified stone is again identified in the gallbladder with presumed layering stone in the fundus. Gallbladder wall thickening is similar to prior. Common bile duct measures upper normal at 6 mm in the head of the pancreas. The tiny calcified stone seen previously in the distal common bile duct are not evident today. Pancreas: No focal mass lesion. No dilatation of the main duct. No intraparenchymal cyst. No peripancreatic edema. Spleen: No splenomegaly. No focal mass lesion. Adrenals/Urinary Tract: No adrenal nodule or mass. Kidneys unremarkable. No evidence for hydroureter. The urinary bladder appears normal for the degree of distention. Stomach/Bowel: Stomach is unremarkable. No gastric wall thickening. No evidence of outlet obstruction. Duodenum is normally positioned as is the ligament of Treitz. No small bowel wall thickening. No small bowel dilatation. The terminal ileum is normal. The appendix is not well visualized, but there is no edema or inflammation in the region of the cecum. No gross colonic mass. No colonic wall thickening. Vascular/Lymphatic: There is moderate atherosclerotic calcification of the abdominal aorta without aneurysm. 12 mm short axis gastrohepatic ligament lymph node on 24/2 is similar to prior. 9 mm  short axis node adjacent to the left adrenal gland on 25/2 is unchanged. Stable 10 mm short axis left para-aortic node on 37/2. No pelvic sidewall lymphadenopathy. Reproductive: Bulky uterine fibroid evident,

## 2021-12-22 ENCOUNTER — Ambulatory Visit: Admission: RE | Admit: 2021-12-22 | Payer: Medicare Other | Source: Ambulatory Visit

## 2021-12-22 ENCOUNTER — Inpatient Hospital Stay: Payer: Medicare Other | Attending: Oncology

## 2021-12-22 MED ORDER — METOPROLOL SUCCINATE ER 25 MG PO TB24: 25.0000 mg | ORAL_TABLET | Freq: Two times a day (BID) | ORAL | 0 refills | Status: DC

## 2021-12-22 MED ORDER — CIPROFLOXACIN HCL 500 MG PO TABS
500.0000 mg | ORAL_TABLET | Freq: Two times a day (BID) | ORAL | 0 refills | Status: AC
Start: 2021-12-22 — End: 2021-12-29

## 2021-12-22 MED ORDER — ELIQUIS 5 MG PO TABS: 5.0000 mg | ORAL_TABLET | Freq: Two times a day (BID) | ORAL | 0 refills | Status: DC

## 2021-12-22 MED ORDER — CIPROFLOXACIN HCL 500 MG PO TABS
500.0000 mg | ORAL_TABLET | Freq: Two times a day (BID) | ORAL | Status: DC
  Administered 2021-12-22: 500 mg via ORAL
  Filled 2021-12-22 (×2): qty 1

## 2021-12-22 MED ORDER — DILTIAZEM HCL ER COATED BEADS 120 MG PO CP24: 120.0000 mg | ORAL_CAPSULE | Freq: Every day | ORAL | 0 refills | Status: DC

## 2021-12-22 MED ORDER — METRONIDAZOLE 500 MG PO TABS
500.0000 mg | ORAL_TABLET | Freq: Three times a day (TID) | ORAL | Status: DC
  Filled 2021-12-22: qty 1

## 2021-12-22 MED ORDER — METRONIDAZOLE 500 MG PO TABS: 500.0000 mg | ORAL_TABLET | Freq: Three times a day (TID) | ORAL | 0 refills | Status: AC

## 2021-12-22 NOTE — Care Management Important Message (Signed)
Important Message ? ?Patient Details  ?Name: Kristina Ellis ?MRN: [id number] ?Date of Birth: 1951-06-04 ? ? ?Medicare Important Message Given:  Yes ? ?Reviewed with patient via room phone. Copy of Medicare IM mailed to home address on file.  ? ? ?[name] ?12/22/2021, 12:50 PM ?

## 2021-12-22 NOTE — Discharge Summary (Signed)
Kristina Ellis:[id number] DOB: 1951-01-19 DOA: 12/17/2021 ? ?PCP: [facility] ? ?Admit date: 12/17/2021 ?Discharge date: 12/22/2021 ? ?Admitted From: home ?Disposition:  home (refused SNF) ? ?Recommendations for Outpatient Follow-up:  ?Follow up with PCP in 1 week ?Please obtain BMP/CBC in one week ?Please follow up with GI  ?4. Reports if needs to she will call  f/u with surgery. ? ? ? ? ?Discharge Condition:Stable ?CODE STATUS:full ?Diet recommendation: low fat diet ? ? ?Brief/Interim Summary: ?Per HPI: Kristina Ellis is a 71 y.o. female with medical history significant for A-fib s/p TEE with cardioversion, history of bladder cancer status post transurethral resection of the tumor, s/p chemo and radiation therapy, coronary artery disease, liver cirrhosis, hypertension who presents to the ER for evaluation of abdominal pain for about 3 days.She was noted to be in A-fib with a rapid ventricular rate .Dilated gallbladder with marked gallbladder wall thickening and ?large intraluminal stone measuring up to 5.3 cm. Correlate with ?physical exam and laboratory findings for signs of cholecystitis  Surgery and Gi consulted.  Patient was started on Zosyn.Underwent S/p ERCP with biliary sphincterotomy and extraction by Dr. Allen Norris.  ?Patient declined percutaneous or gallbladder surgery. Post ERCP she was complaining of pain, repeat CT was done  Full result below. GI recommended not to recheck lipase as has not much utility to determine pancreatitis. She was treated with supportive care and stable for discharge today on po antibiotics. ?  ? ? ? ?Atrial fibrillation with RVR ([place]) ?Patient with a known history of A-fib who presented in a rapid ventricular rate. ?Started initially on Cardizem drip and then transition to p.o. ?Beta-blockers and Eliquis ?  ?  ?  ?UTI (urinary tract infection) ?Urine culture with mixed species ?  ?  ?Acute cholecystitis ?S/p ERCP with biliary sphincterotomy and  extraction ?Patient declined percutaneous or gallbladder surgery ?Was started on Zosyn transition to p.o. antibiotics to complete dose ?GI signed off ?She was going to think about whether she wasn't to f/u with surgery as outpatient ? ?  ?  ?Sepsis ([place]) ?2/2 to above ?Continue with antibiotic course ?  ?  ?  ?Hypokalemia ?Replaced with KCl ?  ?Bladder cancer ([place]) ?Patient with a history of stage II bladder cancer status post TURBT. ?Status post chemo and radiation therapy ?CT a/p : Mild gastrohepatic ligament lymphadenopathy and borderline ?enlarged retroperitoneal lymph nodes are similar to 12/17/2021 but ?new since PET-CT of 06/17/2021. Given the history of bladder cancer, ?close follow-up recommended to exclude metastatic disease ?Follow-up with oncology as an outpatient-Dr. Grayland Ormond . (Was contacted via chat.) ?  ?  ?  ?  ? ? ?Discharge Diagnoses:  ?Principal Problem: ?  Atrial fibrillation with RVR ([place]) ?Active Problems: ?  Sepsis ([place]) ?  Acute cholecystitis ?  UTI (urinary tract infection) ?  Hypokalemia ?  Bladder cancer ([place]) ?  Calculus of common duct ?  Duodenal stenosis ? ? ? ?Discharge Instructions ? ?Discharge Instructions   ? ? Call MD for:  temperature >100.4   Complete by: As directed ?  ? Diet - low sodium heart healthy   Complete by: As directed ?  ? Discharge instructions   Complete by: As directed ?  ? Eat low fat diet, take your antibiotics  ? Increase activity slowly   Complete by: As directed ?  ? ?  ? ?Allergies as of 12/22/2021   ? ?   Reactions  ? Atenolol Other (See Comments)  ? bradycardia ?bradycardia  ?  Penicillins Rash  ? ?  ? ?  ?Medication List  ?  ? ?STOP taking these medications   ? ?amLODipine 10 MG tablet ?Commonly known as: NORVASC ?  ?diclofenac 50 MG EC tablet ?Commonly known as: VOLTAREN ?  ?FeroSul 325 (65 FE) MG tablet ?Generic drug: ferrous sulfate ?  ?potassium chloride SA 20 MEQ tablet ?Commonly known as: KLOR-CON M ?  ? ?  ? ?TAKE these medications   ? ?ciprofloxacin  500 MG tablet ?Commonly known as: CIPRO ?Take 1 tablet (500 mg total) by mouth 2 (two) times daily for 7 days. ?  ?diltiazem 120 MG 24 hr capsule ?Commonly known as: Cardizem CD ?Take 1 capsule (120 mg total) by mouth daily. ?  ?Eliquis 5 MG Tabs tablet ?Generic drug: apixaban ?Take 1 tablet (5 mg total) by mouth 2 (two) times daily. ?  ?magnesium oxide 400 MG tablet ?Commonly known as: MAG-OX ?Take 1 tablet by mouth 2 (two) times daily. ?  ?metoprolol succinate 25 MG 24 hr tablet ?Commonly known as: TOPROL-XL ?Take 1 tablet (25 mg total) by mouth 2 (two) times daily. Take with or immediately following a meal. ?What changed: when to take this ?  ?metroNIDAZOLE 500 MG tablet ?Commonly known as: FLAGYL ?Take 1 tablet (500 mg total) by mouth every 8 (eight) hours for 7 days. ?  ?oxyCODONE-acetaminophen 5-325 MG tablet ?Commonly known as: PERCOCET/ROXICET ?Take 1 tablet by mouth every 6 (six) hours as needed for moderate pain or severe pain. ?  ?trolamine salicylate 10 % cream ?Commonly known as: ASPERCREME ?Apply 1 application topically as needed for muscle pain. ?  ? ?  ? ? Follow-up Information   ? ? [name], MD Follow up in 2 week(s).   ?Specialty: Gastroenterology ?Why: called, left voicemail to call patient with follow up, call office if you have not heard from office ?Contact information: [address] ?[place] [address] ?[phone] ? ? ?  ?  ? ? [facility] Follow up on 12/28/2021.   ?Specialty: General Practice ?Why: 1040 AM ?Contact information: ?[address] ?[place] [address] ?[phone] ? ? ?  ?  ? ? Lujean Amel D, MD Follow up on 01/12/2022.   ?Specialties: Cardiology, Internal Medicine ?Why: 230pm.  ?call office to see if there are any cancellations ?Contact information: ?[address] ?[place] [address] ?[phone] ? ? ?  ?  ? ? Lloyd Huger, MD Follow up in 1 week(s).   ?Specialty: Oncology ?Contact  information: ?[address] ?[place] [address] ?[phone] ? ? ?  ?  ? ?  ?  ? ?  ? ?Allergies  ?Allergen Reactions  ? Atenolol Other (See Comments)  ?  bradycardia ?bradycardia ?  ? Penicillins Rash  ? ? ?Consultations: ?Surgery and GI ? ? ?Procedures/Studies: ?CT ABDOMEN PELVIS W CONTRAST ? ?Result Date: 12/20/2021 ?CLINICAL DATA:  History of bladder cancer, status post TURP. Nonlocalized abdominal pain after ERCP. EXAM: CT ABDOMEN AND PELVIS WITH CONTRAST TECHNIQUE: Multidetector CT imaging of the abdomen and pelvis was performed using the standard protocol following bolus administration of intravenous contrast. RADIATION DOSE REDUCTION: This exam was performed according to the departmental dose-optimization program which includes automated exposure control, adjustment of the mA and/or kV according to patient size and/or use of iterative reconstruction technique. CONTRAST:  167m OMNIPAQUE IOHEXOL 300 MG/ML  SOLN COMPARISON:  12/17/2021 FINDINGS: Lower chest: Bibasilar collapse/consolidation in the lower lobes, left greater than right  with small left and tiny right pleural effusions. Hepatobiliary: Nodular liver parenchyma and contour is compatible with cirrhosis. Markedly large calcified stone is again identified in the gallbladder with presumed layering stone in the fundus. Gallbladder wall thickening is similar to prior. Common bile duct measures upper normal at 6 mm in the head of the pancreas. The tiny calcified stone seen previously in the distal common bile duct are not evident today. Pancreas: No focal mass lesion. No dilatation of the main duct. No intraparenchymal cyst. No peripancreatic edema. Spleen: No splenomegaly. No focal mass lesion. Adrenals/Urinary Tract: No adrenal nodule or mass. Kidneys unremarkable. No evidence for hydroureter. The urinary bladder appears normal for the degree of distention. Stomach/Bowel: Stomach is unremarkable. No gastric wall thickening. No evidence of outlet  obstruction. Duodenum is normally positioned as is the ligament of Treitz. No small bowel wall thickening. No small bowel dilatation. The terminal ileum is normal. The appendix is not well visualized, but there is no ede

## 2021-12-22 NOTE — TOC Initial Note (Signed)
Transition of Care (TOC) - Initial/Assessment Note  ? ? ?Patient Details  ?Name: Kristina Ellis ?MRN: [id number] ?Date of Birth: 16-Aug-1950 ? ?Transition of Care (TOC) CM/SW Contact:    ?Laurena Slimmer, RN ?Phone Number: ?12/22/2021, 10:47 AM ? ?Clinical Narrative:                 ?Spoke with patient by phone ?Patient lives with son ?Admitted for Afib ?Brother will transport her home at discharge ?PCP at [facility] ?DME: Kasandra Knudsen ?Per OT recommendation patient could benefit from SNF. Patient refused but agreeable to [facility]. Referral sent and accepted by [facility] at [facility] ? ?  ?  ? ? ?Patient Goals and CMS Choice ?  ?  ?  ? ?Expected Discharge Plan and Services ?  ?  ?  ?  ?  ?Expected Discharge Date: 12/22/21               ?  ?  ?  ?  ?  ?  ?  ?  ?  ?  ? ?Prior Living Arrangements/Services ?  ?  ?  ?       ?  ?  ?  ?  ? ?Activities of Daily Living ?Home Assistive Devices/Equipment: Cane (specify quad or straight) (1 prong cane) ?ADL Screening (condition at time of admission) ?Patient's cognitive ability adequate to safely complete daily activities?: Yes ?Is the patient deaf or have difficulty hearing?: No ?Does the patient have difficulty seeing, even when wearing glasses/contacts?: No ?Does the patient have difficulty concentrating, remembering, or making decisions?: No ?Patient able to express need for assistance with ADLs?: Yes ?Does the patient have difficulty dressing or bathing?: No ?Independently performs ADLs?: Yes (appropriate for developmental age) ?Does the patient have difficulty walking or climbing stairs?: No ?Weakness of Legs: Both ?Weakness of Arms/Hands: Both ? ?Permission Sought/Granted ?  ?  ?   ?   ?   ?   ? ?Emotional Assessment ?  ?  ?  ?  ?  ?  ? ?Admission diagnosis:  Gallstones [K80.20] ?Atrial fibrillation with RVR ([place]) [I48.91] ?Urinary tract infection, acute [N39.0] ?Sepsis, due to unspecified organism, unspecified whether acute organ dysfunction present ([place]) [A41.9] ?Patient  Active Problem List  ? Diagnosis Date Noted  ? Calculus of common duct   ? Duodenal stenosis   ? Sepsis ([place]) 12/17/2021  ? Acute cholecystitis 12/17/2021  ? UTI (urinary tract infection) 12/17/2021  ? Hypotension 08/09/2021  ? Lactic acidosis 08/09/2021  ? Elevated troponin level not due myocardial infarction 08/09/2021  ? Acute pain of left shoulder 08/09/2021  ? Hypokalemia 08/09/2021  ? Bladder cancer ([place]) 05/29/2021  ? Atrial fibrillation with RVR ([place]) 07/03/2020  ? Moderate pulmonary arterial systolic hypertension ([place]) 12/22/2019  ? Severe tricuspid regurgitation 12/22/2019  ? Coronary artery disease involving native coronary artery of native heart without angina pectoris 12/21/2019  ? NSTEMI (non-ST elevated myocardial infarction) ([place]) 07/26/2016  ? Open-angle glaucoma 09/11/2010  ? ?PCP:  [facility] [place] ?Pharmacy:   ?[facility] [place] [address] ?[place] [address] ?Phone: [phone] Fax: [phone] ? ?[facility] [place] [facility] [place] ?[address] [address] ?Phone: [phone] Fax: [phone] ? ? ? ? ?Social Determinants of Health (SDOH) Interventions ?  ? ?Readmission Risk Interventions ?   ? View : No data to display.  ?  ?  ?  ? ? ? ?

## 2021-12-24 ENCOUNTER — Inpatient Hospital Stay: Payer: Medicare Other | Admitting: Oncology

## 2021-12-24 ENCOUNTER — Encounter: Payer: Self-pay | Admitting: Oncology

## 2021-12-24 DIAGNOSIS — C678 Malignant neoplasm of overlapping sites of bladder: Secondary | ICD-10-CM

## 2021-12-24 LAB — CULTURE, BLOOD (ROUTINE X 2)
Culture: NO GROWTH
Culture: NO GROWTH

## 2022-01-06 ENCOUNTER — Other Ambulatory Visit: Payer: Medicare Other | Admitting: Urology

## 2022-01-07 ENCOUNTER — Encounter: Payer: Self-pay | Admitting: Urology

## 2022-01-07 ENCOUNTER — Other Ambulatory Visit: Payer: Self-pay

## 2022-01-07 ENCOUNTER — Ambulatory Visit (INDEPENDENT_AMBULATORY_CARE_PROVIDER_SITE_OTHER): Payer: Medicare Other | Admitting: Urology

## 2022-01-07 ENCOUNTER — Telehealth: Payer: Self-pay | Admitting: *Deleted

## 2022-01-07 VITALS — BP 139/101 | HR 131 | Ht 59.0 in | Wt 123.0 lb

## 2022-01-07 DIAGNOSIS — C679 Malignant neoplasm of bladder, unspecified: Secondary | ICD-10-CM

## 2022-01-07 DIAGNOSIS — R3989 Other symptoms and signs involving the genitourinary system: Secondary | ICD-10-CM

## 2022-01-07 MED ORDER — FLUCONAZOLE 150 MG PO TABS: 150.0000 mg | ORAL_TABLET | Freq: Once | ORAL | 0 refills | Status: AC

## 2022-01-07 MED ORDER — LIDOCAINE HCL URETHRAL/MUCOSAL 2 % EX GEL: 1.0000 "application " | Freq: Once | CUTANEOUS | Status: DC

## 2022-01-07 MED ORDER — SULFAMETHOXAZOLE-TRIMETHOPRIM 800-160 MG PO TABS: 1.0000 | ORAL_TABLET | Freq: Two times a day (BID) | ORAL | 0 refills | Status: AC

## 2022-01-07 NOTE — Progress Notes (Signed)
Cystoscopy canceled today-UTI symptoms and UA suspicious for infection, will send for culture  Bactrim DS twice daily x3 days, will reschedule surveillance cystoscopy  Nickolas Madrid, MD 01/07/2022

## 2022-01-07 NOTE — Telephone Encounter (Signed)
Call received from Dr Truddie Hidden office stating that patient was seen for hospital follow up and that her WBC has dropped from 3.9 to 2.0 Asking if anything needs to be done, does patient need to go back to hospital Please advise

## 2022-01-08 ENCOUNTER — Encounter: Payer: Self-pay | Admitting: Family Medicine

## 2022-01-08 LAB — URINALYSIS, COMPLETE
Bilirubin, UA: NEGATIVE
Glucose, UA: NEGATIVE
Nitrite, UA: POSITIVE — AB
RBC, UA: NEGATIVE
Specific Gravity, UA: 1.025 (ref 1.005–1.030)
Urobilinogen, Ur: 1 mg/dL (ref 0.2–1.0)
pH, UA: 6.5 (ref 5.0–7.5)

## 2022-01-08 LAB — MICROSCOPIC EXAMINATION

## 2022-01-08 NOTE — Telephone Encounter (Signed)
LVM letting Dr. Hilaria Ota office know Dr. Gary Fleet response and left my number for them to call back with any questions. Patient is also aware of her appointments that are scheduled.

## 2022-01-13 ENCOUNTER — Ambulatory Visit (INDEPENDENT_AMBULATORY_CARE_PROVIDER_SITE_OTHER): Payer: Medicare Other | Admitting: Urology

## 2022-01-13 ENCOUNTER — Encounter: Payer: Self-pay | Admitting: Urology

## 2022-01-13 VITALS — BP 114/82 | HR 79 | Ht 59.0 in | Wt 123.0 lb

## 2022-01-13 DIAGNOSIS — Z8551 Personal history of malignant neoplasm of bladder: Secondary | ICD-10-CM

## 2022-01-13 DIAGNOSIS — C679 Malignant neoplasm of bladder, unspecified: Secondary | ICD-10-CM

## 2022-01-13 NOTE — Progress Notes (Signed)
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  She deferred referral to consider cystectomy and urinary diversion, and opted for tri-modal therapy with chemotherapy(cisplatin x4) and radiation(XRT completed January 2023).  PET scan in November showed no evidence of metastatic disease.  She is overall doing well and denies significant urinary complaints today.  Recent CT abdomen and pelvis with contrast was performed on 12/20/2021 for abdominal pain that showed mild gastrohepatic ligament lymphadenopathy and borderline enlarged retroperitoneal lymph nodes that were new since her PET/CT from November 2022, and close follow-up was recommended.  After informed consent and discussion of the procedure and its risks, Kristina Ellis was positioned and prepped in the standard fashion. Cystoscopy was performed with a flexible cystoscope. The urethra, bladder neck and entire bladder was visualized in a standard fashion. There was a large scar at the right lateral bladder wall, but no definite evidence of abnormal tissue or recurrence.  The ureteral orifices were visualized in their normal location and orientation.  Cytology sent  Findings: No evidence of recurrence   Assessment and Plan: -Will call with urine cytology-> if negative or atypical continue close surveillance every 3-4 months for the first 2 years.  If cytology positive or suspicious will schedule bladder biopsy -Has follow-up with oncology next week, anticipate they will be ordering PET scan at some point in the next few months  Nickolas Madrid, MD 01/13/2022

## 2022-01-15 ENCOUNTER — Telehealth: Payer: Self-pay

## 2022-01-15 LAB — CYTOLOGY - NON PAP

## 2022-01-15 NOTE — Telephone Encounter (Signed)
-----   Message from Billey Co, MD sent at 01/15/2022  1:32 PM EDT ----- Good news, no cancer cells on urine cytology.  Please schedule follow-up in 3 to 4 months for clinic cystoscopy, and keep oncology follow-up as scheduled  Nickolas Madrid, MD 01/15/2022

## 2022-01-15 NOTE — Telephone Encounter (Signed)
Called pt no answer. Unable to leave message, no voicemail available. Appt scheduled. 1st attempt.

## 2022-01-18 NOTE — Telephone Encounter (Signed)
Called pt no answer. Unable to leave message. 2nd attempt.

## 2022-01-19 NOTE — Telephone Encounter (Signed)
Called pt no answer. 3rd attempt.  °

## 2022-01-19 NOTE — Progress Notes (Unsigned)
[name]  Telephone:(336) [phone] Fax:(336) [phone]  ID: Kristina Ellis OB: 03/05/1951  MR#: [id number]  AOZ#:[id number]  Patient Care Team: [facility] as PCP - General (General Practice) Lloyd Huger, MD as Consulting Physician (Hematology and Oncology)  CHIEF COMPLAINT: Stage II bladder cancer.  INTERVAL HISTORY: Patient returns to clinic today for routine 62-monthevaluation.  She has chronic weakness and fatigue as well as shortness of breath, but these are likely related to her ongoing A-fib she otherwise feels well.  She recently had a cystoscopy that revealed no evidence of recurrence.  She has no neurologic complaints.  She denies any recent fevers or illnesses.  She has a good appetite and denies weight loss.  She has no chest pain, cough, or hemoptysis.  She denies any nausea, vomiting, constipation, or diarrhea.  She has no urinary complaints.  Patient offers no further specific complaints today.  REVIEW OF SYSTEMS:   Review of Systems  Constitutional:  Positive for malaise/fatigue. Negative for fever and weight loss.  Respiratory:  Positive for shortness of breath. Negative for cough and hemoptysis.   Cardiovascular: Negative.  Negative for chest pain and leg swelling.  Gastrointestinal: Negative.  Negative for abdominal pain.  Genitourinary:  Negative for hematuria.  Musculoskeletal:  Negative for back pain, falls and joint pain.  Skin: Negative.  Negative for rash.  Neurological:  Positive for weakness. Negative for dizziness, seizures and headaches.  Psychiatric/Behavioral: Negative.  The patient is not nervous/anxious.     As per HPI. Otherwise, a complete review of systems is negative.  PAST MEDICAL HISTORY: Past Medical History:  Diagnosis Date   Aortic atherosclerosis ([place]    Arthritis    Atrial fibrillation ([place]    a.) s/p TEE with cardioversion on 02/04/2020. b.) on daily apixaban.   Bladder mass    a.)  CT 04/08/2021 --> 3.2 cm intraluminal bladder mass.   Carotid stenosis, bilateral    a.) Doppler 07/04/2020 --> mild; 1-49% stenosis BILATERALLY.   Cholelithiasis    a.) CT 11/06/2020 --> largest measured 4.5 cm   Chronic anticoagulation    a.) Apixaban   Common biliary duct calculus    a.) CT 04/08/2021 --> CBD dilated at 8 mm; 533mcalculus within distal CBD.   Coronary artery disease involving native coronary artery of native heart without angina pectoris 12/15/60/9528 Diastolic dysfunction    a.) TTE 07/04/2020 --> LVEF 60-65%; G1DD.   Hepatic cirrhosis (H[facility])    Hepatic steatosis    Hepatitis 1970   History of 2019 novel coronavirus disease (COVID-19) 12/20/2019   History of marijuana use    Hypertension    Hypertensive retinopathy    IDA (iron deficiency anemia)    Mitral stenosis    a.) TEE 04/10/2020 --> mild. b.) TTE 07/04/2020 --> EF 60-65%; mild (mean gradient 5 mmHg).   Moderate pulmonary arterial systolic hypertension ([place]5/15/2021   Nephrolithiasis    NSTEMI (non-ST elevated myocardial infarction) ([place]12/18/2017   Open-angle glaucoma 09/11/2010   PAH (pulmonary artery hypertension) ([place]   a.) TTE 12/21/2019 --> PASP 44 mmHg.   Uterine fibroid    a.) CT 04/08/2021 --> multiple with largest measuring 7 cm.   Valvular regurgitation    a.) TTE 09/01/2010 --> trivial to mild pan-valvular. b.) TTE 12/21/2019 --> mild MR and AR; moderate TR. c.) TTE 07/04/2020 --> mild TR; trivial MR and PR.   Vestibular neuronitis     PAST SURGICAL HISTORY: Past  Surgical History:  Procedure Laterality Date   BREAST CYST ASPIRATION Left    COLONOSCOPY  2012   ERCP N/A 12/18/2021   Procedure: ENDOSCOPIC RETROGRADE CHOLANGIOPANCREATOGRAPHY (ERCP);  Surgeon: Lucilla Lame, MD;  Location[facility] ENDOSCOPY;  Service: Endoscopy;  Laterality: N/A;   TEE WITH CARDIOVERSION N/A 02/04/2020   Procedure: TEE WITH CARDIOVERSION; Location: [facility]; Surgeon: Kandis Cocking, MD   TRANSURETHRAL RESECTION OF  BLADDER TUMOR N/A 05/15/2021   Procedure: TRANSURETHRAL RESECTION OF BLADDER TUMOR (TURBT);  Surgeon: Billey Co, MD;  Location: [facility] ORS;  Service: Urology;  Laterality: N/A;    FAMILY HISTORY: Family History  Problem Relation Age of Onset   Aneurysm Mother    Colon cancer Father    Lung cancer Brother    Breast cancer Neg Hx     ADVANCED DIRECTIVES (Y/N):  N  HEALTH MAINTENANCE: Social History   Tobacco Use   Smoking status: Former    Packs/day: 0.10    Years: 10.00    Total pack years: 1.00    Types: Cigarettes    Passive exposure: Past   Smokeless tobacco: Never   Tobacco comments:    occasional smoker  Vaping Use   Vaping Use: Never used  Substance Use Topics   Alcohol use: Yes    Alcohol/week: 5.0 standard drinks of alcohol    Types: 5 Cans of beer per week    Comment: weekly   Drug use: Not Currently    Types: 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     diltiazem (CARDIZEM CD) 120 MG 24 hr capsule Take 1 capsule (120 mg total) by mouth daily. 30 capsule 0   ELIQUIS 5 MG TABS tablet Take 1 tablet (5 mg total) by mouth 2 (two) times daily. 60 tablet 0   magnesium oxide (MAG-OX) 400 MG tablet Take 1 tablet by mouth 2 (two) times daily.     metoprolol succinate (TOPROL-XL) 25 MG 24 hr tablet Take 1 tablet (25 mg total) by mouth 2 (two) times daily. Take with or immediately following a meal. 60 tablet 0   trolamine salicylate (ASPERCREME) 10 % cream Apply 1 application topically as needed for muscle pain.     No current facility-administered medications for this visit.    OBJECTIVE: Vitals:   01/20/22 1023  BP: (!) 154/97  Pulse: 90  Resp: 18  Temp:  (!) 97.3 F (36.3 C)  SpO2: 94%     There is no height or weight on file to calculate BMI.    ECOG FS:1 - Symptomatic but completely ambulatory  General: Well-developed, well-nourished, no acute distress.  Sitting in a wheelchair. Eyes: Pink conjunctiva, anicteric sclera. HEENT: Normocephalic, moist mucous membranes. Lungs: No audible wheezing or coughing. Heart: Irregular. Abdomen: Soft, nontender, no obvious distention. Musculoskeletal: No edema, cyanosis, or clubbing. Neuro: Alert, answering all questions appropriately. Cranial nerves grossly intact. Skin: No rashes or petechiae noted. Psych: Normal affect.    LAB RESULTS:  Lab Results  Component Value Date   NA 137 01/20/2022   K 3.5 01/20/2022   CL 107 01/20/2022   CO2 22 01/20/2022   GLUCOSE 113 (H) 01/20/2022  BUN 10 01/20/2022   CREATININE 0.69 01/20/2022   CALCIUM 8.4 (L) 01/20/2022   PROT 6.0 (L) 12/19/2021   ALBUMIN 2.0 (L) 12/19/2021   AST 68 (H) 12/19/2021   ALT 23 12/19/2021   ALKPHOS 59 12/19/2021   BILITOT 3.8 (H) 12/19/2021   GFRNONAA >60 01/20/2022   GFRAA >60 03/07/2018    Lab Results  Component Value Date   WBC 1.9 (L) 01/20/2022   NEUTROABS 1.2 (L) 01/20/2022   HGB 11.8 (L) 01/20/2022   HCT 35.0 (L) 01/20/2022   MCV 105.7 (H) 01/20/2022   PLT 154 01/20/2022     STUDIES: No results found.  ASSESSMENT: Stage II bladder cancer.  PLAN:    Stage II bladder cancer: Pathology and imaging reviewed independently.  PET scan results from June 18, 2021 reviewed independently with no specific findings of active malignancy.  Since patient is not a surgical candidate she underwent TURBT.  Patient did not have port placement.   Patient received her fourth and last dose of cisplatin on August 04, 2021.  She subsequently completed XRT on August 19, 2021.  Patient had CT scan on Dec 20, 2021 that revealed mild abdominal lymphadenopathy.  Cystoscopy on January 07, 2022 did not reveal any evidence of  recurrence.  No intervention is needed at this time.  Repeat CT scan in mid August and follow-up 1 to 2 days later.    Hypomagnesia: Chronic and unchanged.  Patient continues to decline IV magnesium.  Continue oral magnesium supplementation.   Neutropenia: Chronic and unchanged.  Patient's total white blood cell count is 1.9 with a neutrophil count of 1.7.  Unclear etiology.  Can consider bone marrow biopsy in the future to further evaluate. Anemia: Chronic and unchanged.  Patient's hemoglobin is 11.8.  Thrombocytopenia: Resolved. A-fib: Patient states that she had a referral to cardiology, but did not keep this appointment.  Have recommended she call the office and reschedule for evaluation. Hypokalemia: Resolved.  Continue oral supplementation.   Patient expressed understanding and was in agreement with this plan. She also understands that She can call clinic at any time with any questions, concerns, or complaints.    Cancer Staging  Bladder cancer [facility]) Staging form: Urinary Bladder, AJCC 8th Edition - Clinical stage from 06/04/2021: Stage II (cT2, cN0, cM0) - Signed by Lloyd Huger, MD on 06/04/2021 WHO/ISUP grade (low/high): High Grade Histologic grading system: 2 grade system  Lloyd Huger, MD   01/20/2022 12:06 PM

## 2022-01-20 ENCOUNTER — Other Ambulatory Visit: Payer: Self-pay

## 2022-01-20 ENCOUNTER — Inpatient Hospital Stay: Payer: Medicare Other

## 2022-01-20 ENCOUNTER — Inpatient Hospital Stay: Payer: Medicare Other | Attending: Oncology | Admitting: Oncology

## 2022-01-20 VITALS — BP 154/97 | HR 90 | Temp 97.3°F | Resp 18

## 2022-01-20 DIAGNOSIS — Z8551 Personal history of malignant neoplasm of bladder: Secondary | ICD-10-CM | POA: Insufficient documentation

## 2022-01-20 DIAGNOSIS — Z800 Family history of malignant neoplasm of digestive organs: Secondary | ICD-10-CM | POA: Insufficient documentation

## 2022-01-20 DIAGNOSIS — Z801 Family history of malignant neoplasm of trachea, bronchus and lung: Secondary | ICD-10-CM | POA: Insufficient documentation

## 2022-01-20 DIAGNOSIS — I10 Essential (primary) hypertension: Secondary | ICD-10-CM | POA: Diagnosis not present

## 2022-01-20 DIAGNOSIS — C678 Malignant neoplasm of overlapping sites of bladder: Secondary | ICD-10-CM | POA: Diagnosis not present

## 2022-01-20 DIAGNOSIS — Z87891 Personal history of nicotine dependence: Secondary | ICD-10-CM | POA: Diagnosis not present

## 2022-01-20 DIAGNOSIS — I4891 Unspecified atrial fibrillation: Secondary | ICD-10-CM | POA: Diagnosis not present

## 2022-01-20 DIAGNOSIS — Z923 Personal history of irradiation: Secondary | ICD-10-CM | POA: Insufficient documentation

## 2022-01-20 DIAGNOSIS — D649 Anemia, unspecified: Secondary | ICD-10-CM | POA: Diagnosis present

## 2022-01-20 LAB — CBC WITH DIFFERENTIAL/PLATELET
Abs Immature Granulocytes: 0.01 10*3/uL (ref 0.00–0.07)
Basophils Absolute: 0 10*3/uL (ref 0.0–0.1)
Basophils Relative: 1 %
Eosinophils Absolute: 0 10*3/uL (ref 0.0–0.5)
Eosinophils Relative: 2 %
HCT: 35 % — ABNORMAL LOW (ref 36.0–46.0)
Hemoglobin: 11.8 g/dL — ABNORMAL LOW (ref 12.0–15.0)
Immature Granulocytes: 1 %
Lymphocytes Relative: 24 %
Lymphs Abs: 0.4 10*3/uL — ABNORMAL LOW (ref 0.7–4.0)
MCH: 35.6 pg — ABNORMAL HIGH (ref 26.0–34.0)
MCHC: 33.7 g/dL (ref 30.0–36.0)
MCV: 105.7 fL — ABNORMAL HIGH (ref 80.0–100.0)
Monocytes Absolute: 0.1 10*3/uL (ref 0.1–1.0)
Monocytes Relative: 8 %
Neutro Abs: 1.2 10*3/uL — ABNORMAL LOW (ref 1.7–7.7)
Neutrophils Relative %: 64 %
Platelets: 154 10*3/uL (ref 150–400)
RBC: 3.31 MIL/uL — ABNORMAL LOW (ref 3.87–5.11)
RDW: 13.5 % (ref 11.5–15.5)
WBC: 1.9 10*3/uL — ABNORMAL LOW (ref 4.0–10.5)
nRBC: 0 % (ref 0.0–0.2)

## 2022-01-20 LAB — BASIC METABOLIC PANEL
Anion gap: 8 (ref 5–15)
BUN: 10 mg/dL (ref 8–23)
CO2: 22 mmol/L (ref 22–32)
Calcium: 8.4 mg/dL — ABNORMAL LOW (ref 8.9–10.3)
Chloride: 107 mmol/L (ref 98–111)
Creatinine, Ser: 0.69 mg/dL (ref 0.44–1.00)
GFR, Estimated: >60 mL/min (ref >60)
Glucose, Bld: 113 mg/dL — ABNORMAL HIGH (ref 70–99)
Potassium: 3.5 mmol/L (ref 3.5–5.1)
Sodium: 137 mmol/L (ref 135–145)

## 2022-01-20 LAB — MAGNESIUM: Magnesium: 1.4 mg/dL — ABNORMAL LOW (ref 1.7–2.4)

## 2022-01-20 NOTE — Progress Notes (Signed)
Patient denies any concerns today.  

## 2022-02-11 ENCOUNTER — Ambulatory Visit
Admission: RE | Admit: 2022-02-11 | Discharge: 2022-02-11 | Disposition: A | Discharge status: Home / Self Care | Payer: Medicare Other | Source: Ambulatory Visit | Attending: Radiation Oncology | Admitting: Radiation Oncology

## 2022-02-11 VITALS — BP 111/97 | HR 95 | Temp 97.8°F | Resp 18 | Ht 59.0 in

## 2022-02-11 DIAGNOSIS — R351 Nocturia: Secondary | ICD-10-CM | POA: Diagnosis not present

## 2022-02-11 DIAGNOSIS — Z8551 Personal history of malignant neoplasm of bladder: Secondary | ICD-10-CM | POA: Diagnosis not present

## 2022-02-11 DIAGNOSIS — C678 Malignant neoplasm of overlapping sites of bladder: Secondary | ICD-10-CM

## 2022-02-11 DIAGNOSIS — Z923 Personal history of irradiation: Secondary | ICD-10-CM | POA: Diagnosis not present

## 2022-02-11 NOTE — Progress Notes (Signed)
Radiation Oncology Follow up Note  Name: Kristina Ellis   Date:   02/11/2022 MRN:  [id number] DOB: Jun 24, 1951    This 71 y.o. 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  Seen today in routine follow-up she is doing fairly well.  She has very little urinary symptoms has nocturia 1-2 times.  No frequency or urgency.  She was having some rectal incontinence a while back that has improved.  She had recent cystoscopy by Dr. SJeb Leveringshowing no evidence of disease.  Cytology was atypical although not diagnostic of recurrent disease.  CT scan also recently performed.  Showed mild gastrohepatic ligament lymphadenopathy and borderline enlarged retroperitoneal lymph nodes similar to May 2023 but new since PET scan of November 22.  COMPLICATIONS OF TREATMENT: none  FOLLOW UP COMPLIANCE: keeps appointments   PHYSICAL EXAM:  BP (!) 111/97   Pulse 95   Temp 97.8 F (36.6 C)   Resp 18   Ht '4\' 11"'$  (1.499 m)   BMI 24.84 kg/m  Thin appearing female wheelchair-bound in NAD.  Well-developed well-nourished patient in NAD. HEENT reveals PERLA, EOMI, discs not visualized.  Oral cavity is clear. No oral mucosal lesions are identified. Neck is clear without evidence of cervical or supraclavicular adenopathy. Lungs are clear to A&P. Cardiac examination is essentially unremarkable with regular rate and rhythm without murmur rub or thrill. Abdomen is benign with no organomegaly or masses noted. Motor sensory and DTR levels are equal and symmetric in the upper and lower extremities. Cranial nerves II through XII are grossly intact. Proprioception is intact. No peripheral  adenopathy or edema is identified. No motor or sensory levels are noted. Crude visual fields are within normal range.  RADIOLOGY RESULTS: CT scans reviewed compatible with above-stated findings  PLAN: Present time patient continues to do well with no evidence of disease.  She continues close follow-up care with urology.  She has a CT scan scheduled in about 6 months.  I have asked to see her back in 6 months for follow-up.  Patient is to call with any concerns.  I would like to take this opportunity to thank you for allowing me to participate in the care of your patient..Kristina Filbert MD

## 2022-03-20 NOTE — Progress Notes (Deleted)
[place]  Telephone:([phone]) [phone] Fax:(336) [phone]  ID: Kristina Ellis OB: [birth year]  MR#: [id number]  HTX#:[id number]  Patient Care Team: [facility], [facility] as PCP - General (General Practice) Lloyd Huger, MD as Consulting Physician (Hematology and Oncology)  CHIEF COMPLAINT: Stage II bladder cancer.  INTERVAL HISTORY: Patient returns to clinic today for routine 34-monthevaluation.  She has chronic weakness and fatigue as well as shortness of breath, but these are likely related to her ongoing A-fib she otherwise feels well.  She recently had a cystoscopy that revealed no evidence of recurrence.  She has no neurologic complaints.  She denies any recent fevers or illnesses.  She has a good appetite and denies weight loss.  She has no chest pain, cough, or hemoptysis.  She denies any nausea, vomiting, constipation, or diarrhea.  She has no urinary complaints.  Patient offers no further specific complaints today.  REVIEW OF SYSTEMS:   Review of Systems  Constitutional:  Positive for malaise/fatigue. Negative for fever and weight loss.  Respiratory:  Positive for shortness of breath. Negative for cough and hemoptysis.   Cardiovascular: Negative.  Negative for chest pain and leg swelling.  Gastrointestinal: Negative.  Negative for abdominal pain.  Genitourinary:  Negative for hematuria.  Musculoskeletal:  Negative for back pain, falls and joint pain.  Skin: Negative.  Negative for rash.  Neurological:  Positive for weakness. Negative for dizziness, seizures and headaches.  Psychiatric/Behavioral: Negative.  The patient is not nervous/anxious.     As per HPI. Otherwise, a complete review of systems is negative.  PAST MEDICAL HISTORY: Past Medical History:  Diagnosis Date   Aortic atherosclerosis ([facility]    Arthritis    Atrial fibrillation ([facility]    a.) s/p TEE with cardioversion on 02/04/2020. b.) on daily apixaban.   Bladder mass    a.)  CT 04/08/2021 --> 3.2 cm intraluminal bladder mass.   Carotid stenosis, bilateral    a.) Doppler 07/04/2020 --> mild; 1-49% stenosis BILATERALLY.   Cholelithiasis    a.) CT 11/06/2020 --> largest measured 4.5 cm   Chronic anticoagulation    a.) Apixaban   Common biliary duct calculus    a.) CT 04/08/2021 --> CBD dilated at 8 mm; 576mcalculus within distal CBD.   Coronary artery disease involving native coronary artery of native heart without angina pectoris 12/10/18/2334 Diastolic dysfunction    a.) TTE 07/04/2020 --> LVEF 60-65%; G1DD.   Hepatic cirrhosis (HCC)    Hepatic steatosis    Hepatitis 1970   History of 2019 novel coronavirus disease (COVID-19) 12/20/2019   History of marijuana use    Hypertension    Hypertensive retinopathy    IDA (iron deficiency anemia)    Mitral stenosis    a.) TEE 04/10/2020 --> mild. b.) TTE 07/04/2020 --> EF 60-65%; mild (mean gradient 5 mmHg).   Moderate pulmonary arterial systolic hypertension ([facility]5/15/2021   Nephrolithiasis    NSTEMI (non-ST elevated myocardial infarction) ([facility]/18/2017   Open-angle glaucoma 09/11/2010   PAH (pulmonary artery hypertension) ([facility]   a.) TTE 12/21/2019 --> PASP 44 mmHg.   Uterine fibroid    a.) CT 04/08/2021 --> multiple with largest measuring 7 cm.   Valvular regurgitation    a.) TTE 09/01/2010 --> trivial to mild pan-valvular. b.) TTE 12/21/2019 --> mild MR and AR; moderate TR. c.) TTE 07/04/2020 --> mild TR; trivial MR and PR.   Vestibular neuronitis     PAST SURGICAL HISTORY: Past  Surgical History:  Procedure Laterality Date   BREAST CYST ASPIRATION Left    COLONOSCOPY  2012   ERCP N/A 12/18/2021   Procedure: ENDOSCOPIC RETROGRADE CHOLANGIOPANCREATOGRAPHY (ERCP);  Surgeon: Lucilla Lame, MD;  Location: [facility] ENDOSCOPY;  Service: Endoscopy;  Laterality: N/A;   TEE WITH CARDIOVERSION N/A 02/04/2020   Procedure: TEE WITH CARDIOVERSION; Location: [facility]; Surgeon: Kandis Cocking, MD   TRANSURETHRAL RESECTION OF  BLADDER TUMOR N/A 05/15/2021   Procedure: TRANSURETHRAL RESECTION OF BLADDER TUMOR (TURBT);  Surgeon: Billey Co, MD;  Location: [facility] ORS;  Service: Urology;  Laterality: N/A;    FAMILY HISTORY: Family History  Problem Relation Age of Onset   Aneurysm Mother    Colon cancer Father    Lung cancer Brother    Breast cancer Neg Hx     ADVANCED DIRECTIVES (Y/N):  N  HEALTH MAINTENANCE: Social History   Tobacco Use   Smoking status: Former    Packs/day: 0.10    Years: 10.00    Total pack years: 1.00    Types: Cigarettes    Passive exposure: Past   Smokeless tobacco: Never   Tobacco comments:    occasional smoker  Vaping Use   Vaping Use: Never used  Substance Use Topics   Alcohol use: Yes    Alcohol/week: 5.0 standard drinks of alcohol    Types: 5 Cans of beer per week    Comment: weekly   Drug use: Not Currently    Types: 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     amLODipine (NORVASC) 10 MG tablet Take 10 mg by mouth daily. (Patient not taking: Reported on 02/11/2022)     diltiazem (CARDIZEM CD) 120 MG 24 hr capsule Take 1 capsule (120 mg total) by mouth daily. 30 capsule 0   ELIQUIS 5 MG TABS tablet Take 1 tablet (5 mg total) by mouth 2 (two) times daily. 60 tablet 0   magnesium oxide (MAG-OX) 400 MG tablet Take 1 tablet by mouth 2 (two) times daily.     metoprolol succinate (TOPROL-XL) 25 MG 24 hr tablet Take 1 tablet (25 mg total) by mouth 2 (two) times daily. Take with or immediately following a meal. 60 tablet 0   traMADol (ULTRAM) 50 MG tablet Take One tab PO Q6 hours PRN pain     trolamine salicylate (ASPERCREME) 10 % cream Apply 1 application topically as needed for  muscle pain.     No current facility-administered medications for this visit.    OBJECTIVE: There were no vitals filed for this visit.    There is no height or weight on file to calculate BMI.    ECOG FS:1 - Symptomatic but completely ambulatory  General: Well-developed, well-nourished, no acute distress.  Sitting in a wheelchair. Eyes: Pink conjunctiva, anicteric sclera. HEENT: Normocephalic, moist mucous membranes. Lungs: No audible wheezing or coughing. Heart: Irregular. Abdomen: Soft, nontender, no obvious distention. Musculoskeletal: No edema, cyanosis, or clubbing. Neuro: Alert, answering all questions appropriately. Cranial nerves grossly intact. Skin: No rashes or petechiae noted. Psych: Normal affect.    LAB RESULTS:  Lab Results  Component Value Date   NA 137 01/20/2022   K 3.5 01/20/2022  CL 107 01/20/2022   CO2 22 01/20/2022   GLUCOSE 113 (H) 01/20/2022   BUN 10 01/20/2022   CREATININE 0.69 01/20/2022   CALCIUM 8.4 (L) 01/20/2022   PROT 6.0 (L) 12/19/2021   ALBUMIN 2.0 (L) 12/19/2021   AST 68 (H) 12/19/2021   ALT 23 12/19/2021   ALKPHOS 59 12/19/2021   BILITOT 3.8 (H) 12/19/2021   GFRNONAA >60 01/20/2022   GFRAA >60 03/07/2018    Lab Results  Component Value Date   WBC 1.9 (L) 01/20/2022   NEUTROABS 1.2 (L) 01/20/2022   HGB 11.8 (L) 01/20/2022   HCT 35.0 (L) 01/20/2022   MCV 105.7 (H) 01/20/2022   PLT 154 01/20/2022     STUDIES: No results found.  ASSESSMENT: Stage II bladder cancer.  PLAN:    Stage II bladder cancer: Pathology and imaging reviewed independently.  PET scan results from June 18, 2021 reviewed independently with no specific findings of active malignancy.  Since patient is not a surgical candidate she underwent TURBT.  Patient did not have port placement.   Patient received her fourth and last dose of cisplatin on August 04, 2021.  She subsequently completed XRT on August 19, 2021.  Patient had CT scan on Dec 20, 2021  that revealed mild abdominal lymphadenopathy.  Cystoscopy on January 07, 2022 did not reveal any evidence of recurrence.  No intervention is needed at this time.  Repeat CT scan in mid August and follow-up 1 to 2 days later.    Hypomagnesia: Chronic and unchanged.  Patient continues to decline IV magnesium.  Continue oral magnesium supplementation.   Neutropenia: Chronic and unchanged.  Patient's total white blood cell count is 1.9 with a neutrophil count of 1.7.  Unclear etiology.  Can consider bone marrow biopsy in the future to further evaluate. Anemia: Chronic and unchanged.  Patient's hemoglobin is 11.8.  Thrombocytopenia: Resolved. A-fib: Patient states that she had a referral to cardiology, but did not keep this appointment.  Have recommended she call the office and reschedule for evaluation. Hypokalemia: Resolved.  Continue oral supplementation.   Patient expressed understanding and was in agreement with this plan. She also understands that She can call clinic at any time with any questions, concerns, or complaints.    Cancer Staging  Bladder cancer [facility]) Staging form: Urinary Bladder, AJCC 8th Edition - Clinical stage from 06/04/2021: Stage II (cT2, cN0, cM0) - Signed by Lloyd Huger, MD on 06/04/2021 WHO/ISUP grade (low/high): High Grade Histologic grading system: 2 grade system  Lloyd Huger, MD   03/20/2022 5:50 PM

## 2022-03-24 ENCOUNTER — Inpatient Hospital Stay: Payer: Medicare Other

## 2022-03-24 ENCOUNTER — Ambulatory Visit: Admission: RE | Admit: 2022-03-24 | Payer: Medicare Other | Source: Ambulatory Visit

## 2022-03-25 ENCOUNTER — Inpatient Hospital Stay: Payer: Medicare Other | Admitting: Oncology

## 2022-03-25 DIAGNOSIS — C678 Malignant neoplasm of overlapping sites of bladder: Secondary | ICD-10-CM

## 2022-04-06 ENCOUNTER — Inpatient Hospital Stay: Payer: Medicare Other | Attending: Oncology

## 2022-04-06 ENCOUNTER — Ambulatory Visit: Admission: RE | Admit: 2022-04-06 | Payer: Medicare Other | Source: Ambulatory Visit

## 2022-04-11 ENCOUNTER — Inpatient Hospital Stay
Admission: EM | Admit: 2022-04-11 | Discharge: 2022-04-14 | DRG: 445 | Disposition: A | Discharge status: Home / Self Care | Payer: Medicare Other | Attending: Internal Medicine | Admitting: Internal Medicine

## 2022-04-11 ENCOUNTER — Emergency Department: Payer: Medicare Other

## 2022-04-11 ENCOUNTER — Inpatient Hospital Stay: Payer: Medicare Other

## 2022-04-11 ENCOUNTER — Encounter: Payer: Self-pay | Admitting: Emergency Medicine

## 2022-04-11 ENCOUNTER — Other Ambulatory Visit: Payer: Self-pay

## 2022-04-11 DIAGNOSIS — R109 Unspecified abdominal pain: Secondary | ICD-10-CM | POA: Diagnosis not present

## 2022-04-11 DIAGNOSIS — R300 Dysuria: Secondary | ICD-10-CM | POA: Diagnosis present

## 2022-04-11 DIAGNOSIS — I2510 Atherosclerotic heart disease of native coronary artery without angina pectoris: Secondary | ICD-10-CM | POA: Diagnosis present

## 2022-04-11 DIAGNOSIS — I110 Hypertensive heart disease with heart failure: Secondary | ICD-10-CM | POA: Diagnosis present

## 2022-04-11 DIAGNOSIS — I4892 Unspecified atrial flutter: Secondary | ICD-10-CM | POA: Diagnosis present

## 2022-04-11 DIAGNOSIS — I5032 Chronic diastolic (congestive) heart failure: Secondary | ICD-10-CM | POA: Diagnosis present

## 2022-04-11 DIAGNOSIS — D509 Iron deficiency anemia, unspecified: Secondary | ICD-10-CM | POA: Diagnosis present

## 2022-04-11 DIAGNOSIS — K810 Acute cholecystitis: Secondary | ICD-10-CM | POA: Diagnosis not present

## 2022-04-11 DIAGNOSIS — R079 Chest pain, unspecified: Secondary | ICD-10-CM

## 2022-04-11 DIAGNOSIS — I4891 Unspecified atrial fibrillation: Secondary | ICD-10-CM | POA: Diagnosis not present

## 2022-04-11 DIAGNOSIS — R0902 Hypoxemia: Secondary | ICD-10-CM | POA: Diagnosis present

## 2022-04-11 DIAGNOSIS — Z800 Family history of malignant neoplasm of digestive organs: Secondary | ICD-10-CM

## 2022-04-11 DIAGNOSIS — C787 Secondary malignant neoplasm of liver and intrahepatic bile duct: Secondary | ICD-10-CM | POA: Diagnosis present

## 2022-04-11 DIAGNOSIS — C679 Malignant neoplasm of bladder, unspecified: Secondary | ICD-10-CM | POA: Diagnosis present

## 2022-04-11 DIAGNOSIS — I2721 Secondary pulmonary arterial hypertension: Secondary | ICD-10-CM | POA: Diagnosis present

## 2022-04-11 DIAGNOSIS — R197 Diarrhea, unspecified: Secondary | ICD-10-CM | POA: Diagnosis present

## 2022-04-11 DIAGNOSIS — K760 Fatty (change of) liver, not elsewhere classified: Secondary | ICD-10-CM | POA: Diagnosis present

## 2022-04-11 DIAGNOSIS — Z801 Family history of malignant neoplasm of trachea, bronchus and lung: Secondary | ICD-10-CM

## 2022-04-11 DIAGNOSIS — N3941 Urge incontinence: Secondary | ICD-10-CM | POA: Diagnosis present

## 2022-04-11 DIAGNOSIS — M1990 Unspecified osteoarthritis, unspecified site: Secondary | ICD-10-CM | POA: Diagnosis present

## 2022-04-11 DIAGNOSIS — Z79899 Other long term (current) drug therapy: Secondary | ICD-10-CM

## 2022-04-11 DIAGNOSIS — K8000 Calculus of gallbladder with acute cholecystitis without obstruction: Principal | ICD-10-CM | POA: Diagnosis present

## 2022-04-11 DIAGNOSIS — Z923 Personal history of irradiation: Secondary | ICD-10-CM

## 2022-04-11 DIAGNOSIS — R319 Hematuria, unspecified: Secondary | ICD-10-CM | POA: Diagnosis present

## 2022-04-11 DIAGNOSIS — Z888 Allergy status to other drugs, medicaments and biological substances status: Secondary | ICD-10-CM

## 2022-04-11 DIAGNOSIS — E785 Hyperlipidemia, unspecified: Secondary | ICD-10-CM | POA: Diagnosis present

## 2022-04-11 DIAGNOSIS — I739 Peripheral vascular disease, unspecified: Secondary | ICD-10-CM | POA: Diagnosis present

## 2022-04-11 DIAGNOSIS — E876 Hypokalemia: Secondary | ICD-10-CM | POA: Diagnosis present

## 2022-04-11 DIAGNOSIS — K819 Cholecystitis, unspecified: Secondary | ICD-10-CM | POA: Diagnosis present

## 2022-04-11 DIAGNOSIS — Z9221 Personal history of antineoplastic chemotherapy: Secondary | ICD-10-CM

## 2022-04-11 DIAGNOSIS — C779 Secondary and unspecified malignant neoplasm of lymph node, unspecified: Secondary | ICD-10-CM | POA: Diagnosis present

## 2022-04-11 DIAGNOSIS — H35039 Hypertensive retinopathy, unspecified eye: Secondary | ICD-10-CM | POA: Diagnosis present

## 2022-04-11 DIAGNOSIS — Z789 Other specified health status: Secondary | ICD-10-CM | POA: Diagnosis present

## 2022-04-11 DIAGNOSIS — D259 Leiomyoma of uterus, unspecified: Secondary | ICD-10-CM | POA: Diagnosis present

## 2022-04-11 DIAGNOSIS — I480 Paroxysmal atrial fibrillation: Secondary | ICD-10-CM | POA: Diagnosis present

## 2022-04-11 DIAGNOSIS — F1010 Alcohol abuse, uncomplicated: Secondary | ICD-10-CM | POA: Diagnosis present

## 2022-04-11 DIAGNOSIS — K7460 Unspecified cirrhosis of liver: Secondary | ICD-10-CM | POA: Diagnosis present

## 2022-04-11 DIAGNOSIS — I252 Old myocardial infarction: Secondary | ICD-10-CM

## 2022-04-11 DIAGNOSIS — R0789 Other chest pain: Secondary | ICD-10-CM | POA: Diagnosis present

## 2022-04-11 DIAGNOSIS — R599 Enlarged lymph nodes, unspecified: Secondary | ICD-10-CM | POA: Diagnosis present

## 2022-04-11 DIAGNOSIS — R188 Other ascites: Secondary | ICD-10-CM | POA: Diagnosis present

## 2022-04-11 DIAGNOSIS — Z8616 Personal history of COVID-19: Secondary | ICD-10-CM

## 2022-04-11 DIAGNOSIS — R002 Palpitations: Secondary | ICD-10-CM | POA: Diagnosis present

## 2022-04-11 DIAGNOSIS — Z7901 Long term (current) use of anticoagulants: Secondary | ICD-10-CM

## 2022-04-11 DIAGNOSIS — Z87442 Personal history of urinary calculi: Secondary | ICD-10-CM

## 2022-04-11 DIAGNOSIS — Z87891 Personal history of nicotine dependence: Secondary | ICD-10-CM

## 2022-04-11 DIAGNOSIS — Z880 Allergy status to penicillin: Secondary | ICD-10-CM

## 2022-04-11 DIAGNOSIS — R1011 Right upper quadrant pain: Secondary | ICD-10-CM | POA: Diagnosis not present

## 2022-04-11 LAB — HEPATIC FUNCTION PANEL
ALT: 14 U/L (ref 0–44)
AST: 32 U/L (ref 15–41)
Albumin: 3.1 g/dL — ABNORMAL LOW (ref 3.5–5.0)
Alkaline Phosphatase: 49 U/L (ref 38–126)
Bilirubin, Direct: 0.9 mg/dL — ABNORMAL HIGH (ref 0.0–0.2)
Indirect Bilirubin: 1.3 mg/dL — ABNORMAL HIGH (ref 0.3–0.9)
Total Bilirubin: 2.2 mg/dL — ABNORMAL HIGH (ref 0.3–1.2)
Total Protein: 8.9 g/dL — ABNORMAL HIGH (ref 6.5–8.1)

## 2022-04-11 LAB — CBC WITH DIFFERENTIAL/PLATELET
Abs Immature Granulocytes: 0.1 10*3/uL — ABNORMAL HIGH (ref 0.00–0.07)
Basophils Absolute: 0 10*3/uL (ref 0.0–0.1)
Basophils Relative: 0 %
Eosinophils Absolute: 0 10*3/uL (ref 0.0–0.5)
Eosinophils Relative: 0 %
HCT: 38.3 % (ref 36.0–46.0)
Hemoglobin: 12.8 g/dL (ref 12.0–15.0)
Immature Granulocytes: 1 %
Lymphocytes Relative: 8 %
Lymphs Abs: 0.9 10*3/uL (ref 0.7–4.0)
MCH: 34.8 pg — ABNORMAL HIGH (ref 26.0–34.0)
MCHC: 33.4 g/dL (ref 30.0–36.0)
MCV: 104.1 fL — ABNORMAL HIGH (ref 80.0–100.0)
Monocytes Absolute: 0.5 10*3/uL (ref 0.1–1.0)
Monocytes Relative: 5 %
Neutro Abs: 9.8 10*3/uL — ABNORMAL HIGH (ref 1.7–7.7)
Neutrophils Relative %: 86 %
Platelets: 180 10*3/uL (ref 150–400)
RBC: 3.68 MIL/uL — ABNORMAL LOW (ref 3.87–5.11)
RDW: 13.4 % (ref 11.5–15.5)
WBC: 11.3 10*3/uL — ABNORMAL HIGH (ref 4.0–10.5)
nRBC: 0 % (ref 0.0–0.2)

## 2022-04-11 LAB — URINALYSIS, ROUTINE W REFLEX MICROSCOPIC
Bilirubin Urine: NEGATIVE
Glucose, UA: NEGATIVE mg/dL
Hgb urine dipstick: NEGATIVE
Ketones, ur: NEGATIVE mg/dL
Leukocytes,Ua: NEGATIVE
Nitrite: NEGATIVE
Protein, ur: NEGATIVE mg/dL
Specific Gravity, Urine: >1.046 — ABNORMAL HIGH (ref 1.005–1.030)
pH: 7 (ref 5.0–8.0)

## 2022-04-11 LAB — BASIC METABOLIC PANEL
Anion gap: 14 (ref 5–15)
BUN: 7 mg/dL — ABNORMAL LOW (ref 8–23)
CO2: 23 mmol/L (ref 22–32)
Calcium: 8.5 mg/dL — ABNORMAL LOW (ref 8.9–10.3)
Chloride: 97 mmol/L — ABNORMAL LOW (ref 98–111)
Creatinine, Ser: 0.67 mg/dL (ref 0.44–1.00)
GFR, Estimated: >60 mL/min (ref >60)
Glucose, Bld: 96 mg/dL (ref 70–99)
Potassium: 2.6 mmol/L — CL (ref 3.5–5.1)
Sodium: 134 mmol/L — ABNORMAL LOW (ref 135–145)

## 2022-04-11 LAB — LACTIC ACID, PLASMA
Lactic Acid, Venous: 1.7 mmol/L (ref 0.5–1.9)
Lactic Acid, Venous: 1.3 mmol/L (ref 0.5–1.9)

## 2022-04-11 LAB — TROPONIN I (HIGH SENSITIVITY)
Troponin I (High Sensitivity): 17 ng/L (ref <18)
Troponin I (High Sensitivity): 13 ng/L (ref <18)

## 2022-04-11 LAB — BRAIN NATRIURETIC PEPTIDE: B Natriuretic Peptide: 394.9 pg/mL — ABNORMAL HIGH (ref 0.0–100.0)

## 2022-04-11 LAB — LIPASE, BLOOD: Lipase: 27 U/L (ref 11–51)

## 2022-04-11 LAB — MAGNESIUM: Magnesium: 1.2 mg/dL — ABNORMAL LOW (ref 1.7–2.4)

## 2022-04-11 MED ORDER — APIXABAN 5 MG PO TABS
5.0000 mg | ORAL_TABLET | Freq: Two times a day (BID) | ORAL | Status: DC
  Administered 2022-04-11 – 2022-04-14 (×6): 5 mg via ORAL
  Filled 2022-04-11 (×6): qty 1

## 2022-04-11 MED ORDER — ACETAMINOPHEN 325 MG PO TABS: 650.0000 mg | ORAL_TABLET | Freq: Four times a day (QID) | ORAL | Status: DC | PRN

## 2022-04-11 MED ORDER — POTASSIUM CHLORIDE CRYS ER 20 MEQ PO TBCR
40.0000 meq | EXTENDED_RELEASE_TABLET | Freq: Two times a day (BID) | ORAL | Status: AC
Start: 2022-04-11 — End: 2022-04-11
  Administered 2022-04-11 (×2): 40 meq via ORAL
  Filled 2022-04-11 (×2): qty 2

## 2022-04-11 MED ORDER — SODIUM CHLORIDE 0.9% FLUSH
3.0000 mL | Freq: Two times a day (BID) | INTRAVENOUS | Status: DC
  Administered 2022-04-11 – 2022-04-14 (×5): 3 mL via INTRAVENOUS

## 2022-04-11 MED ORDER — DILTIAZEM HCL 25 MG/5ML IV SOLN
INTRAVENOUS | Status: AC
  Filled 2022-04-11: qty 5

## 2022-04-11 MED ORDER — THIAMINE HCL 100 MG/ML IJ SOLN
100.0000 mg | Freq: Every day | INTRAMUSCULAR | Status: DC
  Administered 2022-04-12 – 2022-04-14 (×3): 100 mg via INTRAVENOUS
  Filled 2022-04-11 (×3): qty 2

## 2022-04-11 MED ORDER — ONDANSETRON HCL 4 MG/2ML IJ SOLN
4.0000 mg | Freq: Once | INTRAMUSCULAR | Status: AC
  Administered 2022-04-11: 4 mg via INTRAVENOUS
  Filled 2022-04-11: qty 2

## 2022-04-11 MED ORDER — MAGNESIUM SULFATE 2 GM/50ML IV SOLN
2.0000 g | Freq: Once | INTRAVENOUS | Status: AC
  Administered 2022-04-11: 2 g via INTRAVENOUS
  Filled 2022-04-11: qty 50

## 2022-04-11 MED ORDER — POTASSIUM CHLORIDE 10 MEQ/100ML IV SOLN
INTRAVENOUS | Status: AC
  Administered 2022-04-11: 10 meq via INTRAVENOUS
  Filled 2022-04-11: qty 100

## 2022-04-11 MED ORDER — POTASSIUM CHLORIDE 10 MEQ/100ML IV SOLN
10.0000 meq | Freq: Once | INTRAVENOUS | Status: AC
  Filled 2022-04-11: qty 100

## 2022-04-11 MED ORDER — MORPHINE SULFATE (PF) 4 MG/ML IV SOLN
4.0000 mg | Freq: Once | INTRAVENOUS | Status: AC
  Administered 2022-04-11: 4 mg via INTRAVENOUS
  Filled 2022-04-11: qty 1

## 2022-04-11 MED ORDER — FOLIC ACID 1 MG PO TABS
1.0000 mg | ORAL_TABLET | Freq: Every day | ORAL | Status: DC
  Administered 2022-04-12 – 2022-04-14 (×3): 1 mg via ORAL
  Filled 2022-04-11 (×4): qty 1

## 2022-04-11 MED ORDER — SODIUM CHLORIDE 0.9 % IV SOLN: INTRAVENOUS | Status: AC

## 2022-04-11 MED ORDER — AMIODARONE HCL 200 MG PO TABS
200.0000 mg | ORAL_TABLET | Freq: Every day | ORAL | Status: DC
  Administered 2022-04-11 – 2022-04-14 (×4): 200 mg via ORAL
  Filled 2022-04-11 (×4): qty 1

## 2022-04-11 MED ORDER — LORAZEPAM 1 MG PO TABS: 1.0000 mg | ORAL_TABLET | ORAL | Status: DC | PRN

## 2022-04-11 MED ORDER — SODIUM CHLORIDE 0.9 % IV SOLN: 1.0000 g | Freq: Once | INTRAVENOUS | Status: DC

## 2022-04-11 MED ORDER — SODIUM CHLORIDE 0.9 % IV SOLN: INTRAVENOUS | Status: DC

## 2022-04-11 MED ORDER — ATORVASTATIN CALCIUM 20 MG PO TABS
40.0000 mg | ORAL_TABLET | Freq: Every day | ORAL | Status: DC
  Administered 2022-04-11 – 2022-04-14 (×4): 40 mg via ORAL
  Filled 2022-04-11 (×4): qty 2

## 2022-04-11 MED ORDER — METRONIDAZOLE 500 MG/100ML IV SOLN
500.0000 mg | Freq: Two times a day (BID) | INTRAVENOUS | Status: DC
  Administered 2022-04-12 – 2022-04-14 (×5): 500 mg via INTRAVENOUS
  Filled 2022-04-11 (×5): qty 100

## 2022-04-11 MED ORDER — LORAZEPAM 2 MG/ML IJ SOLN: 1.0000 mg | INTRAMUSCULAR | Status: DC | PRN

## 2022-04-11 MED ORDER — SODIUM CHLORIDE 0.9 % IV SOLN
1.0000 g | INTRAVENOUS | Status: DC
  Administered 2022-04-11: 1 g via INTRAVENOUS
  Filled 2022-04-11: qty 10

## 2022-04-11 MED ORDER — METOPROLOL SUCCINATE ER 25 MG PO TB24
25.0000 mg | ORAL_TABLET | Freq: Two times a day (BID) | ORAL | Status: DC
  Administered 2022-04-11 – 2022-04-14 (×6): 25 mg via ORAL
  Filled 2022-04-11 (×6): qty 1

## 2022-04-11 MED ORDER — MORPHINE SULFATE (PF) 2 MG/ML IV SOLN
2.0000 mg | INTRAVENOUS | Status: DC | PRN
  Administered 2022-04-12 – 2022-04-13 (×4): 2 mg via INTRAVENOUS
  Filled 2022-04-11 (×4): qty 1

## 2022-04-11 MED ORDER — IOHEXOL 300 MG/ML  SOLN
100.0000 mL | Freq: Once | INTRAMUSCULAR | Status: AC | PRN
  Administered 2022-04-11: 100 mL via INTRAVENOUS

## 2022-04-11 MED ORDER — POTASSIUM CHLORIDE 10 MEQ/100ML IV SOLN
10.0000 meq | Freq: Once | INTRAVENOUS | Status: AC
  Administered 2022-04-11: 10 meq via INTRAVENOUS

## 2022-04-11 MED ORDER — METRONIDAZOLE 500 MG/100ML IV SOLN
500.0000 mg | Freq: Once | INTRAVENOUS | Status: AC
  Administered 2022-04-11: 500 mg via INTRAVENOUS
  Filled 2022-04-11: qty 100

## 2022-04-11 MED ORDER — SODIUM CHLORIDE 0.9 % IV BOLUS
1000.0000 mL | Freq: Once | INTRAVENOUS | Status: AC
Start: 2022-04-11 — End: 2022-04-11
  Administered 2022-04-11: 1000 mL via INTRAVENOUS

## 2022-04-11 MED ORDER — ACETAMINOPHEN 650 MG RE SUPP: 650.0000 mg | Freq: Four times a day (QID) | RECTAL | Status: DC | PRN

## 2022-04-11 MED ORDER — HYDROCODONE-ACETAMINOPHEN 5-325 MG PO TABS
1.0000 | ORAL_TABLET | ORAL | Status: DC | PRN
  Administered 2022-04-12 – 2022-04-14 (×3): 1 via ORAL
  Filled 2022-04-11 (×3): qty 1

## 2022-04-11 MED ORDER — ADULT MULTIVITAMIN W/MINERALS CH
1.0000 | ORAL_TABLET | Freq: Every day | ORAL | Status: DC
  Administered 2022-04-12 – 2022-04-14 (×3): 1 via ORAL
  Filled 2022-04-11 (×3): qty 1

## 2022-04-11 MED ORDER — DILTIAZEM HCL-DEXTROSE 125-5 MG/125ML-% IV SOLN (PREMIX)
INTRAVENOUS | Status: AC
  Administered 2022-04-11: 5 mg/h via INTRAVENOUS
  Filled 2022-04-11: qty 125

## 2022-04-11 MED ORDER — DILTIAZEM HCL-DEXTROSE 125-5 MG/125ML-% IV SOLN (PREMIX)
5.0000 mg/h | INTRAVENOUS | Status: DC
  Administered 2022-04-11: 10 mg/h via INTRAVENOUS
  Filled 2022-04-11: qty 125

## 2022-04-11 NOTE — Consult Note (Signed)
Subjective:   CC: RUQ pain  HPI:  Kristina Ellis is a 71 y.o. female who is consulted by [facility] for evaluation of above cc.  Symptoms were first noted a few days ago. Pain is RUQ, sharp, similar to previous episodes few months ago.  Associated with nothing, exacerbated by nothing specific     Past Medical History:  has a past medical history of Aortic atherosclerosis ([place]), Arthritis, Atrial fibrillation ([place]), Bladder mass, Carotid stenosis, bilateral, Cholelithiasis, Chronic anticoagulation, Common biliary duct calculus, Coronary artery disease involving native coronary artery of native heart without angina pectoris (6/60/6301), Diastolic dysfunction, Hepatic cirrhosis ([place]), Hepatic steatosis, Hepatitis (1970), History of 2019 novel coronavirus disease (COVID-19) (12/20/2019), History of marijuana use, Hypertension, Hypertensive retinopathy, IDA (iron deficiency anemia), Mitral stenosis, 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  Past Surgical History:  has a past surgical history that includes Colonoscopy (2012); Breast cyst aspiration (Left); TEE with cardioversion (N/A, 02/04/2020); Transurethral resection of bladder tumor (N/A, 05/15/2021); and ERCP (N/A, 12/18/2021).  Family History: family history includes Aneurysm in her mother; Colon cancer in her father; Lung cancer in her brother.  Social History:  reports that she has quit smoking. Her smoking use included cigarettes. She has a 1.00 pack-year smoking history. She has been exposed to tobacco smoke. She has never used smokeless tobacco. She reports current alcohol use of about 5.0 standard drinks of alcohol per week. She reports that she does not currently use drugs after having used the  following drugs: Marijuana.  Current Medications:  Prior to Admission medications   Medication Sig Start Date End Date Taking? Authorizing Provider  atorvastatin (LIPITOR) 40 MG tablet Take 40 mg by mouth at bedtime. 03/16/22  Yes [provider]  ELIQUIS 5 MG TABS tablet Take 1 tablet (5 mg total) by mouth 2 (two) times daily. 12/22/21 04/11/22 Yes Nolberto Hanlon, MD  magnesium oxide (MAG-OX) 400 MG tablet Take 1 tablet by mouth 2 (two) times daily. 03/24/20  Yes [provider]  metoprolol succinate (TOPROL-XL) 25 MG 24 hr tablet Take 1 tablet (25 mg total) by mouth 2 (two) times daily. Take with or immediately following a meal. 12/22/21 04/11/22 Yes Amery, Gwynneth Albright, MD  amiodarone (PACERONE) 200 MG tablet Take by mouth. 02/02/22 02/02/23  [provider]  amLODipine (NORVASC) 10 MG tablet Take 10 mg by mouth daily. Patient not taking: Reported on 02/11/2022 01/06/22   [provider]  diltiazem (CARDIZEM CD) 120 MG 24 hr capsule Take 1 capsule (120 mg total) by mouth daily. Patient not taking: Reported on 04/11/2022 12/22/21 12/22/22  Nolberto Hanlon, MD  traMADol Veatrice Bourbon) 50 MG tablet Take One tab PO Q6 hours PRN pain Patient not taking: Reported on 04/11/2022 09/22/21   [provider]  trolamine salicylate (ASPERCREME) 10 % cream Apply 1 application topically as needed for muscle pain.    [provider]    Allergies:  Allergies as of 04/11/2022 - Review Complete 04/11/2022  Allergen Reaction Noted   Atenolol Other (See Comments) 09/29/2009   Penicillins Rash 02/04/2020    ROS:  General: Denies weight loss, weight gain, fatigue, fevers, chills, and night sweats. Eyes: Denies blurry vision, double vision, eye pain, itchy eyes, and tearing. Ears: Denies hearing loss, earache, and ringing in ears. Nose: Denies sinus pain, congestion, infections, runny nose, and nosebleeds. Mouth/throat: Denies hoarseness, sore throat, bleeding  gums, and difficulty  swallowing. Heart: Denies chest pain, palpitations, racing heart, irregular heartbeat, leg pain or swelling, and decreased activity tolerance. Respiratory: Denies breathing difficulty, shortness of breath, wheezing, cough, and sputum. GI: Denies change in appetite, heartburn, nausea, vomiting, constipation, diarrhea, and blood in stool. GU: Denies difficulty urinating, pain with urinating, urgency, frequency, blood in urine. Musculoskeletal: Denies joint stiffness, pain, swelling, muscle weakness. Skin: Denies rash, itching, mass, tumors, sores, and boils Neurologic: Denies headache, fainting, dizziness, seizures, numbness, and tingling. Psychiatric: Denies depression, anxiety, difficulty sleeping, and memory loss. Endocrine: Denies heat or cold intolerance, and increased thirst or urination. Blood/lymph: Denies easy bruising, and swollen glands     Objective:     BP (!) 116/96   Pulse 100   Temp 98.3 F (36.8 C) (Oral)   Resp 17   Ht '4\' 11"'$  (1.499 m)   Wt 54.3 kg   SpO2 99%   BMI 24.18 kg/m    Constitutional :  alert, cooperative, appears stated age, and no distress  Lymphatics/Throat:  no asymmetry, masses, or scars  Respiratory:  clear to auscultation bilaterally  Cardiovascular:  regular rate and rhythm  Gastrointestinal: Soft, no guarding, RUQ pain extending toward RLQ .   Musculoskeletal: Steady movement  Skin: Cool and moist  Psychiatric: Normal affect, non-agitated, not confused       LABS:     Latest Ref Rng & Units 04/11/2022   12:22 PM 01/20/2022    8:57 AM 12/19/2021    4:18 AM  CMP  Glucose 70 - 99 mg/dL 96  113  88   BUN 8 - 23 mg/dL '7  10  19   '$ Creatinine 0.44 - 1.00 mg/dL 0.67  0.69  0.66   Sodium 135 - 145 mmol/L 134  137  136   Potassium 3.5 - 5.1 mmol/L 2.6  3.5  3.4   Chloride 98 - 111 mmol/L 97  107  103   CO2 22 - 32 mmol/L '23  22  26   '$ Calcium 8.9 - 10.3 mg/dL 8.5  8.4  7.9   Total Protein 6.5 - 8.1 g/dL 8.9   6.0   Total Bilirubin 0.3 - 1.2  mg/dL 2.2   3.8   Alkaline Phos 38 - 126 U/L 49   59   AST 15 - 41 U/L 32   68   ALT 0 - 44 U/L 14   23       Latest Ref Rng & Units 04/11/2022   12:22 PM 01/20/2022    8:57 AM 12/21/2021    3:51 PM  CBC  WBC 4.0 - 10.5 K/uL 11.3  1.9  3.9   Hemoglobin 12.0 - 15.0 g/dL 12.8  11.8  11.9   Hematocrit 36.0 - 46.0 % 38.3  35.0  33.4   Platelets 150 - 400 K/uL 180  154  180      RADS: CLINICAL DATA:  Right upper quadrant pain.   EXAM: ULTRASOUND ABDOMEN LIMITED RIGHT UPPER QUADRANT   COMPARISON:  CT, 12/20/2021.   FINDINGS: Gallbladder:   Gallbladder is distended. There are large shadowing stones measuring up to 5 cm. Wall is thickened to 5 mm, nonspecific in the setting of ascites.   Common bile duct:   Diameter: What appears to be the common bile duct measures 9 mm.   Liver:   Coarsened, heterogeneous echotexture. Surface nodularity. Heterogeneous hypoechoic lesion in the left lobe, that appears cystic, 2.5 x 1.8 x 1.5 cm. Oval hypoechoic lesion which may  arise from the posteroinferior left lobe or be posterior to the liver, 3.5 x 1.8 x 2.5 cm. Additional small hypoechoic lesion in the left lobe measuring 8 mm. These lesions not evident on the prior CT. Portal vein is patent on color Doppler imaging with normal direction of blood flow towards the liver.   Other: Ascites.   IMPRESSION: 1. Findings consistent with cirrhosis as noted on the prior CT. There are hypo to anechoic liver lesions that were not evident on the prior CT. This makes cysts unlikely, and these may reflect hypoechoic solid lesions. 2. Multiple gallstones. Gallbladder wall is thickened, but this is a nonspecific finding in the setting of ascites. Cannot exclude or confirm acute cholecystitis. 3. Hypoechoic lesion that is likely posterior to the left liver lobe. This is suspected to be an enlarged porta hepatis lymph node. 4. Recommend follow-up imaging. For clinical suspicion of  acute cholecystitis, recommend HIDA scan. For the liver findings, recommend follow-up liver MRI with and without contrast to assess for possible hepatocellular carcinoma.     Electronically Signed   By: Lajean Manes M.D.   On: 04/11/2022 14:08  CLINICAL DATA:  Pain right upper quadrant, diarrhea   EXAM: CT ABDOMEN AND PELVIS WITH CONTRAST   TECHNIQUE: Multidetector CT imaging of the abdomen and pelvis was performed using the standard protocol following bolus administration of intravenous contrast.   RADIATION DOSE REDUCTION: This exam was performed according to the departmental dose-optimization program which includes automated exposure control, adjustment of the mA and/or kV according to patient size and/or use of iterative reconstruction technique.   CONTRAST:  172m OMNIPAQUE IOHEXOL 300 MG/ML  SOLN   COMPARISON:  Previous studies including CT done on 12/20/2021 and sonogram done on 04/11/2022   FINDINGS: Lower chest: No focal infiltrates are seen in visualized lower lung fields. Heart is enlarged in size. Dense calcification is seen in mitral annulus. Scattered coronary artery calcifications are seen.   Hepatobiliary: There is fatty infiltration in liver. In image 12 of series 3, there is 1.3 cm inhomogeneous enhancing lesion in the right lobe. There are other smaller enhancing lesions in the liver. There is nodularity of the liver surface suggesting possible cirrhosis. There is no dilation of bile ducts. Nausea gallbladder stones are seen. There is no significant wall thickening in gallbladder. There is fluid around the gallbladder which may be part of ascites.   Pancreas: There is atrophy. There is dilation of pancreatic duct. No focal abnormalities are seen.   Spleen: Unremarkable.   Adrenals/Urinary Tract: Adrenals are unremarkable. There is no hydronephrosis. There are no renal or ureteral stones. The 7 mm low-density lesion in the lower pole of left  kidney, possibly a cyst. Urinary bladder is unremarkable.   Stomach/Bowel: Stomach is not distended. Small bowel loops are not dilated. The appendix is not dilated. There is no significant focal wall thickening in colon. There is no pericolic stranding.   Vascular/Lymphatic: Extensive arterial calcifications are seen. There are abnormal enlarged lymph nodes in retroperitoneum measuring up to 2.2 cm in diameter. There is interval increase in number and size of retroperitoneal lymph nodes suggesting progression of metastatic disease. There is a new large lymph node measuring 3.4 x 2.2 cm in size slightly above the head of the pancreas close to support a bodies suggesting metastatic disease.   Reproductive: Uterus is enlarged with multiple lesions of varying sizes suggesting uterine fibroids. Largest of the fibroids measures 7.5 cm in maximum diameter. There are no adnexal masses. Minimal  amount of free fluid is seen in pelvis.   Other: There is no pneumoperitoneum. Small umbilical hernia containing fat is seen.   Musculoskeletal: Degenerative changes are noted in lumbar spine with disc space narrowing, bony spurs and encroachment of neural foramina at multiple levels.   IMPRESSION: There is no evidence of intestinal obstruction or pneumoperitoneum. There is no hydronephrosis.   There is interval appearance of multiple small enhancing lesions in the liver suggesting possible metastatic disease. Cirrhosis of liver.   There are enlarged lymph nodes in retroperitoneum and adjacent to porta hepatis with interval increase in size and number suggesting progression of metastatic lymphadenopathy.   Large gallbladder stones. Small amount of fluid around the gallbladder is possibly small ascites. Less likely possibility would be acute cholecystitis. There is no wall thickening in gallbladder. There is no significant dilation of bile ducts.   Multiple uterine fibroids. Minimal ascites.  Other findings as described in the body of the report.     Electronically Signed   By: Elmer Picker M.D.   On: 04/11/2022 14:54   CLINICAL DATA:  Cholelithiasis right upper quadrant pain, cirrhosis, history of bladder cancer   EXAM: MRI ABDOMEN WITHOUT CONTRAST  (INCLUDING MRCP)   TECHNIQUE: Multiplanar multisequence MR imaging of the abdomen was performed. Heavily T2-weighted images of the biliary and pancreatic ducts were obtained, and three-dimensional MRCP images were rendered by post processing.   COMPARISON:  Same-day CT abdomen pelvis   FINDINGS: Lower chest:  Cardiomegaly.  Trace pleural effusions.   Hepatobiliary: Coarse, nodular cirrhotic morphology of the liver. Numerous subtly T2 hyperintense, somewhat ill-defined lesions throughout the liver parenchyma, for example in the posterior liver dome, hepatic segment VII, measuring up to 1.4 x 1.1 cm (series 9, image 6) and in the inferior left lobe of the liver, hepatic segment III, measuring 3.2 x 2.4 cm (series 9, image 19). Sludge and gallstones in the gallbladder, including a large gallstone measuring at least 4.8 cm and multiple small gallstones near the gallbladder neck (series 4, image 15). Gallbladder wall thickening and pericholecystic fluid (series 4, image 20). Intra and extrahepatic biliary ductal dilatation without obstructing calculus identified to the ampulla, common bile duct measuring up to 1.0 cm in caliber (series 3, image 15).   Pancreas: Unremarkable. No pancreatic ductal dilatation or surrounding inflammatory changes.   Spleen: Normal in size without significant abnormality.   Adrenals/Urinary Tract: Adrenal glands are unremarkable. Kidneys are normal, without renal calculi, solid lesion, or hydronephrosis.   Stomach/Bowel: Stomach is within normal limits. No evidence of bowel wall thickening, distention, or inflammatory changes.   Vascular/Lymphatic: No significant vascular  findings are present. Bulky, matted appearing gastrohepatic ligament, porta hepatis, and retroperitoneal lymph nodes, largest porta hepatis node measuring 2.9 x 1.9 cm (series 9, image 15)   Other: No abdominal wall hernia. Anasarca. Small volume ascites. Partially imaged bulky uterine fibroids (series 3, image 13).   Musculoskeletal: No acute or significant osseous findings.   IMPRESSION: 1. Sludge and gallstones in the gallbladder, including a large gallstone measuring at least 4.8 cm and multiple small gallstones near the gallbladder neck. Gallbladder wall thickening and pericholecystic fluid. Findings are concerning for acute cholecystitis. 2. Intra and extrahepatic biliary ductal dilatation without obstructing calculus or other lesion identified to the ampulla. 3. Numerous subtly T2 hyperintense, somewhat ill-defined lesions throughout the liver parenchyma, highly suspicious for hepatic metastases although incompletely characterized by noncontrast MR. 4. Bulky, matted appearing abdominal lymph nodes, consistent with nodal metastatic disease. 5. Cirrhosis and  ascites. 6. Partially imaged bulky uterine fibroids.     Electronically Signed   By: Delanna Ahmadi M.D.   On: 04/11/2022 21:14   Assessment:      RUQ pain, with above imaging findings.  Etiology of pain could be recurrent cholecystitis, gallbladder wall inflammation and thickening secondary to ascites, or pain secondary to likely liver malignancy, metastatic vs primary  Plan:    Pt with hx of refusal of any gallbladder surgery in the past, she still refuses any surgical intervention tonight understanding the risks involved such as recurrent, persistent symptoms if this is due to acute cholecystitis.  Antibiotics alone likely will not suffice.  At the same time, surgery may cause more risk than harm due to presence of malignancy within vicinity. Eitherway, patient is refusing any surgical intervention at this time.   Recommend antibiotics to treat the possible cholecystitis.    Surgery to sign off.  Further care and management per primary team.  The patient understands the risks, any and all questions were answered to the patient's satisfaction.  labs/images/medications/previous chart entries reviewed personally and relevant changes/updates noted above.

## 2022-04-11 NOTE — Assessment & Plan Note (Signed)
?   Infectious / Colitis/ GIB.  We will follow.  Collect stool and send sample.

## 2022-04-11 NOTE — Assessment & Plan Note (Signed)
replace and follow levels.

## 2022-04-11 NOTE — H&P (Signed)
History and Physical    TAMRE CASS NID:[id number] DOB: Jan 26, 1951 DOA: 04/11/2022  PCP: [facility] [place]    Patient coming from:  Home    Chief Complaint:  Chest pain   HPI:  Kristina Ellis is a 71 y.o. female seen in ed for chest pain/ dow x 2 days and found to be in a.fib rvr.  Patient also has been having diarrhea with some blood since Tuesday worse. Abdominal pain is associated with nausea and dry heaving. Last night patient became acutely short of breath and lightheaded with palpitations. Pt is alert. States that : Has been having diarrhea x 5 days.  Uncontrollable without warning. Pt has had incontinence x for past few days.  Pt also had noted some blood and has red blood. Pt also reports dysuria.  Pt has bladder radiation t/t she has blood stains off and on.  Pt also reports nausea.  No vomiting.  No fever or chills.  Pt reports SOB ++/ Chest pain ++ to the right side she thinks its her liver.  States her liver has been acting up.  Pt reports urge incontinence.  Pt also has had palpitations and has h/o a.fib.  Pt was on eliquis for her a.fib bid.  Heart MD is Dr. Clayborn Bigness.  Pt reports for last few weeks she has been  binge drinking beer due to parties. About 5 beers on the weekend.   Pt has past medical history: Past Medical History:  Diagnosis Date   Aortic atherosclerosis ([place])    Arthritis    Atrial fibrillation ([place])    a.) s/p TEE with cardioversion on 02/04/2020. b.) on daily apixaban.   Bladder mass    a.) CT 04/08/2021 --> 3.2 cm intraluminal bladder mass.   Carotid stenosis, bilateral    a.) Doppler 07/04/2020 --> mild; 1-49% stenosis BILATERALLY.   Cholelithiasis    a.) CT 11/06/2020 --> largest measured 4.5 cm   Chronic anticoagulation    a.) Apixaban   Common biliary duct calculus    a.) CT 04/08/2021 --> CBD dilated at 8 mm; 24m calculus within distal CBD.   Coronary artery disease involving native coronary artery  of native heart without angina pectoris 51/44/3154  Diastolic dysfunction    a.) TTE 07/04/2020 --> LVEF 60-65%; G1DD.   Hepatic cirrhosis (HCC)    Hepatic steatosis    Hepatitis 1970   History of 2019 novel coronavirus disease (COVID-19) 12/20/2019   History of marijuana use    Hypertension    Hypertensive retinopathy    IDA (iron deficiency anemia)    Mitral stenosis    a.) TEE 04/10/2020 --> mild. b.) TTE 07/04/2020 --> EF 60-65%; mild (mean gradient 5 mmHg).   Moderate pulmonary arterial systolic hypertension ([facility] 12/22/2019   Nephrolithiasis    NSTEMI (non-ST elevated myocardial infarction) ([facility] 07/26/2016   Open-angle glaucoma 09/11/2010   PAH (pulmonary artery hypertension) ([facility]    a.) TTE 12/21/2019 --> PASP 44 mmHg.   Uterine fibroid    a.) CT 04/08/2021 --> multiple with largest measuring 7 cm.   Valvular regurgitation    a.) TTE 09/01/2010 --> trivial to mild pan-valvular. b.) TTE 12/21/2019 --> mild MR and AR; moderate TR. c.) TTE 07/04/2020 --> mild TR; trivial MR and PR.   Vestibular neuronitis     Past Surgical History:  Procedure Laterality Date   BREAST CYST ASPIRATION Left    COLONOSCOPY  2012   ERCP N/A 12/18/2021   Procedure: ENDOSCOPIC RETROGRADE  CHOLANGIOPANCREATOGRAPHY (ERCP);  Surgeon: Lucilla Lame, MD;  Location: [facility] ENDOSCOPY;  Service: Endoscopy;  Laterality: N/A;   TEE WITH CARDIOVERSION N/A 02/04/2020   Procedure: TEE WITH CARDIOVERSION; Location: [facility]; Surgeon: Kandis Cocking, MD   TRANSURETHRAL RESECTION OF BLADDER TUMOR N/A 05/15/2021   Procedure: TRANSURETHRAL RESECTION OF BLADDER TUMOR (TURBT);  Surgeon: Billey Co, MD;  Location: [facility] ORS;  Service: Urology;  Laterality: N/A;     reports that she has quit smoking. Her smoking use included cigarettes. She has a 1.00 pack-year smoking history. She has been exposed to tobacco smoke. She has never used smokeless tobacco. She reports current alcohol use of about 5.0 standard drinks of alcohol per  week. She reports that she does not currently use drugs after having used the following drugs: Marijuana.  Allergies  Allergen Reactions   Atenolol Other (See Comments)    bradycardia bradycardia    Penicillins Rash    Family History  Problem Relation Age of Onset   Aneurysm Mother    Colon cancer Father    Lung cancer Brother    Breast cancer Neg Hx     Prior to Admission medications   Medication Sig Start Date End Date Taking? Authorizing Provider  atorvastatin (LIPITOR) 40 MG tablet Take 40 mg by mouth at bedtime. 03/16/22  Yes [provider]  ELIQUIS 5 MG TABS tablet Take 1 tablet (5 mg total) by mouth 2 (two) times daily. 12/22/21 04/11/22 Yes Nolberto Hanlon, MD  magnesium oxide (MAG-OX) 400 MG tablet Take 1 tablet by mouth 2 (two) times daily. 03/24/20  Yes [provider]  metoprolol succinate (TOPROL-XL) 25 MG 24 hr tablet Take 1 tablet (25 mg total) by mouth 2 (two) times daily. Take with or immediately following a meal. 12/22/21 04/11/22 Yes Amery, Gwynneth Albright, MD  amiodarone (PACERONE) 200 MG tablet Take by mouth. 02/02/22 02/02/23  [provider]  amLODipine (NORVASC) 10 MG tablet Take 10 mg by mouth daily. Patient not taking: Reported on 02/11/2022 01/06/22   [provider]  diltiazem (CARDIZEM CD) 120 MG 24 hr capsule Take 1 capsule (120 mg total) by mouth daily. Patient not taking: Reported on 04/11/2022 12/22/21 12/22/22  Nolberto Hanlon, MD  traMADol Veatrice Bourbon) 50 MG tablet Take One tab PO Q6 hours PRN pain Patient not taking: Reported on 04/11/2022 09/22/21   [provider]  trolamine salicylate (ASPERCREME) 10 % cream Apply 1 application topically as needed for muscle pain.    [provider]    Review of Systems:  Review of Systems  Constitutional:  Positive for malaise/fatigue.  Respiratory:  Positive for shortness of breath.   Cardiovascular:  Positive for chest pain and palpitations.  Gastrointestinal:  Positive for abdominal  pain, blood in stool, diarrhea and nausea.  Neurological:  Positive for weakness.  All other systems reviewed and are negative.    ED Course:   > Vitals:   04/11/22 1800 04/11/22 1830 04/11/22 1930 04/11/22 2231  BP: 131/69 137/70 (!) 140/101 (!) 116/96  Pulse: (!) 121 86 (!) 101 100  Resp: '13 14 17   '$ Temp: 98.3 F (36.8 C)     TempSrc: Oral     SpO2: 98% 98% 99%   Weight:      Height:       > Vitals:   04/11/22 1222 04/11/22 1300 04/11/22 1330 04/11/22 1400  BP: (!) 149/102 (!) 141/95 122/78 137/73   04/11/22 1600 04/11/22 1630 04/11/22 1800 04/11/22 1830  BP: 134/86 133/88 131/69 137/70  04/11/22 1930 04/11/22 2231  BP: (!) 140/101 (!) 116/96    >I/O last 3 completed shifts: In: -  Out: 45 [Urine:45] >No intake/output data recorded. >SpO2: 99 %  In ed pt is alert/and oriented and gives history.  Labs shows multiple electrolyte derangements.    Results for orders placed or performed during the hospital encounter of 04/11/22 (from the past 24 hour(s))  Basic metabolic panel     Status: Abnormal   Collection Time: 04/11/22 12:22 PM  Result Value Ref Range   Sodium 134 (L) 135 - 145 mmol/L   Potassium 2.6 (LL) 3.5 - 5.1 mmol/L   Chloride 97 (L) 98 - 111 mmol/L   CO2 23 22 - 32 mmol/L   Glucose, Bld 96 70 - 99 mg/dL   BUN 7 (L) 8 - 23 mg/dL   Creatinine, Ser 0.67 0.44 - 1.00 mg/dL   Calcium 8.5 (L) 8.9 - 10.3 mg/dL   GFR, Estimated >60 >60 mL/min   Anion gap 14 5 - 15  Hepatic function panel     Status: Abnormal   Collection Time: 04/11/22 12:22 PM  Result Value Ref Range   Total Protein 8.9 (H) 6.5 - 8.1 g/dL   Albumin 3.1 (L) 3.5 - 5.0 g/dL   AST 32 15 - 41 U/L   ALT 14 0 - 44 U/L   Alkaline Phosphatase 49 38 - 126 U/L   Total Bilirubin 2.2 (H) 0.3 - 1.2 mg/dL   Bilirubin, Direct 0.9 (H) 0.0 - 0.2 mg/dL   Indirect Bilirubin 1.3 (H) 0.3 - 0.9 mg/dL  Lipase, blood     Status: None   Collection Time: 04/11/22 12:22 PM  Result Value Ref Range   Lipase  27 11 - 51 U/L  Troponin I (High Sensitivity)     Status: None   Collection Time: 04/11/22 12:22 PM  Result Value Ref Range   Troponin I (High Sensitivity) 17 <18 ng/L  CBC with Differential     Status: Abnormal   Collection Time: 04/11/22 12:22 PM  Result Value Ref Range   WBC 11.3 (H) 4.0 - 10.5 K/uL   RBC 3.68 (L) 3.87 - 5.11 MIL/uL   Hemoglobin 12.8 12.0 - 15.0 g/dL   HCT 38.3 36.0 - 46.0 %   MCV 104.1 (H) 80.0 - 100.0 fL   MCH 34.8 (H) 26.0 - 34.0 pg   MCHC 33.4 30.0 - 36.0 g/dL   RDW 13.4 11.5 - 15.5 %   Platelets 180 150 - 400 K/uL   nRBC 0.0 0.0 - 0.2 %   Neutrophils Relative % 86 %   Neutro Abs 9.8 (H) 1.7 - 7.7 K/uL   Lymphocytes Relative 8 %   Lymphs Abs 0.9 0.7 - 4.0 K/uL   Monocytes Relative 5 %   Monocytes Absolute 0.5 0.1 - 1.0 K/uL   Eosinophils Relative 0 %   Eosinophils Absolute 0.0 0.0 - 0.5 K/uL   Basophils Relative 0 %   Basophils Absolute 0.0 0.0 - 0.1 K/uL   Immature Granulocytes 1 %   Abs Immature Granulocytes 0.10 (H) 0.00 - 0.07 K/uL  Brain natriuretic peptide     Status: Abnormal   Collection Time: 04/11/22 12:22 PM  Result Value Ref Range   B Natriuretic Peptide 394.9 (H) 0.0 - 100.0 pg/mL  Lactic acid, plasma     Status: None   Collection Time: 04/11/22  2:06 PM  Result Value Ref Range   Lactic Acid, Venous 1.7 0.5 - 1.9 mmol/L  Troponin I (High Sensitivity)     Status: None   Collection Time: 04/11/22  2:06 PM  Result Value Ref Range   Troponin I (High Sensitivity) 13 <18 ng/L  Lactic acid, plasma     Status: None   Collection Time: 04/11/22  6:53 PM  Result Value Ref Range   Lactic Acid, Venous 1.3 0.5 - 1.9 mmol/L  Urinalysis, Routine w reflex microscopic     Status: Abnormal   Collection Time: 04/11/22  6:53 PM  Result Value Ref Range   Color, Urine YELLOW (A) YELLOW   APPearance CLEAR (A) CLEAR   Specific Gravity, Urine >1.046 (H) 1.005 - 1.030   pH 7.0 5.0 - 8.0   Glucose, UA NEGATIVE NEGATIVE mg/dL   Hgb urine dipstick NEGATIVE  NEGATIVE   Bilirubin Urine NEGATIVE NEGATIVE   Ketones, ur NEGATIVE NEGATIVE mg/dL   Protein, ur NEGATIVE NEGATIVE mg/dL   Nitrite NEGATIVE NEGATIVE   Leukocytes,Ua NEGATIVE NEGATIVE  Magnesium     Status: Abnormal   Collection Time: 04/11/22  6:53 PM  Result Value Ref Range   Magnesium 1.2 (L) 1.7 - 2.4 mg/dL    Urine analysis:    Component Value Date/Time   COLORURINE YELLOW (A) 04/11/2022 1853   APPEARANCEUR CLEAR (A) 04/11/2022 1853   APPEARANCEUR Hazy (A) 01/07/2022 1428   LABSPEC >1.046 (H) 04/11/2022 1853   PHURINE 7.0 04/11/2022 1853   GLUCOSEU NEGATIVE 04/11/2022 1853   HGBUR NEGATIVE 04/11/2022 1853   BILIRUBINUR NEGATIVE 04/11/2022 1853   BILIRUBINUR Negative 01/07/2022 1428   KETONESUR NEGATIVE 04/11/2022 1853   PROTEINUR NEGATIVE 04/11/2022 1853   NITRITE NEGATIVE 04/11/2022 1853   LEUKOCYTESUR NEGATIVE 04/11/2022 1853    EKG: Independently reviewed. Today a.fib rvr.    EKG 12/17/2021:   Radiological Exams on Admission: MR ABDOMEN MRCP WO CONTRAST  Result Date: 04/11/2022 CLINICAL DATA:  Cholelithiasis right upper quadrant pain, cirrhosis, history of bladder cancer EXAM: MRI ABDOMEN WITHOUT CONTRAST  (INCLUDING MRCP) TECHNIQUE: Multiplanar multisequence MR imaging of the abdomen was performed. Heavily T2-weighted images of the biliary and pancreatic ducts were obtained, and three-dimensional MRCP images were rendered by post processing. COMPARISON:  Same-day CT abdomen pelvis FINDINGS: Lower chest:  Cardiomegaly.  Trace pleural effusions. Hepatobiliary: Coarse, nodular cirrhotic morphology of the liver. Numerous subtly T2 hyperintense, somewhat ill-defined lesions throughout the liver parenchyma, for example in the posterior liver dome, hepatic segment VII, measuring up to 1.4 x 1.1 cm (series 9, image 6) and in the inferior left lobe of the liver, hepatic segment III, measuring 3.2 x 2.4 cm (series 9, image 19). Sludge and gallstones in the gallbladder, including a  large gallstone measuring at least 4.8 cm and multiple small gallstones near the gallbladder neck (series 4, image 15). Gallbladder wall thickening and pericholecystic fluid (series 4, image 20). Intra and extrahepatic biliary ductal dilatation without obstructing calculus identified to the ampulla, common bile duct measuring up to 1.0 cm in caliber (series 3, image 15). Pancreas: Unremarkable. No pancreatic ductal dilatation or surrounding inflammatory changes. Spleen: Normal in size without significant abnormality. Adrenals/Urinary Tract: Adrenal glands are unremarkable. Kidneys are normal, without renal calculi, solid lesion, or hydronephrosis. Stomach/Bowel: Stomach is within normal limits. No evidence of bowel wall thickening, distention, or inflammatory changes. Vascular/Lymphatic: No significant vascular findings are present. Bulky, matted appearing gastrohepatic ligament, porta hepatis, and retroperitoneal lymph nodes, largest porta hepatis node measuring 2.9 x 1.9 cm (series 9, image 15) Other: No abdominal wall hernia. Anasarca. Small volume ascites. Partially imaged  bulky uterine fibroids (series 3, image 13). Musculoskeletal: No acute or significant osseous findings. IMPRESSION: 1. Sludge and gallstones in the gallbladder, including a large gallstone measuring at least 4.8 cm and multiple small gallstones near the gallbladder neck. Gallbladder wall thickening and pericholecystic fluid. Findings are concerning for acute cholecystitis. 2. Intra and extrahepatic biliary ductal dilatation without obstructing calculus or other lesion identified to the ampulla. 3. Numerous subtly T2 hyperintense, somewhat ill-defined lesions throughout the liver parenchyma, highly suspicious for hepatic metastases although incompletely characterized by noncontrast MR. 4. Bulky, matted appearing abdominal lymph nodes, consistent with nodal metastatic disease. 5. Cirrhosis and ascites. 6. Partially imaged bulky uterine fibroids.  Electronically Signed   By: Delanna Ahmadi M.D.   On: 04/11/2022 21:14   CT ABDOMEN PELVIS W CONTRAST  Result Date: 04/11/2022 CLINICAL DATA:  Pain right upper quadrant, diarrhea EXAM: CT ABDOMEN AND PELVIS WITH CONTRAST TECHNIQUE: Multidetector CT imaging of the abdomen and pelvis was performed using the standard protocol following bolus administration of intravenous contrast. RADIATION DOSE REDUCTION: This exam was performed according to the departmental dose-optimization program which includes automated exposure control, adjustment of the mA and/or kV according to patient size and/or use of iterative reconstruction technique. CONTRAST:  171m OMNIPAQUE IOHEXOL 300 MG/ML  SOLN COMPARISON:  Previous studies including CT done on 12/20/2021 and sonogram done on 04/11/2022 FINDINGS: Lower chest: No focal infiltrates are seen in visualized lower lung fields. Heart is enlarged in size. Dense calcification is seen in mitral annulus. Scattered coronary artery calcifications are seen. Hepatobiliary: There is fatty infiltration in liver. In image 12 of series 3, there is 1.3 cm inhomogeneous enhancing lesion in the right lobe. There are other smaller enhancing lesions in the liver. There is nodularity of the liver surface suggesting possible cirrhosis. There is no dilation of bile ducts. Nausea gallbladder stones are seen. There is no significant wall thickening in gallbladder. There is fluid around the gallbladder which may be part of ascites. Pancreas: There is atrophy. There is dilation of pancreatic duct. No focal abnormalities are seen. Spleen: Unremarkable. Adrenals/Urinary Tract: Adrenals are unremarkable. There is no hydronephrosis. There are no renal or ureteral stones. The 7 mm low-density lesion in the lower pole of left kidney, possibly a cyst. Urinary bladder is unremarkable. Stomach/Bowel: Stomach is not distended. Small bowel loops are not dilated. The appendix is not dilated. There is no significant focal  wall thickening in colon. There is no pericolic stranding. Vascular/Lymphatic: Extensive arterial calcifications are seen. There are abnormal enlarged lymph nodes in retroperitoneum measuring up to 2.2 cm in diameter. There is interval increase in number and size of retroperitoneal lymph nodes suggesting progression of metastatic disease. There is a new large lymph node measuring 3.4 x 2.2 cm in size slightly above the head of the pancreas close to support a bodies suggesting metastatic disease. Reproductive: Uterus is enlarged with multiple lesions of varying sizes suggesting uterine fibroids. Largest of the fibroids measures 7.5 cm in maximum diameter. There are no adnexal masses. Minimal amount of free fluid is seen in pelvis. Other: There is no pneumoperitoneum. Small umbilical hernia containing fat is seen. Musculoskeletal: Degenerative changes are noted in lumbar spine with disc space narrowing, bony spurs and encroachment of neural foramina at multiple levels. IMPRESSION: There is no evidence of intestinal obstruction or pneumoperitoneum. There is no hydronephrosis. There is interval appearance of multiple small enhancing lesions in the liver suggesting possible metastatic disease. Cirrhosis of liver. There are enlarged lymph nodes in retroperitoneum and  adjacent to porta hepatis with interval increase in size and number suggesting progression of metastatic lymphadenopathy. Large gallbladder stones. Small amount of fluid around the gallbladder is possibly small ascites. Less likely possibility would be acute cholecystitis. There is no wall thickening in gallbladder. There is no significant dilation of bile ducts. Multiple uterine fibroids. Minimal ascites. Other findings as described in the body of the report. Electronically Signed   By: Elmer Picker M.D.   On: 04/11/2022 14:54   US Abdomen Limited RUQ (LIVER/GB)  Result Date: 04/11/2022 CLINICAL DATA:  Right upper quadrant pain. EXAM: ULTRASOUND  ABDOMEN LIMITED RIGHT UPPER QUADRANT COMPARISON:  CT, 12/20/2021. FINDINGS: Gallbladder: Gallbladder is distended. There are large shadowing stones measuring up to 5 cm. Wall is thickened to 5 mm, nonspecific in the setting of ascites. Common bile duct: Diameter: What appears to be the common bile duct measures 9 mm. Liver: Coarsened, heterogeneous echotexture. Surface nodularity. Heterogeneous hypoechoic lesion in the left lobe, that appears cystic, 2.5 x 1.8 x 1.5 cm. Oval hypoechoic lesion which may arise from the posteroinferior left lobe or be posterior to the liver, 3.5 x 1.8 x 2.5 cm. Additional small hypoechoic lesion in the left lobe measuring 8 mm. These lesions not evident on the prior CT. Portal vein is patent on color Doppler imaging with normal direction of blood flow towards the liver. Other: Ascites. IMPRESSION: 1. Findings consistent with cirrhosis as noted on the prior CT. There are hypo to anechoic liver lesions that were not evident on the prior CT. This makes cysts unlikely, and these may reflect hypoechoic solid lesions. 2. Multiple gallstones. Gallbladder wall is thickened, but this is a nonspecific finding in the setting of ascites. Cannot exclude or confirm acute cholecystitis. 3. Hypoechoic lesion that is likely posterior to the left liver lobe. This is suspected to be an enlarged porta hepatis lymph node. 4. Recommend follow-up imaging. For clinical suspicion of acute cholecystitis, recommend HIDA scan. For the liver findings, recommend follow-up liver MRI with and without contrast to assess for possible hepatocellular carcinoma. Electronically Signed   By: Lajean Manes M.D.   On: 04/11/2022 14:08   DG Chest Port 1 View  Result Date: 04/11/2022 CLINICAL DATA:  Chest pain and dyspnea on exertion EXAM: PORTABLE CHEST 1 VIEW COMPARISON:  Radiograph 08/09/2021 FINDINGS: Unchanged cardiomegaly. Heavy mitral annular calcifications. Aortic arch calcifications. There is no focal airspace  consolidation. There is no pleural effusion. No pneumothorax. There is severe bilateral glenohumeral osteoarthritis with bony remodeling on the right. Calcified gallstone noted in the right upper quadrant as seen on prior CT. IMPRESSION: Unchanged cardiomegaly with extensive mitral annular calcifications. No focal airspace disease or overt pulmonary edema. Electronically Signed   By: Maurine Simmering M.D.   On: 04/11/2022 13:02    Physical Exam: Vitals:   04/11/22 1800 04/11/22 1830 04/11/22 1930 04/11/22 2231  BP: 131/69 137/70 (!) 140/101 (!) 116/96  Pulse: (!) 121 86 (!) 101 100  Resp: '13 14 17   '$ Temp: 98.3 F (36.8 C)     TempSrc: Oral     SpO2: 98% 98% 99%   Weight:      Height:       Physical Exam Vitals and nursing note reviewed.  Constitutional:      General: She is not in acute distress.    Appearance: She is not ill-appearing, toxic-appearing or diaphoretic.  HENT:     Head: Normocephalic and atraumatic.     Right Ear: Hearing and external ear normal.  Left Ear: Hearing and external ear normal.     Nose: Nose normal. No nasal deformity.     Mouth/Throat:     Lips: Pink.     Mouth: Mucous membranes are moist.     Tongue: No lesions.  Eyes:     General: Lids are normal.     Extraocular Movements: Extraocular movements intact.  Cardiovascular:     Rate and Rhythm: Normal rate. Rhythm irregular.     Pulses: Normal pulses.     Heart sounds: Normal heart sounds.  Pulmonary:     Effort: Pulmonary effort is normal.     Breath sounds: Normal breath sounds.  Abdominal:     General: Bowel sounds are normal. There is no distension.     Palpations: Abdomen is soft. There is no mass.     Tenderness: There is no abdominal tenderness. There is no guarding.     Hernia: No hernia is present.  Musculoskeletal:     Right lower leg: No edema.     Left lower leg: No edema.  Skin:    General: Skin is warm.  Neurological:     General: No focal deficit present.     Mental Status:  She is alert and oriented to person, place, and time.     Cranial Nerves: Cranial nerves 2-12 are intact.     Motor: Motor function is intact.  Psychiatric:        Attention and Perception: Attention normal.        Mood and Affect: Mood normal.        Speech: Speech normal.    Assessment and Plan: * Chest pain Pt states she was having intermittent rt sided lower chest pain.  Suspect noncardiac and related to her cholecystitis.  Also could be from a.fib rvr causing strain TNI negative.  ekg shows some st changes in avl but we will follow TNI and am team to seek cardiology consult for eval of chest pain as pot has h./o NSTEMI.    Atrial fibrillation with rapid ventricular response (HCC) Attribute rvr to hypomagnesemia secondary to alcohol binge.  Currently rate controlled.    Acute cholecystitis Per gen surg pt has been seen and has refused surgical treatment.    Hypokalemia replace and follow level.    Hypomagnesemia replace and follow levels.    Bladder cancer ([place]) Pt has intermittent hematuria.  Diarrhea ? Infectious / Colitis/ GIB.  We will follow.  Collect stool and send sample.   Alcohol use Pt denies any excessive alcohol use but was on bigne for past few weeks while at [employer].  CIWA. Follow low mag and treat.  IV PPI. Fall precaution.  Aspiration precaution.       Unresulted Labs (From admission, onward)     Start     Ordered   04/12/22 0500  Comprehensive metabolic panel  Tomorrow morning,   R        04/11/22 2205   04/12/22 0500  CBC  Tomorrow morning,   R        04/11/22 2205   04/12/22 0500  Magnesium  Tomorrow morning,   R        04/11/22 2205             DVT prophylaxis:  SCD's   Code Status:  Full Code    Family Communication:  Mcbroom,Sharon (Sister)  [phone] (Mobile)   Disposition Plan:  TBD.   Consults called:  General surgery: Dr.Sakai.   Admission  status: Inpatient    Unit: Progressive.    Para Skeans MD [facility]  6 PM- 2 AM. Please contact me via secure Chat 6 PM-2 AM. [phone] ( Pager ) To contact the [facility] Attending or Consulting provider [place] or covering provider during after hours [place], for this patient.   Check the care team in [facility] and look for a) attending/consulting [facility] provider listed and b) the [facility] team listed Log into [email or web address] and use [place]'s universal password to access. If you do not have the password, please contact the hospital operator. Locate the [facility] provider you are looking for under [facility] and page to a number that you can be directly reached. If you still have difficulty reaching the provider, please page the [facility] (Director on Call) for the Hospitalists listed on amion for assistance. [email or web address] 04/11/2022, 11:05 PM

## 2022-04-11 NOTE — Assessment & Plan Note (Signed)
Pt denies any excessive alcohol use but was on bigne for past few weeks while at [employer].  CIWA. Follow low mag and treat.  IV PPI. Fall precaution.  Aspiration precaution.

## 2022-04-11 NOTE — Assessment & Plan Note (Signed)
Pt has intermittent hematuria.

## 2022-04-11 NOTE — Assessment & Plan Note (Signed)
Pt states she was having intermittent rt sided lower chest pain.  Suspect noncardiac and related to her cholecystitis.  Also could be from a.fib rvr causing strain TNI negative.  ekg shows some st changes in avl but we will follow TNI and am team to seek cardiology consult for eval of chest pain as pot has h./o NSTEMI.

## 2022-04-11 NOTE — ED Provider Notes (Signed)
-----------------------------------------   9:25 PM on 04/11/2022 ----------------------------------------- Patient care assumed from Dr. Cherylann Banas  We have been waiting on MRCP, hospitalist and surgeon are both aware of the patient however they were requesting MRCP prior to admission to ensure that there is no CBD stone that would necessitate transfer to a different facility.  MRCP has resulted showing no CBD stone but there is concern for possible acute cholecystitis.  I spoke to Dr. Lysle Pearl of general surgery who recommend starting antibiotics for the time being.  We will start Rocephin and Flagyl given her penicillin allergy.  We will admit to the hospitalist service the patient still has atrial fibrillation although now rate controlled on a diltiazem infusion around 90 bpm.  Patient's MRCP as well as abdominal CT are concerning for enlarged abdominal lymph nodes.  I spoke to the patient she states she was recently diagnosed with bladder cancer but finished treatment in January and was told that they were able to remove all of the cancer.  This is concerning for possible metastatic disease in the abdomen as well as liver.  I discussed this with the patient as well as the plan to admit to medicine with surgery consulting.  Patient agreeable to plan of care.   Harvest Dark, MD 04/11/22 2127

## 2022-04-11 NOTE — ED Provider Notes (Signed)
[facility] Provider Note    Event Date/Time   First MD Initiated Contact with Patient 04/11/22 1224     (approximate)   History   Abdominal Pain, Chest Pain, and Shortness of Breath   HPI  Kristina Ellis is a 71 y.o. female with history of atrial fibrillation on Eliquis, bladder cancer status post resection, chemo, and radiation, CAD, liver cirrhosis, hypertension, and cholecystitis who presents with right-sided upper abdominal pain which has been present for about 3 days and is associated with nausea and dry heaving.  She denies associated diarrhea or fever.  The patient states that since last night she became acutely short of breath and lightheaded.  She feels her heart racing.      Physical Exam   Triage Vital Signs: ED Triage Vitals  Enc Vitals Group     BP 04/11/22 1222 (!) 149/102     Pulse Rate 04/11/22 1222 (!) 189     Resp 04/11/22 1222 (!) 26     Temp 04/11/22 1227 97.6 F (36.4 C)     Temp Source 04/11/22 1227 Oral     SpO2 04/11/22 1227 99 %     Weight 04/11/22 1223 119 lb 11.4 oz (54.3 kg)     Height 04/11/22 1223 '4\' 11"'$  (1.499 m)     Head Circumference --      Peak Flow --      Pain Score 04/11/22 1223 6     Pain Loc --      Pain Edu? --      Excl. in [place]? --     Most recent vital signs: Vitals:   04/11/22 1400 04/11/22 1500  BP: 137/73   Pulse: (!) 113 (!) 102  Resp: (!) 23 13  Temp:    SpO2: 98% 99%     General: Alert and oriented, comfortable appearing. CV:  Good peripheral perfusion.  Tachycardic, regular rhythm. Resp:  Normal effort.  Lungs CTA B. Abd:  Soft with moderate right upper quadrant tenderness.  No distention.  Other:  No scleral icterus.  No peripheral edema.   ED Results / Procedures / Treatments   Labs (all labs ordered are listed, but only abnormal results are displayed) Labs Reviewed  BASIC METABOLIC PANEL - Abnormal; Notable for the following components:      Result Value   Sodium 134 (*)     Potassium 2.6 (*)    Chloride 97 (*)    BUN 7 (*)    Calcium 8.5 (*)    All other components within normal limits  HEPATIC FUNCTION PANEL - Abnormal; Notable for the following components:   Total Protein 8.9 (*)    Albumin 3.1 (*)    Total Bilirubin 2.2 (*)    Bilirubin, Direct 0.9 (*)    Indirect Bilirubin 1.3 (*)    All other components within normal limits  CBC WITH DIFFERENTIAL/PLATELET - Abnormal; Notable for the following components:   WBC 11.3 (*)    RBC 3.68 (*)    MCV 104.1 (*)    MCH 34.8 (*)    Neutro Abs 9.8 (*)    Abs Immature Granulocytes 0.10 (*)    All other components within normal limits  BRAIN NATRIURETIC PEPTIDE - Abnormal; Notable for the following components:   B Natriuretic Peptide 394.9 (*)    All other components within normal limits  LACTIC ACID, PLASMA  LIPASE, BLOOD  LACTIC ACID, PLASMA  URINALYSIS, ROUTINE W REFLEX MICROSCOPIC  MAGNESIUM  TROPONIN I (HIGH SENSITIVITY)  TROPONIN I (HIGH SENSITIVITY)     EKG  ED ECG REPORT I, Arta Silence, the attending physician, personally viewed and interpreted this ECG.  Date: 04/11/2022 EKG Time: 1214 Rate: 186 Rhythm: Atrial fibrillation with RVR QRS Axis: Left axis Intervals: normal ST/T Wave abnormalities: Nonspecific ST abnormalities Narrative Interpretation: Atrial fibrillation with RVR     RADIOLOGY  Chest x-ray: I independently viewed and interpreted the images; there is no focal consolidation or edema.  US abdomen RUQ: I independently viewed and interpreted the images; there are gallstones with no obvious signs of acute cholecystitis.  CT abdomen/pelvis:   IMPRESSION:  There is no evidence of intestinal obstruction or pneumoperitoneum.  There is no hydronephrosis.    There is interval appearance of multiple small enhancing lesions in  the liver suggesting possible metastatic disease. Cirrhosis of  liver.    There are enlarged lymph nodes in retroperitoneum and adjacent  to  porta hepatis with interval increase in size and number suggesting  progression of metastatic lymphadenopathy.    Large gallbladder stones. Small amount of fluid around the  gallbladder is possibly small ascites. Less likely possibility would  be acute cholecystitis. There is no wall thickening in gallbladder.  There is no significant dilation of bile ducts.    Multiple uterine fibroids. Minimal ascites. Other findings as  described in the body of the report.      PROCEDURES:  Critical Care performed: Yes, see critical care procedure note(s)  .Critical Care  Performed by: Arta Silence, MD Authorized by: Arta Silence, MD   Critical care provider statement:    Critical care time (minutes):  30   Critical care was necessary to treat or prevent imminent or life-threatening deterioration of the following conditions:  Circulatory failure   Critical care was time spent personally by me on the following activities:  Development of treatment plan with patient or surrogate, discussions with consultants, evaluation of patient's response to treatment, examination of patient, ordering and review of laboratory studies, ordering and review of radiographic studies, ordering and performing treatments and interventions, pulse oximetry, re-evaluation of patient's condition, review of old charts and obtaining history from patient or surrogate   Care discussed with: admitting provider      MEDICATIONS ORDERED IN ED: Medications  diltiazem (CARDIZEM) 125 mg in dextrose 5% 125 mL (1 mg/mL) infusion (10 mg/hr Intravenous Rate/Dose Change 04/11/22 1254)  diltiazem (CARDIZEM) 25 MG/5ML injection (  Not Given 04/11/22 1521)  potassium chloride 10 mEq in 100 mL IVPB (has no administration in time range)  potassium chloride 10 mEq in 100 mL IVPB (has no administration in time range)  potassium chloride SA (KLOR-CON M) CR tablet 40 mEq (has no administration in time range)  sodium chloride 0.9  % bolus 1,000 mL (0 mLs Intravenous Stopped 04/11/22 1316)  ondansetron (ZOFRAN) injection 4 mg (4 mg Intravenous Given 04/11/22 1234)  morphine (PF) 4 MG/ML injection 4 mg (4 mg Intravenous Given 04/11/22 1234)  iohexol (OMNIPAQUE) 300 MG/ML solution 100 mL (100 mLs Intravenous Contrast Given 04/11/22 1426)     IMPRESSION / MDM / [place] / ED COURSE  I reviewed the triage vital signs and the nursing notes.  71 year old female with PMH as noted above presents with 3 days of right-sided abdominal pain and nausea, with acute onset of shortness of breath, lightheadedness, and palpitations since last night.  I reviewed the past medical records.  The patient was admitted in May.  Per the  hospitalist discharge summary from 5/16 she presented with abdominal pain and was found to be in atrial fibrillation.  She was treated with a diltiazem infusion.  Imaging showed findings of acute cholecystitis.  The patient had an ERCP with biliary sphincterotomy and extraction but she declined percutaneous drainage of her gallbladder surgery.  On exam today, the patient presents once again in rapid atrial fibrillation with a rate in the 180s.  Her other vital signs are normal.  She is tender in the right upper quadrant.  Differential diagnosis includes, but is not limited to, recurrent acute cholecystitis, biliary obstruction, pancreatitis, diverticulitis, colitis, appendicitis, gastritis, gastroparesis.  Patient's presentation is most consistent with acute presentation with potential threat to life or bodily function.  We will give fluids, start the patient on a Cardizem infusion for rate control, analgesia, antiemetics, and obtain lab work-up and imaging.  The patient is on the cardiac monitor to evaluate for evidence of arrhythmia and/or significant heart rate changes.  ----------------------------------------- 3:27 PM on 04/11/2022 -----------------------------------------  Heart rate is markedly  improved on the Cardizem drip.  Ultrasound shows mild pericholecystic fluid which is nonspecific and may be due to ascites.  CT shows gallstones and similar fluid but no wall thickening or obvious signs of acute cholecystitis.  Bilirubin is borderline elevated.  Other LFTs are normal.  There is mild leukocytosis.  Lab work-up is also significant for hypokalemia.  I have ordered IV potassium repletion.  I consulted Dr. Lysle Pearl from general surgery to evaluate the gallbladder findings and determine need for further work-up or treatment.  I also consulted Dr. Francine Graven from the hospitalist service; based on her discussion she agrees to admit the patient.   FINAL CLINICAL IMPRESSION(S) / ED DIAGNOSES   Final diagnoses:  Atrial fibrillation with RVR ([place])  Abdominal pain, unspecified abdominal location     Rx / DC Orders   ED Discharge Orders     None        Note:  This document was prepared using Dragon voice recognition software and may include unintentional dictation errors.    Arta Silence, MD 04/11/22 1530

## 2022-04-11 NOTE — ED Notes (Addendum)
Date and time results received: 04/11/22 1401 (use smartphrase ".now" to insert current time)  Test: K+ Critical Value: 2.6  Name of Provider Notified: Siadecki

## 2022-04-11 NOTE — Assessment & Plan Note (Signed)
replace and follow level.

## 2022-04-11 NOTE — Assessment & Plan Note (Signed)
Per gen surg pt has been seen and has refused surgical treatment.

## 2022-04-11 NOTE — ED Triage Notes (Signed)
Pt pulled straight to triage.  EKG done at 1214.  Pt taken straight to room, in afib RVR.  Has had CP and DOE since  yesterday.  Also c/o diarrhea with some blood since Tuesday and RUQ pain that is worse on palpation.  Dr Garfield Cornea and code cart to room with pt.

## 2022-04-11 NOTE — Assessment & Plan Note (Signed)
Attribute rvr to hypomagnesemia secondary to alcohol binge.  Currently rate controlled.

## 2022-04-11 NOTE — ED Notes (Signed)
Pt to CT scan.

## 2022-04-12 DIAGNOSIS — R079 Chest pain, unspecified: Secondary | ICD-10-CM | POA: Diagnosis not present

## 2022-04-12 LAB — COMPREHENSIVE METABOLIC PANEL
ALT: 12 U/L (ref 0–44)
AST: 24 U/L (ref 15–41)
Albumin: 2.3 g/dL — ABNORMAL LOW (ref 3.5–5.0)
Alkaline Phosphatase: 37 U/L — ABNORMAL LOW (ref 38–126)
Anion gap: 8 (ref 5–15)
BUN: 10 mg/dL (ref 8–23)
CO2: 21 mmol/L — ABNORMAL LOW (ref 22–32)
Calcium: 7.8 mg/dL — ABNORMAL LOW (ref 8.9–10.3)
Chloride: 109 mmol/L (ref 98–111)
Creatinine, Ser: 0.75 mg/dL (ref 0.44–1.00)
GFR, Estimated: >60 mL/min (ref >60)
Glucose, Bld: 90 mg/dL (ref 70–99)
Potassium: 4.3 mmol/L (ref 3.5–5.1)
Sodium: 138 mmol/L (ref 135–145)
Total Bilirubin: 2.2 mg/dL — ABNORMAL HIGH (ref 0.3–1.2)
Total Protein: 6.7 g/dL (ref 6.5–8.1)

## 2022-04-12 LAB — CBC
HCT: 29.3 % — ABNORMAL LOW (ref 36.0–46.0)
Hemoglobin: 9.5 g/dL — ABNORMAL LOW (ref 12.0–15.0)
MCH: 35.1 pg — ABNORMAL HIGH (ref 26.0–34.0)
MCHC: 32.4 g/dL (ref 30.0–36.0)
MCV: 108.1 fL — ABNORMAL HIGH (ref 80.0–100.0)
Platelets: 137 10*3/uL — ABNORMAL LOW (ref 150–400)
RBC: 2.71 MIL/uL — ABNORMAL LOW (ref 3.87–5.11)
RDW: 13.8 % (ref 11.5–15.5)
WBC: 10.5 10*3/uL (ref 4.0–10.5)
nRBC: 0 % (ref 0.0–0.2)

## 2022-04-12 LAB — MAGNESIUM: Magnesium: 2 mg/dL (ref 1.7–2.4)

## 2022-04-12 MED ORDER — SODIUM CHLORIDE 0.9 % IV SOLN
2.0000 g | INTRAVENOUS | Status: DC
  Administered 2022-04-12 – 2022-04-13 (×2): 2 g via INTRAVENOUS
  Filled 2022-04-12 (×2): qty 20
  Filled 2022-04-12: qty 2

## 2022-04-12 NOTE — ED Notes (Addendum)
Spoke with MD Dwyane Dee, states stop dilt gtt now. Ordering PO meds. Aware pt has been taking Amio at home. Per MD ok to take PO Amio at 1000 am with Dilt. States ok to eat, diet ordered. Pt agreeable with plan will monitor.

## 2022-04-12 NOTE — Consult Note (Signed)
CARDIOLOGY CONSULT NOTE               Patient ID: Kristina Ellis MRN: [id number] DOB/AGE: 08-14-1950 71 y.o.  Admit date: 04/11/2022 Referring Physician Florina Ou hospitalist Primary Physician Princella Ion  community health Primary Cardiologist [facility] Reason for Consultation chest pain rapid atrial fibrillation shortness of breath  HPI: Patient is a 71 year old female history of bladder cancer alcohol abuse hypertension hyperlipidemia peripheral vascular disease recently complained of severe diarrhea over about a week some incontinence dysuria she has been receiving radiation for bladder cancer complains of some nausea no real vomiting but then come in with shortness of breath and chest pain.  Patient complained of vague chest pain as well as right-sided pain flank discomfort has had some mild palpitation tachycardia no block as well as a syncope came to emergency room for further evaluation  Review of systems complete and found to be negative unless listed above     Past Medical History:  Diagnosis Date   Aortic atherosclerosis ([place])    Arthritis    Atrial fibrillation ([place])    a.) s/p TEE with cardioversion on 02/04/2020. b.) on daily apixaban.   Bladder mass    a.) CT 04/08/2021 --> 3.2 cm intraluminal bladder mass.   Carotid stenosis, bilateral    a.) Doppler 07/04/2020 --> mild; 1-49% stenosis BILATERALLY.   Cholelithiasis    a.) CT 11/06/2020 --> largest measured 4.5 cm   Chronic anticoagulation    a.) Apixaban   Common biliary duct calculus    a.) CT 04/08/2021 --> CBD dilated at 8 mm; 31m calculus within distal CBD.   Coronary artery disease involving native coronary artery of native heart without angina pectoris 54/07/8785  Diastolic dysfunction    a.) TTE 07/04/2020 --> LVEF 60-65%; G1DD.   Hepatic cirrhosis (HCC)    Hepatic steatosis    Hepatitis 1970   History of 2019 novel coronavirus disease (COVID-19) 12/20/2019   History of marijuana use     Hypertension    Hypertensive retinopathy    IDA (iron deficiency anemia)    Mitral stenosis    a.) TEE 04/10/2020 --> mild. b.) TTE 07/04/2020 --> EF 60-65%; mild (mean gradient 5 mmHg).   Moderate pulmonary arterial systolic hypertension [place] 12/22/2019   Nephrolithiasis    NSTEMI (non-ST elevated myocardial infarction) ([place] 07/26/2016   Open-angle glaucoma 09/11/2010   PAH (pulmonary artery hypertension) [place]    a.) TTE 12/21/2019 --> PASP 44 mmHg.   Uterine fibroid    a.) CT 04/08/2021 --> multiple with largest measuring 7 cm.   Valvular regurgitation    a.) TTE 09/01/2010 --> trivial to mild pan-valvular. b.) TTE 12/21/2019 --> mild MR and AR; moderate TR. c.) TTE 07/04/2020 --> mild TR; trivial MR and PR.   Vestibular neuronitis     Past Surgical History:  Procedure Laterality Date   BREAST CYST ASPIRATION Left    COLONOSCOPY  2012   ERCP N/A 12/18/2021   Procedure: ENDOSCOPIC RETROGRADE CHOLANGIOPANCREATOGRAPHY (ERCP);  Surgeon: WLucilla Lame MD;  [facility];  Service: Endoscopy;  Laterality: N/A;   TEE WITH CARDIOVERSION N/A 02/04/2020   Procedure: TEE WITH CARDIOVERSION; Location: [facility]; Surgeon: CKandis Cocking MD   TRANSURETHRAL RESECTION OF BLADDER TUMOR N/A 05/15/2021   Procedure: TRANSURETHRAL RESECTION OF BLADDER TUMOR (TURBT);  Surgeon: SBilley Co MD;  Location: [facility] ORS;  Service: Urology;  Laterality: N/A;    Medications Prior to Admission  Medication Sig Dispense Refill Last Dose   atorvastatin (  LIPITOR) 40 MG tablet Take 40 mg by mouth at bedtime.   04/10/2022   ELIQUIS 5 MG TABS tablet Take 1 tablet (5 mg total) by mouth 2 (two) times daily. 60 tablet 0 04/10/2022   magnesium oxide (MAG-OX) 400 MG tablet Take 1 tablet by mouth 2 (two) times daily.   04/10/2022   metoprolol succinate (TOPROL-XL) 25 MG 24 hr tablet Take 1 tablet (25 mg total) by mouth 2 (two) times daily. Take with or immediately following a meal. 60 tablet 0 04/10/2022   amiodarone (PACERONE)  200 MG tablet Take by mouth.   04/09/2022   amLODipine (NORVASC) 10 MG tablet Take 10 mg by mouth daily. (Patient not taking: Reported on 02/11/2022)   Not Taking   diltiazem (CARDIZEM CD) 120 MG 24 hr capsule Take 1 capsule (120 mg total) by mouth daily. (Patient not taking: Reported on 04/11/2022) 30 capsule 0 Not Taking   traMADol (ULTRAM) 50 MG tablet Take One tab PO Q6 hours PRN pain (Patient not taking: Reported on 04/11/2022)   Not Taking   trolamine salicylate (ASPERCREME) 10 % cream Apply 1 application topically as needed for muscle pain.   PRN at PRN   Social History   Socioeconomic History   Marital status: Single    Spouse name: Not on file   Number of children: 1   Years of education: Not on file   Highest education level: Not on file  Occupational History   Not on file  Tobacco Use   Smoking status: Former    Packs/day: 0.10    Years: 10.00    Total pack years: 1.00    Types: Cigarettes    Passive exposure: Past   Smokeless tobacco: Never   Tobacco comments:    occasional smoker  Vaping Use   Vaping Use: Never used  Substance and Sexual Activity   Alcohol use: Yes    Alcohol/week: 5.0 standard drinks of alcohol    Types: 5 Cans of beer per week    Comment: weekly   Drug use: Not Currently    Types: 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, 70's   Sexual activity: Not on file  Other Topics Concern   Not on file  Social History Narrative   Lives with son   Social Determinants of Health   Financial Resource Strain: Not on file  Food Insecurity: Not on file  Transportation Needs: Not on file  Physical Activity: Not on file  Stress: Not on file  Social Connections: Not on file  Intimate Partner Violence: Not on file    Family History  Problem Relation Age of Onset   Aneurysm Mother    Colon cancer Father    Lung cancer Brother    Breast cancer Neg Hx       Review of systems complete and found to be negative unless listed above      PHYSICAL  EXAM  General: Well developed, well nourished, in no acute distress HEENT:  Normocephalic and atramatic Neck:  No JVD.  Lungs: Clear bilaterally to auscultation and percussion. Heart: HRRR . Normal S1 and S2 without gallops or murmurs.  Abdomen: Bowel sounds are positive, abdomen soft and non-tender  Msk:  Back normal, normal gait. Normal strength and tone for age. Extremities: No clubbing, cyanosis or edema.   Neuro: Alert and oriented X 3. Psych:  Good affect, responds appropriately  Labs:   Lab Results  Component Value Date   WBC 10.5 04/12/2022  HGB 9.5 (L) 04/12/2022   HCT 29.3 (L) 04/12/2022   MCV 108.1 (H) 04/12/2022   PLT 137 (L) 04/12/2022    Recent Labs  Lab 04/12/22 0452  NA 138  K 4.3  CL 109  CO2 21*  BUN 10  CREATININE 0.75  CALCIUM 7.8*  PROT 6.7  BILITOT 2.2*  ALKPHOS 37*  ALT 12  AST 24  GLUCOSE 90   Lab Results  Component Value Date   TROPONINI 0.33 (HH) 07/27/2016    Lab Results  Component Value Date   CHOL 98 07/27/2016   Lab Results  Component Value Date   HDL 42 07/27/2016   Lab Results  Component Value Date   LDLCALC 46 07/27/2016   Lab Results  Component Value Date   TRIG 50 07/27/2016   Lab Results  Component Value Date   CHOLHDL 2.3 07/27/2016   No results found for: "LDLDIRECT"    Radiology: MR 3D Recon At Scanner  Result Date: 04/12/2022 CLINICAL DATA:  Cholelithiasis right upper quadrant pain, cirrhosis, history of bladder cancer EXAM: MRI ABDOMEN WITHOUT CONTRAST  (INCLUDING MRCP) TECHNIQUE: Multiplanar multisequence MR imaging of the abdomen was performed. Heavily T2-weighted images of the biliary and pancreatic ducts were obtained, and three-dimensional MRCP images were rendered by post processing. COMPARISON:  Same-day CT abdomen pelvis FINDINGS: Lower chest:  Cardiomegaly.  Trace pleural effusions. Hepatobiliary: Coarse, nodular cirrhotic morphology of the liver. Numerous subtly T2 hyperintense, somewhat ill-defined  lesions throughout the liver parenchyma, for example in the posterior liver dome, hepatic segment VII, measuring up to 1.4 x 1.1 cm (series 9, image 6) and in the inferior left lobe of the liver, hepatic segment III, measuring 3.2 x 2.4 cm (series 9, image 19). Sludge and gallstones in the gallbladder, including a large gallstone measuring at least 4.8 cm and multiple small gallstones near the gallbladder neck (series 4, image 15). Gallbladder wall thickening and pericholecystic fluid (series 4, image 20). Intra and extrahepatic biliary ductal dilatation without obstructing calculus identified to the ampulla, common bile duct measuring up to 1.0 cm in caliber (series 3, image 15). Pancreas: Unremarkable. No pancreatic ductal dilatation or surrounding inflammatory changes. Spleen: Normal in size without significant abnormality. Adrenals/Urinary Tract: Adrenal glands are unremarkable. Kidneys are normal, without renal calculi, solid lesion, or hydronephrosis. Stomach/Bowel: Stomach is within normal limits. No evidence of bowel wall thickening, distention, or inflammatory changes. Vascular/Lymphatic: No significant vascular findings are present. Bulky, matted appearing gastrohepatic ligament, porta hepatis, and retroperitoneal lymph nodes, largest porta hepatis node measuring 2.9 x 1.9 cm (series 9, image 15) Other: No abdominal wall hernia. Anasarca. Small volume ascites. Partially imaged bulky uterine fibroids (series 3, image 13). Musculoskeletal: No acute or significant osseous findings. IMPRESSION: 1. Sludge and gallstones in the gallbladder, including a large gallstone measuring at least 4.8 cm and multiple small gallstones near the gallbladder neck. Gallbladder wall thickening and pericholecystic fluid. Findings are concerning for acute cholecystitis. 2. Intra and extrahepatic biliary ductal dilatation without obstructing calculus or other lesion identified to the ampulla. 3. Numerous subtly T2 hyperintense,  somewhat ill-defined lesions throughout the liver parenchyma, highly suspicious for hepatic metastases although incompletely characterized by noncontrast MR. 4. Bulky, matted appearing abdominal lymph nodes, consistent with nodal metastatic disease. 5. Cirrhosis and ascites. 6. Partially imaged bulky uterine fibroids. Electronically Signed   By: Delanna Ahmadi M.D.   On: 04/12/2022 05:55   MR ABDOMEN MRCP WO CONTRAST  Result Date: 04/11/2022 CLINICAL DATA:  Cholelithiasis right upper quadrant pain,  cirrhosis, history of bladder cancer EXAM: MRI ABDOMEN WITHOUT CONTRAST  (INCLUDING MRCP) TECHNIQUE: Multiplanar multisequence MR imaging of the abdomen was performed. Heavily T2-weighted images of the biliary and pancreatic ducts were obtained, and three-dimensional MRCP images were rendered by post processing. COMPARISON:  Same-day CT abdomen pelvis FINDINGS: Lower chest:  Cardiomegaly.  Trace pleural effusions. Hepatobiliary: Coarse, nodular cirrhotic morphology of the liver. Numerous subtly T2 hyperintense, somewhat ill-defined lesions throughout the liver parenchyma, for example in the posterior liver dome, hepatic segment VII, measuring up to 1.4 x 1.1 cm (series 9, image 6) and in the inferior left lobe of the liver, hepatic segment III, measuring 3.2 x 2.4 cm (series 9, image 19). Sludge and gallstones in the gallbladder, including a large gallstone measuring at least 4.8 cm and multiple small gallstones near the gallbladder neck (series 4, image 15). Gallbladder wall thickening and pericholecystic fluid (series 4, image 20). Intra and extrahepatic biliary ductal dilatation without obstructing calculus identified to the ampulla, common bile duct measuring up to 1.0 cm in caliber (series 3, image 15). Pancreas: Unremarkable. No pancreatic ductal dilatation or surrounding inflammatory changes. Spleen: Normal in size without significant abnormality. Adrenals/Urinary Tract: Adrenal glands are unremarkable. Kidneys  are normal, without renal calculi, solid lesion, or hydronephrosis. Stomach/Bowel: Stomach is within normal limits. No evidence of bowel wall thickening, distention, or inflammatory changes. Vascular/Lymphatic: No significant vascular findings are present. Bulky, matted appearing gastrohepatic ligament, porta hepatis, and retroperitoneal lymph nodes, largest porta hepatis node measuring 2.9 x 1.9 cm (series 9, image 15) Other: No abdominal wall hernia. Anasarca. Small volume ascites. Partially imaged bulky uterine fibroids (series 3, image 13). Musculoskeletal: No acute or significant osseous findings. IMPRESSION: 1. Sludge and gallstones in the gallbladder, including a large gallstone measuring at least 4.8 cm and multiple small gallstones near the gallbladder neck. Gallbladder wall thickening and pericholecystic fluid. Findings are concerning for acute cholecystitis. 2. Intra and extrahepatic biliary ductal dilatation without obstructing calculus or other lesion identified to the ampulla. 3. Numerous subtly T2 hyperintense, somewhat ill-defined lesions throughout the liver parenchyma, highly suspicious for hepatic metastases although incompletely characterized by noncontrast MR. 4. Bulky, matted appearing abdominal lymph nodes, consistent with nodal metastatic disease. 5. Cirrhosis and ascites. 6. Partially imaged bulky uterine fibroids. Electronically Signed   By: Delanna Ahmadi M.D.   On: 04/11/2022 21:14   CT ABDOMEN PELVIS W CONTRAST  Result Date: 04/11/2022 CLINICAL DATA:  Pain right upper quadrant, diarrhea EXAM: CT ABDOMEN AND PELVIS WITH CONTRAST TECHNIQUE: Multidetector CT imaging of the abdomen and pelvis was performed using the standard protocol following bolus administration of intravenous contrast. RADIATION DOSE REDUCTION: This exam was performed according to the departmental dose-optimization program which includes automated exposure control, adjustment of the mA and/or kV according to patient  size and/or use of iterative reconstruction technique. CONTRAST:  127m OMNIPAQUE IOHEXOL 300 MG/ML  SOLN COMPARISON:  Previous studies including CT done on 12/20/2021 and sonogram done on 04/11/2022 FINDINGS: Lower chest: No focal infiltrates are seen in visualized lower lung fields. Heart is enlarged in size. Dense calcification is seen in mitral annulus. Scattered coronary artery calcifications are seen. Hepatobiliary: There is fatty infiltration in liver. In image 12 of series 3, there is 1.3 cm inhomogeneous enhancing lesion in the right lobe. There are other smaller enhancing lesions in the liver. There is nodularity of the liver surface suggesting possible cirrhosis. There is no dilation of bile ducts. Nausea gallbladder stones are seen. There is no significant wall thickening in  gallbladder. There is fluid around the gallbladder which may be part of ascites. Pancreas: There is atrophy. There is dilation of pancreatic duct. No focal abnormalities are seen. Spleen: Unremarkable. Adrenals/Urinary Tract: Adrenals are unremarkable. There is no hydronephrosis. There are no renal or ureteral stones. The 7 mm low-density lesion in the lower pole of left kidney, possibly a cyst. Urinary bladder is unremarkable. Stomach/Bowel: Stomach is not distended. Small bowel loops are not dilated. The appendix is not dilated. There is no significant focal wall thickening in colon. There is no pericolic stranding. Vascular/Lymphatic: Extensive arterial calcifications are seen. There are abnormal enlarged lymph nodes in retroperitoneum measuring up to 2.2 cm in diameter. There is interval increase in number and size of retroperitoneal lymph nodes suggesting progression of metastatic disease. There is a new large lymph node measuring 3.4 x 2.2 cm in size slightly above the head of the pancreas close to support a bodies suggesting metastatic disease. Reproductive: Uterus is enlarged with multiple lesions of varying sizes suggesting  uterine fibroids. Largest of the fibroids measures 7.5 cm in maximum diameter. There are no adnexal masses. Minimal amount of free fluid is seen in pelvis. Other: There is no pneumoperitoneum. Small umbilical hernia containing fat is seen. Musculoskeletal: Degenerative changes are noted in lumbar spine with disc space narrowing, bony spurs and encroachment of neural foramina at multiple levels. IMPRESSION: There is no evidence of intestinal obstruction or pneumoperitoneum. There is no hydronephrosis. There is interval appearance of multiple small enhancing lesions in the liver suggesting possible metastatic disease. Cirrhosis of liver. There are enlarged lymph nodes in retroperitoneum and adjacent to porta hepatis with interval increase in size and number suggesting progression of metastatic lymphadenopathy. Large gallbladder stones. Small amount of fluid around the gallbladder is possibly small ascites. Less likely possibility would be acute cholecystitis. There is no wall thickening in gallbladder. There is no significant dilation of bile ducts. Multiple uterine fibroids. Minimal ascites. Other findings as described in the body of the report. Electronically Signed   By: Elmer Picker M.D.   On: 04/11/2022 14:54   US Abdomen Limited RUQ (LIVER/GB)  Result Date: 04/11/2022 CLINICAL DATA:  Right upper quadrant pain. EXAM: ULTRASOUND ABDOMEN LIMITED RIGHT UPPER QUADRANT COMPARISON:  CT, 12/20/2021. FINDINGS: Gallbladder: Gallbladder is distended. There are large shadowing stones measuring up to 5 cm. Wall is thickened to 5 mm, nonspecific in the setting of ascites. Common bile duct: Diameter: What appears to be the common bile duct measures 9 mm. Liver: Coarsened, heterogeneous echotexture. Surface nodularity. Heterogeneous hypoechoic lesion in the left lobe, that appears cystic, 2.5 x 1.8 x 1.5 cm. Oval hypoechoic lesion which may arise from the posteroinferior left lobe or be posterior to the liver, 3.5 x  1.8 x 2.5 cm. Additional small hypoechoic lesion in the left lobe measuring 8 mm. These lesions not evident on the prior CT. Portal vein is patent on color Doppler imaging with normal direction of blood flow towards the liver. Other: Ascites. IMPRESSION: 1. Findings consistent with cirrhosis as noted on the prior CT. There are hypo to anechoic liver lesions that were not evident on the prior CT. This makes cysts unlikely, and these may reflect hypoechoic solid lesions. 2. Multiple gallstones. Gallbladder wall is thickened, but this is a nonspecific finding in the setting of ascites. Cannot exclude or confirm acute cholecystitis. 3. Hypoechoic lesion that is likely posterior to the left liver lobe. This is suspected to be an enlarged porta hepatis lymph node. 4. Recommend follow-up imaging.  For clinical suspicion of acute cholecystitis, recommend HIDA scan. For the liver findings, recommend follow-up liver MRI with and without contrast to assess for possible hepatocellular carcinoma. Electronically Signed   By: Lajean Manes M.D.   On: 04/11/2022 14:08   DG Chest Port 1 View  Result Date: 04/11/2022 CLINICAL DATA:  Chest pain and dyspnea on exertion EXAM: PORTABLE CHEST 1 VIEW COMPARISON:  Radiograph 08/09/2021 FINDINGS: Unchanged cardiomegaly. Heavy mitral annular calcifications. Aortic arch calcifications. There is no focal airspace consolidation. There is no pleural effusion. No pneumothorax. There is severe bilateral glenohumeral osteoarthritis with bony remodeling on the right. Calcified gallstone noted in the right upper quadrant as seen on prior CT. IMPRESSION: Unchanged cardiomegaly with extensive mitral annular calcifications. No focal airspace disease or overt pulmonary edema. Electronically Signed   By: Maurine Simmering M.D.   On: 04/11/2022 13:02    EKG: Atrial fibrillation rapid ventricular response narrow complex rate of around 180  ASSESSMENT AND PLAN:  Chest pain Atrial fibrillation Congestive  heart failure Lower GI bleeding Hypertension Diarrhea Shortness of breath Chest pain Palpitations EtOH abuse Bladder cancer . Plan Agree with admit rule out for myocardial infarction follow-up troponins EKGs Continue Eliquis for anticoagulation and rate control for A-fib amiodarone for rhythm metoprolol Cardizem IV diuretic therapy for mild heart failure Follow-up H&H as recommend GI for evaluation of possible lower GI bleeding Shortness of breath possibly related to anemia A-fib Diarrhea unclear etiology recommend GI evaluation Agree with broad-spectrum antibiotic therapy for possible infection Maintain statin therapy for hyperlipidemia   Signed: Yolonda Kida MD 04/12/2022, 10:00 PM

## 2022-04-12 NOTE — Progress Notes (Addendum)
PROGRESS NOTE    Kristina Ellis  WFU:[id number] DOB: 1951-02-21 DOA: 04/11/2022  PCP: [facility] [place]  Brief Narrative:  This 71 yrs old female with PMH significant for atrial fibrillation, cholelithiasis, bladder cancer, bilateral carotid stenosis, diastolic CHF, essential hypertension, pulmonary artery hypertension, presented in the ED with complaints of chest pain for 2 days, she also reports having abdominal pain associated with nausea , vomiting and diarrhea.  Patient was s acutely hypoxic and lightheaded with palpitations in the ED , he was found to have A-fib with RVR.  Patient reports she has been binge drinking beer due to parties for last few weekends.  Patient is admitted for further evaluation  Assessment & Plan:   Principal Problem:   Chest pain Active Problems:   Atrial fibrillation with rapid ventricular response (HCC)   Acute cholecystitis   Hypokalemia   Hypomagnesemia   Bladder cancer (HCC)   Alcohol use   Diarrhea  Chest pain: Patient reports having intermittent right-sided chest pain likely noncardiac and related to cholecystitis. Patient also found to have A-fib with RVR  causing heart strain. EKG shows some ST-T wave changes in aVL but troponins negative. Cardiology is consulted for further evaluation.  A-fib with RVR.: Heart rate is now reasonably controlled.   Cardizem gtt. discontinued.   Continue amiodarone 200 mg daily Continue metoprolol 25 mg twice  daily Continue Eliquis 5 mg twice daily.  Acute cholecystitis?: Patient does have findings consistent with acute cholecystitis.  Patient has been refusing surgery.  Continue ceftriaxone and Flagyl. General  surgery signed off.  Hypokalemia: Replaced and resolved.  Hypomagnesemia.   Replaced.  Continue to monitor  Bladder cancer: Patient does have intermittent hematuria, follows with urology outpatient.   Continue outpatient follow-up  Diarrhea Likely  infectious/colitis/gallbladder disease. Follow-up stool studies.  EtOH abuse: Patient denies excessive alcohol use but reports doing binge drinking beer on weekends. Continue CIWA protocol. Fall precautions, aspiration precautions,  PT and OT evaluation.:    DVT prophylaxis: Eliquis Code Status: Full code Family Communication: No family at bed side. Disposition Plan:   Status is: Inpatient Remains inpatient appropriate because: Admitted with chest pain A-fib with RVR and abdominal pain.  Heart rate is now reasonably controlled.  Cardiology is consulted.   Anticipated discharge home in few days  Consultants:  General surgery/cardiology  Procedures: None Antimicrobials:  Anti-infectives (From admission, onward)    Start     Dose/Rate Route Frequency Ordered Stop   04/12/22 1800  cefTRIAXone (ROCEPHIN) 2 g in sodium chloride 0.9 % 100 mL IVPB        2 g 200 mL/hr over 30 Minutes Intravenous Every 24 hours 04/12/22 0850 04/16/22 1759   04/12/22 1000  metroNIDAZOLE (FLAGYL) IVPB 500 mg        500 mg 100 mL/hr over 60 Minutes Intravenous Every 12 hours 04/11/22 2203     04/11/22 2215  cefTRIAXone (ROCEPHIN) 1 g in sodium chloride 0.9 % 100 mL IVPB  Status:  Discontinued        1 g 200 mL/hr over 30 Minutes Intravenous Every 24 hours 04/11/22 2203 04/12/22 0850   04/11/22 2130  cefTRIAXone (ROCEPHIN) 1 g in sodium chloride 0.9 % 100 mL IVPB  Status:  Discontinued        1 g 200 mL/hr over 30 Minutes Intravenous  Once 04/11/22 2125 04/11/22 2204   04/11/22 2130  metroNIDAZOLE (FLAGYL) IVPB 500 mg        500 mg 100 mL/hr  over 60 Minutes Intravenous  Once 04/11/22 2125 04/11/22 2345        Subjective: Patient was seen and examined at bedside.  Overnight events noted. Patient reports having abdominal pain and asked for pain medication.  Denies any shortness of breath.  Objective: Vitals:   04/12/22 0700 04/12/22 0730 04/12/22 1100 04/12/22 1400  BP: 135/78 136/72 132/80  138/85  Pulse: (!) 124 77 77 78  Resp:  '20 15 18  '$ Temp:   98.7 F (37.1 C) 98.4 F (36.9 C)  TempSrc:   Oral Oral  SpO2: 97% 98% 97% 98%  Weight:      Height:        Intake/Output Summary (Last 24 hours) at 04/12/2022 1446 Last data filed at 04/12/2022 1300 Gross per 24 hour  Intake --  Output 495 ml  Net -495 ml   Filed Weights   04/11/22 1223  Weight: 54.3 kg    Examination:  General exam: Appears deconditioned, sick looking, not in any acute distress Respiratory system: CTA bilaterally, no wheezing, no crackles, normal respiratory effort Cardiovascular system: S1 & S2 heard, irregular rhythm, no murmur.   Gastrointestinal system: Abdomen is soft soft, mildly tender, nondistended, BS +. Central nervous system: Alert and oriented x 3. No focal neurological deficits. Extremities: No edema, no cyanosis, no clubbing Skin: No rashes, lesions or ulcers Psychiatry: Judgement and insight appear normal. Mood & affect appropriate.     Data Reviewed: I have personally reviewed following labs and imaging studies  CBC: Recent Labs  Lab 04/11/22 1222 04/12/22 0452  WBC 11.3* 10.5  NEUTROABS 9.8*  --   HGB 12.8 9.5*  HCT 38.3 29.3*  MCV 104.1* 108.1*  PLT 180 680*   Basic Metabolic Panel: Recent Labs  Lab 04/11/22 1222 04/11/22 1853 04/12/22 0452  NA 134*  --  138  K 2.6*  --  4.3  CL 97*  --  109  CO2 23  --  21*  GLUCOSE 96  --  90  BUN 7*  --  10  CREATININE 0.67  --  0.75  CALCIUM 8.5*  --  7.8*  MG  --  1.2* 2.0   GFR: Estimated Creatinine Clearance: 48.5 mL/min (by C-G formula based on SCr of 0.75 mg/dL). Liver Function Tests: Recent Labs  Lab 04/11/22 1222 04/12/22 0452  AST 32 24  ALT 14 12  ALKPHOS 49 37*  BILITOT 2.2* 2.2*  PROT 8.9* 6.7  ALBUMIN 3.1* 2.3*   Recent Labs  Lab 04/11/22 1222  LIPASE 27   No results for input(s): "AMMONIA" in the last 168 hours. Coagulation Profile: No results for input(s): "INR", "PROTIME" in the last 168  hours. Cardiac Enzymes: No results for input(s): "CKTOTAL", "CKMB", "CKMBINDEX", "TROPONINI" in the last 168 hours. BNP (last 3 results) No results for input(s): "PROBNP" in the last 8760 hours. HbA1C: No results for input(s): "HGBA1C" in the last 72 hours. CBG: No results for input(s): "GLUCAP" in the last 168 hours. Lipid Profile: No results for input(s): "CHOL", "HDL", "LDLCALC", "TRIG", "CHOLHDL", "LDLDIRECT" in the last 72 hours. Thyroid Function Tests: No results for input(s): "TSH", "T4TOTAL", "FREET4", "T3FREE", "THYROIDAB" in the last 72 hours. Anemia Panel: No results for input(s): "VITAMINB12", "FOLATE", "FERRITIN", "TIBC", "IRON", "RETICCTPCT" in the last 72 hours. Sepsis Labs: Recent Labs  Lab 04/11/22 1406 04/11/22 1853  LATICACIDVEN 1.7 1.3    No results found for this or any previous visit (from the past 240 hour(s)).    Radiology Studies: MR  3D Recon At Scanner  Result Date: 04/12/2022 CLINICAL DATA:  Cholelithiasis right upper quadrant pain, cirrhosis, history of bladder cancer EXAM: MRI ABDOMEN WITHOUT CONTRAST  (INCLUDING MRCP) TECHNIQUE: Multiplanar multisequence MR imaging of the abdomen was performed. Heavily T2-weighted images of the biliary and pancreatic ducts were obtained, and three-dimensional MRCP images were rendered by post processing. COMPARISON:  Same-day CT abdomen pelvis FINDINGS: Lower chest:  Cardiomegaly.  Trace pleural effusions. Hepatobiliary: Coarse, nodular cirrhotic morphology of the liver. Numerous subtly T2 hyperintense, somewhat ill-defined lesions throughout the liver parenchyma, for example in the posterior liver dome, hepatic segment VII, measuring up to 1.4 x 1.1 cm (series 9, image 6) and in the inferior left lobe of the liver, hepatic segment III, measuring 3.2 x 2.4 cm (series 9, image 19). Sludge and gallstones in the gallbladder, including a large gallstone measuring at least 4.8 cm and multiple small gallstones near the gallbladder  neck (series 4, image 15). Gallbladder wall thickening and pericholecystic fluid (series 4, image 20). Intra and extrahepatic biliary ductal dilatation without obstructing calculus identified to the ampulla, common bile duct measuring up to 1.0 cm in caliber (series 3, image 15). Pancreas: Unremarkable. No pancreatic ductal dilatation or surrounding inflammatory changes. Spleen: Normal in size without significant abnormality. Adrenals/Urinary Tract: Adrenal glands are unremarkable. Kidneys are normal, without renal calculi, solid lesion, or hydronephrosis. Stomach/Bowel: Stomach is within normal limits. No evidence of bowel wall thickening, distention, or inflammatory changes. Vascular/Lymphatic: No significant vascular findings are present. Bulky, matted appearing gastrohepatic ligament, porta hepatis, and retroperitoneal lymph nodes, largest porta hepatis node measuring 2.9 x 1.9 cm (series 9, image 15) Other: No abdominal wall hernia. Anasarca. Small volume ascites. Partially imaged bulky uterine fibroids (series 3, image 13). Musculoskeletal: No acute or significant osseous findings. IMPRESSION: 1. Sludge and gallstones in the gallbladder, including a large gallstone measuring at least 4.8 cm and multiple small gallstones near the gallbladder neck. Gallbladder wall thickening and pericholecystic fluid. Findings are concerning for acute cholecystitis. 2. Intra and extrahepatic biliary ductal dilatation without obstructing calculus or other lesion identified to the ampulla. 3. Numerous subtly T2 hyperintense, somewhat ill-defined lesions throughout the liver parenchyma, highly suspicious for hepatic metastases although incompletely characterized by noncontrast MR. 4. Bulky, matted appearing abdominal lymph nodes, consistent with nodal metastatic disease. 5. Cirrhosis and ascites. 6. Partially imaged bulky uterine fibroids. Electronically Signed   By: Delanna Ahmadi M.D.   On: 04/12/2022 05:55   MR ABDOMEN MRCP WO  CONTRAST  Result Date: 04/11/2022 CLINICAL DATA:  Cholelithiasis right upper quadrant pain, cirrhosis, history of bladder cancer EXAM: MRI ABDOMEN WITHOUT CONTRAST  (INCLUDING MRCP) TECHNIQUE: Multiplanar multisequence MR imaging of the abdomen was performed. Heavily T2-weighted images of the biliary and pancreatic ducts were obtained, and three-dimensional MRCP images were rendered by post processing. COMPARISON:  Same-day CT abdomen pelvis FINDINGS: Lower chest:  Cardiomegaly.  Trace pleural effusions. Hepatobiliary: Coarse, nodular cirrhotic morphology of the liver. Numerous subtly T2 hyperintense, somewhat ill-defined lesions throughout the liver parenchyma, for example in the posterior liver dome, hepatic segment VII, measuring up to 1.4 x 1.1 cm (series 9, image 6) and in the inferior left lobe of the liver, hepatic segment III, measuring 3.2 x 2.4 cm (series 9, image 19). Sludge and gallstones in the gallbladder, including a large gallstone measuring at least 4.8 cm and multiple small gallstones near the gallbladder neck (series 4, image 15). Gallbladder wall thickening and pericholecystic fluid (series 4, image 20). Intra and extrahepatic biliary ductal dilatation  without obstructing calculus identified to the ampulla, common bile duct measuring up to 1.0 cm in caliber (series 3, image 15). Pancreas: Unremarkable. No pancreatic ductal dilatation or surrounding inflammatory changes. Spleen: Normal in size without significant abnormality. Adrenals/Urinary Tract: Adrenal glands are unremarkable. Kidneys are normal, without renal calculi, solid lesion, or hydronephrosis. Stomach/Bowel: Stomach is within normal limits. No evidence of bowel wall thickening, distention, or inflammatory changes. Vascular/Lymphatic: No significant vascular findings are present. Bulky, matted appearing gastrohepatic ligament, porta hepatis, and retroperitoneal lymph nodes, largest porta hepatis node measuring 2.9 x 1.9 cm (series 9,  image 15) Other: No abdominal wall hernia. Anasarca. Small volume ascites. Partially imaged bulky uterine fibroids (series 3, image 13). Musculoskeletal: No acute or significant osseous findings. IMPRESSION: 1. Sludge and gallstones in the gallbladder, including a large gallstone measuring at least 4.8 cm and multiple small gallstones near the gallbladder neck. Gallbladder wall thickening and pericholecystic fluid. Findings are concerning for acute cholecystitis. 2. Intra and extrahepatic biliary ductal dilatation without obstructing calculus or other lesion identified to the ampulla. 3. Numerous subtly T2 hyperintense, somewhat ill-defined lesions throughout the liver parenchyma, highly suspicious for hepatic metastases although incompletely characterized by noncontrast MR. 4. Bulky, matted appearing abdominal lymph nodes, consistent with nodal metastatic disease. 5. Cirrhosis and ascites. 6. Partially imaged bulky uterine fibroids. Electronically Signed   By: Delanna Ahmadi M.D.   On: 04/11/2022 21:14   CT ABDOMEN PELVIS W CONTRAST  Result Date: 04/11/2022 CLINICAL DATA:  Pain right upper quadrant, diarrhea EXAM: CT ABDOMEN AND PELVIS WITH CONTRAST TECHNIQUE: Multidetector CT imaging of the abdomen and pelvis was performed using the standard protocol following bolus administration of intravenous contrast. RADIATION DOSE REDUCTION: This exam was performed according to the departmental dose-optimization program which includes automated exposure control, adjustment of the mA and/or kV according to patient size and/or use of iterative reconstruction technique. CONTRAST:  141m OMNIPAQUE IOHEXOL 300 MG/ML  SOLN COMPARISON:  Previous studies including CT done on 12/20/2021 and sonogram done on 04/11/2022 FINDINGS: Lower chest: No focal infiltrates are seen in visualized lower lung fields. Heart is enlarged in size. Dense calcification is seen in mitral annulus. Scattered coronary artery calcifications are seen.  Hepatobiliary: There is fatty infiltration in liver. In image 12 of series 3, there is 1.3 cm inhomogeneous enhancing lesion in the right lobe. There are other smaller enhancing lesions in the liver. There is nodularity of the liver surface suggesting possible cirrhosis. There is no dilation of bile ducts. Nausea gallbladder stones are seen. There is no significant wall thickening in gallbladder. There is fluid around the gallbladder which may be part of ascites. Pancreas: There is atrophy. There is dilation of pancreatic duct. No focal abnormalities are seen. Spleen: Unremarkable. Adrenals/Urinary Tract: Adrenals are unremarkable. There is no hydronephrosis. There are no renal or ureteral stones. The 7 mm low-density lesion in the lower pole of left kidney, possibly a cyst. Urinary bladder is unremarkable. Stomach/Bowel: Stomach is not distended. Small bowel loops are not dilated. The appendix is not dilated. There is no significant focal wall thickening in colon. There is no pericolic stranding. Vascular/Lymphatic: Extensive arterial calcifications are seen. There are abnormal enlarged lymph nodes in retroperitoneum measuring up to 2.2 cm in diameter. There is interval increase in number and size of retroperitoneal lymph nodes suggesting progression of metastatic disease. There is a new large lymph node measuring 3.4 x 2.2 cm in size slightly above the head of the pancreas close to support a bodies suggesting metastatic disease.  Reproductive: Uterus is enlarged with multiple lesions of varying sizes suggesting uterine fibroids. Largest of the fibroids measures 7.5 cm in maximum diameter. There are no adnexal masses. Minimal amount of free fluid is seen in pelvis. Other: There is no pneumoperitoneum. Small umbilical hernia containing fat is seen. Musculoskeletal: Degenerative changes are noted in lumbar spine with disc space narrowing, bony spurs and encroachment of neural foramina at multiple levels. IMPRESSION:  There is no evidence of intestinal obstruction or pneumoperitoneum. There is no hydronephrosis. There is interval appearance of multiple small enhancing lesions in the liver suggesting possible metastatic disease. Cirrhosis of liver. There are enlarged lymph nodes in retroperitoneum and adjacent to porta hepatis with interval increase in size and number suggesting progression of metastatic lymphadenopathy. Large gallbladder stones. Small amount of fluid around the gallbladder is possibly small ascites. Less likely possibility would be acute cholecystitis. There is no wall thickening in gallbladder. There is no significant dilation of bile ducts. Multiple uterine fibroids. Minimal ascites. Other findings as described in the body of the report. Electronically Signed   By: Elmer Picker M.D.   On: 04/11/2022 14:54   US Abdomen Limited RUQ (LIVER/GB)  Result Date: 04/11/2022 CLINICAL DATA:  Right upper quadrant pain. EXAM: ULTRASOUND ABDOMEN LIMITED RIGHT UPPER QUADRANT COMPARISON:  CT, 12/20/2021. FINDINGS: Gallbladder: Gallbladder is distended. There are large shadowing stones measuring up to 5 cm. Wall is thickened to 5 mm, nonspecific in the setting of ascites. Common bile duct: Diameter: What appears to be the common bile duct measures 9 mm. Liver: Coarsened, heterogeneous echotexture. Surface nodularity. Heterogeneous hypoechoic lesion in the left lobe, that appears cystic, 2.5 x 1.8 x 1.5 cm. Oval hypoechoic lesion which may arise from the posteroinferior left lobe or be posterior to the liver, 3.5 x 1.8 x 2.5 cm. Additional small hypoechoic lesion in the left lobe measuring 8 mm. These lesions not evident on the prior CT. Portal vein is patent on color Doppler imaging with normal direction of blood flow towards the liver. Other: Ascites. IMPRESSION: 1. Findings consistent with cirrhosis as noted on the prior CT. There are hypo to anechoic liver lesions that were not evident on the prior CT. This makes  cysts unlikely, and these may reflect hypoechoic solid lesions. 2. Multiple gallstones. Gallbladder wall is thickened, but this is a nonspecific finding in the setting of ascites. Cannot exclude or confirm acute cholecystitis. 3. Hypoechoic lesion that is likely posterior to the left liver lobe. This is suspected to be an enlarged porta hepatis lymph node. 4. Recommend follow-up imaging. For clinical suspicion of acute cholecystitis, recommend HIDA scan. For the liver findings, recommend follow-up liver MRI with and without contrast to assess for possible hepatocellular carcinoma. Electronically Signed   By: Lajean Manes M.D.   On: 04/11/2022 14:08   DG Chest Port 1 View  Result Date: 04/11/2022 CLINICAL DATA:  Chest pain and dyspnea on exertion EXAM: PORTABLE CHEST 1 VIEW COMPARISON:  Radiograph 08/09/2021 FINDINGS: Unchanged cardiomegaly. Heavy mitral annular calcifications. Aortic arch calcifications. There is no focal airspace consolidation. There is no pleural effusion. No pneumothorax. There is severe bilateral glenohumeral osteoarthritis with bony remodeling on the right. Calcified gallstone noted in the right upper quadrant as seen on prior CT. IMPRESSION: Unchanged cardiomegaly with extensive mitral annular calcifications. No focal airspace disease or overt pulmonary edema. Electronically Signed   By: Maurine Simmering M.D.   On: 04/11/2022 13:02    Scheduled Meds:  amiodarone  200 mg Oral Daily  apixaban  5 mg Oral BID   atorvastatin  40 mg Oral Daily   folic acid  1 mg Oral Daily   metoprolol succinate  25 mg Oral BID   multivitamin with minerals  1 tablet Oral Daily   sodium chloride flush  3 mL Intravenous Q12H   thiamine (VITAMIN B1) injection  100 mg Intravenous Daily   Continuous Infusions:  sodium chloride 30 mL/hr at 04/11/22 1254   sodium chloride 50 mL/hr at 04/11/22 2228   cefTRIAXone (ROCEPHIN)  IV     diltiazem (CARDIZEM) infusion Stopped (04/12/22 0802)   metronidazole 500 mg  (04/12/22 1012)     LOS: 1 day    Time spent: 50 mins    Tram Wrenn, MD [facility]   If 7PM-7AM, please contact night-coverage

## 2022-04-12 NOTE — ED Notes (Signed)
Pt c/o not being able to void sent MD message awaiting response. Bladder scan greater then 400 ml. Report given to [facility] will transfer.

## 2022-04-12 NOTE — Plan of Care (Signed)

## 2022-04-12 NOTE — TOC Initial Note (Signed)
Transition of Care [facility]) - Initial/Assessment Note    Patient Details  Name: Kristina Ellis MRN: [id number] Date of Birth: Apr 03, 1951  Transition of Care [facility]) CM/SW Contact:    Ninfa Meeker, RN Phone Number: 04/12/2022, 4:10 PM  Clinical Narrative:                 Transition of Care Screening Note:  Transition of Care Department [facility]) has reviewed patient and no TOC needs have been identified at this time. We will continue to monitor patient advancement through Interdisciplinary progressions. If new patient transition needs arise, please place a consult.       Patient Goals and CMS Choice        Expected Discharge Plan and Services                                                Prior Living Arrangements/Services                       Activities of Daily Living Home Assistive Devices/Equipment: Cane (specify quad or straight) ADL Screening (condition at time of admission) Patient's cognitive ability adequate to safely complete daily activities?: Yes Is the patient deaf or have difficulty hearing?: No Does the patient have difficulty seeing, even when wearing glasses/contacts?: No Does the patient have difficulty concentrating, remembering, or making decisions?: No Patient able to express need for assistance with ADLs?: Yes Does the patient have difficulty dressing or bathing?: No Independently performs ADLs?: Yes (appropriate for developmental age) Does the patient have difficulty walking or climbing stairs?: No Weakness of Legs: None Weakness of Arms/Hands: None  Permission Sought/Granted                  Emotional Assessment              Admission diagnosis:  Cholecystitis, acute [K81.0] Atrial fibrillation with rapid ventricular response ([place]) [I48.91] Atrial fibrillation with RVR ([place]) [I48.91] Chest pain [R07.9] Abdominal pain, unspecified abdominal location [R10.9] Patient Active Problem List   Diagnosis Date Noted    Atrial fibrillation with rapid ventricular response ([place]) 04/11/2022   Hypomagnesemia 04/11/2022   Chest pain 04/11/2022   Alcohol use 04/11/2022   Diarrhea 04/11/2022   Calculus of common duct    Duodenal stenosis    Acute cholecystitis 12/17/2021   UTI (urinary tract infection) 12/17/2021   Lactic acidosis 08/09/2021   Elevated troponin level not due myocardial infarction 08/09/2021   Acute pain of left shoulder 08/09/2021   Hypokalemia 08/09/2021   Bladder cancer ([place]) 05/29/2021   Moderate pulmonary arterial systolic hypertension ([place]) 12/22/2019   Severe tricuspid regurgitation 12/22/2019   Coronary artery disease involving native coronary artery of native heart without angina pectoris 12/21/2019   NSTEMI (non-ST elevated myocardial infarction) ([place]) 07/26/2016   Open-angle glaucoma 09/11/2010   PCP:  [facility] [place]:   Juanda Crumble DREW COMM [place], [address] [place] [address] Phone: [phone] Fax: [place] [address] ([address]), [place] [address] [place]) [place] [address] Phone: [phone] Fax: [phone]     Social Determinants of Health (SDOH) Interventions    Readmission Risk Interventions     No data to display

## 2022-04-12 NOTE — ED Notes (Signed)
Assumed care, pt alert and oriented. With no needs, will follow up on diet pt still NPO. Reports pressure in bladder d/t decreased urination. Will bladder scan and reach out to MD. IV meds per [facility].

## 2022-04-13 DIAGNOSIS — R079 Chest pain, unspecified: Secondary | ICD-10-CM | POA: Diagnosis not present

## 2022-04-13 DIAGNOSIS — R109 Unspecified abdominal pain: Secondary | ICD-10-CM

## 2022-04-13 DIAGNOSIS — Z789 Other specified health status: Secondary | ICD-10-CM | POA: Diagnosis not present

## 2022-04-13 LAB — MAGNESIUM: Magnesium: 1.9 mg/dL (ref 1.7–2.4)

## 2022-04-13 LAB — COMPREHENSIVE METABOLIC PANEL
ALT: 15 U/L (ref 0–44)
AST: 39 U/L (ref 15–41)
Albumin: 2.2 g/dL — ABNORMAL LOW (ref 3.5–5.0)
Alkaline Phosphatase: 39 U/L (ref 38–126)
Anion gap: 5 (ref 5–15)
BUN: 14 mg/dL (ref 8–23)
CO2: 22 mmol/L (ref 22–32)
Calcium: 7.8 mg/dL — ABNORMAL LOW (ref 8.9–10.3)
Chloride: 108 mmol/L (ref 98–111)
Creatinine, Ser: 0.72 mg/dL (ref 0.44–1.00)
GFR, Estimated: >60 mL/min (ref >60)
Glucose, Bld: 85 mg/dL (ref 70–99)
Potassium: 4 mmol/L (ref 3.5–5.1)
Sodium: 135 mmol/L (ref 135–145)
Total Bilirubin: 1.1 mg/dL (ref 0.3–1.2)
Total Protein: 6.7 g/dL (ref 6.5–8.1)

## 2022-04-13 LAB — CBC
HCT: 29.6 % — ABNORMAL LOW (ref 36.0–46.0)
Hemoglobin: 9.7 g/dL — ABNORMAL LOW (ref 12.0–15.0)
MCH: 35.3 pg — ABNORMAL HIGH (ref 26.0–34.0)
MCHC: 32.8 g/dL (ref 30.0–36.0)
MCV: 107.6 fL — ABNORMAL HIGH (ref 80.0–100.0)
Platelets: 146 10*3/uL — ABNORMAL LOW (ref 150–400)
RBC: 2.75 MIL/uL — ABNORMAL LOW (ref 3.87–5.11)
RDW: 13.6 % (ref 11.5–15.5)
WBC: 6.4 10*3/uL (ref 4.0–10.5)
nRBC: 0 % (ref 0.0–0.2)

## 2022-04-13 LAB — PHOSPHORUS: Phosphorus: 2.1 mg/dL — ABNORMAL LOW (ref 2.5–4.6)

## 2022-04-13 MED ORDER — DILTIAZEM HCL 30 MG PO TABS
60.0000 mg | ORAL_TABLET | Freq: Two times a day (BID) | ORAL | Status: AC
Start: 2022-04-13 — End: 2022-04-13
  Administered 2022-04-13 (×2): 60 mg via ORAL
  Filled 2022-04-13 (×2): qty 2

## 2022-04-13 MED ORDER — DILTIAZEM HCL ER COATED BEADS 120 MG PO CP24
120.0000 mg | ORAL_CAPSULE | Freq: Every day | ORAL | Status: DC
  Administered 2022-04-14: 120 mg via ORAL
  Filled 2022-04-13: qty 1

## 2022-04-13 NOTE — Consult Note (Signed)
Kristina Lame, MD [facility]  [address], [place], [place] [address] Phone: [phone] Fax : [phone]  Consultation  Referring Provider:     Dr. Dwyane Dee Primary Care Physician:  [facility] [place] Primary Gastroenterologist:  Dr. Virgina Jock         Reason for Consultation:     chronic diarrhea, abd pain, consistent with acute cholecystitis  Date of Admission:  04/11/2022 Date of Consultation:  04/13/2022         HPI:   Kristina Ellis is a 71 y.o. female who has been seen in the past by Dr. Virgina Jock and Dr. Vicente Males and had an ERCP by me back in March.  At that time the patient had a sphincterotomy and stone extraction.  The patient had an MRI that showed:  IMPRESSION: 1. Sludge and gallstones in the gallbladder, including a large gallstone measuring at least 4.8 cm and multiple small gallstones near the gallbladder neck. Gallbladder wall thickening and pericholecystic fluid. Findings are concerning for acute cholecystitis. 2. Intra and extrahepatic biliary ductal dilatation without obstructing calculus or other lesion identified to the ampulla. 3. Numerous subtly T2 hyperintense, somewhat ill-defined lesions throughout the liver parenchyma, highly suspicious for hepatic metastases although incompletely characterized by noncontrast MR. 4. Bulky, matted appearing abdominal lymph nodes, consistent with nodal metastatic disease. 5. Cirrhosis and ascites. 6. Partially imaged bulky uterine fibroids.  The patient has refused surgery for her acute cholecystitis.  She reports that her abdominal pain is in the right side and travels from the lower abdomen up to the upper right side. The patient was found back in May to have hepatitis C antibody positive with a negative surface antigen for hepatitis B.   The patient reports that she has been a drinker but is inconsistent with her reporting.  She further stated that she had 4-5 beers a day but then stated that it was only once  a week but she used to drink more heavily in the past.  The patient's MCV has been high for the last few months with her admission MCV at 104 with her MCV today at 107.  Her hemoglobin 2 months ago was 12.8 with her baseline being between 10 and 12.  She denies any black stools or bloody stools.  She also reports that she has refused surgery because she is too old to undergo a cholecystectomy.  The patient was also asked about her diarrhea that was a reason for the consultation but states that she had diarrhea last week but nothing for the last 4 days without having a single bowel movement since Saturday.  I have confirmed with the nurse that she has not had any diarrhea.  Past Medical History:  Diagnosis Date   Aortic atherosclerosis ([place])    Arthritis    Atrial fibrillation ([place])    a.) s/p TEE with cardioversion on 02/04/2020. b.) on daily apixaban.   Bladder mass    a.) CT 04/08/2021 --> 3.2 cm intraluminal bladder mass.   Carotid stenosis, bilateral    a.) Doppler 07/04/2020 --> mild; 1-49% stenosis BILATERALLY.   Cholelithiasis    a.) CT 11/06/2020 --> largest measured 4.5 cm   Chronic anticoagulation    a.) Apixaban   Common biliary duct calculus    a.) CT 04/08/2021 --> CBD dilated at 8 mm; 77m calculus within distal CBD.   Coronary artery disease involving native coronary artery of native heart without angina pectoris 58/14/4818  Diastolic  dysfunction    a.) TTE 07/04/2020 --> LVEF 60-65%; G1DD.   Hepatic cirrhosis (HCC)    Hepatic steatosis    Hepatitis 1970   History of 2019 novel coronavirus disease (COVID-19) 12/20/2019   History of marijuana use    Hypertension    Hypertensive retinopathy    IDA (iron deficiency anemia)    Mitral stenosis    a.) TEE 04/10/2020 --> mild. b.) TTE 07/04/2020 --> EF 60-65%; mild (mean gradient 5 mmHg).   Moderate pulmonary arterial systolic hypertension ([place]) 12/22/2019   Nephrolithiasis    NSTEMI (non-ST elevated myocardial infarction)  ([place]) 07/26/2016   Open-angle glaucoma 09/11/2010   PAH (pulmonary artery hypertension) ([place])    a.) TTE 12/21/2019 --> PASP 44 mmHg.   Uterine fibroid    a.) CT 04/08/2021 --> multiple with largest measuring 7 cm.   Valvular regurgitation    a.) TTE 09/01/2010 --> trivial to mild pan-valvular. b.) TTE 12/21/2019 --> mild MR and AR; moderate TR. c.) TTE 07/04/2020 --> mild TR; trivial MR and PR.   Vestibular neuronitis     Past Surgical History:  Procedure Laterality Date   BREAST CYST ASPIRATION Left    COLONOSCOPY  2012   ERCP N/A 12/18/2021   Procedure: ENDOSCOPIC RETROGRADE CHOLANGIOPANCREATOGRAPHY (ERCP);  Surgeon: Kristina Lame, MD;  Location: [facility] ENDOSCOPY;  Service: Endoscopy;  Laterality: N/A;   TEE WITH CARDIOVERSION N/A 02/04/2020   Procedure: TEE WITH CARDIOVERSION; Location: [facility]; Surgeon: Kandis Cocking, MD   TRANSURETHRAL RESECTION OF BLADDER TUMOR N/A 05/15/2021   Procedure: TRANSURETHRAL RESECTION OF BLADDER TUMOR (TURBT);  Surgeon: Billey Co, MD;  Location: [facility] ORS;  Service: Urology;  Laterality: N/A;    Prior to Admission medications   Medication Sig Start Date End Date Taking? Authorizing Provider  atorvastatin (LIPITOR) 40 MG tablet Take 40 mg by mouth at bedtime. 03/16/22  Yes [provider]  ELIQUIS 5 MG TABS tablet Take 1 tablet (5 mg total) by mouth 2 (two) times daily. 12/22/21 04/11/22 Yes Nolberto Hanlon, MD  magnesium oxide (MAG-OX) 400 MG tablet Take 1 tablet by mouth 2 (two) times daily. 03/24/20  Yes [provider]  metoprolol succinate (TOPROL-XL) 25 MG 24 hr tablet Take 1 tablet (25 mg total) by mouth 2 (two) times daily. Take with or immediately following a meal. 12/22/21 04/11/22 Yes Amery, Gwynneth Albright, MD  amiodarone (PACERONE) 200 MG tablet Take by mouth. 02/02/22 02/02/23  [provider]  amLODipine (NORVASC) 10 MG tablet Take 10 mg by mouth daily. Patient not taking: Reported on 02/11/2022 01/06/22   [provider]  diltiazem  (CARDIZEM CD) 120 MG 24 hr capsule Take 1 capsule (120 mg total) by mouth daily. Patient not taking: Reported on 04/11/2022 12/22/21 12/22/22  Nolberto Hanlon, MD  traMADol Veatrice Bourbon) 50 MG tablet Take One tab PO Q6 hours PRN pain Patient not taking: Reported on 04/11/2022 09/22/21   [provider]  trolamine salicylate (ASPERCREME) 10 % cream Apply 1 application topically as needed for muscle pain.    [provider]    Family History  Problem Relation Age of Onset   Aneurysm Mother    Colon cancer Father    Lung cancer Brother    Breast cancer Neg Hx      Social History   Tobacco Use   Smoking status: Former    Packs/day: 0.10    Years: 10.00    Total pack years: 1.00    Types: Cigarettes    Passive exposure: Past  Smokeless tobacco: Never   Tobacco comments:    occasional smoker  Vaping Use   Vaping Use: Never used  Substance Use Topics   Alcohol use: Yes    Alcohol/week: 5.0 standard drinks of alcohol    Types: 5 Cans of beer per week    Comment: weekly   Drug use: Not Currently    Types: 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, 70's    Allergies as of 04/11/2022 - Review Complete 04/11/2022  Allergen Reaction Noted   Atenolol Other (See Comments) 09/29/2009   Penicillins Rash 02/04/2020    Review of Systems:    All systems reviewed and negative except where noted in HPI.   Physical Exam:  Vital signs in last 24 hours: Temp:  [97.6 F (36.4 C)-98.4 F (36.9 C)] 97.6 F (36.4 C) (09/05 0813) Pulse Rate:  [57-105] 97 (09/05 0813) Resp:  [16-18] 18 (09/05 0813) BP: (122-153)/(72-103) 143/94 (09/05 0813) SpO2:  [95 %-99 %] 97 % (09/05 0813) Weight:  [57.2 kg-57.5 kg] 57.5 kg (09/05 0422) Last BM Date : 04/12/22 General:   Pleasant, cooperative in NAD Head:  Normocephalic and atraumatic. Eyes:   No icterus.   Conjunctiva pink. PERRLA. Ears:  Normal auditory acuity. Neck:  Supple; no masses or thyroidomegaly Lungs: Respirations even and unlabored.  Lungs clear to auscultation bilaterally.   No wheezes, crackles, or rhonchi.  Heart:  Regular rate and rhythm;  Without murmur, clicks, rubs or gallops Abdomen:  Soft, nondistended, positive tenderness with positive Murphy sign. Normal bowel sounds. No appreciable masses or hepatomegaly.   Rectal:  Not performed. Msk:  Symmetrical without gross deformities.    Extremities:  Without edema, cyanosis or clubbing. Neurologic:  Alert and oriented x3;  grossly normal neurologically. Skin:  Intact without significant lesions or rashes. Cervical Nodes:  No significant cervical adenopathy. Psych:  Alert and cooperative. Normal affect.  LAB RESULTS: Recent Labs    04/11/22 1222 04/12/22 0452 04/13/22 0459  WBC 11.3* 10.5 6.4  HGB 12.8 9.5* 9.7*  HCT 38.3 29.3* 29.6*  PLT 180 137* 146*   BMET Recent Labs    04/11/22 1222 04/12/22 0452 04/13/22 0459  NA 134* 138 135  K 2.6* 4.3 4.0  CL 97* 109 108  CO2 23 21* 22  GLUCOSE 96 90 85  BUN 7* 10 14  CREATININE 0.67 0.75 0.72  CALCIUM 8.5* 7.8* 7.8*   LFT Recent Labs    04/11/22 1222 04/12/22 0452 04/13/22 0459  PROT 8.9*   < > 6.7  ALBUMIN 3.1*   < > 2.2*  AST 32   < > 39  ALT 14   < > 15  ALKPHOS 49   < > 39  BILITOT 2.2*   < > 1.1  BILIDIR 0.9*  --   --   IBILI 1.3*  --   --    < > = values in this interval not displayed.   PT/INR No results for input(s): "LABPROT", "INR" in the last 72 hours.  STUDIES: MR 3D Recon At Scanner  Result Date: 04/12/2022 CLINICAL DATA:  Cholelithiasis right upper quadrant pain, cirrhosis, history of bladder cancer EXAM: MRI ABDOMEN WITHOUT CONTRAST  (INCLUDING MRCP) TECHNIQUE: Multiplanar multisequence MR imaging of the abdomen was performed. Heavily T2-weighted images of the biliary and pancreatic ducts were obtained, and three-dimensional MRCP images were rendered by post processing. COMPARISON:  Same-day CT abdomen pelvis FINDINGS: Lower chest:  Cardiomegaly.  Trace pleural effusions.  Hepatobiliary: Coarse, nodular cirrhotic morphology  of the liver. Numerous subtly T2 hyperintense, somewhat ill-defined lesions throughout the liver parenchyma, for example in the posterior liver dome, hepatic segment VII, measuring up to 1.4 x 1.1 cm (series 9, image 6) and in the inferior left lobe of the liver, hepatic segment III, measuring 3.2 x 2.4 cm (series 9, image 19). Sludge and gallstones in the gallbladder, including a large gallstone measuring at least 4.8 cm and multiple small gallstones near the gallbladder neck (series 4, image 15). Gallbladder wall thickening and pericholecystic fluid (series 4, image 20). Intra and extrahepatic biliary ductal dilatation without obstructing calculus identified to the ampulla, common bile duct measuring up to 1.0 cm in caliber (series 3, image 15). Pancreas: Unremarkable. No pancreatic ductal dilatation or surrounding inflammatory changes. Spleen: Normal in size without significant abnormality. Adrenals/Urinary Tract: Adrenal glands are unremarkable. Kidneys are normal, without renal calculi, solid lesion, or hydronephrosis. Stomach/Bowel: Stomach is within normal limits. No evidence of bowel wall thickening, distention, or inflammatory changes. Vascular/Lymphatic: No significant vascular findings are present. Bulky, matted appearing gastrohepatic ligament, porta hepatis, and retroperitoneal lymph nodes, largest porta hepatis node measuring 2.9 x 1.9 cm (series 9, image 15) Other: No abdominal wall hernia. Anasarca. Small volume ascites. Partially imaged bulky uterine fibroids (series 3, image 13). Musculoskeletal: No acute or significant osseous findings. IMPRESSION: 1. Sludge and gallstones in the gallbladder, including a large gallstone measuring at least 4.8 cm and multiple small gallstones near the gallbladder neck. Gallbladder wall thickening and pericholecystic fluid. Findings are concerning for acute cholecystitis. 2. Intra and extrahepatic biliary ductal  dilatation without obstructing calculus or other lesion identified to the ampulla. 3. Numerous subtly T2 hyperintense, somewhat ill-defined lesions throughout the liver parenchyma, highly suspicious for hepatic metastases although incompletely characterized by noncontrast MR. 4. Bulky, matted appearing abdominal lymph nodes, consistent with nodal metastatic disease. 5. Cirrhosis and ascites. 6. Partially imaged bulky uterine fibroids. Electronically Signed   By: Delanna Ahmadi M.D.   On: 04/12/2022 05:55   MR ABDOMEN MRCP WO CONTRAST  Result Date: 04/11/2022 CLINICAL DATA:  Cholelithiasis right upper quadrant pain, cirrhosis, history of bladder cancer EXAM: MRI ABDOMEN WITHOUT CONTRAST  (INCLUDING MRCP) TECHNIQUE: Multiplanar multisequence MR imaging of the abdomen was performed. Heavily T2-weighted images of the biliary and pancreatic ducts were obtained, and three-dimensional MRCP images were rendered by post processing. COMPARISON:  Same-day CT abdomen pelvis FINDINGS: Lower chest:  Cardiomegaly.  Trace pleural effusions. Hepatobiliary: Coarse, nodular cirrhotic morphology of the liver. Numerous subtly T2 hyperintense, somewhat ill-defined lesions throughout the liver parenchyma, for example in the posterior liver dome, hepatic segment VII, measuring up to 1.4 x 1.1 cm (series 9, image 6) and in the inferior left lobe of the liver, hepatic segment III, measuring 3.2 x 2.4 cm (series 9, image 19). Sludge and gallstones in the gallbladder, including a large gallstone measuring at least 4.8 cm and multiple small gallstones near the gallbladder neck (series 4, image 15). Gallbladder wall thickening and pericholecystic fluid (series 4, image 20). Intra and extrahepatic biliary ductal dilatation without obstructing calculus identified to the ampulla, common bile duct measuring up to 1.0 cm in caliber (series 3, image 15). Pancreas: Unremarkable. No pancreatic ductal dilatation or surrounding inflammatory changes.  Spleen: Normal in size without significant abnormality. Adrenals/Urinary Tract: Adrenal glands are unremarkable. Kidneys are normal, without renal calculi, solid lesion, or hydronephrosis. Stomach/Bowel: Stomach is within normal limits. No evidence of bowel wall thickening, distention, or inflammatory changes. Vascular/Lymphatic: No significant vascular findings are present. Bulky, matted  appearing gastrohepatic ligament, porta hepatis, and retroperitoneal lymph nodes, largest porta hepatis node measuring 2.9 x 1.9 cm (series 9, image 15) Other: No abdominal wall hernia. Anasarca. Small volume ascites. Partially imaged bulky uterine fibroids (series 3, image 13). Musculoskeletal: No acute or significant osseous findings. IMPRESSION: 1. Sludge and gallstones in the gallbladder, including a large gallstone measuring at least 4.8 cm and multiple small gallstones near the gallbladder neck. Gallbladder wall thickening and pericholecystic fluid. Findings are concerning for acute cholecystitis. 2. Intra and extrahepatic biliary ductal dilatation without obstructing calculus or other lesion identified to the ampulla. 3. Numerous subtly T2 hyperintense, somewhat ill-defined lesions throughout the liver parenchyma, highly suspicious for hepatic metastases although incompletely characterized by noncontrast MR. 4. Bulky, matted appearing abdominal lymph nodes, consistent with nodal metastatic disease. 5. Cirrhosis and ascites. 6. Partially imaged bulky uterine fibroids. Electronically Signed   By: Delanna Ahmadi M.D.   On: 04/11/2022 21:14   CT ABDOMEN PELVIS W CONTRAST  Result Date: 04/11/2022 CLINICAL DATA:  Pain right upper quadrant, diarrhea EXAM: CT ABDOMEN AND PELVIS WITH CONTRAST TECHNIQUE: Multidetector CT imaging of the abdomen and pelvis was performed using the standard protocol following bolus administration of intravenous contrast. RADIATION DOSE REDUCTION: This exam was performed according to the departmental  dose-optimization program which includes automated exposure control, adjustment of the mA and/or kV according to patient size and/or use of iterative reconstruction technique. CONTRAST:  176m OMNIPAQUE IOHEXOL 300 MG/ML  SOLN COMPARISON:  Previous studies including CT done on 12/20/2021 and sonogram done on 04/11/2022 FINDINGS: Lower chest: No focal infiltrates are seen in visualized lower lung fields. Heart is enlarged in size. Dense calcification is seen in mitral annulus. Scattered coronary artery calcifications are seen. Hepatobiliary: There is fatty infiltration in liver. In image 12 of series 3, there is 1.3 cm inhomogeneous enhancing lesion in the right lobe. There are other smaller enhancing lesions in the liver. There is nodularity of the liver surface suggesting possible cirrhosis. There is no dilation of bile ducts. Nausea gallbladder stones are seen. There is no significant wall thickening in gallbladder. There is fluid around the gallbladder which may be part of ascites. Pancreas: There is atrophy. There is dilation of pancreatic duct. No focal abnormalities are seen. Spleen: Unremarkable. Adrenals/Urinary Tract: Adrenals are unremarkable. There is no hydronephrosis. There are no renal or ureteral stones. The 7 mm low-density lesion in the lower pole of left kidney, possibly a cyst. Urinary bladder is unremarkable. Stomach/Bowel: Stomach is not distended. Small bowel loops are not dilated. The appendix is not dilated. There is no significant focal wall thickening in colon. There is no pericolic stranding. Vascular/Lymphatic: Extensive arterial calcifications are seen. There are abnormal enlarged lymph nodes in retroperitoneum measuring up to 2.2 cm in diameter. There is interval increase in number and size of retroperitoneal lymph nodes suggesting progression of metastatic disease. There is a new large lymph node measuring 3.4 x 2.2 cm in size slightly above the head of the pancreas close to support a  bodies suggesting metastatic disease. Reproductive: Uterus is enlarged with multiple lesions of varying sizes suggesting uterine fibroids. Largest of the fibroids measures 7.5 cm in maximum diameter. There are no adnexal masses. Minimal amount of free fluid is seen in pelvis. Other: There is no pneumoperitoneum. Small umbilical hernia containing fat is seen. Musculoskeletal: Degenerative changes are noted in lumbar spine with disc space narrowing, bony spurs and encroachment of neural foramina at multiple levels. IMPRESSION: There is no evidence of intestinal obstruction  or pneumoperitoneum. There is no hydronephrosis. There is interval appearance of multiple small enhancing lesions in the liver suggesting possible metastatic disease. Cirrhosis of liver. There are enlarged lymph nodes in retroperitoneum and adjacent to porta hepatis with interval increase in size and number suggesting progression of metastatic lymphadenopathy. Large gallbladder stones. Small amount of fluid around the gallbladder is possibly small ascites. Less likely possibility would be acute cholecystitis. There is no wall thickening in gallbladder. There is no significant dilation of bile ducts. Multiple uterine fibroids. Minimal ascites. Other findings as described in the body of the report. Electronically Signed   By: Elmer Picker M.D.   On: 04/11/2022 14:54   US Abdomen Limited RUQ (LIVER/GB)  Result Date: 04/11/2022 CLINICAL DATA:  Right upper quadrant pain. EXAM: ULTRASOUND ABDOMEN LIMITED RIGHT UPPER QUADRANT COMPARISON:  CT, 12/20/2021. FINDINGS: Gallbladder: Gallbladder is distended. There are large shadowing stones measuring up to 5 cm. Wall is thickened to 5 mm, nonspecific in the setting of ascites. Common bile duct: Diameter: What appears to be the common bile duct measures 9 mm. Liver: Coarsened, heterogeneous echotexture. Surface nodularity. Heterogeneous hypoechoic lesion in the left lobe, that appears cystic, 2.5 x 1.8  x 1.5 cm. Oval hypoechoic lesion which may arise from the posteroinferior left lobe or be posterior to the liver, 3.5 x 1.8 x 2.5 cm. Additional small hypoechoic lesion in the left lobe measuring 8 mm. These lesions not evident on the prior CT. Portal vein is patent on color Doppler imaging with normal direction of blood flow towards the liver. Other: Ascites. IMPRESSION: 1. Findings consistent with cirrhosis as noted on the prior CT. There are hypo to anechoic liver lesions that were not evident on the prior CT. This makes cysts unlikely, and these may reflect hypoechoic solid lesions. 2. Multiple gallstones. Gallbladder wall is thickened, but this is a nonspecific finding in the setting of ascites. Cannot exclude or confirm acute cholecystitis. 3. Hypoechoic lesion that is likely posterior to the left liver lobe. This is suspected to be an enlarged porta hepatis lymph node. 4. Recommend follow-up imaging. For clinical suspicion of acute cholecystitis, recommend HIDA scan. For the liver findings, recommend follow-up liver MRI with and without contrast to assess for possible hepatocellular carcinoma. Electronically Signed   By: Lajean Manes M.D.   On: 04/11/2022 14:08   DG Chest Port 1 View  Result Date: 04/11/2022 CLINICAL DATA:  Chest pain and dyspnea on exertion EXAM: PORTABLE CHEST 1 VIEW COMPARISON:  Radiograph 08/09/2021 FINDINGS: Unchanged cardiomegaly. Heavy mitral annular calcifications. Aortic arch calcifications. There is no focal airspace consolidation. There is no pleural effusion. No pneumothorax. There is severe bilateral glenohumeral osteoarthritis with bony remodeling on the right. Calcified gallstone noted in the right upper quadrant as seen on prior CT. IMPRESSION: Unchanged cardiomegaly with extensive mitral annular calcifications. No focal airspace disease or overt pulmonary edema. Electronically Signed   By: Maurine Simmering M.D.   On: 04/11/2022 13:02      Impression / Plan:    Assessment: Principal Problem:   Chest pain Active Problems:   Bladder cancer (HCC)   Hypokalemia   Acute cholecystitis   Atrial fibrillation with rapid ventricular response (HCC)   Hypomagnesemia   Alcohol use   Diarrhea   Kristina Ellis is a 71 y.o. 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  The patient has been found to have acute cholecystitis  but has refused surgery and is being treated with antibiotics.   As for the diarrhea the patient has not had a bowel movement since Saturday and states that time it was solid.  The patient's last diarrhea was over a week ago.  Plan:  An oncology consult should be strongly considered.  I will have the patient's alpha-fetoprotein sent off as well as a CEA.  As for her abdominal pain this is consistent with her acute cholecystitis and since she has refused surgery antibiotics are the only option at this point.  The patient's diarrhea has resolved.  Her hemoglobin is slightly up today from yesterday.  I do not think she needs an urgent EGD or any GI intervention unless she has any signs of bleeding or further drop in her hemoglobin.  Her anemia is likely multifactorial with her MCV being elevated.  The patient has been explained the plan and agrees with it.  Thank you for involving me in the care of this patient.      LOS: 2 days   Kristina Lame, MD, [facility] 04/13/2022, 11:05 AM,  Pager [phone] 7am-5pm  Check AMION for 5pm -7am coverage and on weekends   Note: This dictation was prepared with Dragon dictation along with smaller phrase technology. Any transcriptional errors that result from this process are unintentional.

## 2022-04-13 NOTE — Progress Notes (Addendum)
[place] NOTE       Patient ID: Kristina Ellis MRN: [id number] DOB/AGE: 08-29-1950 71 y.o.  Admit date: 04/11/2022 Referring Physician Dr. Florina Ou Primary Physician [facility]  Primary Cardiologist Dr. Clayborn Bigness Reason for Consultation AF RVR, chest pain   HPI: Kristina Ellis is a 71yoF with a PMH of paroxysmal atrial fibrillation s/p TEE/DCCV 01/2020, bladder cancer s/p chemo (completed 07/2021) and XRT  (completed 08/2021), HFpEF (60-65% 08/2021), mild mitral stenosis, mild bilateral carotid artery stenosis, moderate pulmonary hypertension, liver cirrhosis, cholelithiasis who presented to [facility] ED 04/11/2022 with abdominal pain with associated N/V/D with abdominal MRI with findings concerning for acute cholecystitis. She was also in afib with RVR in the setting of alcohol use on admission for which cardiology is consulted for further assistance.  Interval history: -No acute events -Reports her belly hurts when she moves around, no pain at rest -Tolerated breakfast without nausea, vomiting, or diarrhea -No chest pain, shortness of breath -In atrial flutter with heart rates in the 90s to low 100s on telemetry  Review of systems complete and found to be negative unless listed above     Past Medical History:  Diagnosis Date   Aortic atherosclerosis (HCC)    Arthritis    Atrial fibrillation ([place])    a.) s/p TEE with cardioversion on 02/04/2020. b.) on daily apixaban.   Bladder mass    a.) CT 04/08/2021 --> 3.2 cm intraluminal bladder mass.   Carotid stenosis, bilateral    a.) Doppler 07/04/2020 --> mild; 1-49% stenosis BILATERALLY.   Cholelithiasis    a.) CT 11/06/2020 --> largest measured 4.5 cm   Chronic anticoagulation    a.) Apixaban   Common biliary duct calculus    a.) CT 04/08/2021 --> CBD dilated at 8 mm; 55m calculus within distal CBD.   Coronary artery disease involving native coronary artery of native heart without angina  pectoris 51/74/9449  Diastolic dysfunction    a.) TTE 07/04/2020 --> LVEF 60-65%; G1DD.   Hepatic cirrhosis (HCC)    Hepatic steatosis    Hepatitis 1970   History of 2019 novel coronavirus disease (COVID-19) 12/20/2019   History of marijuana use    Hypertension    Hypertensive retinopathy    IDA (iron deficiency anemia)    Mitral stenosis    a.) TEE 04/10/2020 --> mild. b.) TTE 07/04/2020 --> EF 60-65%; mild (mean gradient 5 mmHg).   Moderate pulmonary arterial systolic hypertension ([place] 12/22/2019   Nephrolithiasis    [place]STEMI (non-ST elevated myocardial infarction) [place] 07/26/2016   Open-angle glaucoma 09/11/2010   PAH (pulmonary artery hypertension) ([place]    a.) TTE 12/21/2019 --> PASP 44 mmHg.   Uterine fibroid    a.) CT 04/08/2021 --> multiple with largest measuring 7 cm.   Valvular regurgitation    a.) TTE 09/01/2010 --> trivial to mild pan-valvular. b.) TTE 12/21/2019 --> mild MR and AR; moderate TR. c.) TTE 07/04/2020 --> mild TR; trivial MR and PR.   Vestibular neuronitis     Past Surgical History:  Procedure Laterality Date   BREAST CYST ASPIRATION Left    COLONOSCOPY  2012   ERCP N/A 12/18/2021   Procedure: ENDOSCOPIC RETROGRADE CHOLANGIOPANCREATOGRAPHY (ERCP);  Surgeon: WLucilla Lame MD;  [facility];  Service: Endoscopy;  Laterality: N/A;   TEE WITH CARDIOVERSION N/A 02/04/2020   Procedure: TEE WITH CARDIOVERSION; Location: [facility]; Surgeon: CKandis Cocking MD   TRANSURETHRAL RESECTION OF BLADDER TUMOR N/A 05/15/2021   Procedure: TRANSURETHRAL RESECTION OF  BLADDER TUMOR (TURBT);  Surgeon: Billey Co, MD;  Location: [facility] ORS;  Service: Urology;  Laterality: N/A;    Medications Prior to Admission  Medication Sig Dispense Refill Last Dose   atorvastatin (LIPITOR) 40 MG tablet Take 40 mg by mouth at bedtime.   04/10/2022   ELIQUIS 5 MG TABS tablet Take 1 tablet (5 mg total) by mouth 2 (two) times daily. 60 tablet 0 04/10/2022   magnesium oxide (MAG-OX) 400 MG tablet  Take 1 tablet by mouth 2 (two) times daily.   04/10/2022   metoprolol succinate (TOPROL-XL) 25 MG 24 hr tablet Take 1 tablet (25 mg total) by mouth 2 (two) times daily. Take with or immediately following a meal. 60 tablet 0 04/10/2022   amiodarone (PACERONE) 200 MG tablet Take by mouth.   04/09/2022   amLODipine (NORVASC) 10 MG tablet Take 10 mg by mouth daily. (Patient not taking: Reported on 02/11/2022)   Not Taking   diltiazem (CARDIZEM CD) 120 MG 24 hr capsule Take 1 capsule (120 mg total) by mouth daily. (Patient not taking: Reported on 04/11/2022) 30 capsule 0 Not Taking   traMADol (ULTRAM) 50 MG tablet Take One tab PO Q6 hours PRN pain (Patient not taking: Reported on 04/11/2022)   Not Taking   trolamine salicylate (ASPERCREME) 10 % cream Apply 1 application topically as needed for muscle pain.   PRN at PRN   Social History   Socioeconomic History   Marital status: Single    Spouse name: Not on file   Number of children: 1   Years of education: Not on file   Highest education level: Not on file  Occupational History   Not on file  Tobacco Use   Smoking status: Former    Packs/day: 0.10    Years: 10.00    Total pack years: 1.00    Types: Cigarettes    Passive exposure: Past   Smokeless tobacco: Never   Tobacco comments:    occasional smoker  Vaping Use   Vaping Use: Never used  Substance and Sexual Activity   Alcohol use: Yes    Alcohol/week: 5.0 standard drinks of alcohol    Types: 5 Cans of beer per week    Comment: weekly   Drug use: Not Currently    Types: 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, 70's   Sexual activity: Not on file  Other Topics Concern   Not on file  Social History Narrative   Lives with son   Social Determinants of Health   Financial Resource Strain: Not on file  Food Insecurity: Not on file  Transportation Needs: Not on file  Physical Activity: Not on file  Stress: Not on file  Social Connections: Not on file  Intimate Partner Violence: Not on file     Family History  Problem Relation Age of Onset   Aneurysm Mother    Colon cancer Father    Lung cancer Brother    Breast cancer Neg Hx       PHYSICAL EXAM General: Pleasant black female, well nourished, in no acute distress.  Laying at incline in hospital bed. HEENT:  Normocephalic and atraumatic. Neck:  No JVD.  Lungs: Normal respiratory effort on room air.  Decreased breath sounds bilaterally without appreciable crackles or wheezes.   Heart: Irregularly irregular rhythm with controlled rate. Normal S1 and S2 without gallops or murmurs.  Abdomen: Non-distended appearing.  Msk: Normal strength and tone for age. Extremities: Warm and well perfused. No  clubbing, cyanosis.  No peripheral edema.  Neuro: Alert and oriented X 3. Psych:  Answers questions appropriately.   Labs: Basic Metabolic Panel: Recent Labs    04/12/22 0452 04/13/22 0459  NA 138 135  K 4.3 4.0  CL 109 108  CO2 21* 22  GLUCOSE 90 85  BUN 10 14  CREATININE 0.75 0.72  CALCIUM 7.8* 7.8*  MG 2.0 1.9  PHOS  --  2.1*   Liver Function Tests: Recent Labs    04/12/22 0452 04/13/22 0459  AST 24 39  ALT 12 15  ALKPHOS 37* 39  BILITOT 2.2* 1.1  PROT 6.7 6.7  ALBUMIN 2.3* 2.2*   Recent Labs    04/11/22 1222  LIPASE 27   CBC: Recent Labs    04/11/22 1222 04/12/22 0452 04/13/22 0459  WBC 11.3* 10.5 6.4  NEUTROABS 9.8*  --   --   HGB 12.8 9.5* 9.7*  HCT 38.3 29.3* 29.6*  MCV 104.1* 108.1* 107.6*  PLT 180 137* 146*   Cardiac Enzymes: Recent Labs    04/11/22 1222 04/11/22 1406  TROPONINIHS 17 13   BNP: Invalid input(s): "POCBNP" D-Dimer: No results for input(s): "DDIMER" in the last 72 hours. Hemoglobin A1C: No results for input(s): "HGBA1C" in the last 72 hours. Fasting Lipid Panel: No results for input(s): "CHOL", "HDL", "LDLCALC", "TRIG", "CHOLHDL", "LDLDIRECT" in the last 72 hours. Thyroid Function Tests: No results for input(s): "TSH", "T4TOTAL", "T3FREE", "THYROIDAB" in the last  72 hours.  Invalid input(s): "FREET3" Anemia Panel: No results for input(s): "VITAMINB12", "FOLATE", "FERRITIN", "TIBC", "IRON", "RETICCTPCT" in the last 72 hours.  MR 3D Recon At Scanner  Result Date: 04/12/2022 CLINICAL DATA:  Cholelithiasis right upper quadrant pain, cirrhosis, history of bladder cancer EXAM: MRI ABDOMEN WITHOUT CONTRAST  (INCLUDING MRCP) TECHNIQUE: Multiplanar multisequence MR imaging of the abdomen was performed. Heavily T2-weighted images of the biliary and pancreatic ducts were obtained, and three-dimensional MRCP images were rendered by post processing. COMPARISON:  Same-day CT abdomen pelvis FINDINGS: Lower chest:  Cardiomegaly.  Trace pleural effusions. Hepatobiliary: Coarse, nodular cirrhotic morphology of the liver. Numerous subtly T2 hyperintense, somewhat ill-defined lesions throughout the liver parenchyma, for example in the posterior liver dome, hepatic segment VII, measuring up to 1.4 x 1.1 cm (series 9, image 6) and in the inferior left lobe of the liver, hepatic segment III, measuring 3.2 x 2.4 cm (series 9, image 19). Sludge and gallstones in the gallbladder, including a large gallstone measuring at least 4.8 cm and multiple small gallstones near the gallbladder neck (series 4, image 15). Gallbladder wall thickening and pericholecystic fluid (series 4, image 20). Intra and extrahepatic biliary ductal dilatation without obstructing calculus identified to the ampulla, common bile duct measuring up to 1.0 cm in caliber (series 3, image 15). Pancreas: Unremarkable. No pancreatic ductal dilatation or surrounding inflammatory changes. Spleen: Normal in size without significant abnormality. Adrenals/Urinary Tract: Adrenal glands are unremarkable. Kidneys are normal, without renal calculi, solid lesion, or hydronephrosis. Stomach/Bowel: Stomach is within normal limits. No evidence of bowel wall thickening, distention, or inflammatory changes. Vascular/Lymphatic: No significant  vascular findings are present. Bulky, matted appearing gastrohepatic ligament, porta hepatis, and retroperitoneal lymph nodes, largest porta hepatis node measuring 2.9 x 1.9 cm (series 9, image 15) Other: No abdominal wall hernia. Anasarca. Small volume ascites. Partially imaged bulky uterine fibroids (series 3, image 13). Musculoskeletal: No acute or significant osseous findings. IMPRESSION: 1. Sludge and gallstones in the gallbladder, including a large gallstone measuring at least 4.8 cm and  multiple small gallstones near the gallbladder neck. Gallbladder wall thickening and pericholecystic fluid. Findings are concerning for acute cholecystitis. 2. Intra and extrahepatic biliary ductal dilatation without obstructing calculus or other lesion identified to the ampulla. 3. Numerous subtly T2 hyperintense, somewhat ill-defined lesions throughout the liver parenchyma, highly suspicious for hepatic metastases although incompletely characterized by noncontrast MR. 4. Bulky, matted appearing abdominal lymph nodes, consistent with nodal metastatic disease. 5. Cirrhosis and ascites. 6. Partially imaged bulky uterine fibroids. Electronically Signed   By: Delanna Ahmadi M.D.   On: 04/12/2022 05:55   MR ABDOMEN MRCP WO CONTRAST  Result Date: 04/11/2022 CLINICAL DATA:  Cholelithiasis right upper quadrant pain, cirrhosis, history of bladder cancer EXAM: MRI ABDOMEN WITHOUT CONTRAST  (INCLUDING MRCP) TECHNIQUE: Multiplanar multisequence MR imaging of the abdomen was performed. Heavily T2-weighted images of the biliary and pancreatic ducts were obtained, and three-dimensional MRCP images were rendered by post processing. COMPARISON:  Same-day CT abdomen pelvis FINDINGS: Lower chest:  Cardiomegaly.  Trace pleural effusions. Hepatobiliary: Coarse, nodular cirrhotic morphology of the liver. Numerous subtly T2 hyperintense, somewhat ill-defined lesions throughout the liver parenchyma, for example in the posterior liver dome, hepatic  segment VII, measuring up to 1.4 x 1.1 cm (series 9, image 6) and in the inferior left lobe of the liver, hepatic segment III, measuring 3.2 x 2.4 cm (series 9, image 19). Sludge and gallstones in the gallbladder, including a large gallstone measuring at least 4.8 cm and multiple small gallstones near the gallbladder neck (series 4, image 15). Gallbladder wall thickening and pericholecystic fluid (series 4, image 20). Intra and extrahepatic biliary ductal dilatation without obstructing calculus identified to the ampulla, common bile duct measuring up to 1.0 cm in caliber (series 3, image 15). Pancreas: Unremarkable. No pancreatic ductal dilatation or surrounding inflammatory changes. Spleen: Normal in size without significant abnormality. Adrenals/Urinary Tract: Adrenal glands are unremarkable. Kidneys are normal, without renal calculi, solid lesion, or hydronephrosis. Stomach/Bowel: Stomach is within normal limits. No evidence of bowel wall thickening, distention, or inflammatory changes. Vascular/Lymphatic: No significant vascular findings are present. Bulky, matted appearing gastrohepatic ligament, porta hepatis, and retroperitoneal lymph nodes, largest porta hepatis node measuring 2.9 x 1.9 cm (series 9, image 15) Other: No abdominal wall hernia. Anasarca. Small volume ascites. Partially imaged bulky uterine fibroids (series 3, image 13). Musculoskeletal: No acute or significant osseous findings. IMPRESSION: 1. Sludge and gallstones in the gallbladder, including a large gallstone measuring at least 4.8 cm and multiple small gallstones near the gallbladder neck. Gallbladder wall thickening and pericholecystic fluid. Findings are concerning for acute cholecystitis. 2. Intra and extrahepatic biliary ductal dilatation without obstructing calculus or other lesion identified to the ampulla. 3. Numerous subtly T2 hyperintense, somewhat ill-defined lesions throughout the liver parenchyma, highly suspicious for hepatic  metastases although incompletely characterized by noncontrast MR. 4. Bulky, matted appearing abdominal lymph nodes, consistent with nodal metastatic disease. 5. Cirrhosis and ascites. 6. Partially imaged bulky uterine fibroids. Electronically Signed   By: Delanna Ahmadi M.D.   On: 04/11/2022 21:14   CT ABDOMEN PELVIS W CONTRAST  Result Date: 04/11/2022 CLINICAL DATA:  Pain right upper quadrant, diarrhea EXAM: CT ABDOMEN AND PELVIS WITH CONTRAST TECHNIQUE: Multidetector CT imaging of the abdomen and pelvis was performed using the standard protocol following bolus administration of intravenous contrast. RADIATION DOSE REDUCTION: This exam was performed according to the departmental dose-optimization program which includes automated exposure control, adjustment of the mA and/or kV according to patient size and/or use of iterative reconstruction technique. CONTRAST:  175m OMNIPAQUE IOHEXOL 300 MG/ML  SOLN COMPARISON:  Previous studies including CT done on 12/20/2021 and sonogram done on 04/11/2022 FINDINGS: Lower chest: No focal infiltrates are seen in visualized lower lung fields. Heart is enlarged in size. Dense calcification is seen in mitral annulus. Scattered coronary artery calcifications are seen. Hepatobiliary: There is fatty infiltration in liver. In image 12 of series 3, there is 1.3 cm inhomogeneous enhancing lesion in the right lobe. There are other smaller enhancing lesions in the liver. There is nodularity of the liver surface suggesting possible cirrhosis. There is no dilation of bile ducts. Nausea gallbladder stones are seen. There is no significant wall thickening in gallbladder. There is fluid around the gallbladder which may be part of ascites. Pancreas: There is atrophy. There is dilation of pancreatic duct. No focal abnormalities are seen. Spleen: Unremarkable. Adrenals/Urinary Tract: Adrenals are unremarkable. There is no hydronephrosis. There are no renal or ureteral stones. The 7 mm  low-density lesion in the lower pole of left kidney, possibly a cyst. Urinary bladder is unremarkable. Stomach/Bowel: Stomach is not distended. Small bowel loops are not dilated. The appendix is not dilated. There is no significant focal wall thickening in colon. There is no pericolic stranding. Vascular/Lymphatic: Extensive arterial calcifications are seen. There are abnormal enlarged lymph nodes in retroperitoneum measuring up to 2.2 cm in diameter. There is interval increase in number and size of retroperitoneal lymph nodes suggesting progression of metastatic disease. There is a new large lymph node measuring 3.4 x 2.2 cm in size slightly above the head of the pancreas close to support a bodies suggesting metastatic disease. Reproductive: Uterus is enlarged with multiple lesions of varying sizes suggesting uterine fibroids. Largest of the fibroids measures 7.5 cm in maximum diameter. There are no adnexal masses. Minimal amount of free fluid is seen in pelvis. Other: There is no pneumoperitoneum. Small umbilical hernia containing fat is seen. Musculoskeletal: Degenerative changes are noted in lumbar spine with disc space narrowing, bony spurs and encroachment of neural foramina at multiple levels. IMPRESSION: There is no evidence of intestinal obstruction or pneumoperitoneum. There is no hydronephrosis. There is interval appearance of multiple small enhancing lesions in the liver suggesting possible metastatic disease. Cirrhosis of liver. There are enlarged lymph nodes in retroperitoneum and adjacent to porta hepatis with interval increase in size and number suggesting progression of metastatic lymphadenopathy. Large gallbladder stones. Small amount of fluid around the gallbladder is possibly small ascites. Less likely possibility would be acute cholecystitis. There is no wall thickening in gallbladder. There is no significant dilation of bile ducts. Multiple uterine fibroids. Minimal ascites. Other findings as  described in the body of the report. Electronically Signed   By: PElmer PickerM.D.   On: 04/11/2022 14:54   [place]domen Limited RUQ (LIVER/GB)  Result Date: 04/11/2022 CLINICAL DATA:  Right upper quadrant pain. EXAM: ULTRASOUND ABDOMEN LIMITED RIGHT UPPER QUADRANT COMPARISON:  CT, 12/20/2021. FINDINGS: Gallbladder: Gallbladder is distended. There are large shadowing stones measuring up to 5 cm. Wall is thickened to 5 mm, nonspecific in the setting of ascites. Common bile duct: Diameter: What appears to be the common bile duct measures 9 mm. Liver: Coarsened, heterogeneous echotexture. Surface nodularity. Heterogeneous hypoechoic lesion in the left lobe, that appears cystic, 2.5 x 1.8 x 1.5 cm. Oval hypoechoic lesion which may arise from the posteroinferior left lobe or be posterior to the liver, 3.5 x 1.8 x 2.5 cm. Additional small hypoechoic lesion in the left lobe measuring 8 mm. These lesions  not evident on the prior CT. Portal vein is patent on color Doppler imaging with normal direction of blood flow towards the liver. Other: Ascites. IMPRESSION: 1. Findings consistent with cirrhosis as noted on the prior CT. There are hypo to anechoic liver lesions that were not evident on the prior CT. This makes cysts unlikely, and these may reflect hypoechoic solid lesions. 2. Multiple gallstones. Gallbladder wall is thickened, but this is a nonspecific finding in the setting of ascites. Cannot exclude or confirm acute cholecystitis. 3. Hypoechoic lesion that is likely posterior to the left liver lobe. This is suspected to be an enlarged porta hepatis lymph node. 4. Recommend follow-up imaging. For clinical suspicion of acute cholecystitis, recommend HIDA scan. For the liver findings, recommend follow-up liver MRI with and without contrast to assess for possible hepatocellular carcinoma. Electronically Signed   By: Lajean Manes M.D.   On: 04/11/2022 14:08   DG Chest Port 1 View  Result Date: 04/11/2022 CLINICAL  DATA:  Chest pain and dyspnea on exertion EXAM: PORTABLE CHEST 1 VIEW COMPARISON:  Radiograph 08/09/2021 FINDINGS: Unchanged cardiomegaly. Heavy mitral annular calcifications. Aortic arch calcifications. There is no focal airspace consolidation. There is no pleural effusion. No pneumothorax. There is severe bilateral glenohumeral osteoarthritis with bony remodeling on the right. Calcified gallstone noted in the right upper quadrant as seen on prior CT. IMPRESSION: Unchanged cardiomegaly with extensive mitral annular calcifications. No focal airspace disease or overt pulmonary edema. Electronically Signed   By: Maurine Simmering M.D.   On: 04/11/2022 13:02     Radiology: MR 3D Recon At Scanner  Result Date: 04/12/2022 CLINICAL DATA:  Cholelithiasis right upper quadrant pain, cirrhosis, history of bladder cancer EXAM: MRI ABDOMEN WITHOUT CONTRAST  (INCLUDING MRCP) TECHNIQUE: Multiplanar multisequence MR imaging of the abdomen was performed. Heavily T2-weighted images of the biliary and pancreatic ducts were obtained, and three-dimensional MRCP images were rendered by post processing. COMPARISON:  Same-day CT abdomen pelvis FINDINGS: Lower chest:  Cardiomegaly.  Trace pleural effusions. Hepatobiliary: Coarse, nodular cirrhotic morphology of the liver. Numerous subtly T2 hyperintense, somewhat ill-defined lesions throughout the liver parenchyma, for example in the posterior liver dome, hepatic segment VII, measuring up to 1.4 x 1.1 cm (series 9, image 6) and in the inferior left lobe of the liver, hepatic segment III, measuring 3.2 x 2.4 cm (series 9, image 19). Sludge and gallstones in the gallbladder, including a large gallstone measuring at least 4.8 cm and multiple small gallstones near the gallbladder neck (series 4, image 15). Gallbladder wall thickening and pericholecystic fluid (series 4, image 20). Intra and extrahepatic biliary ductal dilatation without obstructing calculus identified to the ampulla, common bile  duct measuring up to 1.0 cm in caliber (series 3, image 15). Pancreas: Unremarkable. No pancreatic ductal dilatation or surrounding inflammatory changes. Spleen: Normal in size without significant abnormality. Adrenals/Urinary Tract: Adrenal glands are unremarkable. Kidneys are normal, without renal calculi, solid lesion, or hydronephrosis. Stomach/Bowel: Stomach is within normal limits. No evidence of bowel wall thickening, distention, or inflammatory changes. Vascular/Lymphatic: No significant vascular findings are present. Bulky, matted appearing gastrohepatic ligament, porta hepatis, and retroperitoneal lymph nodes, largest porta hepatis node measuring 2.9 x 1.9 cm (series 9, image 15) Other: No abdominal wall hernia. Anasarca. Small volume ascites. Partially imaged bulky uterine fibroids (series 3, image 13). Musculoskeletal: No acute or significant osseous findings. IMPRESSION: 1. Sludge and gallstones in the gallbladder, including a large gallstone measuring at least 4.8 cm and multiple small gallstones near the gallbladder neck. Gallbladder  wall thickening and pericholecystic fluid. Findings are concerning for acute cholecystitis. 2. Intra and extrahepatic biliary ductal dilatation without obstructing calculus or other lesion identified to the ampulla. 3. Numerous subtly T2 hyperintense, somewhat ill-defined lesions throughout the liver parenchyma, highly suspicious for hepatic metastases although incompletely characterized by noncontrast MR. 4. Bulky, matted appearing abdominal lymph nodes, consistent with nodal metastatic disease. 5. Cirrhosis and ascites. 6. Partially imaged bulky uterine fibroids. Electronically Signed   By: Delanna Ahmadi M.D.   On: 04/12/2022 05:55   MR ABDOMEN MRCP WO CONTRAST  Result Date: 04/11/2022 CLINICAL DATA:  Cholelithiasis right upper quadrant pain, cirrhosis, history of bladder cancer EXAM: MRI ABDOMEN WITHOUT CONTRAST  (INCLUDING MRCP) TECHNIQUE: Multiplanar multisequence  MR imaging of the abdomen was performed. Heavily T2-weighted images of the biliary and pancreatic ducts were obtained, and three-dimensional MRCP images were rendered by post processing. COMPARISON:  Same-day CT abdomen pelvis FINDINGS: Lower chest:  Cardiomegaly.  Trace pleural effusions. Hepatobiliary: Coarse, nodular cirrhotic morphology of the liver. Numerous subtly T2 hyperintense, somewhat ill-defined lesions throughout the liver parenchyma, for example in the posterior liver dome, hepatic segment VII, measuring up to 1.4 x 1.1 cm (series 9, image 6) and in the inferior left lobe of the liver, hepatic segment III, measuring 3.2 x 2.4 cm (series 9, image 19). Sludge and gallstones in the gallbladder, including a large gallstone measuring at least 4.8 cm and multiple small gallstones near the gallbladder neck (series 4, image 15). Gallbladder wall thickening and pericholecystic fluid (series 4, image 20). Intra and extrahepatic biliary ductal dilatation without obstructing calculus identified to the ampulla, common bile duct measuring up to 1.0 cm in caliber (series 3, image 15). Pancreas: Unremarkable. No pancreatic ductal dilatation or surrounding inflammatory changes. Spleen: Normal in size without significant abnormality. Adrenals/Urinary Tract: Adrenal glands are unremarkable. Kidneys are normal, without renal calculi, solid lesion, or hydronephrosis. Stomach/Bowel: Stomach is within normal limits. No evidence of bowel wall thickening, distention, or inflammatory changes. Vascular/Lymphatic: No significant vascular findings are present. Bulky, matted appearing gastrohepatic ligament, porta hepatis, and retroperitoneal lymph nodes, largest porta hepatis node measuring 2.9 x 1.9 cm (series 9, image 15) Other: No abdominal wall hernia. Anasarca. Small volume ascites. Partially imaged bulky uterine fibroids (series 3, image 13). Musculoskeletal: No acute or significant osseous findings. IMPRESSION: 1. Sludge  and gallstones in the gallbladder, including a large gallstone measuring at least 4.8 cm and multiple small gallstones near the gallbladder neck. Gallbladder wall thickening and pericholecystic fluid. Findings are concerning for acute cholecystitis. 2. Intra and extrahepatic biliary ductal dilatation without obstructing calculus or other lesion identified to the ampulla. 3. Numerous subtly T2 hyperintense, somewhat ill-defined lesions throughout the liver parenchyma, highly suspicious for hepatic metastases although incompletely characterized by noncontrast MR. 4. Bulky, matted appearing abdominal lymph nodes, consistent with nodal metastatic disease. 5. Cirrhosis and ascites. 6. Partially imaged bulky uterine fibroids. Electronically Signed   By: Delanna Ahmadi M.D.   On: 04/11/2022 21:14   CT ABDOMEN PELVIS W CONTRAST  Result Date: 04/11/2022 CLINICAL DATA:  Pain right upper quadrant, diarrhea EXAM: CT ABDOMEN AND PELVIS WITH CONTRAST TECHNIQUE: Multidetector CT imaging of the abdomen and pelvis was performed using the standard protocol following bolus administration of intravenous contrast. RADIATION DOSE REDUCTION: This exam was performed according to the departmental dose-optimization program which includes automated exposure control, adjustment of the mA and/or kV according to patient size and/or use of iterative reconstruction technique. CONTRAST:  167m OMNIPAQUE IOHEXOL 300 MG/ML  SOLN COMPARISON:  Previous studies including CT done on 12/20/2021 and sonogram done on 04/11/2022 FINDINGS: Lower chest: No focal infiltrates are seen in visualized lower lung fields. Heart is enlarged in size. Dense calcification is seen in mitral annulus. Scattered coronary artery calcifications are seen. Hepatobiliary: There is fatty infiltration in liver. In image 12 of series 3, there is 1.3 cm inhomogeneous enhancing lesion in the right lobe. There are other smaller enhancing lesions in the liver. There is nodularity of  the liver surface suggesting possible cirrhosis. There is no dilation of bile ducts. Nausea gallbladder stones are seen. There is no significant wall thickening in gallbladder. There is fluid around the gallbladder which may be part of ascites. Pancreas: There is atrophy. There is dilation of pancreatic duct. No focal abnormalities are seen. Spleen: Unremarkable. Adrenals/Urinary Tract: Adrenals are unremarkable. There is no hydronephrosis. There are no renal or ureteral stones. The 7 mm low-density lesion in the lower pole of left kidney, possibly a cyst. Urinary bladder is unremarkable. Stomach/Bowel: Stomach is not distended. Small bowel loops are not dilated. The appendix is not dilated. There is no significant focal wall thickening in colon. There is no pericolic stranding. Vascular/Lymphatic: Extensive arterial calcifications are seen. There are abnormal enlarged lymph nodes in retroperitoneum measuring up to 2.2 cm in diameter. There is interval increase in number and size of retroperitoneal lymph nodes suggesting progression of metastatic disease. There is a new large lymph node measuring 3.4 x 2.2 cm in size slightly above the head of the pancreas close to support a bodies suggesting metastatic disease. Reproductive: Uterus is enlarged with multiple lesions of varying sizes suggesting uterine fibroids. Largest of the fibroids measures 7.5 cm in maximum diameter. There are no adnexal masses. Minimal amount of free fluid is seen in pelvis. Other: There is no pneumoperitoneum. Small umbilical hernia containing fat is seen. Musculoskeletal: Degenerative changes are noted in lumbar spine with disc space narrowing, bony spurs and encroachment of neural foramina at multiple levels. IMPRESSION: There is no evidence of intestinal obstruction or pneumoperitoneum. There is no hydronephrosis. There is interval appearance of multiple small enhancing lesions in the liver suggesting possible metastatic disease. Cirrhosis  of liver. There are enlarged lymph nodes in retroperitoneum and adjacent to porta hepatis with interval increase in size and number suggesting progression of metastatic lymphadenopathy. Large gallbladder stones. Small amount of fluid around the gallbladder is possibly small ascites. Less likely possibility would be acute cholecystitis. There is no wall thickening in gallbladder. There is no significant dilation of bile ducts. Multiple uterine fibroids. Minimal ascites. Other findings as described in the body of the report. Electronically Signed   By: Elmer Picker M.D.   On: 04/11/2022 14:54   US Abdomen Limited RUQ (LIVER/GB)  Result Date: 04/11/2022 CLINICAL DATA:  Right upper quadrant pain. EXAM: ULTRASOUND ABDOMEN LIMITED RIGHT UPPER QUADRANT COMPARISON:  CT, 12/20/2021. FINDINGS: Gallbladder: Gallbladder is distended. There are large shadowing stones measuring up to 5 cm. Wall is thickened to 5 mm, nonspecific in the setting of ascites. Common bile duct: Diameter: What appears to be the common bile duct measures 9 mm. Liver: Coarsened, heterogeneous echotexture. Surface nodularity. Heterogeneous hypoechoic lesion in the left lobe, that appears cystic, 2.5 x 1.8 x 1.5 cm. Oval hypoechoic lesion which may arise from the posteroinferior left lobe or be posterior to the liver, 3.5 x 1.8 x 2.5 cm. Additional small hypoechoic lesion in the left lobe measuring 8 mm. These lesions not evident on the prior CT. Portal vein is  patent on color Doppler imaging with normal direction of blood flow towards the liver. Other: Ascites. IMPRESSION: 1. Findings consistent with cirrhosis as noted on the prior CT. There are hypo to anechoic liver lesions that were not evident on the prior CT. This makes cysts unlikely, and these may reflect hypoechoic solid lesions. 2. Multiple gallstones. Gallbladder wall is thickened, but this is a nonspecific finding in the setting of ascites. Cannot exclude or confirm acute  cholecystitis. 3. Hypoechoic lesion that is likely posterior to the left liver lobe. This is suspected to be an enlarged porta hepatis lymph node. 4. Recommend follow-up imaging. For clinical suspicion of acute cholecystitis, recommend HIDA scan. For the liver findings, recommend follow-up liver MRI with and without contrast to assess for possible hepatocellular carcinoma. Electronically Signed   By: Lajean Manes M.D.   On: 04/11/2022 14:08   DG Chest Port 1 View  Result Date: 04/11/2022 CLINICAL DATA:  Chest pain and dyspnea on exertion EXAM: PORTABLE CHEST 1 VIEW COMPARISON:  Radiograph 08/09/2021 FINDINGS: Unchanged cardiomegaly. Heavy mitral annular calcifications. Aortic arch calcifications. There is no focal airspace consolidation. There is no pleural effusion. No pneumothorax. There is severe bilateral glenohumeral osteoarthritis with bony remodeling on the right. Calcified gallstone noted in the right upper quadrant as seen on prior CT. IMPRESSION: Unchanged cardiomegaly with extensive mitral annular calcifications. No focal airspace disease or overt pulmonary edema. Electronically Signed   By: Maurine Simmering M.D.   On: 04/11/2022 13:02    ECHO 08/10/2021 IMPRESSIONS     1. Left ventricular ejection fraction, by estimation, is 60 to 65%. The  left ventricle has normal function. The left ventricle has no regional  wall motion abnormalities. Left ventricular diastolic parameters are  indeterminate.   2. Right ventricular systolic function was not well visualized. The right  ventricular size is not well visualized.   3. Left atrial size was severely dilated.   4. The mitral valve is degenerative. Mild mitral valve regurgitation.  Mild mitral stenosis. Severe mitral annular calcification.   5. The aortic valve is calcified. Aortic valve regurgitation is not  visualized. Aortic valve sclerosis is present, with no evidence of aortic  valve stenosis.   FINDINGS   Left Ventricle: Left  ventricular ejection fraction, by estimation, is 60  to 65%. The left ventricle has normal function. The left ventricle has no  regional wall motion abnormalities. The left ventricular internal cavity  size was normal in size. There is   no left ventricular hypertrophy. Left ventricular diastolic parameters  are indeterminate.   Right Ventricle: The right ventricular size is not well visualized. Right  vetricular wall thickness was not well visualized. Right ventricular  systolic function was not well visualized.   Left Atrium: Left atrial size was severely dilated.   Right Atrium: Right atrial size was not well visualized.   Pericardium: There is no evidence of pericardial effusion.   Mitral Valve: The mitral valve is degenerative in appearance. Severe  mitral annular calcification. Mild mitral valve regurgitation. Mild mitral  valve stenosis. MV peak gradient, 10.5 mmHg. The mean mitral valve  gradient is 3.0 mmHg.   Tricuspid Valve: The tricuspid valve is normal in structure. Tricuspid  valve regurgitation is mild . No evidence of tricuspid stenosis.   Aortic Valve: The aortic valve is calcified. Aortic valve regurgitation is  not visualized. Aortic valve sclerosis is present, with no evidence of  aortic valve stenosis. Aortic valve mean gradient measures 4.3 mmHg.  Aortic valve peak  gradient measures 8.8   mmHg. Aortic valve area, by VTI measures 2.14 cm.   Pulmonic Valve: The pulmonic valve was not well visualized. Pulmonic valve  regurgitation is not visualized. No evidence of pulmonic stenosis.   Aorta: The aortic root and ascending aorta are structurally normal, with  no evidence of dilitation.   Venous: The inferior vena cava was not well visualized.   IAS/Shunts: The interatrial septum was not well visualized.      LEFT VENTRICLE  PLAX 2D  LVIDd:         4.20 cm  LVIDs:         2.80 cm  LV PW:         1.10 cm  LV IVS:        0.85 cm  LVOT diam:     2.00 cm   LV SV:         48  LV SV Index:   31  LVOT Area:     3.14 cm      RIGHT VENTRICLE  RV Basal diam:  3.40 cm  RV S prime:     11.90 cm/s  TAPSE (M-mode): 3.5 cm   LEFT ATRIUM              Index         RIGHT ATRIUM           Index  LA diam:        5.40 cm  3.45 cm/m    RA Area:     20.60 cm  LA Vol (A2C):   163.0 ml 104.18 ml/m  RA Volume:   56.90 ml  36.37 ml/m  LA Vol (A4C):   155.0 ml 99.07 ml/m  LA Biplane Vol: 158.0 ml 100.99 ml/m   AORTIC VALVE                    PULMONIC VALVE  AV Area (Vmax):    1.93 cm     PV Vmax:        0.84 m/s  AV Area (Vmean):   2.07 cm     PV Vmean:       54.900 cm/s  AV Area (VTI):     2.14 cm     PV VTI:         0.124 m  AV Vmax:           148.67 cm/s  PV Peak grad:   2.8 mmHg  AV Vmean:          98.533 cm/s  PV Mean grad:   1.0 mmHg  AV VTI:            0.226 m      RVOT Peak grad: 4 mmHg  AV Peak Grad:      8.8 mmHg  AV Mean Grad:      4.3 mmHg  LVOT Vmax:         91.40 cm/s  LVOT Vmean:        64.800 cm/s  LVOT VTI:          0.154 m  LVOT/AV VTI ratio: 0.68     AORTA  Ao Root diam: 2.77 cm   MITRAL VALVE                TRICUSPID VALVE  MV Area (PHT): 2.51 cm     TR Peak grad:   24.6 mmHg  MV Area VTI:   1.58 cm  TR Vmax:        248.00 cm/s  MV Peak grad:  10.5 mmHg  MV Mean grad:  3.0 mmHg     SHUNTS  MV Vmax:       1.62 m/s     Systemic VTI:  0.15 m  MV Vmean:      74.1 cm/s    Systemic Diam: 2.00 cm  MV Decel Time: 302 msec     Pulmonic VTI:  0.136 m  MV E velocity: 132.00 cm/s   Donnelly Angelica  Electronically signed by Donnelly Angelica  Signature Date/Time: 08/10/2021/1:19:46 PM   TELEMETRY reviewed by me (LT) 04/13/2022 : Atrial flutter with variable conduction rate between 90s to low 100s.  EKG reviewed by me: A-fib with RVR rate 186  Data reviewed by me (LT) 04/13/2022: ED note, admission H&P, CBC, BMP, telemetry, prior echo, outpatient cardiology note from 8/8, troponins, and BNP  ASSESSMENT AND PLAN:  Kristina Ellis is a  7yoF with a PMH of paroxysmal atrial fibrillation s/p TEE/DCCV 01/2020, bladder cancer s/p chemo (completed 07/2021) and XRT  (completed 08/2021), HFpEF (60-65% 08/2021), mild mitral stenosis, mild bilateral carotid artery stenosis, moderate pulmonary hypertension, liver cirrhosis, cholelithiasis who presented to [facility] ED 04/11/2022 with abdominal pain with associated N/V/D with abdominal MRI with findings concerning for acute cholecystitis. She was also in afib with RVR in the setting of alcohol use on admission for which cardiology is consulted for further assistance.  #Acute cholecystitis Seen on MRI abdomen pelvis, which also noted ill-defined lesions throughout the liver suspicious for hepatic metastases.  GI to see.  Refused cholecystectomy, surgery signed off. Now being treated with antibiotics.  #Paroxysmal A-fib with RVR #Chest pain Continues to deny chest discomfort, remains in atrial flutter with variable conduction with heart rate in the 90s to low 100s. -Continue amiodarone 200 mg once a day -Continue metoprolol 25 mg twice daily -We will restart Cardizem 60 mg p.o. twice daily, likely consolidate to 120 mg CD tomorrow -Continue Eliquis 5 mg twice daily for stroke risk reduction.  CHA2DS2-VASc 4 (age, sex, htn, pad)   This patient's plan of care was discussed and created with Dr. Nehemiah Massed and he is in agreement.  Signed: Tristan Schroeder , PA-C 04/13/2022, 10:09 AM [facility]  Patient had been feeling much better with no evidence of nausea vomiting.  She did have something to eat this morning for which she had kept down.  The patient has had no evidence of significant chest pain.  She continues on appropriate medication management for her atrial fibrillation atrial flutter but appears to be atrial flutter with controlled ventricular rate in the 90 bpm range.  We have restarted her Cardizem and metoprolol for heart rate control.  Due to no reasons for surgical intervention  she will have a restart of her Eliquis.  There has been no other cardiovascular symptoms at this time.  The patient has been interviewed and examined. I agree with assessment and plan above. Serafina Royals MD [facility]

## 2022-04-13 NOTE — Progress Notes (Signed)
PROGRESS NOTE    Kristina Ellis  PJA:[id number] DOB: 1951/03/17 DOA: 04/11/2022  PCP: [facility] [place]  Brief Narrative:  This 71 yrs old female with PMH significant for atrial fibrillation, cholelithiasis, bladder cancer, bilateral carotid stenosis, diastolic CHF, essential hypertension, pulmonary artery hypertension, presented in the ED with complaints of chest pain for 2 days, she also reports having abdominal pain associated with nausea , vomiting and diarrhea.  Patient was acutely hypoxic and lightheaded with palpitations in the ED , She was found to have A-fib with RVR.  Patient reports she has been binge drinking beer due to parties for last few weekends.  Patient is admitted for further evaluation.  She is found to have acute cholecystitis and multiple hepatic metastatic lesions . GI, general surgery, and oncology is consulted.  Assessment & Plan:   Principal Problem:   Chest pain Active Problems:   Atrial fibrillation with rapid ventricular response (HCC)   Acute cholecystitis   Hypokalemia   Hypomagnesemia   Bladder cancer (HCC)   Alcohol use   Diarrhea   Abdominal pain  Chest pain: > Resolved. Patient reports having intermittent right-sided chest pain likely noncardiac and related to cholecystitis.   Patient also found to have A-fib with RVR  causing heart strain. EKG shows some ST-T wave changes in aVL but troponins negative. Cardiology is consulted for further evaluation. Now chest pain has resolved. She remains in atrial flutter with variable heart rate. Recent Echo 08/2021: LVEF 60 to 65%, no RWMA.  A-fib with RVR: Heart rate is now reasonably controlled.   Cardizem gtt. discontinued.   Continue amiodarone 200 mg daily Continue metoprolol 25 mg twice  daily Continue Eliquis 5 mg twice daily. Started Cardizem 60 mg every 12 hours, plan to consolidate to 120 mg CD tomorrow.  Acute cholecystitis?: Patient does have findings consistent with  acute cholecystitis.  Patient has been refusing surgery. Continue ceftriaxone and Flagyl. General surgery signed off. Patient is aware, states she has gallstones for years.  Metastatic Disease: CT A/P and MRI shows hepatic metastatic lesions with enlarged lymph nodes.  CEA and AFP ordered. Oncology is consulted follow-up recommendation  Hypokalemia: Replaced and resolved.  Hypomagnesemia.   Replaced.  Continue to monitor  Bladder cancer: Patient does have intermittent hematuria, follows with urology outpatient.   Continue outpatient follow-up  Diarrhea Likely infectious/colitis/gallbladder disease. She reports diarrhea has improved.  EtOH abuse: Patient denies excessive alcohol use but reports doing binge drinking beer on weekends. Continue CIWA protocol. Fall precautions, aspiration precautions,  PT and OT evaluation.  DVT prophylaxis: Eliquis Code Status: Full code Family Communication: No family at bed side. Disposition Plan:   Status is: Inpatient Remains inpatient appropriate because: Admitted with chest pain A-fib with RVR and abdominal pain.  CT abdomen and MRI shows hepatic metastatic lesions.  Oncology is consulted  Anticipated discharge home in few days.  Consultants:  General surgery/cardiology  Procedures: None Antimicrobials:  Anti-infectives (From admission, onward)    Start     Dose/Rate Route Frequency Ordered Stop   04/12/22 1800  cefTRIAXone (ROCEPHIN) 2 g in sodium chloride 0.9 % 100 mL IVPB        2 g 200 mL/hr over 30 Minutes Intravenous Every 24 hours 04/12/22 0850 04/16/22 1759   04/12/22 1000  metroNIDAZOLE (FLAGYL) IVPB 500 mg        500 mg 100 mL/hr over 60 Minutes Intravenous Every 12 hours 04/11/22 2203     04/11/22 2215  cefTRIAXone (ROCEPHIN)  1 g in sodium chloride 0.9 % 100 mL IVPB  Status:  Discontinued        1 g 200 mL/hr over 30 Minutes Intravenous Every 24 hours 04/11/22 2203 04/12/22 0850   04/11/22 2130  cefTRIAXone  (ROCEPHIN) 1 g in sodium chloride 0.9 % 100 mL IVPB  Status:  Discontinued        1 g 200 mL/hr over 30 Minutes Intravenous  Once 04/11/22 2125 04/11/22 2204   04/11/22 2130  metroNIDAZOLE (FLAGYL) IVPB 500 mg        500 mg 100 mL/hr over 60 Minutes Intravenous  Once 04/11/22 2125 04/11/22 2345        Subjective: Patient was seen and examined at bedside.  Overnight events noted. Patient reports feeling better,  states she has abdominal pain and has gallstones for years and does not want to have cholecystectomy.  Risks associated explained in detail  Objective: Vitals:   04/13/22 0413 04/13/22 0422 04/13/22 0813 04/13/22 1212  BP: (!) 132/100  (!) 143/94 (!) 136/91  Pulse: 100  97 97  Resp: '16  18 20  '$ Temp: 98.1 F (36.7 C)  97.6 F (36.4 C) 97.6 F (36.4 C)  TempSrc: Oral     SpO2: 97%  97% 98%  Weight:  57.5 kg    Height:        Intake/Output Summary (Last 24 hours) at 04/13/2022 1253 Last data filed at 04/13/2022 0959 Gross per 24 hour  Intake 2408.45 ml  Output 650 ml  Net 1758.45 ml   Filed Weights   04/11/22 1223 04/12/22 1527 04/13/22 0422  Weight: 54.3 kg 57.2 kg 57.5 kg    Examination:  General exam: Appears sick looking, deconditioned, not in any acute distress. Respiratory system: CTA bilaterally, normal respiratory effort, no accessory muscle use. Cardiovascular system: S1 & S2 heard, irregular rhythm, no murmur.   Gastrointestinal system: Abdomen is soft, mildly tender, non distended, BS +. Central nervous system: Alert and oriented x 3. No focal neurological deficits. Extremities: No edema, no cyanosis, no clubbing Skin: No rashes, lesions or ulcers Psychiatry: Judgement and insight appear normal. Mood & affect appropriate.     Data Reviewed: I have personally reviewed following labs and imaging studies  CBC: Recent Labs  Lab 04/11/22 1222 04/12/22 0452 04/13/22 0459  WBC 11.3* 10.5 6.4  NEUTROABS 9.8*  --   --   HGB 12.8 9.5* 9.7*  HCT 38.3  29.3* 29.6*  MCV 104.1* 108.1* 107.6*  PLT 180 137* 564*   Basic Metabolic Panel: Recent Labs  Lab 04/11/22 1222 04/11/22 1853 04/12/22 0452 04/13/22 0459  NA 134*  --  138 135  K 2.6*  --  4.3 4.0  CL 97*  --  109 108  CO2 23  --  21* 22  GLUCOSE 96  --  90 85  BUN 7*  --  10 14  CREATININE 0.67  --  0.75 0.72  CALCIUM 8.5*  --  7.8* 7.8*  MG  --  1.2* 2.0 1.9  PHOS  --   --   --  2.1*   GFR: Estimated Creatinine Clearance: 49.8 mL/min (by C-G formula based on SCr of 0.72 mg/dL). Liver Function Tests: Recent Labs  Lab 04/11/22 1222 04/12/22 0452 04/13/22 0459  AST 32 24 39  ALT '14 12 15  '$ ALKPHOS 49 37* 39  BILITOT 2.2* 2.2* 1.1  PROT 8.9* 6.7 6.7  ALBUMIN 3.1* 2.3* 2.2*   Recent Labs  Lab 04/11/22 1222  LIPASE 27   No results for input(s): "AMMONIA" in the last 168 hours. Coagulation Profile: No results for input(s): "INR", "PROTIME" in the last 168 hours. Cardiac Enzymes: No results for input(s): "CKTOTAL", "CKMB", "CKMBINDEX", "TROPONINI" in the last 168 hours. BNP (last 3 results) No results for input(s): "PROBNP" in the last 8760 hours. HbA1C: No results for input(s): "HGBA1C" in the last 72 hours. CBG: No results for input(s): "GLUCAP" in the last 168 hours. Lipid Profile: No results for input(s): "CHOL", "HDL", "LDLCALC", "TRIG", "CHOLHDL", "LDLDIRECT" in the last 72 hours. Thyroid Function Tests: No results for input(s): "TSH", "T4TOTAL", "FREET4", "T3FREE", "THYROIDAB" in the last 72 hours. Anemia Panel: No results for input(s): "VITAMINB12", "FOLATE", "FERRITIN", "TIBC", "IRON", "RETICCTPCT" in the last 72 hours. Sepsis Labs: Recent Labs  Lab 04/11/22 1406 04/11/22 1853  LATICACIDVEN 1.7 1.3    No results found for this or any previous visit (from the past 240 hour(s)).    Radiology Studies: MR 3D Recon At Scanner  Result Date: 04/12/2022 CLINICAL DATA:  Cholelithiasis right upper quadrant pain, cirrhosis, history of bladder cancer  EXAM: MRI ABDOMEN WITHOUT CONTRAST  (INCLUDING MRCP) TECHNIQUE: Multiplanar multisequence MR imaging of the abdomen was performed. Heavily T2-weighted images of the biliary and pancreatic ducts were obtained, and three-dimensional MRCP images were rendered by post processing. COMPARISON:  Same-day CT abdomen pelvis FINDINGS: Lower chest:  Cardiomegaly.  Trace pleural effusions. Hepatobiliary: Coarse, nodular cirrhotic morphology of the liver. Numerous subtly T2 hyperintense, somewhat ill-defined lesions throughout the liver parenchyma, for example in the posterior liver dome, hepatic segment VII, measuring up to 1.4 x 1.1 cm (series 9, image 6) and in the inferior left lobe of the liver, hepatic segment III, measuring 3.2 x 2.4 cm (series 9, image 19). Sludge and gallstones in the gallbladder, including a large gallstone measuring at least 4.8 cm and multiple small gallstones near the gallbladder neck (series 4, image 15). Gallbladder wall thickening and pericholecystic fluid (series 4, image 20). Intra and extrahepatic biliary ductal dilatation without obstructing calculus identified to the ampulla, common bile duct measuring up to 1.0 cm in caliber (series 3, image 15). Pancreas: Unremarkable. No pancreatic ductal dilatation or surrounding inflammatory changes. Spleen: Normal in size without significant abnormality. Adrenals/Urinary Tract: Adrenal glands are unremarkable. Kidneys are normal, without renal calculi, solid lesion, or hydronephrosis. Stomach/Bowel: Stomach is within normal limits. No evidence of bowel wall thickening, distention, or inflammatory changes. Vascular/Lymphatic: No significant vascular findings are present. Bulky, matted appearing gastrohepatic ligament, porta hepatis, and retroperitoneal lymph nodes, largest porta hepatis node measuring 2.9 x 1.9 cm (series 9, image 15) Other: No abdominal wall hernia. Anasarca. Small volume ascites. Partially imaged bulky uterine fibroids (series 3,  image 13). Musculoskeletal: No acute or significant osseous findings. IMPRESSION: 1. Sludge and gallstones in the gallbladder, including a large gallstone measuring at least 4.8 cm and multiple small gallstones near the gallbladder neck. Gallbladder wall thickening and pericholecystic fluid. Findings are concerning for acute cholecystitis. 2. Intra and extrahepatic biliary ductal dilatation without obstructing calculus or other lesion identified to the ampulla. 3. Numerous subtly T2 hyperintense, somewhat ill-defined lesions throughout the liver parenchyma, highly suspicious for hepatic metastases although incompletely characterized by noncontrast MR. 4. Bulky, matted appearing abdominal lymph nodes, consistent with nodal metastatic disease. 5. Cirrhosis and ascites. 6. Partially imaged bulky uterine fibroids. Electronically Signed   By: Delanna Ahmadi M.D.   On: 04/12/2022 05:55   MR ABDOMEN MRCP WO CONTRAST  Result Date: 04/11/2022 CLINICAL DATA:  Cholelithiasis  right upper quadrant pain, cirrhosis, history of bladder cancer EXAM: MRI ABDOMEN WITHOUT CONTRAST  (INCLUDING MRCP) TECHNIQUE: Multiplanar multisequence MR imaging of the abdomen was performed. Heavily T2-weighted images of the biliary and pancreatic ducts were obtained, and three-dimensional MRCP images were rendered by post processing. COMPARISON:  Same-day CT abdomen pelvis FINDINGS: Lower chest:  Cardiomegaly.  Trace pleural effusions. Hepatobiliary: Coarse, nodular cirrhotic morphology of the liver. Numerous subtly T2 hyperintense, somewhat ill-defined lesions throughout the liver parenchyma, for example in the posterior liver dome, hepatic segment VII, measuring up to 1.4 x 1.1 cm (series 9, image 6) and in the inferior left lobe of the liver, hepatic segment III, measuring 3.2 x 2.4 cm (series 9, image 19). Sludge and gallstones in the gallbladder, including a large gallstone measuring at least 4.8 cm and multiple small gallstones near the  gallbladder neck (series 4, image 15). Gallbladder wall thickening and pericholecystic fluid (series 4, image 20). Intra and extrahepatic biliary ductal dilatation without obstructing calculus identified to the ampulla, common bile duct measuring up to 1.0 cm in caliber (series 3, image 15). Pancreas: Unremarkable. No pancreatic ductal dilatation or surrounding inflammatory changes. Spleen: Normal in size without significant abnormality. Adrenals/Urinary Tract: Adrenal glands are unremarkable. Kidneys are normal, without renal calculi, solid lesion, or hydronephrosis. Stomach/Bowel: Stomach is within normal limits. No evidence of bowel wall thickening, distention, or inflammatory changes. Vascular/Lymphatic: No significant vascular findings are present. Bulky, matted appearing gastrohepatic ligament, porta hepatis, and retroperitoneal lymph nodes, largest porta hepatis node measuring 2.9 x 1.9 cm (series 9, image 15) Other: No abdominal wall hernia. Anasarca. Small volume ascites. Partially imaged bulky uterine fibroids (series 3, image 13). Musculoskeletal: No acute or significant osseous findings. IMPRESSION: 1. Sludge and gallstones in the gallbladder, including a large gallstone measuring at least 4.8 cm and multiple small gallstones near the gallbladder neck. Gallbladder wall thickening and pericholecystic fluid. Findings are concerning for acute cholecystitis. 2. Intra and extrahepatic biliary ductal dilatation without obstructing calculus or other lesion identified to the ampulla. 3. Numerous subtly T2 hyperintense, somewhat ill-defined lesions throughout the liver parenchyma, highly suspicious for hepatic metastases although incompletely characterized by noncontrast MR. 4. Bulky, matted appearing abdominal lymph nodes, consistent with nodal metastatic disease. 5. Cirrhosis and ascites. 6. Partially imaged bulky uterine fibroids. Electronically Signed   By: Delanna Ahmadi M.D.   On: 04/11/2022 21:14   CT  ABDOMEN PELVIS W CONTRAST  Result Date: 04/11/2022 CLINICAL DATA:  Pain right upper quadrant, diarrhea EXAM: CT ABDOMEN AND PELVIS WITH CONTRAST TECHNIQUE: Multidetector CT imaging of the abdomen and pelvis was performed using the standard protocol following bolus administration of intravenous contrast. RADIATION DOSE REDUCTION: This exam was performed according to the departmental dose-optimization program which includes automated exposure control, adjustment of the mA and/or kV according to patient size and/or use of iterative reconstruction technique. CONTRAST:  170m OMNIPAQUE IOHEXOL 300 MG/ML  SOLN COMPARISON:  Previous studies including CT done on 12/20/2021 and sonogram done on 04/11/2022 FINDINGS: Lower chest: No focal infiltrates are seen in visualized lower lung fields. Heart is enlarged in size. Dense calcification is seen in mitral annulus. Scattered coronary artery calcifications are seen. Hepatobiliary: There is fatty infiltration in liver. In image 12 of series 3, there is 1.3 cm inhomogeneous enhancing lesion in the right lobe. There are other smaller enhancing lesions in the liver. There is nodularity of the liver surface suggesting possible cirrhosis. There is no dilation of bile ducts. Nausea gallbladder stones are seen. There is no  significant wall thickening in gallbladder. There is fluid around the gallbladder which may be part of ascites. Pancreas: There is atrophy. There is dilation of pancreatic duct. No focal abnormalities are seen. Spleen: Unremarkable. Adrenals/Urinary Tract: Adrenals are unremarkable. There is no hydronephrosis. There are no renal or ureteral stones. The 7 mm low-density lesion in the lower pole of left kidney, possibly a cyst. Urinary bladder is unremarkable. Stomach/Bowel: Stomach is not distended. Small bowel loops are not dilated. The appendix is not dilated. There is no significant focal wall thickening in colon. There is no pericolic stranding.  Vascular/Lymphatic: Extensive arterial calcifications are seen. There are abnormal enlarged lymph nodes in retroperitoneum measuring up to 2.2 cm in diameter. There is interval increase in number and size of retroperitoneal lymph nodes suggesting progression of metastatic disease. There is a new large lymph node measuring 3.4 x 2.2 cm in size slightly above the head of the pancreas close to support a bodies suggesting metastatic disease. Reproductive: Uterus is enlarged with multiple lesions of varying sizes suggesting uterine fibroids. Largest of the fibroids measures 7.5 cm in maximum diameter. There are no adnexal masses. Minimal amount of free fluid is seen in pelvis. Other: There is no pneumoperitoneum. Small umbilical hernia containing fat is seen. Musculoskeletal: Degenerative changes are noted in lumbar spine with disc space narrowing, bony spurs and encroachment of neural foramina at multiple levels. IMPRESSION: There is no evidence of intestinal obstruction or pneumoperitoneum. There is no hydronephrosis. There is interval appearance of multiple small enhancing lesions in the liver suggesting possible metastatic disease. Cirrhosis of liver. There are enlarged lymph nodes in retroperitoneum and adjacent to porta hepatis with interval increase in size and number suggesting progression of metastatic lymphadenopathy. Large gallbladder stones. Small amount of fluid around the gallbladder is possibly small ascites. Less likely possibility would be acute cholecystitis. There is no wall thickening in gallbladder. There is no significant dilation of bile ducts. Multiple uterine fibroids. Minimal ascites. Other findings as described in the body of the report. Electronically Signed   By: Elmer Picker M.D.   On: 04/11/2022 14:54   US Abdomen Limited RUQ (LIVER/GB)  Result Date: 04/11/2022 CLINICAL DATA:  Right upper quadrant pain. EXAM: ULTRASOUND ABDOMEN LIMITED RIGHT UPPER QUADRANT COMPARISON:  CT,  12/20/2021. FINDINGS: Gallbladder: Gallbladder is distended. There are large shadowing stones measuring up to 5 cm. Wall is thickened to 5 mm, nonspecific in the setting of ascites. Common bile duct: Diameter: What appears to be the common bile duct measures 9 mm. Liver: Coarsened, heterogeneous echotexture. Surface nodularity. Heterogeneous hypoechoic lesion in the left lobe, that appears cystic, 2.5 x 1.8 x 1.5 cm. Oval hypoechoic lesion which may arise from the posteroinferior left lobe or be posterior to the liver, 3.5 x 1.8 x 2.5 cm. Additional small hypoechoic lesion in the left lobe measuring 8 mm. These lesions not evident on the prior CT. Portal vein is patent on color Doppler imaging with normal direction of blood flow towards the liver. Other: Ascites. IMPRESSION: 1. Findings consistent with cirrhosis as noted on the prior CT. There are hypo to anechoic liver lesions that were not evident on the prior CT. This makes cysts unlikely, and these may reflect hypoechoic solid lesions. 2. Multiple gallstones. Gallbladder wall is thickened, but this is a nonspecific finding in the setting of ascites. Cannot exclude or confirm acute cholecystitis. 3. Hypoechoic lesion that is likely posterior to the left liver lobe. This is suspected to be an enlarged porta hepatis lymph node.  4. Recommend follow-up imaging. For clinical suspicion of acute cholecystitis, recommend HIDA scan. For the liver findings, recommend follow-up liver MRI with and without contrast to assess for possible hepatocellular carcinoma. Electronically Signed   By: Lajean Manes M.D.   On: 04/11/2022 14:08   DG Chest Port 1 View  Result Date: 04/11/2022 CLINICAL DATA:  Chest pain and dyspnea on exertion EXAM: PORTABLE CHEST 1 VIEW COMPARISON:  Radiograph 08/09/2021 FINDINGS: Unchanged cardiomegaly. Heavy mitral annular calcifications. Aortic arch calcifications. There is no focal airspace consolidation. There is no pleural effusion. No  pneumothorax. There is severe bilateral glenohumeral osteoarthritis with bony remodeling on the right. Calcified gallstone noted in the right upper quadrant as seen on prior CT. IMPRESSION: Unchanged cardiomegaly with extensive mitral annular calcifications. No focal airspace disease or overt pulmonary edema. Electronically Signed   By: Maurine Simmering M.D.   On: 04/11/2022 13:02    Scheduled Meds:  amiodarone  200 mg Oral Daily   apixaban  5 mg Oral BID   atorvastatin  40 mg Oral Daily   [START ON 04/14/2022] diltiazem  120 mg Oral Daily   diltiazem  60 mg Oral BID   folic acid  1 mg Oral Daily   metoprolol succinate  25 mg Oral BID   multivitamin with minerals  1 tablet Oral Daily   sodium chloride flush  3 mL Intravenous Q12H   thiamine (VITAMIN B1) injection  100 mg Intravenous Daily   Continuous Infusions:  sodium chloride 30 mL/hr at 04/11/22 1254   sodium chloride 50 mL/hr at 04/12/22 2058   cefTRIAXone (ROCEPHIN)  IV 2 g (04/12/22 1808)   metronidazole 500 mg (04/13/22 0907)     LOS: 2 days    Time spent: 35 mins    Joesphine Schemm, MD [facility]   If 7PM-7AM, please contact night-coverage

## 2022-04-14 ENCOUNTER — Ambulatory Visit: Payer: Medicare Other

## 2022-04-14 DIAGNOSIS — R079 Chest pain, unspecified: Secondary | ICD-10-CM | POA: Diagnosis not present

## 2022-04-14 DIAGNOSIS — E876 Hypokalemia: Secondary | ICD-10-CM

## 2022-04-14 DIAGNOSIS — I4891 Unspecified atrial fibrillation: Secondary | ICD-10-CM

## 2022-04-14 DIAGNOSIS — R1011 Right upper quadrant pain: Secondary | ICD-10-CM

## 2022-04-14 DIAGNOSIS — K810 Acute cholecystitis: Secondary | ICD-10-CM | POA: Diagnosis not present

## 2022-04-14 DIAGNOSIS — Z789 Other specified health status: Secondary | ICD-10-CM

## 2022-04-14 LAB — CBC
HCT: 30 % — ABNORMAL LOW (ref 36.0–46.0)
Hemoglobin: 9.9 g/dL — ABNORMAL LOW (ref 12.0–15.0)
MCH: 35.2 pg — ABNORMAL HIGH (ref 26.0–34.0)
MCHC: 33 g/dL (ref 30.0–36.0)
MCV: 106.8 fL — ABNORMAL HIGH (ref 80.0–100.0)
Platelets: 161 10*3/uL (ref 150–400)
RBC: 2.81 MIL/uL — ABNORMAL LOW (ref 3.87–5.11)
RDW: 13.5 % (ref 11.5–15.5)
WBC: 4.6 10*3/uL (ref 4.0–10.5)
nRBC: 0 % (ref 0.0–0.2)

## 2022-04-14 MED ORDER — METOPROLOL SUCCINATE ER 25 MG PO TB24
25.0000 mg | ORAL_TABLET | Freq: Two times a day (BID) | ORAL | 0 refills | Status: DC
Start: 2022-04-14 — End: 2022-10-12

## 2022-04-14 MED ORDER — DILTIAZEM HCL ER COATED BEADS 120 MG PO CP24: 120.0000 mg | ORAL_CAPSULE | Freq: Every day | ORAL | 0 refills | Status: DC

## 2022-04-14 MED ORDER — K PHOS MONO-SOD PHOS DI & MONO 155-852-130 MG PO TABS
500.0000 mg | ORAL_TABLET | ORAL | Status: DC
  Administered 2022-04-14: 500 mg via ORAL
  Filled 2022-04-14 (×3): qty 2

## 2022-04-14 MED ORDER — METRONIDAZOLE 500 MG PO TABS: 500.0000 mg | ORAL_TABLET | Freq: Two times a day (BID) | ORAL | 0 refills | Status: AC

## 2022-04-14 MED ORDER — ADULT MULTIVITAMIN W/MINERALS CH: 1.0000 | ORAL_TABLET | Freq: Every day | ORAL | 0 refills | Status: DC

## 2022-04-14 MED ORDER — CIPROFLOXACIN HCL 500 MG PO TABS: 500.0000 mg | ORAL_TABLET | Freq: Two times a day (BID) | ORAL | 0 refills | Status: AC

## 2022-04-14 MED ORDER — ELIQUIS 5 MG PO TABS: 5.0000 mg | ORAL_TABLET | Freq: Two times a day (BID) | ORAL | 1 refills | Status: AC

## 2022-04-14 MED ORDER — SODIUM PHOSPHATES 45 MMOLE/15ML IV SOLN: 15.0000 mmol | Freq: Once | INTRAVENOUS | Status: DC

## 2022-04-14 NOTE — Discharge Summary (Signed)
Physician Discharge Summary   Patient: Kristina Ellis MRN: [id number] DOB: 1951/03/07  Admit date:     04/11/2022  Discharge date: 04/14/2022  Discharge Physician: Lorella Nimrod   PCP: [facility] [place]   Recommendations at discharge:  Follow-up with oncology within the next 1 to 2 days. Concern of disease progression with hepatic mets. Pending results for AFP and CEA.  Discharge Diagnoses: Principal Problem:   Chest pain Active Problems:   Atrial fibrillation with rapid ventricular response (HCC)   Acute cholecystitis   Hypokalemia   Hypomagnesemia   Bladder cancer (HCC)   Alcohol use   Diarrhea   Abdominal pain   Hospital Course: 71 yrs old female with PMH significant for atrial fibrillation, cholelithiasis, bladder cancer, bilateral carotid stenosis, diastolic CHF, essential hypertension, pulmonary artery hypertension, presented in the ED with complaints of chest pain for 2 days, she also reports having abdominal pain associated with nausea , vomiting and diarrhea.  Patient was acutely hypoxic and lightheaded with palpitations in the ED , She was found to have A-fib with RVR.  Patient reports she has been binge drinking beer due to parties for last few weekends.  Patient is admitted for further evaluation.  She is found to have acute cholecystitis and multiple hepatic metastatic lesions . GI, general surgery, and oncology is consulted.  Chest pain resolved with resolution of RVR.  Cardiology is recommending continuation of amiodarone, Cardizem, metoprolol and Eliquis.  For concern of cholecystitis patient received ceftriaxone and Flagyl while in the hospital and discharged on Cipro and Flagyl to complete a 1 week course.  He was evaluated by general surgery but declined any surgery stating that she is dealing with gallstones for years.  Oncology was consulted for concern of hepatic metastatic lesions with enlarged lymph nodes.  CEA and AFP was ordered.  Dr.  Maryjane Hurter was notified and they will follow-up closely as an outpatient.  Patient might not be a candidate for any further treatment.  Patient was counseled extensively against alcohol use.  Also found to have hypokalemia and hypomagnesemia which were repleted before discharge.  Patient will continue on current medications and need to have a close follow-up with her providers especially oncology for concern of disease progression with new metastatic lesions.  Assessment and Plan: * Chest pain Pt states she was having intermittent rt sided lower chest pain.  Suspect noncardiac and related to her cholecystitis.  Also could be from a.fib rvr causing strain TNI negative.  ekg shows some st changes in avl but we will follow TNI and am team to seek cardiology consult for eval of chest pain as pot has h./o NSTEMI.    Atrial fibrillation with rapid ventricular response (HCC) Attribute rvr to hypomagnesemia secondary to alcohol binge.  Currently rate controlled.    Acute cholecystitis Per gen surg pt has been seen and has refused surgical treatment.    Hypokalemia replace and follow level.    Hypomagnesemia replace and follow levels.    Bladder cancer ([place]) Pt has intermittent hematuria.  Diarrhea ? Infectious / Colitis/ GIB.  We will follow.  Collect stool and send sample.   Alcohol use Pt denies any excessive alcohol use but was on bigne for past few weeks while at [employer].  CIWA. Follow low mag and treat.  IV PPI. Fall precaution.  Aspiration precaution.    Consultants: General surgery, oncology, cardiology Procedures performed: None Disposition: Home Diet recommendation:  Discharge Diet Orders (From admission, onward)  Start     Ordered   04/14/22 0000  Diet - low sodium heart healthy        04/14/22 1247           Cardiac diet DISCHARGE MEDICATION: Allergies as of 04/14/2022       Reactions   Atenolol Other (See Comments)    bradycardia bradycardia   Penicillins Rash        Medication List     STOP taking these medications    amLODipine 10 MG tablet Commonly known as: NORVASC       TAKE these medications    amiodarone 200 MG tablet Commonly known as: PACERONE Take by mouth.   atorvastatin 40 MG tablet Commonly known as: LIPITOR Take 40 mg by mouth at bedtime.   ciprofloxacin 500 MG tablet Commonly known as: Cipro Take 1 tablet (500 mg total) by mouth 2 (two) times daily for 3 days.   diltiazem 120 MG 24 hr capsule Commonly known as: Cardizem CD Take 1 capsule (120 mg total) by mouth daily.   Eliquis 5 MG Tabs tablet Generic drug: apixaban Take 1 tablet (5 mg total) by mouth 2 (two) times daily.   magnesium oxide 400 MG tablet Commonly known as: MAG-OX Take 1 tablet by mouth 2 (two) times daily.   metoprolol succinate 25 MG 24 hr tablet Commonly known as: TOPROL-XL Take 1 tablet (25 mg total) by mouth 2 (two) times daily. Take with or immediately following a meal.   metroNIDAZOLE 500 MG tablet Commonly known as: FLAGYL Take 1 tablet (500 mg total) by mouth 2 (two) times daily for 3 days.   multivitamin with minerals Tabs tablet Take 1 tablet by mouth daily. Start taking on: April 15, 2022   traMADol 50 MG tablet Commonly known as: ULTRAM Take One tab PO Q6 hours PRN pain   trolamine salicylate 10 % cream Commonly known as: ASPERCREME Apply 1 application topically as needed for muscle pain.        Follow-up Information     Yolonda Kida, MD. Go in 1 week(s).   Specialties: Cardiology, Internal Medicine Contact information: [place] [address] [phone]                Discharge Exam: Danley Danker Weights   04/11/22 1223 04/12/22 1527 04/13/22 0422  Weight: 54.3 kg 57.2 kg 57.5 kg   General.  Frail elderly lady, in no acute distress. Pulmonary.  Lungs clear bilaterally, normal respiratory effort. CV.  Regular rate and  rhythm, no JVD, rub or murmur. Abdomen.  Soft, nontender, nondistended, BS positive. CNS.  Alert and oriented .  No focal neurologic deficit. Extremities.  No edema, no cyanosis, pulses intact and symmetrical. Psychiatry.  Judgment and insight appears normal.   Condition at discharge: stable  The results of significant diagnostics from this hospitalization (including imaging, microbiology, ancillary and laboratory) are listed below for reference.   Imaging Studies: MR 3D Recon At Scanner  Result Date: 04/12/2022 CLINICAL DATA:  Cholelithiasis right upper quadrant pain, cirrhosis, history of bladder cancer EXAM: MRI ABDOMEN WITHOUT CONTRAST  (INCLUDING MRCP) TECHNIQUE: Multiplanar multisequence MR imaging of the abdomen was performed. Heavily T2-weighted images of the biliary and pancreatic ducts were obtained, and three-dimensional MRCP images were rendered by post processing. COMPARISON:  Same-day CT abdomen pelvis FINDINGS: Lower chest:  Cardiomegaly.  Trace pleural effusions. Hepatobiliary: Coarse, nodular cirrhotic morphology of the liver. Numerous subtly T2 hyperintense, somewhat ill-defined lesions throughout the liver parenchyma, for  example in the posterior liver dome, hepatic segment VII, measuring up to 1.4 x 1.1 cm (series 9, image 6) and in the inferior left lobe of the liver, hepatic segment III, measuring 3.2 x 2.4 cm (series 9, image 19). Sludge and gallstones in the gallbladder, including a large gallstone measuring at least 4.8 cm and multiple small gallstones near the gallbladder neck (series 4, image 15). Gallbladder wall thickening and pericholecystic fluid (series 4, image 20). Intra and extrahepatic biliary ductal dilatation without obstructing calculus identified to the ampulla, common bile duct measuring up to 1.0 cm in caliber (series 3, image 15). Pancreas: Unremarkable. No pancreatic ductal dilatation or surrounding inflammatory changes. Spleen: Normal in size without  significant abnormality. Adrenals/Urinary Tract: Adrenal glands are unremarkable. Kidneys are normal, without renal calculi, solid lesion, or hydronephrosis. Stomach/Bowel: Stomach is within normal limits. No evidence of bowel wall thickening, distention, or inflammatory changes. Vascular/Lymphatic: No significant vascular findings are present. Bulky, matted appearing gastrohepatic ligament, porta hepatis, and retroperitoneal lymph nodes, largest porta hepatis node measuring 2.9 x 1.9 cm (series 9, image 15) Other: No abdominal wall hernia. Anasarca. Small volume ascites. Partially imaged bulky uterine fibroids (series 3, image 13). Musculoskeletal: No acute or significant osseous findings. IMPRESSION: 1. Sludge and gallstones in the gallbladder, including a large gallstone measuring at least 4.8 cm and multiple small gallstones near the gallbladder neck. Gallbladder wall thickening and pericholecystic fluid. Findings are concerning for acute cholecystitis. 2. Intra and extrahepatic biliary ductal dilatation without obstructing calculus or other lesion identified to the ampulla. 3. Numerous subtly T2 hyperintense, somewhat ill-defined lesions throughout the liver parenchyma, highly suspicious for hepatic metastases although incompletely characterized by noncontrast MR. 4. Bulky, matted appearing abdominal lymph nodes, consistent with nodal metastatic disease. 5. Cirrhosis and ascites. 6. Partially imaged bulky uterine fibroids. Electronically Signed   By: Delanna Ahmadi M.D.   On: 04/12/2022 05:55   MR ABDOMEN MRCP WO CONTRAST  Result Date: 04/11/2022 CLINICAL DATA:  Cholelithiasis right upper quadrant pain, cirrhosis, history of bladder cancer EXAM: MRI ABDOMEN WITHOUT CONTRAST  (INCLUDING MRCP) TECHNIQUE: Multiplanar multisequence MR imaging of the abdomen was performed. Heavily T2-weighted images of the biliary and pancreatic ducts were obtained, and three-dimensional MRCP images were rendered by post  processing. COMPARISON:  Same-day CT abdomen pelvis FINDINGS: Lower chest:  Cardiomegaly.  Trace pleural effusions. Hepatobiliary: Coarse, nodular cirrhotic morphology of the liver. Numerous subtly T2 hyperintense, somewhat ill-defined lesions throughout the liver parenchyma, for example in the posterior liver dome, hepatic segment VII, measuring up to 1.4 x 1.1 cm (series 9, image 6) and in the inferior left lobe of the liver, hepatic segment III, measuring 3.2 x 2.4 cm (series 9, image 19). Sludge and gallstones in the gallbladder, including a large gallstone measuring at least 4.8 cm and multiple small gallstones near the gallbladder neck (series 4, image 15). Gallbladder wall thickening and pericholecystic fluid (series 4, image 20). Intra and extrahepatic biliary ductal dilatation without obstructing calculus identified to the ampulla, common bile duct measuring up to 1.0 cm in caliber (series 3, image 15). Pancreas: Unremarkable. No pancreatic ductal dilatation or surrounding inflammatory changes. Spleen: Normal in size without significant abnormality. Adrenals/Urinary Tract: Adrenal glands are unremarkable. Kidneys are normal, without renal calculi, solid lesion, or hydronephrosis. Stomach/Bowel: Stomach is within normal limits. No evidence of bowel wall thickening, distention, or inflammatory changes. Vascular/Lymphatic: No significant vascular findings are present. Bulky, matted appearing gastrohepatic ligament, porta hepatis, and retroperitoneal lymph nodes, largest porta hepatis node measuring 2.9  x 1.9 cm (series 9, image 15) Other: No abdominal wall hernia. Anasarca. Small volume ascites. Partially imaged bulky uterine fibroids (series 3, image 13). Musculoskeletal: No acute or significant osseous findings. IMPRESSION: 1. Sludge and gallstones in the gallbladder, including a large gallstone measuring at least 4.8 cm and multiple small gallstones near the gallbladder neck. Gallbladder wall thickening and  pericholecystic fluid. Findings are concerning for acute cholecystitis. 2. Intra and extrahepatic biliary ductal dilatation without obstructing calculus or other lesion identified to the ampulla. 3. Numerous subtly T2 hyperintense, somewhat ill-defined lesions throughout the liver parenchyma, highly suspicious for hepatic metastases although incompletely characterized by noncontrast MR. 4. Bulky, matted appearing abdominal lymph nodes, consistent with nodal metastatic disease. 5. Cirrhosis and ascites. 6. Partially imaged bulky uterine fibroids. Electronically Signed   By: Delanna Ahmadi M.D.   On: 04/11/2022 21:14   CT ABDOMEN PELVIS W CONTRAST  Result Date: 04/11/2022 CLINICAL DATA:  Pain right upper quadrant, diarrhea EXAM: CT ABDOMEN AND PELVIS WITH CONTRAST TECHNIQUE: Multidetector CT imaging of the abdomen and pelvis was performed using the standard protocol following bolus administration of intravenous contrast. RADIATION DOSE REDUCTION: This exam was performed according to the departmental dose-optimization program which includes automated exposure control, adjustment of the mA and/or kV according to patient size and/or use of iterative reconstruction technique. CONTRAST:  122m OMNIPAQUE IOHEXOL 300 MG/ML  SOLN COMPARISON:  Previous studies including CT done on 12/20/2021 and sonogram done on 04/11/2022 FINDINGS: Lower chest: No focal infiltrates are seen in visualized lower lung fields. Heart is enlarged in size. Dense calcification is seen in mitral annulus. Scattered coronary artery calcifications are seen. Hepatobiliary: There is fatty infiltration in liver. In image 12 of series 3, there is 1.3 cm inhomogeneous enhancing lesion in the right lobe. There are other smaller enhancing lesions in the liver. There is nodularity of the liver surface suggesting possible cirrhosis. There is no dilation of bile ducts. Nausea gallbladder stones are seen. There is no significant wall thickening in gallbladder.  There is fluid around the gallbladder which may be part of ascites. Pancreas: There is atrophy. There is dilation of pancreatic duct. No focal abnormalities are seen. Spleen: Unremarkable. Adrenals/Urinary Tract: Adrenals are unremarkable. There is no hydronephrosis. There are no renal or ureteral stones. The 7 mm low-density lesion in the lower pole of left kidney, possibly a cyst. Urinary bladder is unremarkable. Stomach/Bowel: Stomach is not distended. Small bowel loops are not dilated. The appendix is not dilated. There is no significant focal wall thickening in colon. There is no pericolic stranding. Vascular/Lymphatic: Extensive arterial calcifications are seen. There are abnormal enlarged lymph nodes in retroperitoneum measuring up to 2.2 cm in diameter. There is interval increase in number and size of retroperitoneal lymph nodes suggesting progression of metastatic disease. There is a new large lymph node measuring 3.4 x 2.2 cm in size slightly above the head of the pancreas close to support a bodies suggesting metastatic disease. Reproductive: Uterus is enlarged with multiple lesions of varying sizes suggesting uterine fibroids. Largest of the fibroids measures 7.5 cm in maximum diameter. There are no adnexal masses. Minimal amount of free fluid is seen in pelvis. Other: There is no pneumoperitoneum. Small umbilical hernia containing fat is seen. Musculoskeletal: Degenerative changes are noted in lumbar spine with disc space narrowing, bony spurs and encroachment of neural foramina at multiple levels. IMPRESSION: There is no evidence of intestinal obstruction or pneumoperitoneum. There is no hydronephrosis. There is interval appearance of multiple small enhancing lesions  in the liver suggesting possible metastatic disease. Cirrhosis of liver. There are enlarged lymph nodes in retroperitoneum and adjacent to porta hepatis with interval increase in size and number suggesting progression of metastatic  lymphadenopathy. Large gallbladder stones. Small amount of fluid around the gallbladder is possibly small ascites. Less likely possibility would be acute cholecystitis. There is no wall thickening in gallbladder. There is no significant dilation of bile ducts. Multiple uterine fibroids. Minimal ascites. Other findings as described in the body of the report. Electronically Signed   By: Elmer Picker M.D.   On: 04/11/2022 14:54   US Abdomen Limited RUQ (LIVER/GB)  Result Date: 04/11/2022 CLINICAL DATA:  Right upper quadrant pain. EXAM: ULTRASOUND ABDOMEN LIMITED RIGHT UPPER QUADRANT COMPARISON:  CT, 12/20/2021. FINDINGS: Gallbladder: Gallbladder is distended. There are large shadowing stones measuring up to 5 cm. Wall is thickened to 5 mm, nonspecific in the setting of ascites. Common bile duct: Diameter: What appears to be the common bile duct measures 9 mm. Liver: Coarsened, heterogeneous echotexture. Surface nodularity. Heterogeneous hypoechoic lesion in the left lobe, that appears cystic, 2.5 x 1.8 x 1.5 cm. Oval hypoechoic lesion which may arise from the posteroinferior left lobe or be posterior to the liver, 3.5 x 1.8 x 2.5 cm. Additional small hypoechoic lesion in the left lobe measuring 8 mm. These lesions not evident on the prior CT. Portal vein is patent on color Doppler imaging with normal direction of blood flow towards the liver. Other: Ascites. IMPRESSION: 1. Findings consistent with cirrhosis as noted on the prior CT. There are hypo to anechoic liver lesions that were not evident on the prior CT. This makes cysts unlikely, and these may reflect hypoechoic solid lesions. 2. Multiple gallstones. Gallbladder wall is thickened, but this is a nonspecific finding in the setting of ascites. Cannot exclude or confirm acute cholecystitis. 3. Hypoechoic lesion that is likely posterior to the left liver lobe. This is suspected to be an enlarged porta hepatis lymph node. 4. Recommend follow-up imaging. For  clinical suspicion of acute cholecystitis, recommend HIDA scan. For the liver findings, recommend follow-up liver MRI with and without contrast to assess for possible hepatocellular carcinoma. Electronically Signed   By: Lajean Manes M.D.   On: 04/11/2022 14:08   DG Chest Port 1 View  Result Date: 04/11/2022 CLINICAL DATA:  Chest pain and dyspnea on exertion EXAM: PORTABLE CHEST 1 VIEW COMPARISON:  Radiograph 08/09/2021 FINDINGS: Unchanged cardiomegaly. Heavy mitral annular calcifications. Aortic arch calcifications. There is no focal airspace consolidation. There is no pleural effusion. No pneumothorax. There is severe bilateral glenohumeral osteoarthritis with bony remodeling on the right. Calcified gallstone noted in the right upper quadrant as seen on prior CT. IMPRESSION: Unchanged cardiomegaly with extensive mitral annular calcifications. No focal airspace disease or overt pulmonary edema. Electronically Signed   By: Maurine Simmering M.D.   On: 04/11/2022 13:02    Microbiology: Results for orders placed or performed in visit on 01/07/22  Microscopic Examination     Status: Abnormal   Collection Time: 01/07/22  2:28 PM   Urine  Result Value Ref Range Status   WBC, UA 11-30 (A) 0 - 5 /hpf Final   RBC, Urine 0-2 0 - 2 /hpf Final   Epithelial Cells (non renal) 0-10 0 - 10 /hpf Final   Bacteria, UA Many (A) None seen/Few Final    Labs: CBC: Recent Labs  Lab 04/11/22 1222 04/12/22 0452 04/13/22 0459 04/14/22 0600  WBC 11.3* 10.5 6.4 4.6  NEUTROABS 9.8*  --   --   --  HGB 12.8 9.5* 9.7* 9.9*  HCT 38.3 29.3* 29.6* 30.0*  MCV 104.1* 108.1* 107.6* 106.8*  PLT 180 137* 146* 396   Basic Metabolic Panel: Recent Labs  Lab 04/11/22 1222 04/11/22 1853 04/12/22 0452 04/13/22 0459  NA 134*  --  138 135  K 2.6*  --  4.3 4.0  CL 97*  --  109 108  CO2 23  --  21* 22  GLUCOSE 96  --  90 85  BUN 7*  --  10 14  CREATININE 0.67  --  0.75 0.72  CALCIUM 8.5*  --  7.8* 7.8*  MG  --  1.2* 2.0  1.9  PHOS  --   --   --  2.1*   Liver Function Tests: Recent Labs  Lab 04/11/22 1222 04/12/22 0452 04/13/22 0459  AST 32 24 39  ALT '14 12 15  '$ ALKPHOS 49 37* 39  BILITOT 2.2* 2.2* 1.1  PROT 8.9* 6.7 6.7  ALBUMIN 3.1* 2.3* 2.2*   CBG: No results for input(s): "GLUCAP" in the last 168 hours.  Discharge time spent: greater than 30 minutes.  This record has been created using Systems analyst. Errors have been sought and corrected,but may not always be located. Such creation errors do not reflect on the standard of care.   Signed: Lorella Nimrod, MD [facility] 04/14/2022

## 2022-04-14 NOTE — Care Management Important Message (Signed)
Important Message  Patient Details  Name: Kristina Ellis MRN: [id number] Date of Birth: 05/02/1951   Medicare Important Message Given:  Yes     [name] 04/14/2022, 10:52 AM

## 2022-04-14 NOTE — Progress Notes (Signed)
Patient to discharge today with home/self care no needs at this time. TOC will sign off.   [place], [place]

## 2022-04-15 ENCOUNTER — Inpatient Hospital Stay: Payer: Medicare Other | Attending: Oncology | Admitting: Oncology

## 2022-04-15 LAB — CEA: CEA: 22.7 ng/mL — ABNORMAL HIGH (ref 0.0–4.7)

## 2022-04-15 LAB — AFP TUMOR MARKER: AFP, Serum, Tumor Marker: 11.2 ng/mL — ABNORMAL HIGH (ref 0.0–9.2)

## 2022-04-29 ENCOUNTER — Ambulatory Visit (INDEPENDENT_AMBULATORY_CARE_PROVIDER_SITE_OTHER): Payer: Medicare Other | Admitting: Urology

## 2022-04-29 ENCOUNTER — Encounter: Payer: Self-pay | Admitting: Urology

## 2022-04-29 VITALS — BP 133/96 | HR 144 | Wt 126.0 lb

## 2022-04-29 DIAGNOSIS — C679 Malignant neoplasm of bladder, unspecified: Secondary | ICD-10-CM | POA: Diagnosis not present

## 2022-04-29 NOTE — Progress Notes (Signed)
   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 She deferred referral to consider cystectomy and urinary diversion, and opted for tri-modal therapy with chemotherapy(cisplatin x4) and radiation(XRT completed January 2023).  PET scan in November showed no evidence of metastatic disease.  Most recent cystoscopy from 01/13/2022 showed no abnormal findings within the bladder, and cytology was benign.  She was scheduled for cystoscopy today, but deferred in setting of ongoing diarrhea since recent hospitalization.  She denies any gross hematuria.  I reviewed the notes and images from her recent hospitalization on 04/11/22-04/14/22.  At that time she was felt to have cholecystitis but deferred surgical intervention was treated with antibiotics.  I personally viewed and interpreted the CT and MRI from that hospitalization that shows ill-defined lesions throughout the liver parenchyma suspicious for metastatic disease, as well as bulky matted appearing abdominal lymph nodes concerning for metastatic disease.  CEA was elevated at 22.7, and AFP elevated at 11.2.  We reviewed these findings, and my concern for metastatic disease.  Most likely would be bladder cancer with her history, but could also be related to a colon, pancreatic, or liver primary, especially with the elevated CEA.  Regarding her diarrhea, I recommended being evaluated for possible C. difficile with her recent course of antibiotics, as well as evaluation by GI she also reports blood in the stool.  I reached out to her oncologist Dr. Grayland Ormond to get her plugged back in with oncology, likely will need PET scan, and consideration of potentially systemic  treatments, however she has been resistant to aggressive treatment in the past and palliative care may be an alternative option.  Follow-up with urology in 3 months  I spent 45 total minutes on the day of the encounter including pre-visit review of the medical record, face-to-face time with the patient, and post visit ordering of labs/imaging/tests.

## 2022-05-10 NOTE — Progress Notes (Signed)
[place]  [phone] [phone]  ID: Kristina Ellis OB: 1951-05-21  MR#: [id number]  GYI#:[id number]  Patient Care Team: [facility] [place] as PCP - General (General Practice) Lloyd Huger, MD as Consulting Physician (Hematology and Oncology)  CHIEF COMPLAINT: Stage II bladder cancer.  INTERVAL HISTORY: Patient returns to clinic today for further evaluation and discussion of her imaging results.  She has persistent diarrhea and intermittent abdominal pain, but otherwise feels well.  She has chronic weakness and fatigue.  She has no neurologic complaints.  She denies any recent fevers or illnesses.  She has a good appetite and denies weight loss.  She has no chest pain, cough, or hemoptysis.  She denies any nausea, vomiting, or constipation.  She has no urinary complaints.  Patient offers no further specific complaints today.  REVIEW OF SYSTEMS:   Review of Systems  Constitutional:  Positive for malaise/fatigue. Negative for fever and weight loss.  Respiratory: Negative.  Negative for cough, hemoptysis and shortness of breath.   Cardiovascular: Negative.  Negative for chest pain and leg swelling.  Gastrointestinal:  Positive for abdominal pain and diarrhea.  Genitourinary:  Negative for hematuria.  Musculoskeletal:  Negative for back pain, falls and joint pain.  Skin: Negative.  Negative for rash.  Neurological:  Positive for weakness. Negative for dizziness, seizures and headaches.  Psychiatric/Behavioral: Negative.  The patient is not nervous/anxious.     As per HPI. Otherwise, a complete review of systems is negative.  PAST MEDICAL HISTORY: Past Medical History:  Diagnosis Date   Aortic atherosclerosis ([place])    Arthritis    Atrial fibrillation ([place])    a.) s/p TEE with cardioversion on 02/04/2020. b.) on daily apixaban.   Bladder mass    a.) CT 04/08/2021 --> 3.2 cm intraluminal bladder mass.   Carotid  stenosis, bilateral    a.) Doppler 07/04/2020 --> mild; 1-49% stenosis BILATERALLY.   Cholelithiasis    a.) CT 11/06/2020 --> largest measured 4.5 cm   Chronic anticoagulation    a.) Apixaban   Common biliary duct calculus    a.) CT 04/08/2021 --> CBD dilated at 8 mm; 71m calculus within distal CBD.   Coronary artery disease involving native coronary artery of native heart without angina pectoris 53/50/0938  Diastolic dysfunction    a.) TTE 07/04/2020 --> LVEF 60-65%; G1DD.   Hepatic cirrhosis ([facility])    Hepatic steatosis    Hepatitis 1970   History of 2019 novel coronavirus disease (COVID-19) 12/20/2019   History of marijuana use    Hypertension    Hypertensive retinopathy    IDA (iron deficiency anemia)    Mitral stenosis    a.) TEE 04/10/2020 --> mild. b.) TTE 07/04/2020 --> EF 60-65%; mild (mean gradient 5 mmHg).   Moderate pulmonary arterial systolic hypertension ([place] 12/22/2019   Nephrolithiasis    NSTEMI (non-ST elevated myocardial infarction) ([place] 07/26/2016   Open-angle glaucoma 09/11/2010   PAH [place] artery [place]) [place]    a.) TTE 12/21/2019 --> PASP 44 mmHg.   Uterine fibroid    a.) CT 04/08/2021 --> multiple with largest measuring 7 cm.   Valvular regurgitation    a.) TTE 09/01/2010 --> trivial to mild pan-valvular. b.) TTE 12/21/2019 --> mild MR and AR; moderate TR. c.) TTE 07/04/2020 --> mild TR; trivial MR and PR.   Vestibular neuronitis     PAST SURGICAL HISTORY: Past Surgical History:  Procedure Laterality Date   BREAST CYST ASPIRATION Left  COLONOSCOPY  2012   ERCP N/A 12/18/2021   Procedure: ENDOSCOPIC RETROGRADE CHOLANGIOPANCREATOGRAPHY (ERCP);  Surgeon: Lucilla Lame, MD;  Location[facility];  Service: Endoscopy;  Laterality: N/A;   TEE WITH CARDIOVERSION N/A 02/04/2020   Procedure: TEE WITH CARDIOVERSION; Location: [facility]; Surgeon: Kandis Cocking, MD   TRANSURETHRAL RESECTION OF BLADDER TUMOR N/A 05/15/2021   Procedure: TRANSURETHRAL RESECTION  OF BLADDER TUMOR (TURBT);  Surgeon: Billey Co, MD;  Location: [facility] ORS;  Service: Urology;  Laterality: N/A;    FAMILY HISTORY: Family History  Problem Relation Age of Onset   Aneurysm Mother    Colon cancer Father    Lung cancer Brother    Breast cancer Neg Hx     ADVANCED DIRECTIVES (Y/N):  N  HEALTH MAINTENANCE: Social History   Tobacco Use   Smoking status: Former    Packs/day: 0.10    Years: 10.00    Total pack years: 1.00    Types: Cigarettes    Passive exposure: Past   Smokeless tobacco: Never   Tobacco comments:    occasional smoker  Vaping Use   Vaping Use: Never used  Substance Use Topics   Alcohol use: Yes    Alcohol/week: 5.0 standard drinks of alcohol    Types: 5 Cans of beer per week    Comment: weekly   Drug use: Not Currently    Types: 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     atorvastatin (LIPITOR) 40 MG tablet Take 40 mg by mouth at bedtime.     diltiazem (CARDIZEM CD) 120 MG 24 hr capsule Take 1 capsule (120 mg total) by mouth daily. 30 capsule 0   ELIQUIS 5 MG TABS tablet Take 1 tablet (5 mg total) by mouth 2 (two) times daily. 60 tablet 1   magnesium oxide (MAG-OX) 400 MG tablet Take 1 tablet by mouth 2 (two) times daily.     metoprolol succinate (TOPROL-XL) 25 MG 24 hr tablet Take 1 tablet (25 mg total) by mouth 2 (two) times daily. Take with or immediately following a meal. 60 tablet 0   Multiple Vitamin (MULTIVITAMIN WITH MINERALS) TABS tablet Take 1 tablet by mouth daily. 90 tablet 0   trolamine salicylate (ASPERCREME) 10 % cream Apply 1 application topically as needed for muscle pain.     traMADol (ULTRAM) 50 MG tablet Take One tab PO Q6  hours PRN pain (Patient not taking: Reported on 04/11/2022)     No current facility-administered medications for this visit.    OBJECTIVE: Vitals:   05/11/22 1344  BP: (!) 157/111  Pulse: (!) 111  Temp: (!) 97.5 F (36.4 C)     Body mass index is 25.45 kg/m.    ECOG FS:2 - Symptomatic, <50% confined to bed  General: Thin, no acute distress.  Sitting in a wheelchair. Eyes: Pink conjunctiva, anicteric sclera. HEENT: Normocephalic, moist mucous membranes. Lungs: No audible wheezing or coughing. Heart: Regular rate and rhythm. Abdomen: Soft, nontender, no obvious distention. Musculoskeletal: No edema, cyanosis, or clubbing. Neuro: Alert, answering all questions appropriately. Cranial nerves grossly intact. Skin: No rashes or petechiae noted. Psych: Normal affect. Lymphatics: Easily palpable right inguinal lymph node.  LAB RESULTS:  Lab Results  Component Value Date   NA 135 04/13/2022   K 4.0 04/13/2022   CL 108 04/13/2022   CO2 22 04/13/2022   GLUCOSE 85 04/13/2022   BUN 14 04/13/2022   CREATININE 0.72 04/13/2022   CALCIUM 7.8 (L) 04/13/2022   PROT 6.7 04/13/2022   ALBUMIN 2.2 (L) 04/13/2022   AST 39 04/13/2022   ALT 15 04/13/2022   ALKPHOS 39 04/13/2022   BILITOT 1.1 04/13/2022   GFRNONAA >60 04/13/2022   GFRAA >60 03/07/2018    Lab Results  Component Value Date   WBC 4.6 04/14/2022   NEUTROABS 9.8 (H) 04/11/2022   HGB 9.9 (L) 04/14/2022   HCT 30.0 (L) 04/14/2022   MCV 106.8 (H) 04/14/2022   PLT 161 04/14/2022     STUDIES: MR 3D Recon At Scanner  Result Date: 04/12/2022 CLINICAL DATA:  Cholelithiasis right upper quadrant pain, cirrhosis, history of bladder cancer EXAM: MRI ABDOMEN WITHOUT CONTRAST  (INCLUDING MRCP) TECHNIQUE: Multiplanar multisequence MR imaging of the abdomen was performed. Heavily T2-weighted images of the biliary and pancreatic ducts were obtained, and three-dimensional MRCP images were rendered by post processing. COMPARISON:  Same-day CT  abdomen pelvis FINDINGS: Lower chest:  Cardiomegaly.  Trace pleural effusions. Hepatobiliary: Coarse, nodular cirrhotic morphology of the liver. Numerous subtly T2 hyperintense, somewhat ill-defined lesions throughout the liver parenchyma, for example in the posterior liver dome, hepatic segment VII, measuring up to 1.4 x 1.1 cm (series 9, image 6) and in the inferior left lobe of the liver, hepatic segment III, measuring 3.2 x 2.4 cm (series 9, image 19). Sludge and gallstones in the gallbladder, including a large gallstone measuring at least 4.8 cm and multiple small gallstones near the gallbladder neck (series 4, image 15). Gallbladder wall thickening and pericholecystic fluid (series 4, image 20). Intra and extrahepatic biliary ductal dilatation without obstructing calculus identified to the ampulla, common bile duct measuring up to 1.0 cm in caliber (series 3, image 15). Pancreas: Unremarkable. No pancreatic ductal dilatation or surrounding inflammatory changes. Spleen: Normal in size without significant abnormality. Adrenals/Urinary Tract: Adrenal glands are unremarkable. Kidneys are normal, without renal calculi, solid lesion, or hydronephrosis. Stomach/Bowel: Stomach is within normal limits. No evidence of bowel wall thickening, distention, or inflammatory changes. Vascular/Lymphatic: No significant vascular findings are present. Bulky, matted appearing gastrohepatic ligament, porta hepatis, and retroperitoneal lymph nodes, largest porta hepatis node measuring 2.9 x 1.9 cm (series 9, image 15) Other: No abdominal wall hernia. Anasarca. Small volume ascites. Partially imaged bulky uterine fibroids (series 3, image 13). Musculoskeletal: No acute or significant osseous findings. IMPRESSION: 1. Sludge and gallstones in the gallbladder, including a large gallstone measuring at least 4.8 cm and multiple small gallstones near the gallbladder neck. Gallbladder wall thickening and pericholecystic fluid. Findings are  concerning for acute cholecystitis. 2. Intra and extrahepatic biliary ductal dilatation without obstructing calculus or other lesion identified to the ampulla. 3. Numerous subtly T2 hyperintense, somewhat ill-defined lesions throughout the liver parenchyma, highly suspicious for hepatic metastases although incompletely characterized by noncontrast MR. 4. Bulky, matted appearing abdominal lymph nodes, consistent with nodal metastatic disease. 5. Cirrhosis and ascites. 6. Partially imaged bulky uterine fibroids. Electronically Signed   By: Delanna Ahmadi M.D.   On: 04/12/2022 05:55   MR ABDOMEN MRCP WO CONTRAST  Result Date: 04/11/2022 CLINICAL DATA:  Cholelithiasis right upper quadrant pain, cirrhosis, history of bladder cancer EXAM: MRI ABDOMEN WITHOUT CONTRAST  (INCLUDING MRCP) TECHNIQUE: Multiplanar multisequence MR imaging of the abdomen was  performed. Heavily T2-weighted images of the biliary and pancreatic ducts were obtained, and three-dimensional MRCP images were rendered by post processing. COMPARISON:  Same-day CT abdomen pelvis FINDINGS: Lower chest:  Cardiomegaly.  Trace pleural effusions. Hepatobiliary: Coarse, nodular cirrhotic morphology of the liver. Numerous subtly T2 hyperintense, somewhat ill-defined lesions throughout the liver parenchyma, for example in the posterior liver dome, hepatic segment VII, measuring up to 1.4 x 1.1 cm (series 9, image 6) and in the inferior left lobe of the liver, hepatic segment III, measuring 3.2 x 2.4 cm (series 9, image 19). Sludge and gallstones in the gallbladder, including a large gallstone measuring at least 4.8 cm and multiple small gallstones near the gallbladder neck (series 4, image 15). Gallbladder wall thickening and pericholecystic fluid (series 4, image 20). Intra and extrahepatic biliary ductal dilatation without obstructing calculus identified to the ampulla, common bile duct measuring up to 1.0 cm in caliber (series 3, image 15). Pancreas:  Unremarkable. No pancreatic ductal dilatation or surrounding inflammatory changes. Spleen: Normal in size without significant abnormality. Adrenals/Urinary Tract: Adrenal glands are unremarkable. Kidneys are normal, without renal calculi, solid lesion, or hydronephrosis. Stomach/Bowel: Stomach is within normal limits. No evidence of bowel wall thickening, distention, or inflammatory changes. Vascular/Lymphatic: No significant vascular findings are present. Bulky, matted appearing gastrohepatic ligament, porta hepatis, and retroperitoneal lymph nodes, largest porta hepatis node measuring 2.9 x 1.9 cm (series 9, image 15) Other: No abdominal wall hernia. Anasarca. Small volume ascites. Partially imaged bulky uterine fibroids (series 3, image 13). Musculoskeletal: No acute or significant osseous findings. IMPRESSION: 1. Sludge and gallstones in the gallbladder, including a large gallstone measuring at least 4.8 cm and multiple small gallstones near the gallbladder neck. Gallbladder wall thickening and pericholecystic fluid. Findings are concerning for acute cholecystitis. 2. Intra and extrahepatic biliary ductal dilatation without obstructing calculus or other lesion identified to the ampulla. 3. Numerous subtly T2 hyperintense, somewhat ill-defined lesions throughout the liver parenchyma, highly suspicious for hepatic metastases although incompletely characterized by noncontrast MR. 4. Bulky, matted appearing abdominal lymph nodes, consistent with nodal metastatic disease. 5. Cirrhosis and ascites. 6. Partially imaged bulky uterine fibroids. Electronically Signed   By: Delanna Ahmadi M.D.   On: 04/11/2022 21:14    ASSESSMENT: Stage II bladder cancer.  PLAN:    Stage II bladder cancer: Pathology and imaging reviewed independently.  PET scan results from June 18, 2021 reviewed independently with no specific findings of active malignancy.  Since patient is not a surgical candidate she underwent TURBT.  Patient  did not have port placement.   Patient received her fourth and last dose of cisplatin on August 04, 2021.  She subsequently completed XRT on August 19, 2021.  Patient had CT scan on Dec 20, 2021 that revealed mild abdominal lymphadenopathy.  Cystoscopy on January 07, 2022 did not reveal any evidence of recurrence.  Repeat imaging as above revealed significant increase in lymphadenopathy as well as lesions in liver suggestive of some metastasis.  Both CEA and AFP are elevated, we will repeat these today.  Plan to get a PET scan in the next 1 to 2 weeks to further evaluate.  Can also consider biopsy of right inguinal node to confirm recurrence.  Patient will return to clinic 1 to 2 days after PET scan to discuss the results and additional diagnostic planning.  Patient also was evaluated by palliative care today.   Hypomagnesia: Chronic and unchanged.  Patient previously refused IV supplementation.  Continue oral magnesium supplementation.   Neutropenia:  Resolve Anemia: Chronic unchanged.  Patient's hemoglobin is 9.9 today.   Thrombocytopenia: Resolved. A-fib: Patient states that she had a referral to cardiology, but did not keep this appointment.   Hypokalemia: Resolved.  Continue oral supplementation.   Patient expressed understanding and was in agreement with this plan. She also understands that She can call clinic at any time with any questions, concerns, or complaints.    Cancer Staging  Bladder cancer [facility]) Staging form: Urinary Bladder, AJCC 8th Edition - Clinical stage from 06/04/2021: Stage II (cT2, cN0, cM0) - Signed by Lloyd Huger, MD on 06/04/2021 WHO/ISUP grade (low/high): High Grade Histologic grading system: 2 grade system  Lloyd Huger, MD   05/11/2022 2:36 PM

## 2022-05-11 ENCOUNTER — Ambulatory Visit: Payer: Medicare Other | Admitting: Oncology

## 2022-05-11 ENCOUNTER — Encounter: Payer: Self-pay | Admitting: Oncology

## 2022-05-11 ENCOUNTER — Inpatient Hospital Stay: Payer: Medicare Other

## 2022-05-11 ENCOUNTER — Inpatient Hospital Stay: Payer: Medicare Other | Attending: Oncology | Admitting: Oncology

## 2022-05-11 ENCOUNTER — Inpatient Hospital Stay (HOSPITAL_BASED_OUTPATIENT_CLINIC_OR_DEPARTMENT_OTHER): Payer: Medicare Other | Admitting: Hospice and Palliative Medicine

## 2022-05-11 VITALS — BP 157/111 | HR 111 | Temp 97.5°F | Wt 126.0 lb

## 2022-05-11 DIAGNOSIS — I5030 Unspecified diastolic (congestive) heart failure: Secondary | ICD-10-CM | POA: Diagnosis not present

## 2022-05-11 DIAGNOSIS — C7951 Secondary malignant neoplasm of bone: Secondary | ICD-10-CM | POA: Diagnosis not present

## 2022-05-11 DIAGNOSIS — C787 Secondary malignant neoplasm of liver and intrahepatic bile duct: Secondary | ICD-10-CM | POA: Diagnosis not present

## 2022-05-11 DIAGNOSIS — Z87891 Personal history of nicotine dependence: Secondary | ICD-10-CM | POA: Diagnosis not present

## 2022-05-11 DIAGNOSIS — R197 Diarrhea, unspecified: Secondary | ICD-10-CM

## 2022-05-11 DIAGNOSIS — Z923 Personal history of irradiation: Secondary | ICD-10-CM | POA: Diagnosis not present

## 2022-05-11 DIAGNOSIS — I4891 Unspecified atrial fibrillation: Secondary | ICD-10-CM | POA: Diagnosis not present

## 2022-05-11 DIAGNOSIS — I110 Hypertensive heart disease with heart failure: Secondary | ICD-10-CM | POA: Diagnosis not present

## 2022-05-11 DIAGNOSIS — Z801 Family history of malignant neoplasm of trachea, bronchus and lung: Secondary | ICD-10-CM | POA: Insufficient documentation

## 2022-05-11 DIAGNOSIS — C778 Secondary and unspecified malignant neoplasm of lymph nodes of multiple regions: Secondary | ICD-10-CM | POA: Insufficient documentation

## 2022-05-11 DIAGNOSIS — D709 Neutropenia, unspecified: Secondary | ICD-10-CM | POA: Insufficient documentation

## 2022-05-11 DIAGNOSIS — D649 Anemia, unspecified: Secondary | ICD-10-CM | POA: Diagnosis not present

## 2022-05-11 DIAGNOSIS — E876 Hypokalemia: Secondary | ICD-10-CM | POA: Insufficient documentation

## 2022-05-11 DIAGNOSIS — R932 Abnormal findings on diagnostic imaging of liver and biliary tract: Secondary | ICD-10-CM | POA: Insufficient documentation

## 2022-05-11 DIAGNOSIS — Z800 Family history of malignant neoplasm of digestive organs: Secondary | ICD-10-CM | POA: Insufficient documentation

## 2022-05-11 DIAGNOSIS — R978 Other abnormal tumor markers: Secondary | ICD-10-CM | POA: Insufficient documentation

## 2022-05-11 DIAGNOSIS — C679 Malignant neoplasm of bladder, unspecified: Secondary | ICD-10-CM | POA: Diagnosis present

## 2022-05-11 DIAGNOSIS — C678 Malignant neoplasm of overlapping sites of bladder: Secondary | ICD-10-CM | POA: Diagnosis not present

## 2022-05-11 LAB — COMPREHENSIVE METABOLIC PANEL
ALT: 16 U/L (ref 0–44)
AST: 50 U/L — ABNORMAL HIGH (ref 15–41)
Albumin: 2.7 g/dL — ABNORMAL LOW (ref 3.5–5.0)
Alkaline Phosphatase: 53 U/L (ref 38–126)
Anion gap: 8 (ref 5–15)
BUN: 9 mg/dL (ref 8–23)
CO2: 21 mmol/L — ABNORMAL LOW (ref 22–32)
Calcium: 8.1 mg/dL — ABNORMAL LOW (ref 8.9–10.3)
Chloride: 108 mmol/L (ref 98–111)
Creatinine, Ser: 0.74 mg/dL (ref 0.44–1.00)
GFR, Estimated: >60 mL/min (ref >60)
Glucose, Bld: 89 mg/dL (ref 70–99)
Potassium: 3.1 mmol/L — ABNORMAL LOW (ref 3.5–5.1)
Sodium: 137 mmol/L (ref 135–145)
Total Bilirubin: 1.1 mg/dL (ref 0.3–1.2)
Total Protein: 7.9 g/dL (ref 6.5–8.1)

## 2022-05-11 LAB — CBC WITH DIFFERENTIAL/PLATELET
Abs Immature Granulocytes: 0 10*3/uL (ref 0.00–0.07)
Basophils Absolute: 0 10*3/uL (ref 0.0–0.1)
Basophils Relative: 1 %
Eosinophils Absolute: 0 10*3/uL (ref 0.0–0.5)
Eosinophils Relative: 2 %
HCT: 32.3 % — ABNORMAL LOW (ref 36.0–46.0)
Hemoglobin: 10.8 g/dL — ABNORMAL LOW (ref 12.0–15.0)
Immature Granulocytes: 0 %
Lymphocytes Relative: 24 %
Lymphs Abs: 0.6 10*3/uL — ABNORMAL LOW (ref 0.7–4.0)
MCH: 34.7 pg — ABNORMAL HIGH (ref 26.0–34.0)
MCHC: 33.4 g/dL (ref 30.0–36.0)
MCV: 103.9 fL — ABNORMAL HIGH (ref 80.0–100.0)
Monocytes Absolute: 0.1 10*3/uL (ref 0.1–1.0)
Monocytes Relative: 5 %
Neutro Abs: 1.6 10*3/uL — ABNORMAL LOW (ref 1.7–7.7)
Neutrophils Relative %: 68 %
Platelets: 143 10*3/uL — ABNORMAL LOW (ref 150–400)
RBC: 3.11 MIL/uL — ABNORMAL LOW (ref 3.87–5.11)
RDW: 14.4 % (ref 11.5–15.5)
WBC: 2.3 10*3/uL — ABNORMAL LOW (ref 4.0–10.5)
nRBC: 0 % (ref 0.0–0.2)

## 2022-05-11 NOTE — Progress Notes (Signed)
[place] at [facility] [phone] [phone]) [phone]   Name: Kristina Ellis Date: 05/11/2022 MRN: [id number]  DOB: Dec 01, 1950  Patient Care Team: [facility], [place] as PCP - General (General Practice) Lloyd Huger, MD as Consulting Physician (Hematology and Oncology)    REASON FOR CONSULTATION: Kristina Ellis is a 71 y.o. female with multiple medical problems including A-fib, cholelithiasis, diastolic CHF, and pulmonary hypertension.  Patient has history of bladder cancer status post TURBT on 05/15/2021.  She declined cystectomy and instead opted for chemotherapy and radiation.  Patient was hospitalized 04/11/2022 to 04/14/2022 initially thought to have cholecystitis but imaging including CT and MRI revealed ill-defined lesions throughout the liver suspicious for metastatic disease.  Additionally, patient had elevated CEA and AFP.  SOCIAL HISTORY:     reports that she has quit smoking. Her smoking use included cigarettes. She has a 1.00 pack-year smoking history. She has been exposed to tobacco smoke. She has never used smokeless tobacco. She reports current alcohol use of about 5.0 standard drinks of alcohol per week. She reports that she does not currently use drugs after having used the following drugs: Marijuana.  Patient is unmarried.  She lives at home with her son.  Patient retired from a Writer.  ADVANCE DIRECTIVES:    CODE STATUS:   PAST MEDICAL HISTORY: Past Medical History:  Diagnosis Date   Aortic atherosclerosis ([place])    Arthritis    Atrial fibrillation ([place])    a.) s/p TEE with cardioversion on 02/04/2020. b.) on daily apixaban.   Bladder mass    a.) CT 04/08/2021 --> 3.2 cm intraluminal bladder mass.   Carotid stenosis, bilateral    a.) Doppler 07/04/2020 --> mild; 1-49% stenosis BILATERALLY.   Cholelithiasis    a.) CT 11/06/2020 --> largest measured 4.5 cm   Chronic  anticoagulation    a.) Apixaban   Common biliary duct calculus    a.) CT 04/08/2021 --> CBD dilated at 8 mm; 59m calculus within distal CBD.   Coronary artery disease involving native coronary artery of native heart without angina pectoris 56/37/8588  Diastolic dysfunction    a.) TTE 07/04/2020 --> LVEF 60-65%; G1DD.   Hepatic cirrhosis (HCC)    Hepatic steatosis    Hepatitis 1970   History of 2019 novel coronavirus disease (COVID-19) 12/20/2019   History of marijuana use    Hypertension    Hypertensive retinopathy    IDA (iron deficiency anemia)    Mitral stenosis    a.) TEE 04/10/2020 --> mild. b.) TTE 07/04/2020 --> EF 60-65%; mild (mean gradient 5 mmHg).   Moderate pulmonary arterial systolic hypertension ([place] 12/22/2019   Nephrolithiasis    NSTEMI (non-ST elevated myocardial infarction) ([place] 07/26/2016   Open-angle glaucoma 09/11/2010   PAH (pulmonary artery hypertension) [place]    a.) TTE 12/21/2019 --> PASP 44 mmHg.   Uterine fibroid    a.) CT 04/08/2021 --> multiple with largest measuring 7 cm.   Valvular regurgitation    a.) TTE 09/01/2010 --> trivial to mild pan-valvular. b.) TTE 12/21/2019 --> mild MR and AR; moderate TR. c.) TTE 07/04/2020 --> mild TR; trivial MR and PR.   Vestibular neuronitis     PAST SURGICAL HISTORY:  Past Surgical History:  Procedure Laterality Date   BREAST CYST ASPIRATION Left    COLONOSCOPY  2012   ERCP N/A 12/18/2021   Procedure: ENDOSCOPIC RETROGRADE CHOLANGIOPANCREATOGRAPHY (ERCP);  Surgeon: WLucilla Lame MD;  Location:  [facility];  Service: Endoscopy;  Laterality: N/A;   TEE WITH CARDIOVERSION N/A 02/04/2020   Procedure: TEE WITH CARDIOVERSION; Location: [facility]; Surgeon: Kandis Cocking, MD   TRANSURETHRAL RESECTION OF BLADDER TUMOR N/A 05/15/2021   Procedure: TRANSURETHRAL RESECTION OF BLADDER TUMOR (TURBT);  Surgeon: Billey Co, MD;  Location: [facility];  Service: Urology;  Laterality: N/A;    HEMATOLOGY/ONCOLOGY HISTORY:  Oncology  History  Bladder cancer ([place])  05/29/2021 Initial Diagnosis   Bladder cancer ([place])   06/04/2021 Cancer Staging   Staging form: Urinary Bladder, AJCC 8th Edition - Clinical stage from 06/04/2021: Stage II (cT2, cN0, cM0) - Signed by Lloyd Huger, MD on 06/04/2021 WHO/ISUP grade (low/high): High Grade Histologic grading system: 2 grade system   06/23/2021 - 08/04/2021 Chemotherapy   Patient is on Treatment Plan : BLADDER Cisplatin q7d + XRT       ALLERGIES:  is allergic to atenolol and penicillins.  MEDICATIONS:  Current Outpatient Medications  Medication Sig Dispense Refill   amiodarone (PACERONE) 200 MG tablet Take by mouth.     atorvastatin (LIPITOR) 40 MG tablet Take 40 mg by mouth at bedtime.     diltiazem (CARDIZEM CD) 120 MG 24 hr capsule Take 1 capsule (120 mg total) by mouth daily. 30 capsule 0   ELIQUIS 5 MG TABS tablet Take 1 tablet (5 mg total) by mouth 2 (two) times daily. 60 tablet 1   magnesium oxide (MAG-OX) 400 MG tablet Take 1 tablet by mouth 2 (two) times daily.     metoprolol succinate (TOPROL-XL) 25 MG 24 hr tablet Take 1 tablet (25 mg total) by mouth 2 (two) times daily. Take with or immediately following a meal. 60 tablet 0   Multiple Vitamin (MULTIVITAMIN WITH MINERALS) TABS tablet Take 1 tablet by mouth daily. 90 tablet 0   traMADol (ULTRAM) 50 MG tablet Take One tab PO Q6 hours PRN pain (Patient not taking: Reported on 04/11/2022)     trolamine salicylate (ASPERCREME) 10 % cream Apply 1 application topically as needed for muscle pain.     No current facility-administered medications for this visit.    VITAL SIGNS: There were no vitals taken for this visit. There were no vitals filed for this visit.  Estimated body mass index is 25.45 kg/m as calculated from the following:   Height as of 04/12/22: _0  (1.499 m).   Weight as of an earlier encounter on 05/11/22: 126 lb (57.2 kg).  LABS: CBC:    Component Value Date/Time   WBC 4.6 04/14/2022 0600    HGB 9.9 (L) 04/14/2022 0600   HCT 30.0 (L) 04/14/2022 0600   PLT 161 04/14/2022 0600   MCV 106.8 (H) 04/14/2022 0600   NEUTROABS 9.8 (H) 04/11/2022 1222   LYMPHSABS 0.9 04/11/2022 1222   MONOABS 0.5 04/11/2022 1222   EOSABS 0.0 04/11/2022 1222   BASOSABS 0.0 04/11/2022 1222   Comprehensive Metabolic Panel:    Component Value Date/Time   NA 135 04/13/2022 0459   K 4.0 04/13/2022 0459   CL 108 04/13/2022 0459   CO2 22 04/13/2022 0459   BUN 14 04/13/2022 0459   CREATININE 0.72 04/13/2022 0459   GLUCOSE 85 04/13/2022 0459   CALCIUM 7.8 (L) 04/13/2022 0459   AST 39 04/13/2022 0459   ALT 15 04/13/2022 0459   ALKPHOS 39 04/13/2022 0459   BILITOT 1.1 04/13/2022 0459   PROT 6.7 04/13/2022 0459   ALBUMIN 2.2 (L) 04/13/2022 0459    RADIOGRAPHIC STUDIES: MR 3D Recon  At Scanner  Result Date: 04/12/2022 CLINICAL DATA:  Cholelithiasis right upper quadrant pain, cirrhosis, history of bladder cancer EXAM: MRI ABDOMEN WITHOUT CONTRAST  (INCLUDING MRCP) TECHNIQUE: Multiplanar multisequence MR imaging of the abdomen was performed. Heavily T2-weighted images of the biliary and pancreatic ducts were obtained, and three-dimensional MRCP images were rendered by post processing. COMPARISON:  Same-day CT abdomen pelvis FINDINGS: Lower chest:  Cardiomegaly.  Trace pleural effusions. Hepatobiliary: Coarse, nodular cirrhotic morphology of the liver. Numerous subtly T2 hyperintense, somewhat ill-defined lesions throughout the liver parenchyma, for example in the posterior liver dome, hepatic segment VII, measuring up to 1.4 x 1.1 cm (series 9, image 6) and in the inferior left lobe of the liver, hepatic segment III, measuring 3.2 x 2.4 cm (series 9, image 19). Sludge and gallstones in the gallbladder, including a large gallstone measuring at least 4.8 cm and multiple small gallstones near the gallbladder neck (series 4, image 15). Gallbladder wall thickening and pericholecystic fluid (series 4, image 20). Intra  and extrahepatic biliary ductal dilatation without obstructing calculus identified to the ampulla, common bile duct measuring up to 1.0 cm in caliber (series 3, image 15). Pancreas: Unremarkable. No pancreatic ductal dilatation or surrounding inflammatory changes. Spleen: Normal in size without significant abnormality. Adrenals/Urinary Tract: Adrenal glands are unremarkable. Kidneys are normal, without renal calculi, solid lesion, or hydronephrosis. Stomach/Bowel: Stomach is within normal limits. No evidence of bowel wall thickening, distention, or inflammatory changes. Vascular/Lymphatic: No significant vascular findings are present. Bulky, matted appearing gastrohepatic ligament, porta hepatis, and retroperitoneal lymph nodes, largest porta hepatis node measuring 2.9 x 1.9 cm (series 9, image 15) Other: No abdominal wall hernia. Anasarca. Small volume ascites. Partially imaged bulky uterine fibroids (series 3, image 13). Musculoskeletal: No acute or significant osseous findings. IMPRESSION: 1. Sludge and gallstones in the gallbladder, including a large gallstone measuring at least 4.8 cm and multiple small gallstones near the gallbladder neck. Gallbladder wall thickening and pericholecystic fluid. Findings are concerning for acute cholecystitis. 2. Intra and extrahepatic biliary ductal dilatation without obstructing calculus or other lesion identified to the ampulla. 3. Numerous subtly T2 hyperintense, somewhat ill-defined lesions throughout the liver parenchyma, highly suspicious for hepatic metastases although incompletely characterized by noncontrast MR. 4. Bulky, matted appearing abdominal lymph nodes, consistent with nodal metastatic disease. 5. Cirrhosis and ascites. 6. Partially imaged bulky uterine fibroids. Electronically Signed   By: Delanna Ahmadi M.D.   On: 04/12/2022 05:55   MR ABDOMEN MRCP WO CONTRAST  Result Date: 04/11/2022 CLINICAL DATA:  Cholelithiasis right upper quadrant pain, cirrhosis,  history of bladder cancer EXAM: MRI ABDOMEN WITHOUT CONTRAST  (INCLUDING MRCP) TECHNIQUE: Multiplanar multisequence MR imaging of the abdomen was performed. Heavily T2-weighted images of the biliary and pancreatic ducts were obtained, and three-dimensional MRCP images were rendered by post processing. COMPARISON:  Same-day CT abdomen pelvis FINDINGS: Lower chest:  Cardiomegaly.  Trace pleural effusions. Hepatobiliary: Coarse, nodular cirrhotic morphology of the liver. Numerous subtly T2 hyperintense, somewhat ill-defined lesions throughout the liver parenchyma, for example in the posterior liver dome, hepatic segment VII, measuring up to 1.4 x 1.1 cm (series 9, image 6) and in the inferior left lobe of the liver, hepatic segment III, measuring 3.2 x 2.4 cm (series 9, image 19). Sludge and gallstones in the gallbladder, including a large gallstone measuring at least 4.8 cm and multiple small gallstones near the gallbladder neck (series 4, image 15). Gallbladder wall thickening and pericholecystic fluid (series 4, image 20). Intra and extrahepatic biliary ductal dilatation without obstructing  calculus identified to the ampulla, common bile duct measuring up to 1.0 cm in caliber (series 3, image 15). Pancreas: Unremarkable. No pancreatic ductal dilatation or surrounding inflammatory changes. Spleen: Normal in size without significant abnormality. Adrenals/Urinary Tract: Adrenal glands are unremarkable. Kidneys are normal, without renal calculi, solid lesion, or hydronephrosis. Stomach/Bowel: Stomach is within normal limits. No evidence of bowel wall thickening, distention, or inflammatory changes. Vascular/Lymphatic: No significant vascular findings are present. Bulky, matted appearing gastrohepatic ligament, porta hepatis, and retroperitoneal lymph nodes, largest porta hepatis node measuring 2.9 x 1.9 cm (series 9, image 15) Other: No abdominal wall hernia. Anasarca. Small volume ascites. Partially imaged bulky  uterine fibroids (series 3, image 13). Musculoskeletal: No acute or significant osseous findings. IMPRESSION: 1. Sludge and gallstones in the gallbladder, including a large gallstone measuring at least 4.8 cm and multiple small gallstones near the gallbladder neck. Gallbladder wall thickening and pericholecystic fluid. Findings are concerning for acute cholecystitis. 2. Intra and extrahepatic biliary ductal dilatation without obstructing calculus or other lesion identified to the ampulla. 3. Numerous subtly T2 hyperintense, somewhat ill-defined lesions throughout the liver parenchyma, highly suspicious for hepatic metastases although incompletely characterized by noncontrast MR. 4. Bulky, matted appearing abdominal lymph nodes, consistent with nodal metastatic disease. 5. Cirrhosis and ascites. 6. Partially imaged bulky uterine fibroids. Electronically Signed   By: Delanna Ahmadi M.D.   On: 04/11/2022 21:14   CT ABDOMEN PELVIS W CONTRAST  Result Date: 04/11/2022 CLINICAL DATA:  Pain right upper quadrant, diarrhea EXAM: CT ABDOMEN AND PELVIS WITH CONTRAST TECHNIQUE: Multidetector CT imaging of the abdomen and pelvis was performed using the standard protocol following bolus administration of intravenous contrast. RADIATION DOSE REDUCTION: This exam was performed according to the departmental dose-optimization program which includes automated exposure control, adjustment of the mA and/or kV according to patient size and/or use of iterative reconstruction technique. CONTRAST:  152m OMNIPAQUE IOHEXOL 300 MG/ML  SOLN COMPARISON:  Previous studies including CT done on 12/20/2021 and sonogram done on 04/11/2022 FINDINGS: Lower chest: No focal infiltrates are seen in visualized lower lung fields. Heart is enlarged in size. Dense calcification is seen in mitral annulus. Scattered coronary artery calcifications are seen. Hepatobiliary: There is fatty infiltration in liver. In image 12 of series 3, there is 1.3 cm  inhomogeneous enhancing lesion in the right lobe. There are other smaller enhancing lesions in the liver. There is nodularity of the liver surface suggesting possible cirrhosis. There is no dilation of bile ducts. Nausea gallbladder stones are seen. There is no significant wall thickening in gallbladder. There is fluid around the gallbladder which may be part of ascites. Pancreas: There is atrophy. There is dilation of pancreatic duct. No focal abnormalities are seen. Spleen: Unremarkable. Adrenals/Urinary Tract: Adrenals are unremarkable. There is no hydronephrosis. There are no renal or ureteral stones. The 7 mm low-density lesion in the lower pole of left kidney, possibly a cyst. Urinary bladder is unremarkable. Stomach/Bowel: Stomach is not distended. Small bowel loops are not dilated. The appendix is not dilated. There is no significant focal wall thickening in colon. There is no pericolic stranding. Vascular/Lymphatic: Extensive arterial calcifications are seen. There are abnormal enlarged lymph nodes in retroperitoneum measuring up to 2.2 cm in diameter. There is interval increase in number and size of retroperitoneal lymph nodes suggesting progression of metastatic disease. There is a new large lymph node measuring 3.4 x 2.2 cm in size slightly above the head of the pancreas close to support a bodies suggesting metastatic disease. Reproductive: Uterus  is enlarged with multiple lesions of varying sizes suggesting uterine fibroids. Largest of the fibroids measures 7.5 cm in maximum diameter. There are no adnexal masses. Minimal amount of free fluid is seen in pelvis. Other: There is no pneumoperitoneum. Small umbilical hernia containing fat is seen. Musculoskeletal: Degenerative changes are noted in lumbar spine with disc space narrowing, bony spurs and encroachment of neural foramina at multiple levels. IMPRESSION: There is no evidence of intestinal obstruction or pneumoperitoneum. There is no hydronephrosis.  There is interval appearance of multiple small enhancing lesions in the liver suggesting possible metastatic disease. Cirrhosis of liver. There are enlarged lymph nodes in retroperitoneum and adjacent to porta hepatis with interval increase in size and number suggesting progression of metastatic lymphadenopathy. Large gallbladder stones. Small amount of fluid around the gallbladder is possibly small ascites. Less likely possibility would be acute cholecystitis. There is no wall thickening in gallbladder. There is no significant dilation of bile ducts. Multiple uterine fibroids. Minimal ascites. Other findings as described in the body of the report. Electronically Signed   By: Elmer Picker M.D.   On: 04/11/2022 14:54    PERFORMANCE STATUS (ECOG) : 3 - Symptomatic, >50% confined to bed  Review of Systems Unless otherwise noted, a complete review of systems is negative.  Physical Exam General: NAD Cardiovascular: regular rate and rhythm Pulmonary: clear ant fields Abdomen: soft, nontender, + bowel sounds GU: no suprapubic tenderness Extremities: no edema, no joint deformities Skin: no rashes Neurological: Weakness but otherwise nonfocal  IMPRESSION: I met with patient following her visit with Dr. Grayland Ormond.  I introduced palliative care services and attempted to establish therapeutic rapport.  Work-up including CT/MRI is concerning for recurrent cancer.  Patient has had follow-up with urology but has previously resisted aggressive/interventional procedures.  Today, patient does verbalize consent to proceed with work-up including biopsy and PET scan.  She seems somewhat reluctant to consider systemic treatment such as chemotherapy but states "I guess if if that is what has to be done."  We agreed to discuss goals in more depth following completion of work-up.  Symptomatically, she has had intermittent diarrhea for the past month.  We will send for stool studies and check C. difficile PCR.   Patient will likely require eventual GI work-up.  May utilize as needed antidiarrheals.  PLAN: -Continue current scope of treatment -She will benefit from further conversation regarding goals following completion of work-up -RTC 3 weeks  Case and plan discussed with Dr. Grayland Ormond  Patient expressed understanding and was in agreement with this plan. She also understands that She can call the clinic at any time with any questions, concerns, or complaints.     Time Total: 15 minutes  Visit consisted of counseling and education dealing with the complex and emotionally intense issues of symptom management and palliative care in the setting of serious and potentially life-threatening illness.Greater than 50%  of this time was spent counseling and coordinating care related to the above assessment and plan.  Signed by: Altha Harm, PhD, NP-C

## 2022-05-13 ENCOUNTER — Other Ambulatory Visit: Payer: Self-pay

## 2022-05-13 ENCOUNTER — Other Ambulatory Visit: Payer: Self-pay | Admitting: Hospice and Palliative Medicine

## 2022-05-13 DIAGNOSIS — C679 Malignant neoplasm of bladder, unspecified: Secondary | ICD-10-CM | POA: Diagnosis not present

## 2022-05-13 DIAGNOSIS — R197 Diarrhea, unspecified: Secondary | ICD-10-CM

## 2022-05-13 LAB — CEA: CEA: 28 ng/mL — ABNORMAL HIGH (ref 0.0–4.7)

## 2022-05-13 LAB — C DIFFICILE QUICK SCREEN W PCR REFLEX
C Diff antigen: POSITIVE — AB
C Diff toxin: NEGATIVE

## 2022-05-13 LAB — CLOSTRIDIUM DIFFICILE BY PCR, REFLEXED: Toxigenic C. Difficile by PCR: POSITIVE — AB

## 2022-05-13 LAB — AFP TUMOR MARKER: AFP, Serum, Tumor Marker: 21.5 ng/mL — ABNORMAL HIGH (ref 0.0–9.2)

## 2022-05-13 LAB — CA 125: Cancer Antigen (CA) 125: 32.9 U/mL (ref 0.0–38.1)

## 2022-05-13 MED ORDER — VANCOMYCIN HCL 125 MG PO CAPS: 125.0000 mg | ORAL_CAPSULE | Freq: Four times a day (QID) | ORAL | 0 refills | Status: DC

## 2022-05-13 NOTE — Progress Notes (Signed)
Patient with chronic diarrhea.  C. difficile antigen positive but negative toxin with indeterminate result.  Discussed with Dr. Grayland Ormond who advised proceeding with oral vancomycin and recommended referral to GI for further work-up.

## 2022-05-16 LAB — GI PATHOGEN PANEL BY PCR, STOOL

## 2022-05-26 ENCOUNTER — Ambulatory Visit
Admission: RE | Admit: 2022-05-26 | Discharge: 2022-05-26 | Disposition: A | Discharge status: Home / Self Care | Payer: Medicare Other | Source: Ambulatory Visit | Attending: Oncology | Admitting: Oncology

## 2022-05-26 DIAGNOSIS — I700 Atherosclerosis of aorta: Secondary | ICD-10-CM | POA: Insufficient documentation

## 2022-05-26 DIAGNOSIS — I2510 Atherosclerotic heart disease of native coronary artery without angina pectoris: Secondary | ICD-10-CM | POA: Diagnosis not present

## 2022-05-26 DIAGNOSIS — N200 Calculus of kidney: Secondary | ICD-10-CM | POA: Diagnosis not present

## 2022-05-26 DIAGNOSIS — C787 Secondary malignant neoplasm of liver and intrahepatic bile duct: Secondary | ICD-10-CM | POA: Diagnosis not present

## 2022-05-26 DIAGNOSIS — K7460 Unspecified cirrhosis of liver: Secondary | ICD-10-CM | POA: Insufficient documentation

## 2022-05-26 DIAGNOSIS — C7951 Secondary malignant neoplasm of bone: Secondary | ICD-10-CM | POA: Diagnosis not present

## 2022-05-26 DIAGNOSIS — K8020 Calculus of gallbladder without cholecystitis without obstruction: Secondary | ICD-10-CM | POA: Insufficient documentation

## 2022-05-26 DIAGNOSIS — C678 Malignant neoplasm of overlapping sites of bladder: Secondary | ICD-10-CM | POA: Insufficient documentation

## 2022-05-26 LAB — GLUCOSE, CAPILLARY: Glucose-Capillary: 69 mg/dL — ABNORMAL LOW (ref 70–99)

## 2022-05-26 MED ORDER — FLUDEOXYGLUCOSE F - 18 (FDG) INJECTION
6.5000 | Freq: Once | INTRAVENOUS | Status: AC | PRN
  Administered 2022-05-26: 7.11 via INTRAVENOUS

## 2022-06-01 ENCOUNTER — Encounter: Payer: Self-pay | Admitting: Oncology

## 2022-06-01 ENCOUNTER — Inpatient Hospital Stay (HOSPITAL_BASED_OUTPATIENT_CLINIC_OR_DEPARTMENT_OTHER): Payer: Medicare Other | Admitting: Oncology

## 2022-06-01 ENCOUNTER — Inpatient Hospital Stay (HOSPITAL_BASED_OUTPATIENT_CLINIC_OR_DEPARTMENT_OTHER): Payer: Medicare Other | Admitting: Hospice and Palliative Medicine

## 2022-06-01 VITALS — BP 153/101 | HR 85 | Temp 98.9°F | Wt 126.0 lb

## 2022-06-01 DIAGNOSIS — C679 Malignant neoplasm of bladder, unspecified: Secondary | ICD-10-CM | POA: Diagnosis not present

## 2022-06-01 DIAGNOSIS — C678 Malignant neoplasm of overlapping sites of bladder: Secondary | ICD-10-CM

## 2022-06-01 NOTE — Progress Notes (Signed)
DISCONTINUE ON PATHWAY REGIMEN - Bladder     A cycle is every 7 days:     Cisplatin   **Always confirm dose/schedule in your pharmacy ordering system**  REASON: Disease Progression PRIOR TREATMENT: [id number]: Cisplatin 35 mg/m2 Weekly With Concurrent Pelvic XRT TREATMENT RESPONSE: Progressive Disease (PD)  START ON PATHWAY REGIMEN - Bladder     A cycle is every 21 days:     Pembrolizumab   **Always confirm dose/schedule in your pharmacy ordering system**  Patient Characteristics: Advanced/Metastatic Disease, First Line, Prior Platinum-Based Therapy and Relapse Within 12 Months, FGFR2/FGFR3 Mutation Negative or Unknown, Candidate for PD-1/PD-L1 Inhibitor Therapeutic Status: Advanced/Metastatic Disease Line of Therapy: First Line Prior Therapy and Relapse Status: Prior Platinum-Based Therapy and Relapse Within 12 Months FGFR2/FGFR3 Mutation Status: Did Not Order Test PD-1/PD-L1 Inhibitor Candidate Status: Candidate for PD-1/PD-L1 Inhibitor Intent of Therapy: Non-Curative / Palliative Intent, Discussed with Patient

## 2022-06-01 NOTE — Progress Notes (Signed)
[place] [facility] [phone] [phone]   Name: Kristina Ellis Date: 06/01/2022 MRN: [id number]  DOB: Oct 06, 1950  Patient Care Team: [facility], [place] as PCP - General (General Practice) Lloyd Huger, MD as Consulting Physician (Hematology and Oncology)    REASON FOR CONSULTATION: Kristina Ellis is a 71 y.o. female with multiple medical problems including A-fib, cholelithiasis, diastolic CHF, and pulmonary hypertension.  Patient has history of bladder cancer status post TURBT on 05/15/2021.  She declined cystectomy and instead opted for chemotherapy and radiation.  Patient was hospitalized 04/11/2022 to 04/14/2022 initially thought to have cholecystitis but imaging including CT and MRI revealed ill-defined lesions throughout the liver suspicious for metastatic disease.  Additionally, patient had elevated CEA and AFP.  SOCIAL HISTORY:     reports that she has quit smoking. Her smoking use included cigarettes. She has a 1.00 pack-year smoking history. She has been exposed to tobacco smoke. She has never used smokeless tobacco. She reports current alcohol use of about 5.0 standard drinks of alcohol per week. She reports that she does not currently use drugs after having used the following drugs: Marijuana.  Patient is unmarried.  She lives at home with her son.  Patient retired from a Writer.  ADVANCE DIRECTIVES:    CODE STATUS:   PAST MEDICAL HISTORY: Past Medical History:  Diagnosis Date   Aortic atherosclerosis ([place])    Arthritis    Atrial fibrillation ([place])    a.) s/p TEE with cardioversion on 02/04/2020. b.) on daily apixaban.   Bladder mass    a.) CT 04/08/2021 --> 3.2 cm intraluminal bladder mass.   Carotid stenosis, bilateral    a.) Doppler 07/04/2020 --> mild; 1-49% stenosis BILATERALLY.   Cholelithiasis    a.) CT 11/06/2020 --> largest measured 4.5 cm   Chronic  anticoagulation    a.) Apixaban   Common biliary duct calculus    a.) CT 04/08/2021 --> CBD dilated at 8 mm; 54m calculus within distal CBD.   Coronary artery disease involving native coronary artery of native heart without angina pectoris 52/33/4356  Diastolic dysfunction    a.) TTE 07/04/2020 --> LVEF 60-65%; G1DD.   Hepatic cirrhosis (HCC)    Hepatic steatosis    Hepatitis 1970   History of 2019 novel coronavirus disease (COVID-19) 12/20/2019   History of marijuana use    Hypertension    Hypertensive retinopathy    IDA (iron deficiency anemia)    Mitral stenosis    a.) TEE 04/10/2020 --> mild. b.) TTE 07/04/2020 --> EF 60-65%; mild (mean gradient 5 mmHg).   Moderate pulmonary arterial systolic hypertension ([place] 12/22/2019   Nephrolithiasis    NSTEMI (non-ST elevated myocardial infarction) ([place] 07/26/2016   Open-angle glaucoma 09/11/2010   PAH (pulmonary artery hypertension) ([place]    a.) TTE 12/21/2019 --> PASP 44 mmHg.   Uterine fibroid    a.) CT 04/08/2021 --> multiple with largest measuring 7 cm.   Valvular regurgitation    a.) TTE 09/01/2010 --> trivial to mild pan-valvular. b.) TTE 12/21/2019 --> mild MR and AR; moderate TR. c.) TTE 07/04/2020 --> mild TR; trivial MR and PR.   Vestibular neuronitis     PAST SURGICAL HISTORY:  Past Surgical History:  Procedure Laterality Date   BREAST CYST ASPIRATION Left    COLONOSCOPY  2012   ERCP N/A 12/18/2021   Procedure: ENDOSCOPIC RETROGRADE CHOLANGIOPANCREATOGRAPHY (ERCP);  Surgeon: WLucilla Lame MD;  Location:  [facility];  Service: Endoscopy;  Laterality: N/A;   TEE WITH CARDIOVERSION N/A 02/04/2020   Procedure: TEE WITH CARDIOVERSION; Location: [facility]; Surgeon: Kandis Cocking, MD   TRANSURETHRAL RESECTION OF BLADDER TUMOR N/A 05/15/2021   Procedure: TRANSURETHRAL RESECTION OF BLADDER TUMOR (TURBT);  Surgeon: Billey Co, MD;  Location: [facility] ORS;  Service: Urology;  Laterality: N/A;    HEMATOLOGY/ONCOLOGY HISTORY:  Oncology  History  Bladder cancer ([place])  05/29/2021 Initial Diagnosis   Bladder cancer ([place])   06/04/2021 Cancer Staging   Staging form: Urinary Bladder, AJCC 8th Edition - Clinical stage from 06/04/2021: Stage II (cT2, cN0, cM0) - Signed by Lloyd Huger, MD on 06/04/2021 WHO/ISUP grade (low/high): High Grade Histologic grading system: 2 grade system   06/23/2021 - 08/04/2021 Chemotherapy   Patient is on Treatment Plan : BLADDER Cisplatin q7d + XRT     06/08/2022 -  Chemotherapy   Patient is on Treatment Plan : BLADDER Pembrolizumab (200) q21d       ALLERGIES:  is allergic to atenolol and penicillins.  MEDICATIONS:  Current Outpatient Medications  Medication Sig Dispense Refill   amiodarone (PACERONE) 200 MG tablet Take by mouth.     atorvastatin (LIPITOR) 40 MG tablet Take 40 mg by mouth at bedtime.     diltiazem (CARDIZEM CD) 120 MG 24 hr capsule Take 1 capsule (120 mg total) by mouth daily. 30 capsule 0   ELIQUIS 5 MG TABS tablet Take 1 tablet (5 mg total) by mouth 2 (two) times daily. 60 tablet 1   magnesium oxide (MAG-OX) 400 MG tablet Take 1 tablet by mouth 2 (two) times daily.     metoprolol succinate (TOPROL-XL) 25 MG 24 hr tablet Take 1 tablet (25 mg total) by mouth 2 (two) times daily. Take with or immediately following a meal. 60 tablet 0   Multiple Vitamin (MULTIVITAMIN WITH MINERALS) TABS tablet Take 1 tablet by mouth daily. 90 tablet 0   traMADol (ULTRAM) 50 MG tablet Take One tab PO Q6 hours PRN pain (Patient not taking: Reported on 04/11/2022)     trolamine salicylate (ASPERCREME) 10 % cream Apply 1 application topically as needed for muscle pain.     vancomycin (VANCOCIN) 125 MG capsule Take 1 capsule (125 mg total) by mouth 4 (four) times daily. 40 capsule 0   No current facility-administered medications for this visit.    VITAL SIGNS: There were no vitals taken for this visit. There were no vitals filed for this visit.  Estimated body mass index is 25.45 kg/m  as calculated from the following:   Height as of 04/12/22: _0  (1.499 m).   Weight as of an earlier encounter on 06/01/22: 126 lb (57.2 kg).  LABS: CBC:    Component Value Date/Time   WBC 2.3 (L) 05/11/2022 1445   HGB 10.8 (L) 05/11/2022 1445   HCT 32.3 (L) 05/11/2022 1445   PLT 143 (L) 05/11/2022 1445   MCV 103.9 (H) 05/11/2022 1445   NEUTROABS 1.6 (L) 05/11/2022 1445   LYMPHSABS 0.6 (L) 05/11/2022 1445   MONOABS 0.1 05/11/2022 1445   EOSABS 0.0 05/11/2022 1445   BASOSABS 0.0 05/11/2022 1445   Comprehensive Metabolic Panel:    Component Value Date/Time   NA 137 05/11/2022 1445   K 3.1 (L) 05/11/2022 1445   CL 108 05/11/2022 1445   CO2 21 (L) 05/11/2022 1445   BUN 9 05/11/2022 1445   CREATININE 0.74 05/11/2022 1445   GLUCOSE 89 05/11/2022 1445   CALCIUM 8.1 (L)  05/11/2022 1445   AST 50 (H) 05/11/2022 1445   ALT 16 05/11/2022 1445   ALKPHOS 53 05/11/2022 1445   BILITOT 1.1 05/11/2022 1445   PROT 7.9 05/11/2022 1445   ALBUMIN 2.7 (L) 05/11/2022 1445    RADIOGRAPHIC STUDIES: NM PET Image Restag (PS) Skull Base To Thigh  Result Date: 05/28/2022 CLINICAL DATA:  Subsequent treatment strategy for bladder cancer. EXAM: NUCLEAR MEDICINE PET SKULL BASE TO THIGH TECHNIQUE: 7.1 mCi F-18 FDG was injected intravenously. Full-ring PET imaging was performed from the skull base to thigh after the radiotracer. CT data was obtained and used for attenuation correction and anatomic localization. Fasting blood glucose: 69 mg/dl COMPARISON:  MR abdomen 04/11/2022, CT abdomen pelvis 04/11/2022, PET 06/17/2021. FINDINGS: Mediastinal blood pool activity: SUV max 1.7 Liver activity: SUV max NA NECK: No abnormal hypermetabolism. Incidental CT findings: None. CHEST: Hypermetabolic low left internal jugular adenopathy with index lymph node measuring 1.6 cm (2/69), SUV 15.1. Hypermetabolic mediastinal lymph nodes with index subcarinal lymph node measuring 1.2 cm (2/85), SUV max 7.8. Incidental CT  findings: Atherosclerotic calcification of the aorta, aortic valve and coronary arteries. Heart is at the upper limits of normal in size to mildly enlarged. No pericardial or pleural effusion. No suspicious pulmonary nodules. ABDOMEN/PELVIS: Hypermetabolic liver lesions are seen bilaterally. Index mildly hypodense lesion in the inferior left hepatic lobe measures approximately 2.0 x 2.9 cm (2/134), SUV max 6.1. Hypermetabolic periportal, abdominal peritoneal ligament and retroperitoneal adenopathy. Index left periaortic lymph node measures 1.6 cm (2/128), SUV max 11.7. No abnormal hypermetabolism in the adrenal glands, spleen or pancreas. Incidental CT findings: Cirrhosis. Gallstones. Adrenal glands are unremarkable. Tiny stones in the kidneys. Spleen, pancreas, stomach and bowel are grossly unremarkable. Atherosclerotic calcification of the aorta. Enlarged and heterogeneous uterus. SKELETON: Scattered hypermetabolic lesions. Index lesion with a soft tissue component in the anterior right fifth rib, SUV max 10.1. Incidental CT findings: Degenerative changes in the spine. IMPRESSION: 1. Widespread hypermetabolic metastatic disease involving low internal jugular, mediastinal and abdominal lymph nodes, liver and bones. Findings may be due to metastatic bladder cancer but the possibility of a new primary malignancy, such as hepatocellular carcinoma, is also considered. 2. Enlarged heterogeneous uterus. Consider pelvic ultrasound in further evaluation. 3. Cirrhosis. 4. Cholelithiasis. 5. Bilateral renal stones. 6. Aortic atherosclerosis (ICD10-I70.0). Coronary artery calcification. Electronically Signed   By: Lorin Picket M.D.   On: 05/28/2022 08:34    PERFORMANCE STATUS (ECOG) : 3 - Symptomatic, >50% confined to bed  Review of Systems Unless otherwise noted, a complete review of systems is negative.  Physical Exam General: NAD Cardiovascular: regular rate and rhythm Pulmonary: clear ant fields Abdomen:  soft, nontender, + bowel sounds GU: no suprapubic tenderness Extremities: no edema, no joint deformities Skin: no rashes Neurological: Weakness but otherwise nonfocal  IMPRESSION: I met with patient following her visit with Dr. Grayland Ormond.   Patient reports that she received "bad news" today regarding her cancer.  Patient has decided to pursue immunotherapy but recognizes that there are likely no additional viable treatment options available in the event of further cancer progression.  Hospice was discussed by Dr. Grayland Ormond, but again, patient opted to proceed with treatment.  Emotional support provided. Will consult LCSW.   Patient denies significant symptomatic burden today.  She feels like she is still doing reasonably well at home.  Offered to involve community palliative care and patient stated that she would think about it.  PLAN: -Continue current scope of treatment -Will benefit from MOST form -Follow-up  telephone visit 1 month  Case and plan discussed with Dr. Grayland Ormond  Patient expressed understanding and was in agreement with this plan. She also understands that She can call the clinic at any time with any questions, concerns, or complaints.     Time Total: 15 minutes  Visit consisted of counseling and education dealing with the complex and emotionally intense issues of symptom management and palliative care in the setting of serious and potentially life-threatening illness.Greater than 50%  of this time was spent counseling and coordinating care related to the above assessment and plan.  Signed by: Altha Harm, PhD, NP-C

## 2022-06-01 NOTE — Progress Notes (Signed)
[place]  Telephone:(336) [phone] Fax:(336) [phone]  ID: Kristina Ellis OB: 10/18/1950  MR#: [id number]  JQZ#:[id number]  Patient Care Team: [facility], [place] as PCP - General (General Practice) Lloyd Huger, MD as Consulting Physician (Hematology and Oncology)  CHIEF COMPLAINT: Stage II bladder cancer.  INTERVAL HISTORY: Patient returns to clinic today for further evaluation and discussion of her imaging results.  She has persistent diarrhea and intermittent abdominal pain, but otherwise feels well.  She has chronic weakness and fatigue.  She has no neurologic complaints.  She denies any recent fevers or illnesses.  She has a good appetite and denies weight loss.  She has no chest pain, cough, or hemoptysis.  She denies any nausea, vomiting, or constipation.  She has no urinary complaints.  Patient offers no further specific complaints today.  REVIEW OF SYSTEMS:   Review of Systems  Constitutional:  Positive for malaise/fatigue. Negative for fever and weight loss.  Respiratory: Negative.  Negative for cough, hemoptysis and shortness of breath.   Cardiovascular: Negative.  Negative for chest pain and leg swelling.  Gastrointestinal:  Positive for abdominal pain and diarrhea.  Genitourinary:  Negative for hematuria.  Musculoskeletal:  Negative for back pain, falls and joint pain.  Skin: Negative.  Negative for rash.  Neurological:  Positive for weakness. Negative for dizziness, seizures and headaches.  Psychiatric/Behavioral: Negative.  The patient is not nervous/anxious.     As per HPI. Otherwise, a complete review of systems is negative.  PAST MEDICAL HISTORY: Past Medical History:  Diagnosis Date  . Aortic atherosclerosis ([place])   . Arthritis   . Atrial fibrillation ([place])    a.) s/p TEE with cardioversion on 02/04/2020. b.) on daily apixaban.  . Bladder mass    a.) CT 04/08/2021 --> 3.2 cm intraluminal bladder mass.  . Carotid  stenosis, bilateral    a.) Doppler 07/04/2020 --> mild; 1-49% stenosis BILATERALLY.  Marland Kitchen Cholelithiasis    a.) CT 11/06/2020 --> largest measured 4.5 cm  . Chronic anticoagulation    a.) Apixaban  . Common biliary duct calculus    a.) CT 04/08/2021 --> CBD dilated at 8 mm; 75m calculus within distal CBD.  .Marland KitchenCoronary artery disease involving native coronary artery of native heart without angina pectoris 12/21/2019  . Diastolic dysfunction    a.) TTE 07/04/2020 --> LVEF 60-65%; G1DD.  .Marland KitchenHepatic cirrhosis ([place]   . Hepatic steatosis   . Hepatitis 1970  . History of 2019 novel coronavirus disease (COVID-19) 12/20/2019  . History of marijuana use   . Hypertension   . Hypertensive retinopathy   . IDA (iron deficiency anemia)   . Mitral stenosis    a.) TEE 04/10/2020 --> mild. b.) TTE 07/04/2020 --> EF 60-65%; mild (mean gradient 5 mmHg).  . Moderate pulmonary arterial systolic hypertension ([place] 12/22/2019  . Nephrolithiasis   . NSTEMI (non-ST elevated myocardial infarction) ([place] 07/26/2016  . Open-angle glaucoma 09/11/2010  . PAH (pulmonary artery hypertension) ([place]    a.) TTE 12/21/2019 --> PASP 44 mmHg.  .Marland KitchenUterine fibroid    a.) CT 04/08/2021 --> multiple with largest measuring 7 cm.  . Valvular regurgitation    a.) TTE 09/01/2010 --> trivial to mild pan-valvular. b.) TTE 12/21/2019 --> mild MR and AR; moderate TR. c.) TTE 07/04/2020 --> mild TR; trivial MR and PR.  .Marland KitchenVestibular neuronitis     PAST SURGICAL HISTORY: Past Surgical History:  Procedure Laterality Date  . BREAST CYST ASPIRATION Left   .  COLONOSCOPY  2012  . ERCP N/A 12/18/2021   Procedure: ENDOSCOPIC RETROGRADE CHOLANGIOPANCREATOGRAPHY (ERCP);  Surgeon: Lucilla Lame, MD;  [facility];  Service: Endoscopy;  Laterality: N/A;  . TEE WITH CARDIOVERSION N/A 02/04/2020   Procedure: TEE WITH CARDIOVERSION; Location: [facility]; Surgeon: Kandis Cocking, MD  . TRANSURETHRAL RESECTION OF BLADDER TUMOR N/A 05/15/2021    Procedure: TRANSURETHRAL RESECTION OF BLADDER TUMOR (TURBT);  Surgeon: Billey Co, MD;  Location: [facility] ORS;  Service: Urology;  Laterality: N/A;    FAMILY HISTORY: Family History  Problem Relation Age of Onset  . Aneurysm Mother   . Colon cancer Father   . Lung cancer Brother   . Breast cancer Neg Hx     ADVANCED DIRECTIVES (Y/N):  N  HEALTH MAINTENANCE: Social History   Tobacco Use  . Smoking status: Former    Packs/day: 0.10    Years: 10.00    Total pack years: 1.00    Types: Cigarettes    Passive exposure: Past  . Smokeless tobacco: Never  . Tobacco comments:    occasional smoker  Vaping Use  . Vaping Use: Never used  Substance Use Topics  . Alcohol use: Yes    Alcohol/week: 5.0 standard drinks of alcohol    Types: 5 Cans of beer per week    Comment: weekly  . 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 Atenolol Other (See Comments)    bradycardia bradycardia   . Penicillins Rash    Current Outpatient Medications  Medication Sig Dispense Refill  . amiodarone (PACERONE) 200 MG tablet Take by mouth.    Marland Kitchen atorvastatin (LIPITOR) 40 MG tablet Take 40 mg by mouth at bedtime.    Marland Kitchen diltiazem (CARDIZEM CD) 120 MG 24 hr capsule Take 1 capsule (120 mg total) by mouth daily. 30 capsule 0  . ELIQUIS 5 MG TABS tablet Take 1 tablet (5 mg total) by mouth 2 (two) times daily. 60 tablet 1  . magnesium oxide (MAG-OX) 400 MG tablet Take 1 tablet by mouth 2 (two) times daily.    . Multiple Vitamin (MULTIVITAMIN WITH MINERALS) TABS tablet Take 1 tablet by mouth daily. 90 tablet 0  . trolamine salicylate (ASPERCREME) 10 % cream Apply 1 application topically as needed for muscle pain.    . vancomycin (VANCOCIN) 125 MG capsule Take 1 capsule (125 mg total) by mouth 4 (four) times daily. 40 capsule 0  . metoprolol succinate (TOPROL-XL) 25 MG 24 hr tablet  Take 1 tablet (25 mg total) by mouth 2 (two) times daily. Take with or immediately following a meal. 60 tablet 0  . traMADol (ULTRAM) 50 MG tablet Take One tab PO Q6 hours PRN pain (Patient not taking: Reported on 04/11/2022)     No current facility-administered medications for this visit.    OBJECTIVE: Vitals:   06/01/22 1303  BP: (!) 153/101  Pulse: 85  Temp: 98.9 F (37.2 C)     Body mass index is 25.45 kg/m.    ECOG FS:2 - Symptomatic, <50% confined to bed  General: Thin, no acute distress.  Sitting in a wheelchair. Eyes: Pink conjunctiva, anicteric sclera. HEENT: Normocephalic, moist mucous membranes. Lungs: No audible wheezing or coughing. Heart: Regular rate and rhythm. Abdomen: Soft, nontender, no obvious distention. Musculoskeletal: No edema, cyanosis, or clubbing. Neuro: Alert, answering all questions appropriately. Cranial  nerves grossly intact. Skin: No rashes or petechiae noted. Psych: Normal affect. Lymphatics: Easily palpable right inguinal lymph node.  LAB RESULTS:  Lab Results  Component Value Date   NA 137 05/11/2022   K 3.1 (L) 05/11/2022   CL 108 05/11/2022   CO2 21 (L) 05/11/2022   GLUCOSE 89 05/11/2022   BUN 9 05/11/2022   CREATININE 0.74 05/11/2022   CALCIUM 8.1 (L) 05/11/2022   PROT 7.9 05/11/2022   ALBUMIN 2.7 (L) 05/11/2022   AST 50 (H) 05/11/2022   ALT 16 05/11/2022   ALKPHOS 53 05/11/2022   BILITOT 1.1 05/11/2022   GFRNONAA >60 05/11/2022   GFRAA >60 03/07/2018    Lab Results  Component Value Date   WBC 2.3 (L) 05/11/2022   NEUTROABS 1.6 (L) 05/11/2022   HGB 10.8 (L) 05/11/2022   HCT 32.3 (L) 05/11/2022   MCV 103.9 (H) 05/11/2022   PLT 143 (L) 05/11/2022     STUDIES: NM PET Image Restag (PS) Skull Base To Thigh  Result Date: 05/28/2022 CLINICAL DATA:  Subsequent treatment strategy for bladder cancer. EXAM: NUCLEAR MEDICINE PET SKULL BASE TO THIGH TECHNIQUE: 7.1 mCi F-18 FDG was injected intravenously. Full-ring PET imaging was  performed from the skull base to thigh after the radiotracer. CT data was obtained and used for attenuation correction and anatomic localization. Fasting blood glucose: 69 mg/dl COMPARISON:  MR abdomen 04/11/2022, CT abdomen pelvis 04/11/2022, PET 06/17/2021. FINDINGS: Mediastinal blood pool activity: SUV max 1.7 Liver activity: SUV max NA NECK: No abnormal hypermetabolism. Incidental CT findings: None. CHEST: Hypermetabolic low left internal jugular adenopathy with index lymph node measuring 1.6 cm (2/69), SUV 15.1. Hypermetabolic mediastinal lymph nodes with index subcarinal lymph node measuring 1.2 cm (2/85), SUV max 7.8. Incidental CT findings: Atherosclerotic calcification of the aorta, aortic valve and coronary arteries. Heart is at the upper limits of normal in size to mildly enlarged. No pericardial or pleural effusion. No suspicious pulmonary nodules. ABDOMEN/PELVIS: Hypermetabolic liver lesions are seen bilaterally. Index mildly hypodense lesion in the inferior left hepatic lobe measures approximately 2.0 x 2.9 cm (2/134), SUV max 6.1. Hypermetabolic periportal, abdominal peritoneal ligament and retroperitoneal adenopathy. Index left periaortic lymph node measures 1.6 cm (2/128), SUV max 11.7. No abnormal hypermetabolism in the adrenal glands, spleen or pancreas. Incidental CT findings: Cirrhosis. Gallstones. Adrenal glands are unremarkable. Tiny stones in the kidneys. Spleen, pancreas, stomach and bowel are grossly unremarkable. Atherosclerotic calcification of the aorta. Enlarged and heterogeneous uterus. SKELETON: Scattered hypermetabolic lesions. Index lesion with a soft tissue component in the anterior right fifth rib, SUV max 10.1. Incidental CT findings: Degenerative changes in the spine. IMPRESSION: 1. Widespread hypermetabolic metastatic disease involving low internal jugular, mediastinal and abdominal lymph nodes, liver and bones. Findings may be due to metastatic bladder cancer but the  possibility of a new primary malignancy, such as hepatocellular carcinoma, is also considered. 2. Enlarged heterogeneous uterus. Consider pelvic ultrasound in further evaluation. 3. Cirrhosis. 4. Cholelithiasis. 5. Bilateral renal stones. 6. Aortic atherosclerosis (ICD10-I70.0). Coronary artery calcification. Electronically Signed   By: Lorin Picket M.D.   On: 05/28/2022 08:34    ASSESSMENT: Stage II bladder cancer.  PLAN:    Stage II bladder cancer: Pathology and imaging reviewed independently.  PET scan results from June 18, 2021 reviewed independently with no specific findings of active malignancy.  Since patient is not a surgical candidate she underwent TURBT.  Patient did not have port placement.   Patient received her fourth and last dose of cisplatin on  August 04, 2021.  She subsequently completed XRT on August 19, 2021.  Patient had CT scan on Dec 20, 2021 that revealed mild abdominal lymphadenopathy.  Cystoscopy on January 07, 2022 did not reveal any evidence of recurrence.  Repeat imaging as above revealed significant increase in lymphadenopathy as well as lesions in liver suggestive of some metastasis.  Both CEA and AFP are elevated, we will repeat these today.  Plan to get a PET scan in the next 1 to 2 weeks to further evaluate.  Can also consider biopsy of right inguinal node to confirm recurrence.  Patient will return to clinic 1 to 2 days after PET scan to discuss the results and additional diagnostic planning.  Patient also was evaluated by palliative care today.   Hypomagnesia: Chronic and unchanged.  Patient previously refused IV supplementation.  Continue oral magnesium supplementation.   Neutropenia: Resolve Anemia: Chronic unchanged.  Patient's hemoglobin is 9.9 today.   Thrombocytopenia: Resolved. A-fib: Patient states that she had a referral to cardiology, but did not keep this appointment.   Hypokalemia: Resolved.  Continue oral supplementation.   Patient expressed  understanding and was in agreement with this plan. She also understands that She can call clinic at any time with any questions, concerns, or complaints.    Cancer Staging  Bladder cancer [facility]) Staging form: Urinary Bladder, AJCC 8th Edition - Clinical stage from 06/04/2021: Stage II (cT2, cN0, cM0) - Signed by Lloyd Huger, MD on 06/04/2021 WHO/ISUP grade (low/high): High Grade Histologic grading system: 2 grade system  Lloyd Huger, MD   06/01/2022 1:18 PM

## 2022-06-02 ENCOUNTER — Encounter: Payer: Self-pay | Admitting: Oncology

## 2022-06-03 ENCOUNTER — Encounter: Payer: Self-pay | Admitting: Licensed Clinical Social Worker

## 2022-06-03 DIAGNOSIS — C670 Malignant neoplasm of trigone of bladder: Secondary | ICD-10-CM

## 2022-06-03 NOTE — Progress Notes (Signed)
[place] Work  Clinical Social Work was referred by medical provider for assessment of psychosocial needs.  Clinical Social Worker attempted to contact patient by phone  to offer support and assess for needs.  CSW unable to leave voicemail for patient. Will try again next week.    FA   Adelene Amas, LCSW  Clinical Social Worker [facility]

## 2022-06-08 ENCOUNTER — Inpatient Hospital Stay (HOSPITAL_BASED_OUTPATIENT_CLINIC_OR_DEPARTMENT_OTHER): Payer: Medicare Other | Admitting: Oncology

## 2022-06-08 ENCOUNTER — Encounter: Payer: Self-pay | Admitting: Licensed Clinical Social Worker

## 2022-06-08 ENCOUNTER — Inpatient Hospital Stay: Payer: Medicare Other

## 2022-06-08 ENCOUNTER — Ambulatory Visit: Payer: Medicare Other | Admitting: Oncology

## 2022-06-08 ENCOUNTER — Ambulatory Visit: Payer: Medicare Other

## 2022-06-08 ENCOUNTER — Other Ambulatory Visit: Payer: Medicare Other

## 2022-06-08 ENCOUNTER — Encounter: Payer: Self-pay | Admitting: Oncology

## 2022-06-08 VITALS — BP 175/98 | HR 91 | Temp 98.3°F | Resp 16 | Ht 59.0 in | Wt 126.0 lb

## 2022-06-08 DIAGNOSIS — C678 Malignant neoplasm of overlapping sites of bladder: Secondary | ICD-10-CM

## 2022-06-08 DIAGNOSIS — C679 Malignant neoplasm of bladder, unspecified: Secondary | ICD-10-CM | POA: Diagnosis not present

## 2022-06-08 DIAGNOSIS — C670 Malignant neoplasm of trigone of bladder: Secondary | ICD-10-CM

## 2022-06-08 LAB — CBC WITH DIFFERENTIAL/PLATELET
Abs Immature Granulocytes: 0 10*3/uL (ref 0.00–0.07)
Basophils Absolute: 0 10*3/uL (ref 0.0–0.1)
Basophils Relative: 1 %
Eosinophils Absolute: 0.1 10*3/uL (ref 0.0–0.5)
Eosinophils Relative: 6 %
HCT: 32.9 % — ABNORMAL LOW (ref 36.0–46.0)
Hemoglobin: 10.8 g/dL — ABNORMAL LOW (ref 12.0–15.0)
Immature Granulocytes: 0 %
Lymphocytes Relative: 26 %
Lymphs Abs: 0.4 10*3/uL — ABNORMAL LOW (ref 0.7–4.0)
MCH: 34 pg (ref 26.0–34.0)
MCHC: 32.8 g/dL (ref 30.0–36.0)
MCV: 103.5 fL — ABNORMAL HIGH (ref 80.0–100.0)
Monocytes Absolute: 0.2 10*3/uL (ref 0.1–1.0)
Monocytes Relative: 9 %
Neutro Abs: 0.9 10*3/uL — ABNORMAL LOW (ref 1.7–7.7)
Neutrophils Relative %: 58 %
Platelets: 141 10*3/uL — ABNORMAL LOW (ref 150–400)
RBC: 3.18 MIL/uL — ABNORMAL LOW (ref 3.87–5.11)
RDW: 14.6 % (ref 11.5–15.5)
WBC: 1.6 10*3/uL — ABNORMAL LOW (ref 4.0–10.5)
nRBC: 0 % (ref 0.0–0.2)

## 2022-06-08 LAB — COMPREHENSIVE METABOLIC PANEL
ALT: 20 U/L (ref 0–44)
AST: 59 U/L — ABNORMAL HIGH (ref 15–41)
Albumin: 2.8 g/dL — ABNORMAL LOW (ref 3.5–5.0)
Alkaline Phosphatase: 63 U/L (ref 38–126)
Anion gap: 6 (ref 5–15)
BUN: 9 mg/dL (ref 8–23)
CO2: 24 mmol/L (ref 22–32)
Calcium: 8.3 mg/dL — ABNORMAL LOW (ref 8.9–10.3)
Chloride: 112 mmol/L — ABNORMAL HIGH (ref 98–111)
Creatinine, Ser: 0.62 mg/dL (ref 0.44–1.00)
GFR, Estimated: >60 mL/min (ref >60)
Glucose, Bld: 81 mg/dL (ref 70–99)
Potassium: 2.8 mmol/L — ABNORMAL LOW (ref 3.5–5.1)
Sodium: 142 mmol/L (ref 135–145)
Total Bilirubin: 0.7 mg/dL (ref 0.3–1.2)
Total Protein: 7.9 g/dL (ref 6.5–8.1)

## 2022-06-08 LAB — TSH: TSH: 4.209 u[IU]/mL (ref 0.350–4.500)

## 2022-06-08 MED ORDER — SODIUM CHLORIDE 0.9 % IV SOLN
200.0000 mg | Freq: Once | INTRAVENOUS | Status: AC
  Administered 2022-06-08: 200 mg via INTRAVENOUS
  Filled 2022-06-08: qty 8

## 2022-06-08 MED ORDER — POTASSIUM CHLORIDE 20 MEQ/100ML IV SOLN
20.0000 meq | Freq: Once | INTRAVENOUS | Status: AC
  Administered 2022-06-08: 20 meq via INTRAVENOUS

## 2022-06-08 MED ORDER — SODIUM CHLORIDE 0.9 % IV SOLN
Freq: Once | INTRAVENOUS | Status: AC
  Filled 2022-06-08: qty 250

## 2022-06-08 MED ORDER — POTASSIUM CHLORIDE CRYS ER 20 MEQ PO TBCR: 20.0000 meq | EXTENDED_RELEASE_TABLET | Freq: Two times a day (BID) | ORAL | 1 refills | Status: DC

## 2022-06-08 NOTE — Patient Instructions (Addendum)
[facility]  Discharge Instructions: Thank you for choosing [facility] to provide your oncology and hematology care.  If you have a lab appointment with the Cancer Center, please go directly to the Cancer Center and check in at the registration area.  Wear comfortable clothing and clothing appropriate for easy access to any Portacath or PICC line.   We strive to give you quality time with your provider. You may need to reschedule your appointment if you arrive late (15 or more minutes).  Arriving late affects you and other patients whose appointments are after yours.  Also, if you miss three or more appointments without notifying the office, you may be dismissed from the clinic at the provider's discretion.      For prescription refill requests, have your pharmacy contact our office and allow 72 hours for refills to be completed.    Today you received the following chemotherapy and/or immunotherapy agents Keytruda      To help prevent nausea and vomiting after your treatment, we encourage you to take your nausea medication as directed.  BELOW ARE SYMPTOMS THAT SHOULD BE REPORTED IMMEDIATELY: *FEVER GREATER THAN 100.4 F (38 C) OR HIGHER *CHILLS OR SWEATING *NAUSEA AND VOMITING THAT IS NOT CONTROLLED WITH YOUR NAUSEA MEDICATION *UNUSUAL SHORTNESS OF BREATH *UNUSUAL BRUISING OR BLEEDING *URINARY PROBLEMS (pain or burning when urinating, or frequent urination) *BOWEL PROBLEMS (unusual diarrhea, constipation, pain near the anus) TENDERNESS IN MOUTH AND THROAT WITH OR WITHOUT PRESENCE OF ULCERS (sore throat, sores in mouth, or a toothache) UNUSUAL RASH, SWELLING OR PAIN  UNUSUAL VAGINAL DISCHARGE OR ITCHING   Items with * indicate a potential emergency and should be followed up as soon as possible or go to the Emergency Department if any problems should occur.  Please show the CHEMOTHERAPY ALERT CARD or IMMUNOTHERAPY ALERT CARD at check-in to  the Emergency Department and triage nurse.  Should you have questions after your visit or need to cancel or reschedule your appointment, please contact [facility]  [phone] and follow the prompts.  Office hours are 8:00 a.m. to 4:30 p.m. Monday - Friday. Please note that voicemails left after 4:00 p.m. may not be returned until the following business day.  We are closed weekends and major holidays. You have access to a nurse at all times for urgent questions. Please call the main number to the clinic [phone] and follow the prompts.  For any non-urgent questions, you may also contact your provider using MyChart. We now offer e-Visits for anyone 18 and older to request care online for non-urgent symptoms. For details visit [email or web address].   Also download the MyChart app! Go to the app store, search "MyChart", open the app, select [facility], and log in with your MyChart username and password.  Masks are optional in the cancer centers. If you would like for your care team to wear a mask while they are taking care of you, please let them know. For doctor visits, patients may have with them one support person who is at least 71 years old. At this time, visitors are not allowed in the infusion area.  Pembrolizumab Injection What is this medication? PEMBROLIZUMAB (PEM broe LIZ ue mab) treats some types of cancer. It works by helping your immune system slow or stop the spread of cancer cells. It is a monoclonal antibody. This medicine may be used for other purposes; ask your health care provider or pharmacist if you have questions.   COMMON BRAND NAME(S): Keytruda What should I tell my care team before I take this medication? They need to know if you have any of these conditions: Allogeneic stem cell transplant (uses someone else's stem cells) Autoimmune diseases, such as Crohn disease, ulcerative colitis, lupus History of chest radiation Nervous system  problems, such as Guillain-Barre syndrome, myasthenia gravis Organ transplant An unusual or allergic reaction to pembrolizumab, other medications, foods, dyes, or preservatives Pregnant or trying to get pregnant Breast-feeding How should I use this medication? This medication is injected into a vein. It is given by your care team in a hospital or clinic setting. A special MedGuide will be given to you before each treatment. Be sure to read this information carefully each time. Talk to your care team about the use of this medication in children. While it may be prescribed for children as young as 6 months for selected conditions, precautions do apply. Overdosage: If you think you have taken too much of this medicine contact a poison control center or emergency room at once. NOTE: This medicine is only for you. Do not share this medicine with others. What if I miss a dose? Keep appointments for follow-up doses. It is important not to miss your dose. Call your care team if you are unable to keep an appointment. What may interact with this medication? Interactions have not been studied. This list may not describe all possible interactions. Give your health care provider a list of all the medicines, herbs, non-prescription drugs, or dietary supplements you use. Also tell them if you smoke, drink alcohol, or use illegal drugs. Some items may interact with your medicine. What should I watch for while using this medication? Your condition will be monitored carefully while you are receiving this medication. You may need blood work while taking this medication. This medication may cause serious skin reactions. They can happen weeks to months after starting the medication. Contact your care team right away if you notice fevers or flu-like symptoms with a rash. The rash may be red or purple and then turn into blisters or peeling of the skin. You may also notice a red rash with swelling of the face, lips, or  lymph nodes in your neck or under your arms. Tell your care team right away if you have any change in your eyesight. Talk to your care team if you may be pregnant. Serious birth defects can occur if you take this medication during pregnancy and for 4 months after the last dose. You will need a negative pregnancy test before starting this medication. Contraception is recommended while taking this medication and for 4 months after the last dose. Your care team can help you find the option that works for you. Do not breastfeed while taking this medication and for 4 months after the last dose. What side effects may I notice from receiving this medication? Side effects that you should report to your care team as soon as possible: Allergic reactions--skin rash, itching, hives, swelling of the face, lips, tongue, or throat Dry cough, shortness of breath or trouble breathing Eye pain, redness, irritation, or discharge with blurry or decreased vision Heart muscle inflammation--unusual weakness or fatigue, shortness of breath, chest pain, fast or irregular heartbeat, dizziness, swelling of the ankles, feet, or hands Hormone gland problems--headache, sensitivity to light, unusual weakness or fatigue, dizziness, fast or irregular heartbeat, increased sensitivity to cold or heat, excessive sweating, constipation, hair loss, increased thirst or amount of urine, tremors or shaking, irritability Infusion   reactions--chest pain, shortness of breath or trouble breathing, feeling faint or lightheaded Kidney injury (glomerulonephritis)--decrease in the amount of urine, red or dark brown urine, foamy or bubbly urine, swelling of the ankles, hands, or feet Liver injury--right upper belly pain, loss of appetite, nausea, light-colored stool, dark yellow or brown urine, yellowing skin or eyes, unusual weakness or fatigue Pain, tingling, or numbness in the hands or feet, muscle weakness, change in vision, confusion or trouble  speaking, loss of balance or coordination, trouble walking, seizures Rash, fever, and swollen lymph nodes Redness, blistering, peeling, or loosening of the skin, including inside the mouth Sudden or severe stomach pain, bloody diarrhea, fever, nausea, vomiting Side effects that usually do not require medical attention (report to your care team if they continue or are bothersome): Bone, joint, or muscle pain Diarrhea Fatigue Loss of appetite Nausea Skin rash This list may not describe all possible side effects. Call your doctor for medical advice about side effects. You may report side effects to FDA at 1-800-FDA-1088. Where should I keep my medication? This medication is given in a hospital or clinic. It will not be stored at home. NOTE: This sheet is a summary. It may not cover all possible information. If you have questions about this medicine, talk to your doctor, pharmacist, or health care provider.  2023 Elsevier/Gold Standard (2013-04-16 00:00:00)  

## 2022-06-08 NOTE — Progress Notes (Signed)
[facility] [facility]  Clinical Social Work was referred by medical provider for assessment of psychosocial needs.  Clinical Social Worker attempted to contact patient by phone  to offer support and assess for needs.  CSW unable to leave voicemail.    SA   Adelene Amas, LCSW  Clinical Social Worker [facility]

## 2022-06-08 NOTE — Progress Notes (Signed)
[place]  [phone]) [phone] Fax:(336) [phone]  ID: Kristina Ellis OB: [birth year]  MR#: [id number]  IHK#:[id number]  Patient Care Team: [facility] [place] as PCP - General (General Practice) Lloyd Huger, MD as Consulting Physician (Hematology and Oncology)  CHIEF COMPLAINT: Progressive stage IV bladder cancer.  INTERVAL HISTORY: Patient returns to clinic today for further evaluation, discussion of her PET scan results, and treatment planning.  Diarrhea has improved.  She continues to have lower abdominal discomfort.  She has chronic weakness and fatigue.  She has no neurologic complaints.  She denies any recent fevers or illnesses.  She has a good appetite and denies weight loss.  She has no chest pain, cough, or hemoptysis.  She denies any nausea, vomiting, or constipation.  She has no urinary complaints.  Patient offers no further specific complaints today.  REVIEW OF SYSTEMS:   Review of Systems  Constitutional:  Positive for malaise/fatigue. Negative for fever and weight loss.  Respiratory: Negative.  Negative for cough, hemoptysis and shortness of breath.   Cardiovascular: Negative.  Negative for chest pain and leg swelling.  Gastrointestinal:  Positive for abdominal pain and diarrhea.  Genitourinary:  Negative for hematuria.  Musculoskeletal:  Negative for back pain, falls and joint pain.  Skin: Negative.  Negative for rash.  Neurological:  Positive for weakness. Negative for dizziness, seizures and headaches.  Psychiatric/Behavioral: Negative.  The patient is not nervous/anxious.     As per HPI. Otherwise, a complete review of systems is negative.  PAST MEDICAL HISTORY: Past Medical History:  Diagnosis Date  . Aortic atherosclerosis ([place])   . Arthritis   . Atrial fibrillation ([place])    a.) s/p TEE with cardioversion on 02/04/2020. b.) on daily apixaban.  . Bladder mass    a.) CT 04/08/2021 --> 3.2 cm intraluminal bladder  mass.  . Carotid stenosis, bilateral    a.) Doppler 07/04/2020 --> mild; 1-49% stenosis BILATERALLY.  Marland Kitchen Cholelithiasis    a.) CT 11/06/2020 --> largest measured 4.5 cm  . Chronic anticoagulation    a.) Apixaban  . Common biliary duct calculus    a.) CT 04/08/2021 --> CBD dilated at 8 mm; 19m calculus within distal CBD.  .Marland KitchenCoronary artery disease involving native coronary artery of native heart without angina pectoris 12/21/2019  . Diastolic dysfunction    a.) TTE 07/04/2020 --> LVEF 60-65%; G1DD.  .Marland KitchenHepatic cirrhosis ([place]   . Hepatic steatosis   . Hepatitis 1970  . History of 2019 novel coronavirus disease (COVID-19) 12/20/2019  . History of marijuana use   . Hypertension   . Hypertensive retinopathy   . IDA (iron deficiency anemia)   . Mitral stenosis    a.) TEE 04/10/2020 --> mild. b.) TTE 07/04/2020 --> EF 60-65%; mild (mean gradient 5 mmHg).  . Moderate pulmonary arterial systolic hypertension ([place] 12/22/2019  . Nephrolithiasis   . NSTEMI (non-ST elevated myocardial infarction) ([place] 07/26/2016  . Open-angle glaucoma 09/11/2010  . PAH (pulmonary artery hypertension) ([place]    a.) TTE 12/21/2019 --> PASP 44 mmHg.  .Marland KitchenUterine fibroid    a.) CT 04/08/2021 --> multiple with largest measuring 7 cm.  . Valvular regurgitation    a.) TTE 09/01/2010 --> trivial to mild pan-valvular. b.) TTE 12/21/2019 --> mild MR and AR; moderate TR. c.) TTE 07/04/2020 --> mild TR; trivial MR and PR.  .Marland KitchenVestibular neuronitis     PAST SURGICAL HISTORY: Past Surgical History:  Procedure Laterality Date  . BREAST CYST ASPIRATION  Left   . COLONOSCOPY  2012  . ERCP N/A 12/18/2021   Procedure: ENDOSCOPIC RETROGRADE CHOLANGIOPANCREATOGRAPHY (ERCP);  Surgeon: Lucilla Lame, MD;  Location: [facility] ENDOSCOPY;  Service: Endoscopy;  Laterality: N/A;  . TEE WITH CARDIOVERSION N/A 02/04/2020   Procedure: TEE WITH CARDIOVERSION; Location: [facility]; Surgeon: Kandis Cocking, MD  . TRANSURETHRAL RESECTION OF BLADDER TUMOR N/A  05/15/2021   Procedure: TRANSURETHRAL RESECTION OF BLADDER TUMOR (TURBT);  Surgeon: Billey Co, MD;  Location: [facility] ORS;  Service: Urology;  Laterality: N/A;    FAMILY HISTORY: Family History  Problem Relation Age of Onset  . Aneurysm Mother   . Colon cancer Father   . Lung cancer Brother   . Breast cancer Neg Hx     ADVANCED DIRECTIVES (Y/N):  N  HEALTH MAINTENANCE: Social History   Tobacco Use  . Smoking status: Former    Packs/day: 0.10    Years: 10.00    Total pack years: 1.00    Types: Cigarettes    Passive exposure: Past  . Smokeless tobacco: Never  . Tobacco comments:    occasional smoker  Vaping Use  . Vaping Use: Never used  Substance Use Topics  . Alcohol use: Yes    Alcohol/week: 5.0 standard drinks of alcohol    Types: 5 Cans of beer per week    Comment: weekly  . 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 Atenolol Other (See Comments)    bradycardia bradycardia   . Penicillins Rash    Current Outpatient Medications  Medication Sig Dispense Refill  . amiodarone (PACERONE) 200 MG tablet Take by mouth.    Marland Kitchen atorvastatin (LIPITOR) 40 MG tablet Take 40 mg by mouth at bedtime.    Marland Kitchen diltiazem (CARDIZEM CD) 120 MG 24 hr capsule Take 1 capsule (120 mg total) by mouth daily. 30 capsule 0  . ELIQUIS 5 MG TABS tablet Take 1 tablet (5 mg total) by mouth 2 (two) times daily. 60 tablet 1  . magnesium oxide (MAG-OX) 400 MG tablet Take 1 tablet by mouth 2 (two) times daily.    . metoprolol succinate (TOPROL-XL) 25 MG 24 hr tablet Take 1 tablet (25 mg total) by mouth 2 (two) times daily. Take with or immediately following a meal. 60 tablet 0  . Multiple Vitamin (MULTIVITAMIN WITH MINERALS) TABS tablet Take 1 tablet by mouth daily. 90 tablet 0  . trolamine salicylate (ASPERCREME) 10 % cream Apply 1 application topically as needed for  muscle pain.    . vancomycin (VANCOCIN) 125 MG capsule Take 1 capsule (125 mg total) by mouth 4 (four) times daily. 40 capsule 0  . traMADol (ULTRAM) 50 MG tablet Take One tab PO Q6 hours PRN pain (Patient not taking: Reported on 04/11/2022)     No current facility-administered medications for this visit.    OBJECTIVE: Vitals:   06/08/22 0914  BP: (!) 175/98  Pulse: 91  Resp: 16  Temp: 98.3 F (36.8 C)  SpO2: 100%     Body mass index is 25.45 kg/m.    ECOG FS:1 - Symptomatic but completely ambulatory  General: Thin, no acute distress.  Sitting in a wheelchair. Eyes: Pink conjunctiva, anicteric sclera. HEENT: Normocephalic, moist mucous membranes. Lungs: No audible wheezing or coughing. Heart: Regular rate and rhythm. Abdomen: Soft, nontender, no obvious distention. Musculoskeletal: No edema, cyanosis,  or clubbing. Neuro: Alert, answering all questions appropriately. Cranial nerves grossly intact. Skin: No rashes or petechiae noted. Psych: Normal affect. Status: Easily palpable inguinal lymph node.   LAB RESULTS:  Lab Results  Component Value Date   NA 142 06/08/2022   K 2.8 (L) 06/08/2022   CL 112 (H) 06/08/2022   CO2 24 06/08/2022   GLUCOSE 81 06/08/2022   BUN 9 06/08/2022   CREATININE 0.62 06/08/2022   CALCIUM 8.3 (L) 06/08/2022   PROT 7.9 06/08/2022   ALBUMIN 2.8 (L) 06/08/2022   AST 59 (H) 06/08/2022   ALT 20 06/08/2022   ALKPHOS 63 06/08/2022   BILITOT 0.7 06/08/2022   GFRNONAA >60 06/08/2022   GFRAA >60 03/07/2018    Lab Results  Component Value Date   WBC 1.6 (L) 06/08/2022   NEUTROABS 0.9 (L) 06/08/2022   HGB 10.8 (L) 06/08/2022   HCT 32.9 (L) 06/08/2022   MCV 103.5 (H) 06/08/2022   PLT 141 (L) 06/08/2022     STUDIES: NM PET Image Restag (PS) Skull Base To Thigh  Result Date: 05/28/2022 CLINICAL DATA:  Subsequent treatment strategy for bladder cancer. EXAM: NUCLEAR MEDICINE PET SKULL BASE TO THIGH TECHNIQUE: 7.1 mCi F-18 FDG was injected  intravenously. Full-ring PET imaging was performed from the skull base to thigh after the radiotracer. CT data was obtained and used for attenuation correction and anatomic localization. Fasting blood glucose: 69 mg/dl COMPARISON:  MR abdomen 04/11/2022, CT abdomen pelvis 04/11/2022, PET 06/17/2021. FINDINGS: Mediastinal blood pool activity: SUV max 1.7 Liver activity: SUV max NA NECK: No abnormal hypermetabolism. Incidental CT findings: None. CHEST: Hypermetabolic low left internal jugular adenopathy with index lymph node measuring 1.6 cm (2/69), SUV 15.1. Hypermetabolic mediastinal lymph nodes with index subcarinal lymph node measuring 1.2 cm (2/85), SUV max 7.8. Incidental CT findings: Atherosclerotic calcification of the aorta, aortic valve and coronary arteries. Heart is at the upper limits of normal in size to mildly enlarged. No pericardial or pleural effusion. No suspicious pulmonary nodules. ABDOMEN/PELVIS: Hypermetabolic liver lesions are seen bilaterally. Index mildly hypodense lesion in the inferior left hepatic lobe measures approximately 2.0 x 2.9 cm (2/134), SUV max 6.1. Hypermetabolic periportal, abdominal peritoneal ligament and retroperitoneal adenopathy. Index left periaortic lymph node measures 1.6 cm (2/128), SUV max 11.7. No abnormal hypermetabolism in the adrenal glands, spleen or pancreas. Incidental CT findings: Cirrhosis. Gallstones. Adrenal glands are unremarkable. Tiny stones in the kidneys. Spleen, pancreas, stomach and bowel are grossly unremarkable. Atherosclerotic calcification of the aorta. Enlarged and heterogeneous uterus. SKELETON: Scattered hypermetabolic lesions. Index lesion with a soft tissue component in the anterior right fifth rib, SUV max 10.1. Incidental CT findings: Degenerative changes in the spine. IMPRESSION: 1. Widespread hypermetabolic metastatic disease involving low internal jugular, mediastinal and abdominal lymph nodes, liver and bones. Findings may be due to  metastatic bladder cancer but the possibility of a new primary malignancy, such as hepatocellular carcinoma, is also considered. 2. Enlarged heterogeneous uterus. Consider pelvic ultrasound in further evaluation. 3. Cirrhosis. 4. Cholelithiasis. 5. Bilateral renal stones. 6. Aortic atherosclerosis (ICD10-I70.0). Coronary artery calcification. Electronically Signed   By: Lorin Picket M.D.   On: 05/28/2022 08:34    ASSESSMENT: Progressive stage IV bladder cancer.  PLAN:    Progressive stage IV bladder cancer: PET scan results from May 28, 2022 reviewed independently and reported as above with widespread metastatic disease.  Previously, patient was determined not to be a surgical candidate and proceeded with concurrent chemotherapy and XRT.  She received her fourth and  last dose of cisplatin on August 04, 2021.  She subsequently completed XRT on August 19, 2021.  Patient had CT scan on Dec 20, 2021 that revealed mild abdominal lymphadenopathy.  Cystoscopy on January 07, 2022 did not reveal any evidence of recurrence.  Patient does not have a port.  After lengthy discussion which included hospice and comfort care, patient wishes to pursue systemic chemotherapy therefore will return to clinic in 1 week to initiate cycle 1 of single agent Keytruda.  Appreciate palliative care input. Hypomagnesia: Chronic and unchanged.  Patient previously refused IV supplementation.  Continue oral magnesium supplementation.   Neutropenia: Mild.  Patient's most recent [place] is 1.6. Anemia: Improved.  Patient's most recent hemoglobin is 10.8. Thrombocytopenia: Mild, monitor. A-fib: Patient states that she had a referral to cardiology, but did not keep this appointment.   Hypokalemia: Potassium is dropped to 3.1.  Continue oral supplementation.   Patient expressed understanding and was in agreement with this plan. She also understands that She can call clinic at any time with any questions, concerns, or complaints.     Cancer Staging  Bladder cancer [facility]) Staging form: Urinary Bladder, AJCC 8th Edition - Clinical stage from 06/04/2021: Stage II (cT2, cN0, cM0) - Signed by Lloyd Huger, MD on 06/04/2021 WHO/ISUP grade (low/high): High Grade Histologic grading system: 2 grade system  Lloyd Huger, MD   06/08/2022 9:32 AM

## 2022-06-09 ENCOUNTER — Encounter: Payer: Self-pay | Admitting: Oncology

## 2022-06-09 LAB — T4: T4, Total: 12.3 ug/dL — ABNORMAL HIGH (ref 4.5–12.0)

## 2022-06-18 ENCOUNTER — Inpatient Hospital Stay: Payer: Medicare Other | Attending: Oncology | Admitting: Licensed Clinical Social Worker

## 2022-06-18 DIAGNOSIS — Z5112 Encounter for antineoplastic immunotherapy: Secondary | ICD-10-CM | POA: Insufficient documentation

## 2022-06-18 DIAGNOSIS — Z87891 Personal history of nicotine dependence: Secondary | ICD-10-CM | POA: Insufficient documentation

## 2022-06-18 DIAGNOSIS — Z79899 Other long term (current) drug therapy: Secondary | ICD-10-CM | POA: Insufficient documentation

## 2022-06-18 DIAGNOSIS — I10 Essential (primary) hypertension: Secondary | ICD-10-CM | POA: Insufficient documentation

## 2022-06-18 DIAGNOSIS — I4891 Unspecified atrial fibrillation: Secondary | ICD-10-CM | POA: Insufficient documentation

## 2022-06-18 DIAGNOSIS — C670 Malignant neoplasm of trigone of bladder: Secondary | ICD-10-CM

## 2022-06-18 DIAGNOSIS — C679 Malignant neoplasm of bladder, unspecified: Secondary | ICD-10-CM | POA: Insufficient documentation

## 2022-06-18 NOTE — Progress Notes (Signed)
[place] Work  Initial Assessment   Kristina Ellis is a 71 y.o. year old female contacted by phone. Clinical Social Work was referred by medical provider for assessment of psychosocial needs.   SDOH (Social Determinants of Health) assessments performed: Yes SDOH Interventions    Flowsheet Row Clinical Support from 06/18/2022 in [facility] Interventions   Food Insecurity Interventions Intervention Not Indicated  Housing Interventions Intervention Not Indicated  Transportation Interventions Intervention Not Indicated, Patient Resources (Friends/Family)  Utilities Interventions Intervention Not Indicated  Alcohol Usage Interventions Intervention Not Indicated (Score <7)  Financial Strain Interventions Intervention Not Indicated  Physical Activity Interventions Intervention Not Indicated  Stress Interventions Intervention Not Indicated  Social Connections Interventions Intervention Not Indicated       SDOH Screenings   Food Insecurity: No Food Insecurity (06/18/2022)  Housing: Low Risk  (06/18/2022)  Transportation Needs: No Transportation Needs (06/18/2022)  Utilities: Not At Risk (06/18/2022)  Alcohol Screen: Low Risk  (06/18/2022)  Depression (PHQ2-9): Low Risk  (06/18/2022)  Financial Resource Strain: Low Risk  (06/18/2022)  Physical Activity: Inactive (06/18/2022)  Social Connections: Socially Isolated (06/18/2022)  Stress: No Stress Concern Present (06/18/2022)  Tobacco Use: Medium Risk (06/08/2022)     Distress Screen completed: No     No data to display            Family/Social Information:  Housing Arrangement: patient lives with son,  main contact [place], [name] [phone] Family members/support persons in your life? Family, Friends, and Geophysical data processor concerns: no  Employment: Retired  .  Income source: Paediatric nurse concerns: No Type of concern: None Food  access concerns: no Religious or spiritual practice: Yes-but has not been able to attend services Services Currently in place:  [facility] Medicare  Coping/ Adjustment to diagnosis: Patient understands treatment plan and what happens next? yes Concerns about diagnosis and/or treatment: I'm not especially worried about anything Patient reported stressors:  no specific issues identified Hopes and/or priorities: N/A Patient enjoys time with family/ friends and church services Current coping skills/ strengths: Average or above average intelligence , Communication skills , Scientist, research (life sciences) , Motivation for treatment/growth , Religious Affiliation , Special hobby/interest , and Supportive family/friends     SUMMARY: Current SDOH Barriers:  Financial constraints related to fixed income, Limited social support, ADL IADL limitations, and Social Isolation  Clinical Social Work Clinical Goal(s):  Patient will follow up with DSS for medicaid application* as directed by SW  Interventions: Discussed common feeling and emotions when being diagnosed with cancer, and the importance of support during treatment Informed patient of the support team roles and support services at [facility] Provided CSW contact information and encouraged patient to call with any questions or concerns Referred patient to Mort Sawyers for financial assistance and Provided patient with information about CSW role in patient care, events and groups and other available resources. CSW will mail patient calendar information and DSS contact information.  Patient asked CSW about caregiver assistance.  CSW will mail patient caregiver information.  Follow Up Plan: Patient will contact CSW with any support or resource needs Patient verbalizes understanding of plan: Yes    Joseangel Nettleton, LCSW

## 2022-06-28 ENCOUNTER — Other Ambulatory Visit: Payer: Self-pay

## 2022-06-28 DIAGNOSIS — C670 Malignant neoplasm of trigone of bladder: Secondary | ICD-10-CM

## 2022-06-29 ENCOUNTER — Encounter: Payer: Self-pay | Admitting: Oncology

## 2022-06-29 ENCOUNTER — Inpatient Hospital Stay: Payer: Medicare Other

## 2022-06-29 ENCOUNTER — Inpatient Hospital Stay (HOSPITAL_BASED_OUTPATIENT_CLINIC_OR_DEPARTMENT_OTHER): Payer: Medicare Other | Admitting: Oncology

## 2022-06-29 DIAGNOSIS — Z5112 Encounter for antineoplastic immunotherapy: Secondary | ICD-10-CM | POA: Diagnosis not present

## 2022-06-29 DIAGNOSIS — I4891 Unspecified atrial fibrillation: Secondary | ICD-10-CM | POA: Diagnosis not present

## 2022-06-29 DIAGNOSIS — Z79899 Other long term (current) drug therapy: Secondary | ICD-10-CM | POA: Diagnosis not present

## 2022-06-29 DIAGNOSIS — C670 Malignant neoplasm of trigone of bladder: Secondary | ICD-10-CM

## 2022-06-29 DIAGNOSIS — C678 Malignant neoplasm of overlapping sites of bladder: Secondary | ICD-10-CM

## 2022-06-29 DIAGNOSIS — I10 Essential (primary) hypertension: Secondary | ICD-10-CM | POA: Diagnosis not present

## 2022-06-29 DIAGNOSIS — C679 Malignant neoplasm of bladder, unspecified: Secondary | ICD-10-CM | POA: Diagnosis present

## 2022-06-29 DIAGNOSIS — Z87891 Personal history of nicotine dependence: Secondary | ICD-10-CM | POA: Diagnosis not present

## 2022-06-29 LAB — COMPREHENSIVE METABOLIC PANEL
ALT: 31 U/L (ref 0–44)
AST: 99 U/L — ABNORMAL HIGH (ref 15–41)
Albumin: 3 g/dL — ABNORMAL LOW (ref 3.5–5.0)
Alkaline Phosphatase: 81 U/L (ref 38–126)
Anion gap: 8 (ref 5–15)
BUN: 15 mg/dL (ref 8–23)
CO2: 22 mmol/L (ref 22–32)
Calcium: 8.7 mg/dL — ABNORMAL LOW (ref 8.9–10.3)
Chloride: 105 mmol/L (ref 98–111)
Creatinine, Ser: 0.86 mg/dL (ref 0.44–1.00)
GFR, Estimated: >60 mL/min (ref >60)
Glucose, Bld: 103 mg/dL — ABNORMAL HIGH (ref 70–99)
Potassium: 3.5 mmol/L (ref 3.5–5.1)
Sodium: 135 mmol/L (ref 135–145)
Total Bilirubin: 1.2 mg/dL (ref 0.3–1.2)
Total Protein: 9.2 g/dL — ABNORMAL HIGH (ref 6.5–8.1)

## 2022-06-29 LAB — CBC WITH DIFFERENTIAL/PLATELET
Abs Immature Granulocytes: 0.05 10*3/uL (ref 0.00–0.07)
Basophils Absolute: 0 10*3/uL (ref 0.0–0.1)
Basophils Relative: 1 %
Eosinophils Absolute: 0 10*3/uL (ref 0.0–0.5)
Eosinophils Relative: 1 %
HCT: 39.8 % (ref 36.0–46.0)
Hemoglobin: 13.2 g/dL (ref 12.0–15.0)
Immature Granulocytes: 1 %
Lymphocytes Relative: 43 %
Lymphs Abs: 1.5 10*3/uL (ref 0.7–4.0)
MCH: 33.6 pg (ref 26.0–34.0)
MCHC: 33.2 g/dL (ref 30.0–36.0)
MCV: 101.3 fL — ABNORMAL HIGH (ref 80.0–100.0)
Monocytes Absolute: 0.2 10*3/uL (ref 0.1–1.0)
Monocytes Relative: 5 %
Neutro Abs: 1.7 10*3/uL (ref 1.7–7.7)
Neutrophils Relative %: 49 %
Platelets: 207 10*3/uL (ref 150–400)
RBC: 3.93 MIL/uL (ref 3.87–5.11)
RDW: 15.3 % (ref 11.5–15.5)
Smear Review: NORMAL
WBC: 3.5 10*3/uL — ABNORMAL LOW (ref 4.0–10.5)
nRBC: 0 % (ref 0.0–0.2)

## 2022-06-29 LAB — TSH: TSH: 4.939 u[IU]/mL — ABNORMAL HIGH (ref 0.350–4.500)

## 2022-06-29 MED ORDER — SODIUM CHLORIDE 0.9 % IV SOLN
200.0000 mg | Freq: Once | INTRAVENOUS | Status: AC
  Administered 2022-06-29: 200 mg via INTRAVENOUS
  Filled 2022-06-29: qty 200

## 2022-06-29 MED ORDER — SODIUM CHLORIDE 0.9 % IV SOLN
Freq: Once | INTRAVENOUS | Status: AC
  Filled 2022-06-29: qty 250

## 2022-06-29 NOTE — Patient Instructions (Signed)
[facility]  Discharge Instructions: Thank you for choosing [facility] to provide your oncology and hematology care.  If you have a lab appointment with the Cancer Center, please go directly to the Cancer Center and check in at the registration area.  Wear comfortable clothing and clothing appropriate for easy access to any Portacath or PICC line.   We strive to give you quality time with your provider. You may need to reschedule your appointment if you arrive late (15 or more minutes).  Arriving late affects you and other patients whose appointments are after yours.  Also, if you miss three or more appointments without notifying the office, you may be dismissed from the clinic at the provider's discretion.      For prescription refill requests, have your pharmacy contact our office and allow 72 hours for refills to be completed.    Today you received the following chemotherapy and/or immunotherapy agents Keytruda      To help prevent nausea and vomiting after your treatment, we encourage you to take your nausea medication as directed.  BELOW ARE SYMPTOMS THAT SHOULD BE REPORTED IMMEDIATELY: *FEVER GREATER THAN 100.4 F (38 C) OR HIGHER *CHILLS OR SWEATING *NAUSEA AND VOMITING THAT IS NOT CONTROLLED WITH YOUR NAUSEA MEDICATION *UNUSUAL SHORTNESS OF BREATH *UNUSUAL BRUISING OR BLEEDING *URINARY PROBLEMS (pain or burning when urinating, or frequent urination) *BOWEL PROBLEMS (unusual diarrhea, constipation, pain near the anus) TENDERNESS IN MOUTH AND THROAT WITH OR WITHOUT PRESENCE OF ULCERS (sore throat, sores in mouth, or a toothache) UNUSUAL RASH, SWELLING OR PAIN  UNUSUAL VAGINAL DISCHARGE OR ITCHING   Items with * indicate a potential emergency and should be followed up as soon as possible or go to the Emergency Department if any problems should occur.  Please show the CHEMOTHERAPY ALERT CARD or IMMUNOTHERAPY ALERT CARD at check-in to  the Emergency Department and triage nurse.  Should you have questions after your visit or need to cancel or reschedule your appointment, please contact [facility]  [phone] and follow the prompts.  Office hours are 8:00 a.m. to 4:30 p.m. Monday - Friday. Please note that voicemails left after 4:00 p.m. may not be returned until the following business day.  We are closed weekends and major holidays. You have access to a nurse at all times for urgent questions. Please call the main number to the clinic [phone] and follow the prompts.  For any non-urgent questions, you may also contact your provider using MyChart. We now offer e-Visits for anyone 18 and older to request care online for non-urgent symptoms. For details visit [email or web address].   Also download the MyChart app! Go to the app store, search "MyChart", open the app, select [facility], and log in with your MyChart username and password.  Masks are optional in the cancer centers. If you would like for your care team to wear a mask while they are taking care of you, please let them know. For doctor visits, patients may have with them one support person who is at least 71 years old. At this time, visitors are not allowed in the infusion area.   

## 2022-06-29 NOTE — Progress Notes (Signed)
[facility]  [phone] [phone]) [phone]  ID: Kristina Ellis OB: 02/24/1951  MR#: [id number]  AOZ#:[id number]  Patient Care Team: [facility] [place] as PCP - General (General Practice) Lloyd Huger, MD as Consulting Physician (Hematology and Oncology)  CHIEF COMPLAINT: Progressive stage IV bladder cancer.  INTERVAL HISTORY: Patient returns to clinic today for further evaluation and consideration of cycle 2 of single agent Keytruda.  She tolerated her first treatment well without significant side effects.  She states she continues to have occasional diarrhea, but this is improved.  She does not complain of any abdominal pain or discomfort today.  She has chronic weakness and fatigue.  She has no neurologic complaints.  She denies any recent fevers or illnesses.  She has a fair appetite and denies weight loss.  She has no chest pain, cough, or hemoptysis.  She denies any nausea, vomiting, or constipation.  She has no urinary complaints.  Patient offers no further specific complaints today.  REVIEW OF SYSTEMS:   Review of Systems  Constitutional:  Positive for malaise/fatigue. Negative for fever and weight loss.  Respiratory: Negative.  Negative for cough, hemoptysis and shortness of breath.   Cardiovascular: Negative.  Negative for chest pain and leg swelling.  Gastrointestinal:  Positive for diarrhea. Negative for abdominal pain.  Genitourinary:  Negative for hematuria.  Musculoskeletal:  Negative for back pain, falls and joint pain.  Skin: Negative.  Negative for rash.  Neurological:  Positive for weakness. Negative for dizziness, seizures and headaches.  Psychiatric/Behavioral: Negative.  The patient is not nervous/anxious.     As per HPI. Otherwise, a complete review of systems is negative.  PAST MEDICAL HISTORY: Past Medical History:  Diagnosis Date   Aortic atherosclerosis ([place])    Arthritis    Atrial fibrillation ([place])     a.) s/p TEE with cardioversion on 02/04/2020. b.) on daily apixaban.   Bladder mass    a.) CT 04/08/2021 --> 3.2 cm intraluminal bladder mass.   Carotid stenosis, bilateral    a.) Doppler 07/04/2020 --> mild; 1-49% stenosis BILATERALLY.   Cholelithiasis    a.) CT 11/06/2020 --> largest measured 4.5 cm   Chronic anticoagulation    a.) Apixaban   Common biliary duct calculus    a.) CT 04/08/2021 --> CBD dilated at 8 mm; 33m calculus within distal CBD.   Coronary artery disease involving native coronary artery of native heart without angina pectoris 59/62/9528  Diastolic dysfunction    a.) TTE 07/04/2020 --> LVEF 60-65%; G1DD.   Hepatic cirrhosis (HCC)    Hepatic steatosis    Hepatitis 1970   History of 2019 novel coronavirus disease (COVID-19) 12/20/2019   History of marijuana use    Hypertension    Hypertensive retinopathy    IDA (iron deficiency anemia)    Mitral stenosis    a.) TEE 04/10/2020 --> mild. b.) TTE 07/04/2020 --> EF 60-65%; mild (mean gradient 5 mmHg).   Moderate pulmonary arterial systolic hypertension ([facility] 12/22/2019   Nephrolithiasis    NSTEMI (non-ST elevated myocardial infarction) ([facility] 07/26/2016   Open-angle glaucoma 09/11/2010   PAH (pulmonary artery hypertension) ([facility]    a.) TTE 12/21/2019 --> PASP 44 mmHg.   Uterine fibroid    a.) CT 04/08/2021 --> multiple with largest measuring 7 cm.   Valvular regurgitation    a.) TTE 09/01/2010 --> trivial to mild pan-valvular. b.) TTE 12/21/2019 --> mild MR and AR; moderate TR. c.) TTE 07/04/2020 --> mild TR; trivial MR  and PR.   Vestibular neuronitis     PAST SURGICAL HISTORY: Past Surgical History:  Procedure Laterality Date   BREAST CYST ASPIRATION Left    COLONOSCOPY  2012   ERCP N/A 12/18/2021   Procedure: ENDOSCOPIC RETROGRADE CHOLANGIOPANCREATOGRAPHY (ERCP);  Surgeon: Lucilla Lame, MD;  Location[facility]COPY;  Service: Endoscopy;  Laterality: N/A;   TEE WITH CARDIOVERSION N/A 02/04/2020   Procedure:  TEE WITH CARDIOVERSION; Location: [facility]; Surgeon: Kandis Cocking, MD   TRANSURETHRAL RESECTION OF BLADDER TUMOR N/A 05/15/2021   Procedure: TRANSURETHRAL RESECTION OF BLADDER TUMOR (TURBT);  Surgeon: Billey Co, MD;  Location: [facility] ORS;  Service: Urology;  Laterality: N/A;    FAMILY HISTORY: Family History  Problem Relation Age of Onset   Aneurysm Mother    Colon cancer Father    Lung cancer Brother    Breast cancer Neg Hx     ADVANCED DIRECTIVES (Y/N):  N  HEALTH MAINTENANCE: Social History   Tobacco Use   Smoking status: Former    Packs/day: 0.10    Years: 10.00    Total pack years: 1.00    Types: Cigarettes    Passive exposure: Past   Smokeless tobacco: Never   Tobacco comments:    occasional smoker  Vaping Use   Vaping Use: Never used  Substance Use Topics   Alcohol use: Yes    Alcohol/week: 5.0 standard drinks of alcohol    Types: 5 Cans of beer per week    Comment: weekly   Drug use: Not Currently    Types: 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     atorvastatin (LIPITOR) 40 MG tablet Take 40 mg by mouth at bedtime.     diltiazem (CARDIZEM CD) 120 MG 24 hr capsule Take 1 capsule (120 mg total) by mouth daily. 30 capsule 0   ELIQUIS 5 MG TABS tablet Take 1 tablet (5 mg total) by mouth 2 (two) times daily. 60 tablet 1   magnesium oxide (MAG-OX) 400 MG tablet Take 1 tablet by mouth 2 (two) times daily.     metoprolol succinate (TOPROL-XL) 25 MG 24 hr tablet Take 1 tablet (25 mg total) by mouth 2 (two) times daily. Take with or immediately following a meal. 60 tablet 0   potassium chloride SA (KLOR-CON M) 20 MEQ tablet Take 1 tablet (20 mEq total) by mouth 2 (two) times  daily. 90 tablet 1   trolamine salicylate (ASPERCREME) 10 % cream Apply 1 application topically as needed for muscle pain.     Multiple Vitamin (MULTIVITAMIN WITH MINERALS) TABS tablet Take 1 tablet by mouth daily. (Patient not taking: Reported on 06/29/2022) 90 tablet 0   traMADol (ULTRAM) 50 MG tablet Take One tab PO Q6 hours PRN pain (Patient not taking: Reported on 04/11/2022)     No current facility-administered medications for this visit.    OBJECTIVE: Vitals:   06/29/22 1030  BP: (!) 140/99  Pulse: 87  Resp: 18  Temp: (!) 96.6 F (35.9 C)     Body mass index is 24.36 kg/m.    ECOG FS:1 - Symptomatic but completely ambulatory  General: Thin, no acute distress.  Sitting in a  wheelchair. Eyes: Pink conjunctiva, anicteric sclera. HEENT: Normocephalic, moist mucous membranes. Lungs: No audible wheezing or coughing. Heart: Regular rate and rhythm. Abdomen: Soft, nontender, no obvious distention. Musculoskeletal: No edema, cyanosis, or clubbing. Neuro: Alert, answering all questions appropriately. Cranial nerves grossly intact. Skin: No rashes or petechiae noted. Psych: Normal affect.   LAB RESULTS:  Lab Results  Component Value Date   NA 135 06/29/2022   K 3.5 06/29/2022   CL 105 06/29/2022   CO2 22 06/29/2022   GLUCOSE 103 (H) 06/29/2022   BUN 15 06/29/2022   CREATININE 0.86 06/29/2022   CALCIUM 8.7 (L) 06/29/2022   PROT 9.2 (H) 06/29/2022   ALBUMIN 3.0 (L) 06/29/2022   AST 99 (H) 06/29/2022   ALT 31 06/29/2022   ALKPHOS 81 06/29/2022   BILITOT 1.2 06/29/2022   GFRNONAA >60 06/29/2022   GFRAA >60 03/07/2018    Lab Results  Component Value Date   WBC 3.5 (L) 06/29/2022   NEUTROABS 1.7 06/29/2022   HGB 13.2 06/29/2022   HCT 39.8 06/29/2022   MCV 101.3 (H) 06/29/2022   PLT 207 06/29/2022     STUDIES: No results found.  ASSESSMENT: Progressive stage IV bladder cancer.  PLAN:    Progressive stage IV bladder cancer: PET scan results from May 28, 2022 reviewed independently and reported as above with widespread metastatic disease.  Previously, patient was determined not to be a surgical candidate and proceeded with concurrent chemotherapy and XRT.  She received her fourth and last dose of cisplatin on August 04, 2021.  She subsequently completed XRT on August 19, 2021.  Patient had CT scan on Dec 20, 2021 that revealed mild abdominal lymphadenopathy.  Cystoscopy on January 07, 2022 did not reveal any evidence of recurrence.  Patient does not have a port.  Previously, after lengthy discussion which included hospice and comfort care, patient wishes to pursue systemic treatment.  Proceed with cycle 2 of single agent Keytruda today.  Return to clinic in 3 weeks for further evaluation and consideration of cycle 3.   Hypomagnesia: Chronic and unchanged.  Patient previously refused IV supplementation.  Continue oral magnesium supplementation.   Neutropenia: Resolved. Anemia: Resolved.   Thrombocytopenia: Resolved. A-fib: Patient states that she had a referral to cardiology, but did not keep this appointment.   Hypokalemia: Resolved.  Continue oral potassium supplementation.  Patient expressed understanding and was in agreement with this plan. She also understands that She can call clinic at any time with any questions, concerns, or complaints.    Cancer Staging  Bladder cancer [facility]) Staging form: Urinary Bladder, AJCC 8th Edition - Clinical stage from 06/04/2021: Stage II (cT2, cN0, cM0) - Signed by Lloyd Huger, MD on 06/04/2021 WHO/ISUP grade (low/high): High Grade Histologic grading system: 2 grade system  Lloyd Huger, MD   06/29/2022 12:20 PM

## 2022-06-29 NOTE — Progress Notes (Signed)
Appetite is fair. Eats small amounts of food. Last 2 days has had urinary frequency and has felt the urge to void. Pain is R  frozen shoulder . Right eye is all red this morning she wiped and rubbed it.

## 2022-06-30 LAB — T4: T4, Total: 14.4 ug/dL — ABNORMAL HIGH (ref 4.5–12.0)

## 2022-07-05 ENCOUNTER — Inpatient Hospital Stay: Payer: Medicare Other | Admitting: Hospice and Palliative Medicine

## 2022-07-09 ENCOUNTER — Inpatient Hospital Stay: Payer: Medicare Other | Attending: Oncology | Admitting: Hospice and Palliative Medicine

## 2022-07-09 DIAGNOSIS — Z515 Encounter for palliative care: Secondary | ICD-10-CM | POA: Diagnosis not present

## 2022-07-09 DIAGNOSIS — Z5112 Encounter for antineoplastic immunotherapy: Secondary | ICD-10-CM | POA: Insufficient documentation

## 2022-07-09 DIAGNOSIS — Z7901 Long term (current) use of anticoagulants: Secondary | ICD-10-CM | POA: Insufficient documentation

## 2022-07-09 DIAGNOSIS — Z79899 Other long term (current) drug therapy: Secondary | ICD-10-CM | POA: Insufficient documentation

## 2022-07-09 DIAGNOSIS — I4891 Unspecified atrial fibrillation: Secondary | ICD-10-CM | POA: Insufficient documentation

## 2022-07-09 DIAGNOSIS — C678 Malignant neoplasm of overlapping sites of bladder: Secondary | ICD-10-CM

## 2022-07-09 DIAGNOSIS — C679 Malignant neoplasm of bladder, unspecified: Secondary | ICD-10-CM | POA: Insufficient documentation

## 2022-07-09 NOTE — Progress Notes (Signed)
Virtual Visit via Telephone Note  I connected with Kristina Ellis on 07/09/22 at 11:30 AM EST by telephone and verified that I am speaking with the correct person using two identifiers.  Location: Patient: Home Provider: Clinic   I discussed the limitations, risks, security and privacy concerns of performing an evaluation and management service by telephone and the availability of in person appointments. I also discussed with the patient that there may be a patient responsible charge related to this service. The patient expressed understanding and agreed to proceed.   History of Present Illness: Kristina Ellis is a 71 y.o. female with multiple medical problems including A-fib, cholelithiasis, diastolic CHF, and pulmonary hypertension.  Patient has history of bladder cancer status post TURBT on 05/15/2021.  She declined cystectomy and instead opted for chemotherapy and radiation. Patient was hospitalized 04/11/2022 to 04/14/2022 initially thought to have cholecystitis but imaging including CT and MRI revealed ill-defined lesions throughout the liver suspicious for metastatic disease. Additionally, patient had elevated CEA and AFP.    Observations/Objective: I spoke with patient by phone.  She reports that she is doing reasonably well.  She has had occasional dizziness with ambulation but denies falls and is utilizing a cane to assist.  She feels like she is drinking adequate fluids and oral intake is reasonable.  Patient says that she had to small blood clots per rectum within the past month but none recently.  She is on Eliquis.  I note that she was thrombocytopenic but that has normalized.  We discussed that if this should recur or she was having high amounts of bleeding per rectum that she should be evaluated emergency department.  Otherwise, we could refer to GI if needed.  Assessment and Plan: Stage IV bladder cancer -patient is on single agent Keytruda and seems to be tolerating well.  There  are likely limited additional options for treatment and hospice has been previously offered.  Will continue to follow.  Follow Up Instructions: Follow-up telephone visit 1 to 2 months   I discussed the assessment and treatment plan with the patient. The patient was provided an opportunity to ask questions and all were answered. The patient agreed with the plan and demonstrated an understanding of the instructions.   The patient was advised to call back or seek an in-person evaluation if the symptoms worsen or if the condition fails to improve as anticipated.  I provided 5 minutes of non-face-to-face time during this encounter.   Irean Hong, NP

## 2022-07-16 ENCOUNTER — Telehealth: Payer: Self-pay

## 2022-07-16 NOTE — Telephone Encounter (Signed)
Nutrition  Patient identified on Malnutrition Screening report for weight loss and poor appetite.  Called patient but no answer or option to leave voicemail.    RD will plan to see patient during 1/2 infusion.  Kristina Ellis B. Zenia Resides, [place], [place] Registered Dietitian [phone]

## 2022-07-20 ENCOUNTER — Encounter: Payer: Self-pay | Admitting: Oncology

## 2022-07-20 ENCOUNTER — Ambulatory Visit: Payer: Medicare Other | Admitting: Oncology

## 2022-07-20 ENCOUNTER — Ambulatory Visit: Payer: Medicare Other

## 2022-07-20 ENCOUNTER — Inpatient Hospital Stay: Payer: Medicare Other

## 2022-07-20 ENCOUNTER — Inpatient Hospital Stay (HOSPITAL_BASED_OUTPATIENT_CLINIC_OR_DEPARTMENT_OTHER): Payer: Medicare Other | Admitting: Oncology

## 2022-07-20 ENCOUNTER — Other Ambulatory Visit: Payer: Medicare Other

## 2022-07-20 VITALS — BP 141/90 | HR 89 | Temp 97.6°F | Resp 18 | Wt 124.8 lb

## 2022-07-20 DIAGNOSIS — C678 Malignant neoplasm of overlapping sites of bladder: Secondary | ICD-10-CM

## 2022-07-20 DIAGNOSIS — I4891 Unspecified atrial fibrillation: Secondary | ICD-10-CM | POA: Diagnosis not present

## 2022-07-20 DIAGNOSIS — Z7901 Long term (current) use of anticoagulants: Secondary | ICD-10-CM | POA: Diagnosis not present

## 2022-07-20 DIAGNOSIS — I10 Essential (primary) hypertension: Secondary | ICD-10-CM | POA: Diagnosis not present

## 2022-07-20 DIAGNOSIS — Z87891 Personal history of nicotine dependence: Secondary | ICD-10-CM

## 2022-07-20 DIAGNOSIS — D709 Neutropenia, unspecified: Secondary | ICD-10-CM

## 2022-07-20 DIAGNOSIS — Z79899 Other long term (current) drug therapy: Secondary | ICD-10-CM | POA: Diagnosis not present

## 2022-07-20 DIAGNOSIS — D649 Anemia, unspecified: Secondary | ICD-10-CM

## 2022-07-20 DIAGNOSIS — D696 Thrombocytopenia, unspecified: Secondary | ICD-10-CM

## 2022-07-20 DIAGNOSIS — Z5112 Encounter for antineoplastic immunotherapy: Secondary | ICD-10-CM | POA: Diagnosis present

## 2022-07-20 DIAGNOSIS — R748 Abnormal levels of other serum enzymes: Secondary | ICD-10-CM

## 2022-07-20 DIAGNOSIS — C679 Malignant neoplasm of bladder, unspecified: Secondary | ICD-10-CM

## 2022-07-20 LAB — CBC WITH DIFFERENTIAL/PLATELET
Abs Immature Granulocytes: 0 10*3/uL (ref 0.00–0.07)
Basophils Absolute: 0 10*3/uL (ref 0.0–0.1)
Basophils Relative: 1 %
Eosinophils Absolute: 0 10*3/uL (ref 0.0–0.5)
Eosinophils Relative: 1 %
HCT: 32.5 % — ABNORMAL LOW (ref 36.0–46.0)
Hemoglobin: 10.8 g/dL — ABNORMAL LOW (ref 12.0–15.0)
Immature Granulocytes: 0 %
Lymphocytes Relative: 36 %
Lymphs Abs: 0.8 10*3/uL (ref 0.7–4.0)
MCH: 33.6 pg (ref 26.0–34.0)
MCHC: 33.2 g/dL (ref 30.0–36.0)
MCV: 101.2 fL — ABNORMAL HIGH (ref 80.0–100.0)
Monocytes Absolute: 0.3 10*3/uL (ref 0.1–1.0)
Monocytes Relative: 14 %
Neutro Abs: 1 10*3/uL — ABNORMAL LOW (ref 1.7–7.7)
Neutrophils Relative %: 48 %
Platelets: 142 10*3/uL — ABNORMAL LOW (ref 150–400)
RBC: 3.21 MIL/uL — ABNORMAL LOW (ref 3.87–5.11)
RDW: 16.6 % — ABNORMAL HIGH (ref 11.5–15.5)
WBC: 2.1 10*3/uL — ABNORMAL LOW (ref 4.0–10.5)
nRBC: 0 % (ref 0.0–0.2)

## 2022-07-20 LAB — COMPREHENSIVE METABOLIC PANEL
ALT: 57 U/L — ABNORMAL HIGH (ref 0–44)
AST: 158 U/L — ABNORMAL HIGH (ref 15–41)
Albumin: 2.2 g/dL — ABNORMAL LOW (ref 3.5–5.0)
Alkaline Phosphatase: 85 U/L (ref 38–126)
Anion gap: 9 (ref 5–15)
BUN: 13 mg/dL (ref 8–23)
CO2: 21 mmol/L — ABNORMAL LOW (ref 22–32)
Calcium: 8.1 mg/dL — ABNORMAL LOW (ref 8.9–10.3)
Chloride: 110 mmol/L (ref 98–111)
Creatinine, Ser: 0.73 mg/dL (ref 0.44–1.00)
GFR, Estimated: >60 mL/min (ref >60)
Glucose, Bld: 96 mg/dL (ref 70–99)
Potassium: 3.8 mmol/L (ref 3.5–5.1)
Sodium: 140 mmol/L (ref 135–145)
Total Bilirubin: 1.2 mg/dL (ref 0.3–1.2)
Total Protein: 7.4 g/dL (ref 6.5–8.1)

## 2022-07-20 LAB — TSH: TSH: 3.942 u[IU]/mL (ref 0.350–4.500)

## 2022-07-20 MED ORDER — SODIUM CHLORIDE 0.9 % IV SOLN
Freq: Once | INTRAVENOUS | Status: AC
  Filled 2022-07-20: qty 250

## 2022-07-20 MED ORDER — SODIUM CHLORIDE 0.9 % IV SOLN
200.0000 mg | Freq: Once | INTRAVENOUS | Status: AC
  Administered 2022-07-20: 200 mg via INTRAVENOUS
  Filled 2022-07-20: qty 8

## 2022-07-20 MED ORDER — HEPARIN SOD (PORK) LOCK FLUSH 100 UNIT/ML IV SOLN
500.0000 [IU] | Freq: Once | INTRAVENOUS | Status: AC | PRN
  Filled 2022-07-20: qty 5

## 2022-07-20 MED ORDER — SODIUM CHLORIDE 0.9% FLUSH
10.0000 mL | INTRAVENOUS | Status: AC | PRN
  Filled 2022-07-20: qty 10

## 2022-07-20 NOTE — Progress Notes (Signed)
[place]  [phone] [phone]) [phone]  ID: Kristina Ellis OB: 08/23/1950  MR#: [id number]  KWI#:[id number]  Patient Care Team: [facility], [place] as PCP - General (General Practice) Lloyd Huger, MD as Consulting Physician (Hematology and Oncology)  I connected with Kristina Ellis on 07/20/22 at  9:45 AM EST by video enabled telemedicine visit and verified that I am speaking with the correct person using two identifiers.   I discussed the limitations, risks, security and privacy concerns of performing an evaluation and management service by telemedicine and the availability of in-person appointments. I also discussed with the patient that there may be a patient responsible charge related to this service. The patient expressed understanding and agreed to proceed.   Other persons participating in the visit and their role in the encounter: Patient, MD.  Patient's location: Clinic. Provider's location: Home.  CHIEF COMPLAINT: Progressive stage IV bladder cancer.  INTERVAL HISTORY: Patient returns to clinic today for further evaluation and consideration of cycle 3 of single agent Keytruda.  She is tolerating her treatments well without significant side effects.  She has chronic weakness and fatigue.  Her diarrhea is better controlled.  She does not complain of any abdominal pain or discomfort today.  She has no neurologic complaints.  She denies any recent fevers or illnesses.  She has a fair appetite and denies weight loss.  She has no chest pain, cough, shortness of breath, or hemoptysis.  She denies any nausea, vomiting, or constipation.  She has no urinary complaints.  Patient offers no further specific complaints today.  REVIEW OF SYSTEMS:   Review of Systems  Constitutional:  Positive for malaise/fatigue. Negative for fever and weight loss.  Respiratory: Negative.  Negative for cough, hemoptysis and shortness of breath.    Cardiovascular: Negative.  Negative for chest pain and leg swelling.  Gastrointestinal:  Positive for diarrhea. Negative for abdominal pain.  Genitourinary:  Negative for hematuria.  Musculoskeletal:  Negative for back pain, falls and joint pain.  Skin: Negative.  Negative for rash.  Neurological:  Positive for weakness. Negative for dizziness, seizures and headaches.  Psychiatric/Behavioral: Negative.  The patient is not nervous/anxious.     As per HPI. Otherwise, a complete review of systems is negative.  PAST MEDICAL HISTORY: Past Medical History:  Diagnosis Date   Aortic atherosclerosis ([place])    Arthritis    Atrial fibrillation ([place])    a.) s/p TEE with cardioversion on 02/04/2020. b.) on daily apixaban.   Bladder mass    a.) CT 04/08/2021 --> 3.2 cm intraluminal bladder mass.   Carotid stenosis, bilateral    a.) Doppler 07/04/2020 --> mild; 1-49% stenosis BILATERALLY.   Cholelithiasis    a.) CT 11/06/2020 --> largest measured 4.5 cm   Chronic anticoagulation    a.) Apixaban   Common biliary duct calculus    a.) CT 04/08/2021 --> CBD dilated at 8 mm; 25m calculus within distal CBD.   Coronary artery disease involving native coronary artery of native heart without angina pectoris 52/42/6834  Diastolic dysfunction    a.) TTE 07/04/2020 --> LVEF 60-65%; G1DD.   Hepatic cirrhosis (HCC)    Hepatic steatosis    Hepatitis 1970   History of 2019 novel coronavirus disease (COVID-19) 12/20/2019   History of marijuana use    Hypertension    Hypertensive retinopathy    IDA (iron deficiency anemia)    Mitral stenosis    a.) TEE 04/10/2020 --> mild. b.) TTE  07/04/2020 --> EF 60-65%; mild (mean gradient 5 mmHg).   Moderate pulmonary arterial systolic hypertension ([place]) 12/22/2019   Nephrolithiasis    NSTEMI (non-ST elevated myocardial infarction) ([place]) 07/26/2016   Open-angle glaucoma 09/11/2010   PAH (pulmonary artery hypertension) ([place])    a.) TTE 12/21/2019 --> PASP 44 mmHg.    Uterine fibroid    a.) CT 04/08/2021 --> multiple with largest measuring 7 cm.   Valvular regurgitation    a.) TTE 09/01/2010 --> trivial to mild pan-valvular. b.) TTE 12/21/2019 --> mild MR and AR; moderate TR. c.) TTE 07/04/2020 --> mild TR; trivial MR and PR.   Vestibular neuronitis     PAST SURGICAL HISTORY: Past Surgical History:  Procedure Laterality Date   BREAST CYST ASPIRATION Left    COLONOSCOPY  2012   ERCP N/A 12/18/2021   Procedure: ENDOSCOPIC RETROGRADE CHOLANGIOPANCREATOGRAPHY (ERCP);  Surgeon: Lucilla Lame, MD;  Location: [facility] ENDOSCOPY;  Service: Endoscopy;  Laterality: N/A;   TEE WITH CARDIOVERSION N/A 02/04/2020   Procedure: TEE WITH CARDIOVERSION; Location: [facility]; Surgeon: Kandis Cocking, MD   TRANSURETHRAL RESECTION OF BLADDER TUMOR N/A 05/15/2021   Procedure: TRANSURETHRAL RESECTION OF BLADDER TUMOR (TURBT);  Surgeon: Billey Co, MD;  Location: [facility] ORS;  Service: Urology;  Laterality: N/A;    FAMILY HISTORY: Family History  Problem Relation Age of Onset   Aneurysm Mother    Colon cancer Father    Lung cancer Brother    Breast cancer Neg Hx     ADVANCED DIRECTIVES (Y/N):  N  HEALTH MAINTENANCE: Social History   Tobacco Use   Smoking status: Former    Packs/day: 0.10    Years: 10.00    Total pack years: 1.00    Types: Cigarettes    Passive exposure: Past   Smokeless tobacco: Never   Tobacco comments:    occasional smoker  Vaping Use   Vaping Use: Never used  Substance Use Topics   Alcohol use: Yes    Alcohol/week: 5.0 standard drinks of alcohol    Types: 5 Cans of beer per week    Comment: weekly   Drug use: Not Currently    Types: 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     atorvastatin (LIPITOR) 40 MG tablet Take 40 mg by mouth at bedtime.     diltiazem (CARDIZEM CD) 120 MG 24 hr capsule Take 1 capsule (120 mg total) by mouth daily. 30 capsule 0   ELIQUIS 5 MG TABS tablet Take 1 tablet (5 mg total) by mouth 2 (two) times daily. 60 tablet 1   magnesium oxide (MAG-OX) 400 MG tablet Take 1 tablet by mouth 2 (two) times daily.     metoprolol succinate (TOPROL-XL) 25 MG 24 hr tablet Take 1 tablet (25 mg total) by mouth 2 (two) times daily. Take with or immediately following a meal. 60 tablet 0   metoprolol tartrate (LOPRESSOR) 100 MG tablet Take 1 tablet by mouth 2 (two) times daily.     Multiple Vitamin (MULTIVITAMIN WITH MINERALS) TABS tablet Take 1 tablet by mouth daily. 90 tablet 0   potassium chloride SA (KLOR-CON M) 20 MEQ tablet Take 1 tablet (20 mEq total)  by mouth 2 (two) times daily. 90 tablet 1   traMADol (ULTRAM) 50 MG tablet      trolamine salicylate (ASPERCREME) 10 % cream Apply 1 application topically as needed for muscle pain.     No current facility-administered medications for this visit.   Facility-Administered Medications Ordered in Other Visits  Medication Dose Route Frequency Provider Last Rate Last Admin   0.9 %  sodium chloride infusion   Intravenous Once Grayland Ormond, Kathlene November, MD       heparin lock flush 100 unit/mL  500 Units Intracatheter Once PRN Lloyd Huger, MD       pembrolizumab [facility]) 200 mg in sodium chloride 0.9 % 50 mL chemo infusion  200 mg Intravenous Once Lloyd Huger, MD       sodium chloride flush (NS) 0.9 % injection 10 mL  10 mL Intracatheter PRN Lloyd Huger, MD        OBJECTIVE: Vitals:   07/20/22 1007  BP: (!) 141/90  Pulse: 89  Resp: 18  Temp: 97.6 F (36.4 C)  SpO2: 100%     Body mass index is 25.21 kg/m.    ECOG FS:1 - Symptomatic but completely ambulatory  General: Thin, no acute distress.  Sitting in a wheelchair. HEENT: Normocephalic. Neuro: Alert,  answering all questions appropriately. Cranial nerves grossly intact. Psych: Normal affect.  LAB RESULTS:  Lab Results  Component Value Date   NA 140 07/20/2022   K 3.8 07/20/2022   CL 110 07/20/2022   CO2 21 (L) 07/20/2022   GLUCOSE 96 07/20/2022   BUN 13 07/20/2022   CREATININE 0.73 07/20/2022   CALCIUM 8.1 (L) 07/20/2022   PROT 7.4 07/20/2022   ALBUMIN 2.2 (L) 07/20/2022   AST 158 (H) 07/20/2022   ALT 57 (H) 07/20/2022   ALKPHOS 85 07/20/2022   BILITOT 1.2 07/20/2022   GFRNONAA >60 07/20/2022   GFRAA >60 03/07/2018    Lab Results  Component Value Date   WBC 2.1 (L) 07/20/2022   NEUTROABS 1.0 (L) 07/20/2022   HGB 10.8 (L) 07/20/2022   HCT 32.5 (L) 07/20/2022   MCV 101.2 (H) 07/20/2022   PLT 142 (L) 07/20/2022     STUDIES: No results found.  ASSESSMENT: Progressive stage IV bladder cancer.  PLAN:    Progressive stage IV bladder cancer: PET scan results from May 28, 2022 reviewed independently and reported as above with widespread metastatic disease.  Previously, patient was determined not to be a surgical candidate and proceeded with concurrent chemotherapy and XRT.  She received her fourth and last dose of cisplatin on August 04, 2021.  She subsequently completed XRT on August 19, 2021.  Patient had CT scan on Dec 20, 2021 that revealed mild abdominal lymphadenopathy.  Cystoscopy on January 07, 2022 did not reveal any evidence of recurrence.  Patient does not have a port.  Previously, after lengthy discussion which included hospice and comfort care, patient wishes to pursue systemic treatment.  Proceed with cycle 3 of single agent Keytruda today.  Return to clinic in 1 week for laboratory work only and then in 3 weeks for further evaluation and consideration of cycle 4.  Will repeat imaging in January 2024. Hypomagnesia: Chronic and unchanged.  Patient previously refused IV supplementation.  Continue oral magnesium supplementation.   Neutropenia: Mild.  Patient's ANC  is 1.0 today.   Anemia: Hemoglobin has trended down to 10.8, monitor. Thrombocytopenia: Mild, monitor. A-fib: Patient states that she had a referral to cardiology, but did not  keep this appointment.   Hypokalemia: Resolved.  Continue oral potassium supplementation. Transaminitis: Patient's AST and ALT are mildly elevated.  Proceed with treatment as above.  Laboratory work in 1 week.  Patient expressed understanding and was in agreement with this plan. She also understands that She can call clinic at any time with any questions, concerns, or complaints.    Cancer Staging  Bladder cancer [facility]) Staging form: Urinary Bladder, AJCC 8th Edition - Clinical stage from 06/04/2021: Stage II (cT2, cN0, cM0) - Signed by Lloyd Huger, MD on 06/04/2021 WHO/ISUP grade (low/high): High Grade Histologic grading system: 2 grade system  Lloyd Huger, MD   07/20/2022 11:16 AM

## 2022-07-20 NOTE — Patient Instructions (Signed)
[facility] [place]  Discharge Instructions: Thank you for choosing [facility] to provide your oncology and hematology care.  If you have a lab appointment with the Cancer Center, please go directly to the Cancer Center and check in at the registration area.  Wear comfortable clothing and clothing appropriate for easy access to any Portacath or PICC line.   We strive to give you quality time with your provider. You may need to reschedule your appointment if you arrive late (15 or more minutes).  Arriving late affects you and other patients whose appointments are after yours.  Also, if you miss three or more appointments without notifying the office, you may be dismissed from the clinic at the provider's discretion.      For prescription refill requests, have your pharmacy contact our office and allow 72 hours for refills to be completed.    Today you received the following chemotherapy and/or immunotherapy agents Keytruda      To help prevent nausea and vomiting after your treatment, we encourage you to take your nausea medication as directed.  BELOW ARE SYMPTOMS THAT SHOULD BE REPORTED IMMEDIATELY: *FEVER GREATER THAN 100.4 F (38 C) OR HIGHER *CHILLS OR SWEATING *NAUSEA AND VOMITING THAT IS NOT CONTROLLED WITH YOUR NAUSEA MEDICATION *UNUSUAL SHORTNESS OF BREATH *UNUSUAL BRUISING OR BLEEDING *URINARY PROBLEMS (pain or burning when urinating, or frequent urination) *BOWEL PROBLEMS (unusual diarrhea, constipation, pain near the anus) TENDERNESS IN MOUTH AND THROAT WITH OR WITHOUT PRESENCE OF ULCERS (sore throat, sores in mouth, or a toothache) UNUSUAL RASH, SWELLING OR PAIN  UNUSUAL VAGINAL DISCHARGE OR ITCHING   Items with * indicate a potential emergency and should be followed up as soon as possible or go to the Emergency Department if any problems should occur.  Please show the CHEMOTHERAPY ALERT CARD or IMMUNOTHERAPY ALERT CARD at check-in to  the Emergency Department and triage nurse.  Should you have questions after your visit or need to cancel or reschedule your appointment, please contact [facility] [place]  [phone] and follow the prompts.  Office hours are 8:00 a.m. to 4:30 p.m. Monday - Friday. Please note that voicemails left after 4:00 p.m. may not be returned until the following business day.  We are closed weekends and major holidays. You have access to a nurse at all times for urgent questions. Please call the main number to the clinic [phone] and follow the prompts.  For any non-urgent questions, you may also contact your provider using MyChart. We now offer e-Visits for anyone 18 and older to request care online for non-urgent symptoms. For details visit [email or web address].   Also download the MyChart app! Go to the app store, search "MyChart", open the app, select [facility] [place], and log in with your MyChart username and password.  Masks are optional in the cancer centers. If you would like for your care team to wear a mask while they are taking care of you, please let them know. For doctor visits, patients may have with them one support person who is at least 71 years old. At this time, visitors are not allowed in the infusion area.   

## 2022-07-20 NOTE — Progress Notes (Signed)
Has incontinence at times of bowel and bladder. Appetite is fair. Low energy.Bilateral shoulder pain. Denies any blood in urine, does have some stinging with urination. AST 158 today. OK to proceed with Tx per MD. Will recheck labs next week.

## 2022-07-21 LAB — T4: T4, Total: 13.4 ug/dL — ABNORMAL HIGH (ref 4.5–12.0)

## 2022-07-27 ENCOUNTER — Inpatient Hospital Stay: Payer: Medicare Other

## 2022-07-27 DIAGNOSIS — R748 Abnormal levels of other serum enzymes: Secondary | ICD-10-CM

## 2022-07-27 DIAGNOSIS — Z5112 Encounter for antineoplastic immunotherapy: Secondary | ICD-10-CM | POA: Diagnosis not present

## 2022-07-27 LAB — COMPREHENSIVE METABOLIC PANEL
ALT: 49 U/L — ABNORMAL HIGH (ref 0–44)
AST: 132 U/L — ABNORMAL HIGH (ref 15–41)
Albumin: 2.1 g/dL — ABNORMAL LOW (ref 3.5–5.0)
Alkaline Phosphatase: 80 U/L (ref 38–126)
Anion gap: 7 (ref 5–15)
BUN: 11 mg/dL (ref 8–23)
CO2: 23 mmol/L (ref 22–32)
Calcium: 7.8 mg/dL — ABNORMAL LOW (ref 8.9–10.3)
Chloride: 109 mmol/L (ref 98–111)
Creatinine, Ser: 0.92 mg/dL (ref 0.44–1.00)
GFR, Estimated: >60 mL/min (ref >60)
Glucose, Bld: 114 mg/dL — ABNORMAL HIGH (ref 70–99)
Potassium: 3.6 mmol/L (ref 3.5–5.1)
Sodium: 139 mmol/L (ref 135–145)
Total Bilirubin: 1.5 mg/dL — ABNORMAL HIGH (ref 0.3–1.2)
Total Protein: 7 g/dL (ref 6.5–8.1)

## 2022-07-29 ENCOUNTER — Ambulatory Visit (INDEPENDENT_AMBULATORY_CARE_PROVIDER_SITE_OTHER): Payer: Medicare Other | Admitting: Urology

## 2022-07-29 VITALS — BP 135/82 | HR 103

## 2022-07-29 DIAGNOSIS — C670 Malignant neoplasm of trigone of bladder: Secondary | ICD-10-CM

## 2022-07-29 DIAGNOSIS — R3989 Other symptoms and signs involving the genitourinary system: Secondary | ICD-10-CM | POA: Diagnosis not present

## 2022-07-29 LAB — URINALYSIS, COMPLETE
Bilirubin, UA: NEGATIVE
Glucose, UA: NEGATIVE
Nitrite, UA: NEGATIVE
Specific Gravity, UA: 1.02 (ref 1.005–1.030)
Urobilinogen, Ur: 4 mg/dL — ABNORMAL HIGH (ref 0.2–1.0)
pH, UA: 6.5 (ref 5.0–7.5)

## 2022-07-29 LAB — MICROSCOPIC EXAMINATION: Epithelial Cells (non renal): >10 /hpf — AB (ref 0–10)

## 2022-07-29 NOTE — Progress Notes (Signed)
   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 She deferred referral to consider cystectomy and urinary diversion, and opted for tri-modal therapy with chemotherapy(cisplatin x4) and radiation(XRT completed January 2023).  PET scan in November 2022 showed no evidence of metastatic disease.  Most recent cystoscopy from 01/13/2022 showed no abnormal findings within the bladder, and cytology was benign.   Unfortunately she developed abdominal pain and recent imaging has suggested extensive metastatic disease which was confirmed on PET scan, most likely bladder in origin, but has not been biopsied.  She is followed by oncology with Dr. Grayland Ormond, and currently undergoing treatments with pembrolizumab.  I personally viewed and interpreted those images, including the PET scan from October 2023.  Her primary complaint today is some urgency, frequency, nocturia 4-6 times overnight, with some occasional dysuria.  She denies any gross hematuria.  Urinalysis today is pending.  We discussed possible etiologies including infection, sequela of radiation, recurrence of bladder cancer locally, or overactive bladder.  If urinalysis benign, will try oxybutynin 10 mg XL RTC 6 months symptom check     Billey Co, MD  [facility] [address] [place], [place] [address] [phone]

## 2022-08-02 LAB — CULTURE, URINE COMPREHENSIVE

## 2022-08-05 ENCOUNTER — Telehealth: Payer: Self-pay

## 2022-08-05 DIAGNOSIS — N3281 Overactive bladder: Secondary | ICD-10-CM

## 2022-08-05 MED ORDER — OXYBUTYNIN CHLORIDE ER 10 MG PO TB24: 10.0000 mg | ORAL_TABLET | Freq: Every day | ORAL | 11 refills | Status: DC

## 2022-08-05 NOTE — Telephone Encounter (Signed)
Called pt no answer. Unable to leave message. 1st attempt. RX sent.

## 2022-08-05 NOTE — Telephone Encounter (Signed)
-----   Message from Billey Co, MD sent at 08/05/2022  8:18 AM EST ----- No UTI, please start oxybutynin '10mg'$  xl daily, keep follow up as scheduled  Nickolas Madrid, MD 08/05/2022

## 2022-08-06 NOTE — Telephone Encounter (Signed)
Called pt informed her of the information below. Pt voiced understanding.  

## 2022-08-10 ENCOUNTER — Inpatient Hospital Stay: Payer: 59 | Attending: Oncology

## 2022-08-10 ENCOUNTER — Encounter: Payer: Self-pay | Admitting: Oncology

## 2022-08-10 ENCOUNTER — Inpatient Hospital Stay (HOSPITAL_BASED_OUTPATIENT_CLINIC_OR_DEPARTMENT_OTHER): Payer: 59 | Admitting: Oncology

## 2022-08-10 ENCOUNTER — Inpatient Hospital Stay: Payer: 59

## 2022-08-10 ENCOUNTER — Other Ambulatory Visit: Payer: Self-pay | Admitting: Oncology

## 2022-08-10 DIAGNOSIS — D649 Anemia, unspecified: Secondary | ICD-10-CM | POA: Insufficient documentation

## 2022-08-10 DIAGNOSIS — C679 Malignant neoplasm of bladder, unspecified: Secondary | ICD-10-CM | POA: Insufficient documentation

## 2022-08-10 DIAGNOSIS — Z87891 Personal history of nicotine dependence: Secondary | ICD-10-CM | POA: Diagnosis not present

## 2022-08-10 DIAGNOSIS — C678 Malignant neoplasm of overlapping sites of bladder: Secondary | ICD-10-CM

## 2022-08-10 DIAGNOSIS — I4891 Unspecified atrial fibrillation: Secondary | ICD-10-CM | POA: Insufficient documentation

## 2022-08-10 DIAGNOSIS — I10 Essential (primary) hypertension: Secondary | ICD-10-CM | POA: Diagnosis not present

## 2022-08-10 DIAGNOSIS — E876 Hypokalemia: Secondary | ICD-10-CM | POA: Insufficient documentation

## 2022-08-10 DIAGNOSIS — Z923 Personal history of irradiation: Secondary | ICD-10-CM | POA: Diagnosis not present

## 2022-08-10 DIAGNOSIS — Z5112 Encounter for antineoplastic immunotherapy: Secondary | ICD-10-CM | POA: Diagnosis present

## 2022-08-10 DIAGNOSIS — D709 Neutropenia, unspecified: Secondary | ICD-10-CM | POA: Insufficient documentation

## 2022-08-10 LAB — CBC WITH DIFFERENTIAL/PLATELET
Abs Immature Granulocytes: 0.01 10*3/uL (ref 0.00–0.07)
Basophils Absolute: 0 10*3/uL (ref 0.0–0.1)
Basophils Relative: 1 %
Eosinophils Absolute: 0 10*3/uL (ref 0.0–0.5)
Eosinophils Relative: 1 %
HCT: 29.1 % — ABNORMAL LOW (ref 36.0–46.0)
Hemoglobin: 10 g/dL — ABNORMAL LOW (ref 12.0–15.0)
Immature Granulocytes: 1 %
Lymphocytes Relative: 32 %
Lymphs Abs: 0.5 10*3/uL — ABNORMAL LOW (ref 0.7–4.0)
MCH: 35 pg — ABNORMAL HIGH (ref 26.0–34.0)
MCHC: 34.4 g/dL (ref 30.0–36.0)
MCV: 101.7 fL — ABNORMAL HIGH (ref 80.0–100.0)
Monocytes Absolute: 0.2 10*3/uL (ref 0.1–1.0)
Monocytes Relative: 13 %
Neutro Abs: 0.8 10*3/uL — ABNORMAL LOW (ref 1.7–7.7)
Neutrophils Relative %: 52 %
Platelets: 110 10*3/uL — ABNORMAL LOW (ref 150–400)
RBC: 2.86 MIL/uL — ABNORMAL LOW (ref 3.87–5.11)
RDW: 17.1 % — ABNORMAL HIGH (ref 11.5–15.5)
WBC: 1.5 10*3/uL — ABNORMAL LOW (ref 4.0–10.5)
nRBC: 0 % (ref 0.0–0.2)

## 2022-08-10 LAB — COMPREHENSIVE METABOLIC PANEL
ALT: 35 U/L (ref 0–44)
AST: 77 U/L — ABNORMAL HIGH (ref 15–41)
Albumin: 2 g/dL — ABNORMAL LOW (ref 3.5–5.0)
Alkaline Phosphatase: 83 U/L (ref 38–126)
Anion gap: 7 (ref 5–15)
BUN: 13 mg/dL (ref 8–23)
CO2: 22 mmol/L (ref 22–32)
Calcium: 7.7 mg/dL — ABNORMAL LOW (ref 8.9–10.3)
Chloride: 109 mmol/L (ref 98–111)
Creatinine, Ser: 0.78 mg/dL (ref 0.44–1.00)
GFR, Estimated: >60 mL/min (ref >60)
Glucose, Bld: 120 mg/dL — ABNORMAL HIGH (ref 70–99)
Potassium: 2.8 mmol/L — ABNORMAL LOW (ref 3.5–5.1)
Sodium: 138 mmol/L (ref 135–145)
Total Bilirubin: 1.3 mg/dL — ABNORMAL HIGH (ref 0.3–1.2)
Total Protein: 6.9 g/dL (ref 6.5–8.1)

## 2022-08-10 MED ORDER — SODIUM CHLORIDE 0.9 % IV SOLN
200.0000 mg | Freq: Once | INTRAVENOUS | Status: AC
  Administered 2022-08-10: 200 mg via INTRAVENOUS
  Filled 2022-08-10: qty 200

## 2022-08-10 MED ORDER — SODIUM CHLORIDE 0.9 % IV SOLN
40.0000 meq | Freq: Once | INTRAVENOUS | Status: DC
  Filled 2022-08-10: qty 20

## 2022-08-10 MED ORDER — SODIUM CHLORIDE 0.9 % IV SOLN
20.0000 meq | Freq: Once | INTRAVENOUS | Status: AC
  Administered 2022-08-10: 20 meq via INTRAVENOUS
  Filled 2022-08-10: qty 10

## 2022-08-10 MED ORDER — SODIUM CHLORIDE 0.9 % IV SOLN
Freq: Once | INTRAVENOUS | Status: AC
  Filled 2022-08-10: qty 250

## 2022-08-10 NOTE — Patient Instructions (Signed)
[facility] AT [place]  Discharge Instructions: Thank you for choosing [facility] to provide your oncology and hematology care.  If you have a lab appointment with the [facility], please go directly to the [facility] and check in at the registration area.  Wear comfortable clothing and clothing appropriate for easy access to any Portacath or PICC line.   We strive to give you quality time with your provider. You may need to reschedule your appointment if you arrive late (15 or more minutes).  Arriving late affects you and other patients whose appointments are after yours.  Also, if you miss three or more appointments without notifying the office, you may be dismissed from the clinic at the provider's discretion.      For prescription refill requests, have your pharmacy contact our office and allow 72 hours for refills to be completed.    Today you received the following chemotherapy and/or immunotherapy agents Keytruda      To help prevent nausea and vomiting after your treatment, we encourage you to take your nausea medication as directed.  BELOW ARE SYMPTOMS THAT SHOULD BE REPORTED IMMEDIATELY: *FEVER GREATER THAN 100.4 F (38 C) OR HIGHER *CHILLS OR SWEATING *NAUSEA AND VOMITING THAT IS NOT CONTROLLED WITH YOUR NAUSEA MEDICATION *UNUSUAL SHORTNESS OF BREATH *UNUSUAL BRUISING OR BLEEDING *URINARY PROBLEMS (pain or burning when urinating, or frequent urination) *BOWEL PROBLEMS (unusual diarrhea, constipation, pain near the anus) TENDERNESS IN MOUTH AND THROAT WITH OR WITHOUT PRESENCE OF ULCERS (sore throat, sores in mouth, or a toothache) UNUSUAL RASH, SWELLING OR PAIN  UNUSUAL VAGINAL DISCHARGE OR ITCHING   Items with * indicate a potential emergency and should be followed up as soon as possible or go to the Emergency Department if any problems should occur.  Please show the CHEMOTHERAPY ALERT CARD or IMMUNOTHERAPY ALERT CARD at check-in to  the Emergency Department and triage nurse.  Should you have questions after your visit or need to cancel or reschedule your appointment, please contact [facility] [place] AT [place]  [phone] and follow the prompts.  Office hours are 8:00 a.m. to 4:30 p.m. Monday - Friday. Please note that voicemails left after 4:00 p.m. may not be returned until the following business day.  We are closed weekends and major holidays. You have access to a nurse at all times for urgent questions. Please call the main number to the clinic [phone] and follow the prompts.  For any non-urgent questions, you may also contact your provider using MyChart. We now offer e-Visits for anyone 2 and older to request care online for non-urgent symptoms. For details visit [email or web address].   Also download the MyChart app! Go to the app store, search "MyChart", open the app, select [facility], and log in with your MyChart username and password.  Masks are optional in the cancer centers. If you would like for your care team to wear a mask while they are taking care of you, please let them know. For doctor visits, patients may have with them one support person who is at least 72 years old. At this time, visitors are not allowed in the infusion area. Hypokalemia Hypokalemia means that the amount of potassium in the blood is lower than normal. Potassium is a mineral (electrolyte) that helps regulate the amount of fluid in the body. It also stimulates muscle tightening (contraction) and helps nerves work properly. Normally, most of the body's potassium is inside cells, and only a very small amount is  in the blood. Because the amount in the blood is so small, minor changes to potassium levels in the blood can be life-threatening. What are the causes? This condition may be caused by: Antibiotic medicine. Diarrhea or vomiting. Taking too much of a medicine that helps you have a bowel movement (laxative) can  cause diarrhea and lead to hypokalemia. Chronic kidney disease (CKD). Medicines that help the body get rid of excess fluid (diuretics). Eating disorders, such as anorexia or bulimia. Low magnesium levels in the body. Sweating a lot. What are the signs or symptoms? Symptoms of this condition include: Weakness. Constipation. Fatigue. Muscle cramps. Mental confusion. Skipped heartbeats or irregular heartbeat (palpitations). Tingling or numbness. How is this diagnosed? This condition is diagnosed with a blood test. How is this treated? This condition may be treated by: Taking potassium supplements. Adjusting the medicines that you take. Eating more foods that contain a lot of potassium. If your potassium level is very low, you may need to get potassium through an IV and be monitored in the hospital. Follow these instructions at home: Eating and drinking  Eat a healthy diet. A healthy diet includes fresh fruits and vegetables, whole grains, healthy fats, and lean proteins. If told, eat more foods that contain a lot of potassium. These include: Nuts, such as peanuts and pistachios. Seeds, such as sunflower seeds and pumpkin seeds. Peas, lentils, and lima beans. Whole grain and bran cereals and breads. Fresh fruits and vegetables, such as apricots, avocado, bananas, cantaloupe, kiwi, oranges, tomatoes, asparagus, and potatoes. Juices, such as orange, tomato, and prune. Lean meats, including fish. Milk and milk products, such as yogurt. General instructions Take over-the-counter and prescription medicines only as told by your health care provider. This includes vitamins, natural food products, and supplements. Keep all follow-up visits. This is important. Contact a health care provider if: You have weakness that gets worse. You feel your heart pounding or racing. You vomit. You have diarrhea. You have diabetes and you have trouble keeping your blood sugar in your target  range. Get help right away if: You have chest pain. You have shortness of breath. You have vomiting or diarrhea that lasts for more than 2 days. You faint. These symptoms may be an emergency. Get help right away. Call 911. Do not wait to see if the symptoms will go away. Do not drive yourself to the hospital. Summary Hypokalemia means that the amount of potassium in the blood is lower than normal. This condition is diagnosed with a blood test. Hypokalemia may be treated by taking potassium supplements, adjusting the medicines that you take, or eating more foods that are high in potassium. If your potassium level is very low, you may need to get potassium through an IV and be monitored in the hospital. This information is not intended to replace advice given to you by your health care provider. Make sure you discuss any questions you have with your health care provider. Document Revised: 04/09/2021 Document Reviewed: 04/09/2021 Elsevier Patient Education  [place].

## 2022-08-10 NOTE — Progress Notes (Signed)
Nutrition Assessment   Reason for Assessment:  Patient identified on Malnutrition Screening report for weight loss   ASSESSMENT:  72 year old female with stage IV bladder cancer.  Past medical history of afib, CHF, HTN, TURBT on 05/15/21.  Patient currently on pembrolizumab.  Met with patient during infusion. Reports that she is eating about 50% of usual intake.  For breakfast usually has sandwich or sausage biscuit to take medications.  Around 1-2pm usually has another sandwich or hot dog. Doesn't eat much in the evening.  Had teeth pulled about 2 years ago and eats soft foods.  Son lives with her and cooks.  Likes most foods.  Says that boost gives her diarrhea.     Medications: MVI, KCL, mag ox   Labs: K 2.8, glucose 120   Anthropometrics:   Height: 4'11" Weight: 121 lb today 138 lb on 08/09/2021 BMI: 24  12%weight loss in the last year   Estimated Energy Needs  Kcals: 1375-1650 Protein: 69-83 g Fluid: 1375-1650 ml   NUTRITION DIAGNOSIS: Inadequate oral intake related to cancer treatment, poor dentition as evidenced by 12% weight loss in the last year and decreased intake    INTERVENTION:  Encouraged protein food at every meal.  Discussed foods high in protein Discussed importance of good nutrition and weight maintenance Contact information provided   MONITORING, EVALUATION, GOAL: weight trends, intake   Next Visit: ~ 3 weeks during infusion  Kristina Ellis, [place], [place] Registered Dietitian [phone]

## 2022-08-10 NOTE — Progress Notes (Signed)
[facility]  Telephone:([phone] [phone]) [phone]  ID: Kristina Ellis OB: Apr 09, 1951  MR#: [id number]  OZD#:[id number]  Patient Care Team: [facility] [place] as PCP - General (General Practice) Lloyd Huger, MD as Consulting Physician (Hematology and Oncology)   CHIEF COMPLAINT: Progressive stage IV bladder cancer.  INTERVAL HISTORY: Patient returns to clinic today for further evaluation and consideration of cycle 4 of single agent Keytruda.  She continues to tolerate her treatments well without significant side effects.  She continues to have chronic weakness and fatigue.  Her diarrhea is better controlled.  She does not complain of any abdominal pain or discomfort today.  She has no neurologic complaints.  She denies any recent fevers or illnesses.  She has a fair appetite and denies weight loss.  She has no chest pain, cough, shortness of breath, or hemoptysis.  She denies any nausea, vomiting, or constipation.  She has no urinary complaints.  Patient offers no further specific complaints today.  REVIEW OF SYSTEMS:   Review of Systems  Constitutional:  Positive for malaise/fatigue. Negative for fever and weight loss.  Respiratory: Negative.  Negative for cough, hemoptysis and shortness of breath.   Cardiovascular: Negative.  Negative for chest pain and leg swelling.  Gastrointestinal: Negative.  Negative for abdominal pain and diarrhea.  Genitourinary:  Positive for urgency. Negative for hematuria.  Musculoskeletal:  Negative for back pain, falls and joint pain.  Skin: Negative.  Negative for rash.  Neurological:  Positive for weakness. Negative for dizziness, seizures and headaches.  Psychiatric/Behavioral: Negative.  The patient is not nervous/anxious.     As per HPI. Otherwise, a complete review of systems is negative.  PAST MEDICAL HISTORY: Past Medical History:  Diagnosis Date   Aortic atherosclerosis ([place])    Arthritis     Atrial fibrillation ([place])    a.) s/p TEE with cardioversion on 02/04/2020. b.) on daily apixaban.   Bladder mass    a.) CT 04/08/2021 --> 3.2 cm intraluminal bladder mass.   Carotid stenosis, bilateral    a.) Doppler 07/04/2020 --> mild; 1-49% stenosis BILATERALLY.   Cholelithiasis    a.) CT 11/06/2020 --> largest measured 4.5 cm   Chronic anticoagulation    a.) Apixaban   Common biliary duct calculus    a.) CT 04/08/2021 --> CBD dilated at 8 mm; 94m calculus within distal CBD.   Coronary artery disease involving native coronary artery of native heart without angina pectoris 52/59/5638  Diastolic dysfunction    a.) TTE 07/04/2020 --> LVEF 60-65%; G1DD.   Hepatic cirrhosis (HCC)    Hepatic steatosis    Hepatitis 1970   History of 2019 novel coronavirus disease (COVID-19) 12/20/2019   History of marijuana use    Hypertension    Hypertensive retinopathy    IDA (iron deficiency anemia)    Mitral stenosis    a.) TEE 04/10/2020 --> mild. b.) TTE 07/04/2020 --> EF 60-65%; mild (mean gradient 5 mmHg).   Moderate pulmonary arterial systolic hypertension ([facility] 12/22/2019   Nephrolithiasis    NSTEMI (non-ST elevated myocardial infarction) ([facility] 07/26/2016   Open-angle glaucoma 09/11/2010   PAH (pulmonary artery hypertension) ([facility]    a.) TTE 12/21/2019 --> PASP 44 mmHg.   Uterine fibroid    a.) CT 04/08/2021 --> multiple with largest measuring 7 cm.   Valvular regurgitation    a.) TTE 09/01/2010 --> trivial to mild pan-valvular. b.) TTE 12/21/2019 --> mild MR and AR; moderate TR. c.) TTE 07/04/2020 --> mild  TR; trivial MR and PR.   Vestibular neuronitis     PAST SURGICAL HISTORY: Past Surgical History:  Procedure Laterality Date   BREAST CYST ASPIRATION Left    COLONOSCOPY  2012   ERCP N/A 12/18/2021   Procedure: ENDOSCOPIC RETROGRADE CHOLANGIOPANCREATOGRAPHY (ERCP);  Surgeon: Lucilla Lame, MD;  Location: [facility] ENDOSCOPY;  Service: Endoscopy;  Laterality: N/A;   TEE WITH CARDIOVERSION  N/A 02/04/2020   Procedure: TEE WITH CARDIOVERSION; Location: [facility]; Surgeon: Kandis Cocking, MD   TRANSURETHRAL RESECTION OF BLADDER TUMOR N/A 05/15/2021   Procedure: TRANSURETHRAL RESECTION OF BLADDER TUMOR (TURBT);  Surgeon: Billey Co, MD;  Location: [facility] ORS;  Service: Urology;  Laterality: N/A;    FAMILY HISTORY: Family History  Problem Relation Age of Onset   Aneurysm Mother    Colon cancer Father    Lung cancer Brother    Breast cancer Neg Hx     ADVANCED DIRECTIVES (Y/N):  N  HEALTH MAINTENANCE: Social History   Tobacco Use   Smoking status: Former    Packs/day: 0.10    Years: 10.00    Total pack years: 1.00    Types: Cigarettes    Passive exposure: Past   Smokeless tobacco: Never   Tobacco comments:    occasional smoker  Vaping Use   Vaping Use: Never used  Substance Use Topics   Alcohol use: Yes    Alcohol/week: 5.0 standard drinks of alcohol    Types: 5 Cans of beer per week    Comment: weekly   Drug use: Not Currently    Types: 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     atorvastatin (LIPITOR) 40 MG tablet Take 40 mg by mouth at bedtime.     diltiazem (CARDIZEM CD) 120 MG 24 hr capsule Take 1 capsule (120 mg total) by mouth daily. 30 capsule 0   ELIQUIS 5 MG TABS tablet Take 1 tablet (5 mg total) by mouth 2 (two) times daily. 60 tablet 1   magnesium oxide (MAG-OX) 400 MG tablet Take 1 tablet by mouth 2 (two) times daily.     metoprolol succinate (TOPROL-XL) 25 MG 24 hr tablet Take 1 tablet (25 mg total) by mouth 2 (two) times daily. Take with or immediately following a meal. 60 tablet 0   metoprolol tartrate (LOPRESSOR) 100 MG tablet Take 1 tablet by mouth 2 (two)  times daily.     Multiple Vitamin (MULTIVITAMIN WITH MINERALS) TABS tablet Take 1 tablet by mouth daily. 90 tablet 0   oxybutynin (DITROPAN-XL) 10 MG 24 hr tablet Take 1 tablet (10 mg total) by mouth daily. 30 tablet 11   potassium chloride SA (KLOR-CON M) 20 MEQ tablet Take 1 tablet (20 mEq total) by mouth 2 (two) times daily. 90 tablet 1   trolamine salicylate (ASPERCREME) 10 % cream Apply 1 application topically as needed for muscle pain.     No current facility-administered medications for this visit.   Facility-Administered Medications Ordered in Other Visits  Medication Dose Route Frequency Provider Last Rate Last Admin   heparin lock flush 100 unit/mL  500 Units Intracatheter Once PRN Lloyd Huger, MD       sodium  chloride flush (NS) 0.9 % injection 10 mL  10 mL Intracatheter PRN Lloyd Huger, MD        OBJECTIVE: Vitals:   08/10/22 1122  BP: 126/89  Pulse: 85  Resp: 18  Temp: (!) 96.5 F (35.8 C)  SpO2: 100%     Body mass index is 24.44 kg/m.    ECOG FS:1 - Symptomatic but completely ambulatory  General: Thin, no acute distress.  Sitting in a wheelchair. Eyes: Pink conjunctiva, anicteric sclera. HEENT: Normocephalic, moist mucous membranes. Lungs: No audible wheezing or coughing. Heart: Regular rate and rhythm. Abdomen: Soft, nontender, no obvious distention. Musculoskeletal: No edema, cyanosis, or clubbing. Neuro: Alert, answering all questions appropriately. Cranial nerves grossly intact. Skin: No rashes or petechiae noted. Psych: Normal affect.   LAB RESULTS:  Lab Results  Component Value Date   NA 138 08/10/2022   K 2.8 (L) 08/10/2022   CL 109 08/10/2022   CO2 22 08/10/2022   GLUCOSE 120 (H) 08/10/2022   BUN 13 08/10/2022   CREATININE 0.78 08/10/2022   CALCIUM 7.7 (L) 08/10/2022   PROT 6.9 08/10/2022   ALBUMIN 2.0 (L) 08/10/2022   AST 77 (H) 08/10/2022   ALT 35 08/10/2022   ALKPHOS 83 08/10/2022   BILITOT 1.3 (H) 08/10/2022   GFRNONAA  >60 08/10/2022   GFRAA >60 03/07/2018    Lab Results  Component Value Date   WBC 1.5 (L) 08/10/2022   NEUTROABS 0.8 (L) 08/10/2022   HGB 10.0 (L) 08/10/2022   HCT 29.1 (L) 08/10/2022   MCV 101.7 (H) 08/10/2022   PLT 110 (L) 08/10/2022     STUDIES: No results found.  ASSESSMENT: Progressive stage IV bladder cancer.  PLAN:    Progressive stage IV bladder cancer: PET scan results from May 28, 2022 reviewed independently and reported as above with widespread metastatic disease.  Previously, patient was determined not to be a surgical candidate and proceeded with concurrent chemotherapy and XRT.  She received her fourth and last dose of cisplatin on August 04, 2021.  She subsequently completed XRT on August 19, 2021.  Patient had CT scan on Dec 20, 2021 that revealed mild abdominal lymphadenopathy.  Cystoscopy on January 07, 2022 did not reveal any evidence of recurrence.  Patient does not have a port.  Previously, after lengthy discussion which included hospice and comfort care, patient wishes to pursue systemic treatment.  Proceed with cycle 4 of single agent Keytruda today.  Return to clinic in 3 weeks for further evaluation and consideration of cycle 5.  Will repeat PET scan 2 to 3 days prior to next treatment.   Hypomagnesia: Chronic and unchanged.  Patient previously refused IV supplementation.  Continue oral magnesium supplementation.   Neutropenia: ANC has slightly decreased to 0.8.  Proceed with treatment as above. Anemia: Hemoglobin trending down and is now 10.0.   Thrombocytopenia: Platelets also trending down.  Currently 110. A-fib: Patient states that she had a referral to cardiology, but did not keep this appointment.   Hypokalemia: Patient received 40 mEq IV potassium today.  Continue oral potassium supplementation. Transaminitis: Improving.  Patient expressed understanding and was in agreement with this plan. She also understands that She can call clinic at any time with  any questions, concerns, or complaints.    Cancer Staging  Bladder cancer [facility]) Staging form: Urinary Bladder, AJCC 8th Edition - Clinical stage from 06/04/2021: Stage II (cT2, cN0, cM0) - Signed by Lloyd Huger, MD on 06/04/2021 WHO/ISUP grade (low/high): High Grade Histologic  grading system: 2 grade system  Lloyd Huger, MD   08/10/2022 11:37 AM

## 2022-08-11 ENCOUNTER — Inpatient Hospital Stay: Payer: 59

## 2022-08-11 VITALS — BP 143/85 | HR 109 | Temp 98.5°F | Resp 18

## 2022-08-11 DIAGNOSIS — Z5112 Encounter for antineoplastic immunotherapy: Secondary | ICD-10-CM | POA: Diagnosis not present

## 2022-08-11 DIAGNOSIS — E876 Hypokalemia: Secondary | ICD-10-CM

## 2022-08-11 MED ORDER — POTASSIUM CHLORIDE 20 MEQ/100ML IV SOLN: 20.0000 meq | Freq: Once | INTRAVENOUS | Status: DC

## 2022-08-11 MED ORDER — SODIUM CHLORIDE 0.9 % IV SOLN
20.0000 meq | Freq: Once | INTRAVENOUS | Status: AC
  Administered 2022-08-11: 20 meq via INTRAVENOUS
  Filled 2022-08-11: qty 10

## 2022-08-18 ENCOUNTER — Encounter: Payer: Self-pay | Admitting: Oncology

## 2022-08-18 ENCOUNTER — Encounter: Payer: Self-pay | Admitting: Radiation Oncology

## 2022-08-18 ENCOUNTER — Ambulatory Visit
Admission: RE | Admit: 2022-08-18 | Discharge: 2022-08-18 | Disposition: A | Discharge status: Home / Self Care | Payer: 59 | Source: Ambulatory Visit | Attending: Radiation Oncology | Admitting: Radiation Oncology

## 2022-08-18 VITALS — BP 148/96 | HR 86 | Temp 98.1°F | Resp 16

## 2022-08-18 DIAGNOSIS — C7951 Secondary malignant neoplasm of bone: Secondary | ICD-10-CM | POA: Diagnosis not present

## 2022-08-18 DIAGNOSIS — C678 Malignant neoplasm of overlapping sites of bladder: Secondary | ICD-10-CM

## 2022-08-18 DIAGNOSIS — C779 Secondary and unspecified malignant neoplasm of lymph node, unspecified: Secondary | ICD-10-CM | POA: Insufficient documentation

## 2022-08-18 DIAGNOSIS — C679 Malignant neoplasm of bladder, unspecified: Secondary | ICD-10-CM | POA: Diagnosis not present

## 2022-08-18 DIAGNOSIS — C787 Secondary malignant neoplasm of liver and intrahepatic bile duct: Secondary | ICD-10-CM | POA: Insufficient documentation

## 2022-08-18 DIAGNOSIS — Z923 Personal history of irradiation: Secondary | ICD-10-CM | POA: Insufficient documentation

## 2022-08-18 NOTE — Progress Notes (Signed)
Radiation Oncology Follow up Note  Name: Kristina Ellis   Date:   08/18/2022 MRN:  [id number] DOB: 07/13/1951    This 72 y.o. 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  She is gone on to have widespread metastatic disease.  She is currently on single agent Keytruda.  She for a while was in hospice care although has decided on immunotherapy.  She says she had an infusion this week she has been having some blood per rectum.  We have alerted Dr. Gary Fleet team to that.  She is really frail although she is complains of no specific points of bone pain or tenderness.  Most recent PET scan back in October showed widespread hypermetabolic metastatic disease involving low internal jugular mediastinal abdominal lymph nodes liver and bone.  COMPLICATIONS OF TREATMENT: none  FOLLOW UP COMPLIANCE: keeps appointments   PHYSICAL EXAM:  BP (!) 148/96 (BP Location: Right Arm, Patient Position: Sitting, Cuff Size: Small)   Pulse 86   Temp 98.1 F (36.7 C) (Tympanic)   Resp 16  Frail-appearing wheelchair-bound female in NAD.  Well-developed well-nourished patient in NAD. HEENT reveals PERLA, EOMI, discs not visualized.  Oral cavity is clear. No oral mucosal lesions are identified. Neck is clear without evidence of cervical or supraclavicular adenopathy. Lungs are clear to A&P. Cardiac examination is essentially unremarkable with regular rate and rhythm without murmur rub or thrill. Abdomen is benign with no organomegaly or masses noted. Motor sensory and DTR levels are equal and symmetric in the upper and lower extremities. Cranial nerves II through XII are grossly intact.  Proprioception is intact. No peripheral adenopathy or edema is identified. No motor or sensory levels are noted. Crude visual fields are within normal range.  RADIOLOGY RESULTS: PET scan reviewed compatible with above-stated findings  PLAN: Present time I am going to turn follow-up care over to medical oncology.  Be happy to reevaluate the patient at any time should the be an area to palliate.  She is really not complaining of any bone pain in the areas of hypermetabolic activity in her bone.  Patient comprehends my recommendations.  I would like to take this opportunity to thank you for allowing me to participate in the care of your patient.Noreene Filbert, MD

## 2022-08-19 ENCOUNTER — Encounter: Payer: Self-pay | Admitting: Oncology

## 2022-08-25 ENCOUNTER — Encounter: Payer: Self-pay | Admitting: Urology

## 2022-08-25 ENCOUNTER — Inpatient Hospital Stay (HOSPITAL_BASED_OUTPATIENT_CLINIC_OR_DEPARTMENT_OTHER): Payer: 59 | Admitting: Hospice and Palliative Medicine

## 2022-08-25 DIAGNOSIS — C678 Malignant neoplasm of overlapping sites of bladder: Secondary | ICD-10-CM

## 2022-08-25 NOTE — Progress Notes (Signed)
Unable to reach patient.  Will reschedule 

## 2022-08-26 ENCOUNTER — Ambulatory Visit
Admission: RE | Admit: 2022-08-26 | Discharge: 2022-08-26 | Disposition: A | Discharge status: Home / Self Care | Payer: 59 | Source: Ambulatory Visit | Attending: Oncology | Admitting: Oncology

## 2022-08-26 DIAGNOSIS — K760 Fatty (change of) liver, not elsewhere classified: Secondary | ICD-10-CM | POA: Insufficient documentation

## 2022-08-26 DIAGNOSIS — N1330 Unspecified hydronephrosis: Secondary | ICD-10-CM | POA: Insufficient documentation

## 2022-08-26 DIAGNOSIS — K7460 Unspecified cirrhosis of liver: Secondary | ICD-10-CM | POA: Insufficient documentation

## 2022-08-26 DIAGNOSIS — M19012 Primary osteoarthritis, left shoulder: Secondary | ICD-10-CM | POA: Insufficient documentation

## 2022-08-26 DIAGNOSIS — J90 Pleural effusion, not elsewhere classified: Secondary | ICD-10-CM | POA: Diagnosis not present

## 2022-08-26 DIAGNOSIS — I517 Cardiomegaly: Secondary | ICD-10-CM | POA: Insufficient documentation

## 2022-08-26 DIAGNOSIS — N852 Hypertrophy of uterus: Secondary | ICD-10-CM | POA: Insufficient documentation

## 2022-08-26 DIAGNOSIS — K8020 Calculus of gallbladder without cholecystitis without obstruction: Secondary | ICD-10-CM | POA: Diagnosis not present

## 2022-08-26 DIAGNOSIS — I700 Atherosclerosis of aorta: Secondary | ICD-10-CM | POA: Insufficient documentation

## 2022-08-26 DIAGNOSIS — C7951 Secondary malignant neoplasm of bone: Secondary | ICD-10-CM | POA: Diagnosis not present

## 2022-08-26 DIAGNOSIS — I509 Heart failure, unspecified: Secondary | ICD-10-CM | POA: Insufficient documentation

## 2022-08-26 DIAGNOSIS — C678 Malignant neoplasm of overlapping sites of bladder: Secondary | ICD-10-CM | POA: Diagnosis present

## 2022-08-26 DIAGNOSIS — C787 Secondary malignant neoplasm of liver and intrahepatic bile duct: Secondary | ICD-10-CM | POA: Diagnosis not present

## 2022-08-26 DIAGNOSIS — I2510 Atherosclerotic heart disease of native coronary artery without angina pectoris: Secondary | ICD-10-CM | POA: Diagnosis not present

## 2022-08-26 DIAGNOSIS — I6523 Occlusion and stenosis of bilateral carotid arteries: Secondary | ICD-10-CM | POA: Diagnosis not present

## 2022-08-26 LAB — GLUCOSE, CAPILLARY: Glucose-Capillary: 63 mg/dL — ABNORMAL LOW (ref 70–99)

## 2022-08-26 MED ORDER — FLUDEOXYGLUCOSE F - 18 (FDG) INJECTION
6.6200 | Freq: Once | INTRAVENOUS | Status: AC | PRN
  Administered 2022-08-26: 6.62 via INTRAVENOUS

## 2022-08-31 ENCOUNTER — Inpatient Hospital Stay: Payer: 59

## 2022-08-31 ENCOUNTER — Inpatient Hospital Stay (HOSPITAL_BASED_OUTPATIENT_CLINIC_OR_DEPARTMENT_OTHER): Payer: 59 | Admitting: Oncology

## 2022-08-31 ENCOUNTER — Encounter: Payer: Self-pay | Admitting: Oncology

## 2022-08-31 DIAGNOSIS — E876 Hypokalemia: Secondary | ICD-10-CM

## 2022-08-31 DIAGNOSIS — Z5112 Encounter for antineoplastic immunotherapy: Secondary | ICD-10-CM | POA: Diagnosis not present

## 2022-08-31 DIAGNOSIS — C678 Malignant neoplasm of overlapping sites of bladder: Secondary | ICD-10-CM

## 2022-08-31 LAB — MAGNESIUM: Magnesium: 1.4 mg/dL — ABNORMAL LOW (ref 1.7–2.4)

## 2022-08-31 LAB — CBC WITH DIFFERENTIAL/PLATELET
Abs Immature Granulocytes: 0.01 10*3/uL (ref 0.00–0.07)
Basophils Absolute: 0 10*3/uL (ref 0.0–0.1)
Basophils Relative: 1 %
Eosinophils Absolute: 0 10*3/uL (ref 0.0–0.5)
Eosinophils Relative: 2 %
HCT: 30.8 % — ABNORMAL LOW (ref 36.0–46.0)
Hemoglobin: 10.6 g/dL — ABNORMAL LOW (ref 12.0–15.0)
Immature Granulocytes: 1 %
Lymphocytes Relative: 17 %
Lymphs Abs: 0.4 10*3/uL — ABNORMAL LOW (ref 0.7–4.0)
MCH: 35.5 pg — ABNORMAL HIGH (ref 26.0–34.0)
MCHC: 34.4 g/dL (ref 30.0–36.0)
MCV: 103 fL — ABNORMAL HIGH (ref 80.0–100.0)
Monocytes Absolute: 0.2 10*3/uL (ref 0.1–1.0)
Monocytes Relative: 7 %
Neutro Abs: 1.5 10*3/uL — ABNORMAL LOW (ref 1.7–7.7)
Neutrophils Relative %: 72 %
Platelets: 127 10*3/uL — ABNORMAL LOW (ref 150–400)
RBC: 2.99 MIL/uL — ABNORMAL LOW (ref 3.87–5.11)
RDW: 15.1 % (ref 11.5–15.5)
WBC: 2 10*3/uL — ABNORMAL LOW (ref 4.0–10.5)
nRBC: 0 % (ref 0.0–0.2)

## 2022-08-31 LAB — COMPREHENSIVE METABOLIC PANEL
ALT: 27 U/L (ref 0–44)
AST: 63 U/L — ABNORMAL HIGH (ref 15–41)
Albumin: 2.2 g/dL — ABNORMAL LOW (ref 3.5–5.0)
Alkaline Phosphatase: 86 U/L (ref 38–126)
Anion gap: 10 (ref 5–15)
BUN: 11 mg/dL (ref 8–23)
CO2: 21 mmol/L — ABNORMAL LOW (ref 22–32)
Calcium: 8 mg/dL — ABNORMAL LOW (ref 8.9–10.3)
Chloride: 107 mmol/L (ref 98–111)
Creatinine, Ser: 0.86 mg/dL (ref 0.44–1.00)
GFR, Estimated: >60 mL/min (ref >60)
Glucose, Bld: 95 mg/dL (ref 70–99)
Potassium: 2.9 mmol/L — ABNORMAL LOW (ref 3.5–5.1)
Sodium: 138 mmol/L (ref 135–145)
Total Bilirubin: 1 mg/dL (ref 0.3–1.2)
Total Protein: 7.3 g/dL (ref 6.5–8.1)

## 2022-08-31 MED ORDER — MAGNESIUM SULFATE 2 GM/50ML IV SOLN
2.0000 g | Freq: Once | INTRAVENOUS | Status: AC
  Administered 2022-08-31: 2 g via INTRAVENOUS
  Filled 2022-08-31: qty 50

## 2022-08-31 MED ORDER — SODIUM CHLORIDE 0.9 % IV SOLN
200.0000 mg | Freq: Once | INTRAVENOUS | Status: AC
  Administered 2022-08-31: 200 mg via INTRAVENOUS
  Filled 2022-08-31: qty 200

## 2022-08-31 MED ORDER — SODIUM CHLORIDE 0.9 % IV SOLN
20.0000 meq | Freq: Once | INTRAVENOUS | Status: AC
  Administered 2022-08-31: 20 meq via INTRAVENOUS
  Filled 2022-08-31: qty 10

## 2022-08-31 MED ORDER — SODIUM CHLORIDE 0.9 % IV SOLN
Freq: Once | INTRAVENOUS | Status: AC
  Filled 2022-08-31: qty 250

## 2022-08-31 NOTE — Patient Instructions (Signed)
[facility]  Discharge Instructions: Thank you for choosing [facility] to provide your oncology and hematology care.  If you have a lab appointment with the [facility], please go directly to the [facility] and check in at the registration area.  Wear comfortable clothing and clothing appropriate for easy access to any Portacath or PICC line.   We strive to give you quality time with your provider. You may need to reschedule your appointment if you arrive late (15 or more minutes).  Arriving late affects you and other patients whose appointments are after yours.  Also, if you miss three or more appointments without notifying the office, you may be dismissed from the clinic at the provider's discretion.      For prescription refill requests, have your pharmacy contact our office and allow 72 hours for refills to be completed.     To help prevent nausea and vomiting after your treatment, we encourage you to take your nausea medication as directed.  BELOW ARE SYMPTOMS THAT SHOULD BE REPORTED IMMEDIATELY: *FEVER GREATER THAN 100.4 F (38 C) OR HIGHER *CHILLS OR SWEATING *NAUSEA AND VOMITING THAT IS NOT CONTROLLED WITH YOUR NAUSEA MEDICATION *UNUSUAL SHORTNESS OF BREATH *UNUSUAL BRUISING OR BLEEDING *URINARY PROBLEMS (pain or burning when urinating, or frequent urination) *BOWEL PROBLEMS (unusual diarrhea, constipation, pain near the anus) TENDERNESS IN MOUTH AND THROAT WITH OR WITHOUT PRESENCE OF ULCERS (sore throat, sores in mouth, or a toothache) UNUSUAL RASH, SWELLING OR PAIN  UNUSUAL VAGINAL DISCHARGE OR ITCHING   Items with * indicate a potential emergency and should be followed up as soon as possible or go to the Emergency Department if any problems should occur.  Please show the CHEMOTHERAPY ALERT CARD or IMMUNOTHERAPY ALERT CARD at check-in to the Emergency Department and triage nurse.  Should you have questions after your visit  or need to cancel or reschedule your appointment, please contact [facility]  [phone] and follow the prompts.  Office hours are 8:00 a.m. to 4:30 p.m. Monday - Friday. Please note that voicemails left after 4:00 p.m. may not be returned until the following business day.  We are closed weekends and major holidays. You have access to a nurse at all times for urgent questions. Please call the main number to the clinic [phone] and follow the prompts.  For any non-urgent questions, you may also contact your provider using MyChart. We now offer e-Visits for anyone 59 and older to request care online for non-urgent symptoms. For details visit [email or web address].   Also download the MyChart app! Go to the app store, search "MyChart", open the app, select [facility], and log in with your MyChart username and password.

## 2022-08-31 NOTE — Progress Notes (Signed)
[place]  [phone] Fax:(336) [phone]  ID: Kristina Ellis OB: 08/17/1950  MR#: [id number]  W[facility]#:[id number]  Patient Care Team: [facility] [place] as PCP - General (General Practice) Lloyd Huger, MD as Consulting Physician (Hematology and Oncology)   CHIEF COMPLAINT: Progressive stage IV bladder cancer.  INTERVAL HISTORY: Patient returns to clinic today for further evaluation, discussion of her PET scan results, and consideration of cycle 5 of single agent Keytruda.  She currently feels well and is asymptomatic.  She is tolerating her treatments without significant side effects.  She no longer complains of diarrhea, in fact has mild constipation.  She does not complain of any abdominal pain or discomfort today.  She has no neurologic complaints.  She denies any recent fevers or illnesses.  She has a fair appetite and denies weight loss.  She has no chest pain, cough, shortness of breath, or hemoptysis.  She denies any nausea or vomiting.  She has no urinary complaints.  Patient offers no further specific complaints today.  REVIEW OF SYSTEMS:   Review of Systems  Constitutional: Negative.  Negative for fever, malaise/fatigue and weight loss.  Respiratory: Negative.  Negative for cough, hemoptysis and shortness of breath.   Cardiovascular: Negative.  Negative for chest pain and leg swelling.  Gastrointestinal:  Positive for constipation. Negative for abdominal pain and diarrhea.  Genitourinary:  Positive for urgency. Negative for hematuria.  Musculoskeletal:  Negative for back pain, falls and joint pain.  Skin: Negative.  Negative for rash.  Neurological: Negative.  Negative for dizziness, seizures, weakness and headaches.  Psychiatric/Behavioral: Negative.  The patient is not nervous/anxious.     As per HPI. Otherwise, a complete review of systems is negative.  PAST MEDICAL HISTORY: Past Medical History:  Diagnosis Date    Aortic atherosclerosis ([place])    Arthritis    Atrial fibrillation ([place])    a.) s/p TEE with cardioversion on 02/04/2020. b.) on daily apixaban.   Bladder mass    a.) CT 04/08/2021 --> 3.2 cm intraluminal bladder mass.   Carotid stenosis, bilateral    a.) Doppler 07/04/2020 --> mild; 1-49% stenosis BILATERALLY.   Cholelithiasis    a.) CT 11/06/2020 --> largest measured 4.5 cm   Chronic anticoagulation    a.) Apixaban   Common biliary duct calculus    a.) CT 04/08/2021 --> CBD dilated at 8 mm; 74m calculus within distal CBD.   Coronary artery disease involving native coronary artery of native heart without angina pectoris 53/53/6144  Diastolic dysfunction    a.) TTE 07/04/2020 --> LVEF 60-65%; G1DD.   Hepatic cirrhosis (HCC)    Hepatic steatosis    Hepatitis 1970   History of 2019 novel coronavirus disease (COVID-19) 12/20/2019   History of marijuana use    Hypertension    Hypertensive retinopathy    IDA (iron deficiency anemia)    Mitral stenosis    a.) TEE 04/10/2020 --> mild. b.) TTE 07/04/2020 --> EF 60-65%; mild (mean gradient 5 mmHg).   Moderate pulmonary arterial systolic hypertension ([facility] 12/22/2019   Nephrolithiasis    NSTEMI (non-ST elevated myocardial infarction) ([facility] 07/26/2016   Open-angle glaucoma 09/11/2010   PAH (pulmonary artery hypertension) ([facility]    a.) TTE 12/21/2019 --> PASP 44 mmHg.   Uterine fibroid    a.) CT 04/08/2021 --> multiple with largest measuring 7 cm.   Valvular regurgitation    a.) TTE 09/01/2010 --> trivial to mild pan-valvular. b.) TTE 12/21/2019 --> mild MR and  AR; moderate TR. c.) TTE 07/04/2020 --> mild TR; trivial MR and PR.   Vestibular neuronitis     PAST SURGICAL HISTORY: Past Surgical History:  Procedure Laterality Date   BREAST CYST ASPIRATION Left    COLONOSCOPY  2012   ERCP N/A 12/18/2021   Procedure: ENDOSCOPIC RETROGRADE CHOLANGIOPANCREATOGRAPHY (ERCP);  Surgeon: Lucilla Lame, MD;  Location: [facility] ENDOSCOPY;  Service: Endoscopy;   Laterality: N/A;   TEE WITH CARDIOVERSION N/A 02/04/2020   Procedure: TEE WITH CARDIOVERSION; Location: [facility]; Surgeon: Kandis Cocking, MD   TRANSURETHRAL RESECTION OF BLADDER TUMOR N/A 05/15/2021   Procedure: TRANSURETHRAL RESECTION OF BLADDER TUMOR (TURBT);  Surgeon: Billey Co, MD;  Location: [facility] ORS;  Service: Urology;  Laterality: N/A;    FAMILY HISTORY: Family History  Problem Relation Age of Onset   Aneurysm Mother    Colon cancer Father    Lung cancer Brother    Breast cancer Neg Hx     ADVANCED DIRECTIVES (Y/N):  N  HEALTH MAINTENANCE: Social History   Tobacco Use   Smoking status: Former    Packs/day: 0.10    Years: 10.00    Total pack years: 1.00    Types: Cigarettes    Passive exposure: Past   Smokeless tobacco: Never   Tobacco comments:    occasional smoker  Vaping Use   Vaping Use: Never used  Substance Use Topics   Alcohol use: Yes    Alcohol/week: 5.0 standard drinks of alcohol    Types: 5 Cans of beer per week    Comment: weekly   Drug use: Not Currently    Types: 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     ELIQUIS 5 MG TABS tablet Take 1 tablet (5 mg total) by mouth 2 (two) times daily. 60 tablet 1   magnesium oxide (MAG-OX) 400 MG tablet Take 1 tablet by mouth 2 (two) times daily.     metoprolol tartrate (LOPRESSOR) 100 MG tablet Take 1 tablet by mouth 2 (two) times daily.     potassium chloride SA (KLOR-CON M) 20 MEQ tablet Take 1 tablet (20 mEq total) by mouth 2 (two) times daily. 90 tablet 1   trolamine salicylate (ASPERCREME) 10 % cream Apply 1 application topically as needed for muscle pain.     atorvastatin (LIPITOR) 40 MG tablet Take 40 mg by mouth  at bedtime. (Patient not taking: Reported on 08/31/2022)     diltiazem (CARDIZEM CD) 120 MG 24 hr capsule Take 1 capsule (120 mg total) by mouth daily. (Patient not taking: Reported on 08/31/2022) 30 capsule 0   metoprolol succinate (TOPROL-XL) 25 MG 24 hr tablet Take 1 tablet (25 mg total) by mouth 2 (two) times daily. Take with or immediately following a meal. 60 tablet 0   Multiple Vitamin (MULTIVITAMIN WITH MINERALS) TABS tablet Take 1 tablet by mouth daily. (Patient not taking: Reported on 08/31/2022) 90 tablet 0   oxybutynin (DITROPAN-XL) 10 MG 24 hr tablet Take 1 tablet (10 mg total) by mouth daily. (Patient not taking: Reported on 08/31/2022) 30 tablet 11   No current facility-administered medications for this visit.   Facility-Administered Medications Ordered in Other Visits  Medication  Dose Route Frequency Provider Last Rate Last Admin   heparin lock flush 100 unit/mL  500 Units Intracatheter Once PRN Lloyd Huger, MD       sodium chloride flush (NS) 0.9 % injection 10 mL  10 mL Intracatheter PRN Lloyd Huger, MD        OBJECTIVE: Vitals:   08/31/22 1104  BP: (!) 158/86  Pulse: 86  Resp: 16  Temp: (!) 96.8 F (36 C)  SpO2: 100%     Body mass index is 24.01 kg/m.    ECOG FS:1 - Symptomatic but completely ambulatory  General: Thin, no acute distress, sitting in a wheelchair. Eyes: Pink conjunctiva, anicteric sclera. HEENT: Normocephalic, moist mucous membranes. Lungs: No audible wheezing or coughing. Heart: Regular rate and rhythm. Abdomen: Soft, nontender, no obvious distention. Musculoskeletal: No edema, cyanosis, or clubbing. Neuro: Alert, answering all questions appropriately. Cranial nerves grossly intact. Skin: No rashes or petechiae noted. Psych: Normal affect.  LAB RESULTS:  Lab Results  Component Value Date   NA 138 08/31/2022   K 2.9 (L) 08/31/2022   CL 107 08/31/2022   CO2 21 (L) 08/31/2022   GLUCOSE 95 08/31/2022   BUN 11 08/31/2022    CREATININE 0.86 08/31/2022   CALCIUM 8.0 (L) 08/31/2022   PROT 7.3 08/31/2022   ALBUMIN 2.2 (L) 08/31/2022   AST 63 (H) 08/31/2022   ALT 27 08/31/2022   ALKPHOS 86 08/31/2022   BILITOT 1.0 08/31/2022   GFRNONAA >60 08/31/2022   GFRAA >60 03/07/2018    Lab Results  Component Value Date   WBC 2.0 (L) 08/31/2022   NEUTROABS 1.5 (L) 08/31/2022   HGB 10.6 (L) 08/31/2022   HCT 30.8 (L) 08/31/2022   MCV 103.0 (H) 08/31/2022   PLT 127 (L) 08/31/2022     STUDIES: NM PET Image Restag (PS) Skull Base To Thigh  Result Date: 08/27/2022 CLINICAL DATA:  Subsequent treatment strategy for bladder cancer, staging. Chemotherapy 2 weeks ago. EXAM: NUCLEAR MEDICINE PET SKULL BASE TO THIGH TECHNIQUE: 6.6 mCi F-18 FDG was injected intravenously. Full-ring PET imaging was performed from the skull base to thigh after the radiotracer. CT data was obtained and used for attenuation correction and anatomic localization. Fasting blood glucose: 120 mg/dl COMPARISON:  05/26/2022 FINDINGS: Mediastinal blood pool activity: SUV max 1.6 Liver activity: SUV max NA NECK: Resolution of left low jugular hypermetabolic adenopathy. Index 7 mm node on 48/2 measured 1.5 cm on the prior. Incidental CT findings: Dense bilateral carotid atherosclerosis. Cerebral atrophy. Mucosal thickening of bilateral maxillary sinuses. CHEST: The previously described hypermetabolic mediastinal adenopathy has resolved. No pulmonary parenchymal hypermetabolism identified. Incidental CT findings: Mild cardiomegaly. Aortic and coronary artery calcification. Tiny, right larger than left pleural effusions are new. Mild septal thickening including at the right lung base is consistent with interstitial edema. ABDOMEN/PELVIS: Resolved hypermetabolic liver lesions. Resolved hypermetabolic abdominal adenopathy. An index non FDG avid left periaortic node measures 11 mm on 141/2 versus 2.2 cm on the prior exam (when remeasured). Incidental CT findings: Cirrhosis  and hepatic steatosis. Gallstones up to 2.6 cm. Normal adrenal glands. Bilateral punctate renal collecting system calculi. Development of moderate left-sided hydroureteronephrosis. Suboptimal evaluation of the bladder, which appears mildly thick walled but is underdistended. The ureters are difficult to follow and there are extensive vascular calcifications. Enlarged, globular uterus.  Trace pelvic fluid is similar. SKELETON: Marked response to therapy of multifocal osseous metastasis. Low-level residual proximal left femoral activity at a S.U.V. max of 1.8 versus a S.U.V. max of  5.4 on the prior exam. Anterior right fifth rib expansile lesion measures a S.U.V. max of 1.2 today versus a S.U.V. max of 10.1 on the prior exam. Increased sclerosis with possible pathologic fracture healing included on 93/2. Incidental CT findings: Right greater than left shoulder osteoarthritis. IMPRESSION: 1. Complete metabolic response to therapy of hepatic and nodal metastasis. 2. Near complete metabolic response to therapy of osseous metastasis. Low-level activity within proximal left femoral and anterior right fifth rib lesions persist. 3. Development of left-sided hydroureteronephrosis, incompletely characterized due to nondedicated CT technique. Consider pre and post-contrast abdominopelvic CT using a hematuria protocol. 4. Mild congestive heart failure with tiny bilateral pleural effusions, new. 5. Incidental findings, including: Cirrhosis. Cholelithiasis. Aortic atherosclerosis (ICD10-I70.0), coronary artery atherosclerosis . Uterine enlargement is most likely related to fibroids. Trace free pelvic fluid. Sinus disease. Electronically Signed   By: Abigail Miyamoto M.D.   On: 08/27/2022 10:32    ONCOLOGY HISTORY: Initially, patient was determined not to be a surgical candidate and proceeded with concurrent chemotherapy and XRT.  She received her fourth and last dose of cisplatin on August 04, 2021.  She subsequently completed XRT  on August 19, 2021.  Patient had CT scan on Dec 20, 2021 that revealed mild abdominal lymphadenopathy.  Cystoscopy on January 07, 2022 did not reveal any evidence of recurrence.  PET scan results from May 28, 2022 with widespread metastatic disease.  Patient initiated single agent Keytruda on June 08, 2022.  ASSESSMENT: Progressive stage IV bladder cancer.  PLAN:    Progressive stage IV bladder cancer: See oncology history as above.  Repeat PET scan on August 27, 2022 reviewed independently and reported as above with essentially resolution of patient's metastatic disease.  Proceed with cycle 5 of Keytruda today.  Plan to continue treatment for at least 1 to 2 years.  Return to clinic in 3 weeks for further evaluation and consideration of cycle 6.    Hypomagnesia: Chronic and unchanged.  Patient's magnesium is 1.4 today.  Patient previously refused IV supplementation.  Continue oral magnesium supplementation.   Neutropenia: Mildly improved.  Patient's ANC is 1.5 today.  Proceed with treatment as above. Anemia: Hemoglobin rightly improved to 10.6, monitor. Thrombocytopenia: Platelets improved to 127, monitor. A-fib: Patient states that she had a referral to cardiology, but did not keep this appointment.   Hypokalemia: Patient received 20 mEq IV potassium today.  Continue oral potassium supplementation. Transaminitis: Nearly resolved.  Patient expressed understanding and was in agreement with this plan. She also understands that She can call clinic at any time with any questions, concerns, or complaints.    Cancer Staging  Bladder cancer [facility]) Staging form: Urinary Bladder, AJCC 8th Edition - Clinical stage from 06/04/2021: Stage II (cT2, cN0, cM0) - Signed by Lloyd Huger, MD on 06/04/2021 WHO/ISUP grade (low/high): High Grade Histologic grading system: 2 grade system  Lloyd Huger, MD   08/31/2022 11:44 AM

## 2022-08-31 NOTE — Progress Notes (Signed)
Nutrition Follow-up:  Patient with stage IV bladder cancer.  Patient on pembrolizumab.    Met with patient in infusion.  Reports that appetite is about the same.  Bought some fruit cups and soups that she has been eating.  Yesterday ate sausage, egg biscuit and fish sandwich.  Usually eats breakfast around 9-10 am and eats again around 3-4 pm.  Does not eat past 7 pm.  No bowel movement this am which she usually has one every am.  Was told by MD that she can take miralax.      Medications: KCL, Mag Ox  Labs: K 2.9 (IV today), Mag 1.4 (IV today)  Anthropometrics:   Weight 118 lb 14.4 oz today  121 lb on 1/2 138 lb on 08/09/21   NUTRITION DIAGNOSIS: Inadequate oral intake ongoing    INTERVENTION:  Recommend patient add snack/meal between am and pm meal.  Discussed options with patient Discussed foods to help with constipation Provided lactose free milk products available.  Encouraged patient to try [facility] [employer] shakes.      MONITORING, EVALUATION, GOAL: weight trends, intake   NEXT VISIT: Tuesday, Feb 13 during infusion  Ricardo Kayes B. Zenia Resides, [place], [place] Registered Dietitian [phone]

## 2022-09-21 ENCOUNTER — Inpatient Hospital Stay (HOSPITAL_BASED_OUTPATIENT_CLINIC_OR_DEPARTMENT_OTHER): Payer: 59 | Admitting: Oncology

## 2022-09-21 ENCOUNTER — Other Ambulatory Visit: Payer: Medicare Other

## 2022-09-21 ENCOUNTER — Encounter: Payer: Self-pay | Admitting: Oncology

## 2022-09-21 ENCOUNTER — Ambulatory Visit: Payer: Medicare Other

## 2022-09-21 ENCOUNTER — Inpatient Hospital Stay: Payer: 59

## 2022-09-21 ENCOUNTER — Inpatient Hospital Stay: Payer: 59 | Attending: Oncology

## 2022-09-21 ENCOUNTER — Inpatient Hospital Stay: Payer: Medicare Other | Admitting: Oncology

## 2022-09-21 VITALS — BP 122/82 | HR 86 | Temp 97.1°F | Resp 16 | Ht 59.0 in | Wt 120.0 lb

## 2022-09-21 DIAGNOSIS — D709 Neutropenia, unspecified: Secondary | ICD-10-CM | POA: Insufficient documentation

## 2022-09-21 DIAGNOSIS — D696 Thrombocytopenia, unspecified: Secondary | ICD-10-CM | POA: Insufficient documentation

## 2022-09-21 DIAGNOSIS — C678 Malignant neoplasm of overlapping sites of bladder: Secondary | ICD-10-CM

## 2022-09-21 DIAGNOSIS — D649 Anemia, unspecified: Secondary | ICD-10-CM | POA: Insufficient documentation

## 2022-09-21 DIAGNOSIS — C679 Malignant neoplasm of bladder, unspecified: Secondary | ICD-10-CM | POA: Insufficient documentation

## 2022-09-21 DIAGNOSIS — Z79899 Other long term (current) drug therapy: Secondary | ICD-10-CM | POA: Diagnosis not present

## 2022-09-21 DIAGNOSIS — I4891 Unspecified atrial fibrillation: Secondary | ICD-10-CM | POA: Insufficient documentation

## 2022-09-21 DIAGNOSIS — I509 Heart failure, unspecified: Secondary | ICD-10-CM | POA: Insufficient documentation

## 2022-09-21 DIAGNOSIS — C7951 Secondary malignant neoplasm of bone: Secondary | ICD-10-CM | POA: Insufficient documentation

## 2022-09-21 DIAGNOSIS — Z5112 Encounter for antineoplastic immunotherapy: Secondary | ICD-10-CM | POA: Diagnosis present

## 2022-09-21 DIAGNOSIS — I110 Hypertensive heart disease with heart failure: Secondary | ICD-10-CM | POA: Diagnosis not present

## 2022-09-21 LAB — CBC WITH DIFFERENTIAL/PLATELET
Abs Immature Granulocytes: 0 10*3/uL (ref 0.00–0.07)
Basophils Absolute: 0 10*3/uL (ref 0.0–0.1)
Basophils Relative: 1 %
Eosinophils Absolute: 0.1 10*3/uL (ref 0.0–0.5)
Eosinophils Relative: 3 %
HCT: 35 % — ABNORMAL LOW (ref 36.0–46.0)
Hemoglobin: 11.5 g/dL — ABNORMAL LOW (ref 12.0–15.0)
Immature Granulocytes: 0 %
Lymphocytes Relative: 34 %
Lymphs Abs: 0.5 10*3/uL — ABNORMAL LOW (ref 0.7–4.0)
MCH: 34.6 pg — ABNORMAL HIGH (ref 26.0–34.0)
MCHC: 32.9 g/dL (ref 30.0–36.0)
MCV: 105.4 fL — ABNORMAL HIGH (ref 80.0–100.0)
Monocytes Absolute: 0.1 10*3/uL (ref 0.1–1.0)
Monocytes Relative: 8 %
Neutro Abs: 0.9 10*3/uL — ABNORMAL LOW (ref 1.7–7.7)
Neutrophils Relative %: 54 %
Platelets: 144 10*3/uL — ABNORMAL LOW (ref 150–400)
RBC: 3.32 MIL/uL — ABNORMAL LOW (ref 3.87–5.11)
RDW: 14.4 % (ref 11.5–15.5)
WBC: 1.6 10*3/uL — ABNORMAL LOW (ref 4.0–10.5)
nRBC: 0 % (ref 0.0–0.2)

## 2022-09-21 LAB — COMPREHENSIVE METABOLIC PANEL
ALT: 28 U/L (ref 0–44)
AST: 89 U/L — ABNORMAL HIGH (ref 15–41)
Albumin: 2.5 g/dL — ABNORMAL LOW (ref 3.5–5.0)
Alkaline Phosphatase: 77 U/L (ref 38–126)
Anion gap: 9 (ref 5–15)
BUN: 13 mg/dL (ref 8–23)
CO2: 21 mmol/L — ABNORMAL LOW (ref 22–32)
Calcium: 8.2 mg/dL — ABNORMAL LOW (ref 8.9–10.3)
Chloride: 111 mmol/L (ref 98–111)
Creatinine, Ser: 0.83 mg/dL (ref 0.44–1.00)
GFR, Estimated: >60 mL/min (ref >60)
Glucose, Bld: 89 mg/dL (ref 70–99)
Potassium: 3.6 mmol/L (ref 3.5–5.1)
Sodium: 141 mmol/L (ref 135–145)
Total Bilirubin: 0.7 mg/dL (ref 0.3–1.2)
Total Protein: 8 g/dL (ref 6.5–8.1)

## 2022-09-21 LAB — MAGNESIUM: Magnesium: 1.7 mg/dL (ref 1.7–2.4)

## 2022-09-21 LAB — TSH: TSH: 5.259 u[IU]/mL — ABNORMAL HIGH (ref 0.350–4.500)

## 2022-09-21 MED ORDER — SODIUM CHLORIDE 0.9 % IV SOLN
200.0000 mg | Freq: Once | INTRAVENOUS | Status: AC
  Administered 2022-09-21: 200 mg via INTRAVENOUS
  Filled 2022-09-21: qty 8

## 2022-09-21 MED ORDER — SODIUM CHLORIDE 0.9 % IV SOLN
Freq: Once | INTRAVENOUS | Status: AC
  Filled 2022-09-21: qty 250

## 2022-09-21 NOTE — Progress Notes (Signed)
Nutrition Follow-up:  Patient with stage IV bladder cancer.  Patient on pembrolizumab.    Met with patient during infusion.  Reports that she has been trying to eat a little bit more food.  "I have been trying to eat a whole sandwich instead of just half."  Yesterday ate hot dog for lunch and 2 pieces of fish with 2 slices of bread for dinner.  Breakfast was cup of peaches.  Has not tried the lactose free milk products.  Ensure/boost cause diarrhea.     Medications: reviewed  Labs: reviewed  Anthropometrics:   Weight 120 lb increased  118 lb 14.4 oz on 1/23 121 lb on 1/2 138 lb on 08/09/21    NUTRITION DIAGNOSIS: Inadequate oral intake ongoing    INTERVENTION:  Reviewed ways to add calories and protein in diet.     MONITORING, EVALUATION, GOAL: weight trends, intake   NEXT VISIT: Wednesday, March 27 phone call  Mykeal Carrick B. Zenia Resides, [place], [place] Registered Dietitian [phone]

## 2022-09-21 NOTE — Progress Notes (Signed)
[place]  [phone] [phone]) [phone]  ID: Kristina Ellis OB: [birth year]  MR#: [id number]  [id number]  Patient Care Team: [facility], [place] as PCP - General (General Practice) Lloyd Huger, MD as Consulting Physician (Hematology and Oncology)   CHIEF COMPLAINT: Progressive stage IV bladder cancer.  INTERVAL HISTORY: Patient returns to clinic today for further evaluation and consideration of cycle 6 of single agent Keytruda.  She continues to feel well and remains asymptomatic.  She is tolerating her treatments without significant side effects. She does not complain of any abdominal pain or discomfort today.  She has no neurologic complaints.  She denies any recent fevers or illnesses.  She has a fair appetite and denies weight loss.  She has no chest pain, cough, shortness of breath, or hemoptysis.  She denies any nausea, vomiting, constipation, or diarrhea.  She has no urinary complaints.  Patient offers no further specific complaints today.  REVIEW OF SYSTEMS:   Review of Systems  Constitutional: Negative.  Negative for fever, malaise/fatigue and weight loss.  Respiratory: Negative.  Negative for cough, hemoptysis and shortness of breath.   Cardiovascular: Negative.  Negative for chest pain and leg swelling.  Gastrointestinal: Negative.  Negative for abdominal pain, constipation and diarrhea.  Genitourinary: Negative.  Negative for hematuria and urgency.  Musculoskeletal:  Negative for back pain, falls and joint pain.  Skin: Negative.  Negative for rash.  Neurological: Negative.  Negative for dizziness, seizures, weakness and headaches.  Psychiatric/Behavioral: Negative.  The patient is not nervous/anxious.     As per HPI. Otherwise, a complete review of systems is negative.  PAST MEDICAL HISTORY: Past Medical History:  Diagnosis Date   Aortic atherosclerosis ([place])    Arthritis    Atrial fibrillation ([place])     a.) s/p TEE with cardioversion on 02/04/2020. b.) on daily apixaban.   Bladder mass    a.) CT 04/08/2021 --> 3.2 cm intraluminal bladder mass.   Carotid stenosis, bilateral    a.) Doppler 07/04/2020 --> mild; 1-49% stenosis BILATERALLY.   Cholelithiasis    a.) CT 11/06/2020 --> largest measured 4.5 cm   Chronic anticoagulation    a.) Apixaban   Common biliary duct calculus    a.) CT 04/08/2021 --> CBD dilated at 8 mm; 67m calculus within distal CBD.   Coronary artery disease involving native coronary artery of native heart without angina pectoris [id number]  Diastolic dysfunction    a.) TTE 07/04/2020 --> LVEF 60-65%; G1DD.   Hepatic cirrhosis (HCC)    Hepatic steatosis    Hepatitis 1970   History of 2019 novel coronavirus disease (COVID-19) 12/20/2019   History of marijuana use    Hypertension    Hypertensive retinopathy    IDA (iron deficiency anemia)    Mitral stenosis    a.) TEE 04/10/2020 --> mild. b.) TTE 07/04/2020 --> EF 60-65%; mild (mean gradient 5 mmHg).   Moderate pulmonary arterial systolic hypertension ([facility] 12/22/2019   Nephrolithiasis    NSTEMI (non-ST elevated myocardial infarction) ([facility] 07/26/2016   Open-angle glaucoma 09/11/2010   PAH (pulmonary artery hypertension) ([facility]    a.) TTE 12/21/2019 --> PASP 44 mmHg.   Uterine fibroid    a.) CT 04/08/2021 --> multiple with largest measuring 7 cm.   Valvular regurgitation    a.) TTE 09/01/2010 --> trivial to mild pan-valvular. b.) TTE 12/21/2019 --> mild MR and AR; moderate TR. c.) TTE 07/04/2020 --> mild TR; trivial MR and PR.   Vestibular neuronitis  PAST SURGICAL HISTORY: Past Surgical History:  Procedure Laterality Date   BREAST CYST ASPIRATION Left    COLONOSCOPY  2012   ERCP N/A 12/18/2021   Procedure: ENDOSCOPIC RETROGRADE CHOLANGIOPANCREATOGRAPHY (ERCP);  Surgeon: Lucilla Lame, MD;  Location: [facility];  Service: Endoscopy;  Laterality: N/A;   TEE WITH CARDIOVERSION N/A 02/04/2020   Procedure: TEE  WITH CARDIOVERSION; Location: [facility]; Surgeon: Kandis Cocking, MD   TRANSURETHRAL RESECTION OF BLADDER TUMOR N/A 05/15/2021   Procedure: TRANSURETHRAL RESECTION OF BLADDER TUMOR (TURBT);  Surgeon: Billey Co, MD;  Location: [facility] ORS;  Service: Urology;  Laterality: N/A;    FAMILY HISTORY: Family History  Problem Relation Age of Onset   Aneurysm Mother    Colon cancer Father    Lung cancer Brother    Breast cancer Neg Hx     ADVANCED DIRECTIVES (Y/N):  N  HEALTH MAINTENANCE: Social History   Tobacco Use   Smoking status: Former    Packs/day: 0.10    Years: 10.00    Total pack years: 1.00    Types: Cigarettes    Passive exposure: Past   Smokeless tobacco: Never   Tobacco comments:    occasional smoker  Vaping Use   Vaping Use: Never used  Substance Use Topics   Alcohol use: Yes    Alcohol/week: 5.0 standard drinks of alcohol    Types: 5 Cans of beer per week    Comment: weekly   Drug use: Not Currently    Types: 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     ELIQUIS 5 MG TABS tablet Take 1 tablet (5 mg total) by mouth 2 (two) times daily. 60 tablet 1   magnesium oxide (MAG-OX) 400 MG tablet Take 1 tablet by mouth 2 (two) times daily.     metoprolol succinate (TOPROL-XL) 25 MG 24 hr tablet Take 1 tablet (25 mg total) by mouth 2 (two) times daily. Take with or immediately following a meal. 60 tablet 0   metoprolol tartrate (LOPRESSOR) 100 MG tablet Take 1 tablet by mouth 2 (two) times daily.     potassium chloride SA (KLOR-CON M) 20 MEQ tablet Take 1 tablet (20 mEq total) by mouth 2 (two) times daily. 90 tablet 1   trolamine salicylate (ASPERCREME) 10 % cream Apply 1 application  topically as needed for muscle pain.     atorvastatin (LIPITOR) 40 MG tablet Take 40 mg by mouth at bedtime. (Patient not taking: Reported on 08/31/2022)     diltiazem (CARDIZEM CD) 120 MG 24 hr capsule Take 1 capsule (120 mg total) by mouth daily. (Patient not taking: Reported on 08/31/2022) 30 capsule 0   Multiple Vitamin (MULTIVITAMIN WITH MINERALS) TABS tablet Take 1 tablet by mouth daily. (Patient not taking: Reported on 08/31/2022) 90 tablet 0   oxybutynin (DITROPAN-XL) 10 MG 24 hr tablet Take 1 tablet (10 mg total) by mouth daily. (Patient not taking: Reported on 08/31/2022) 30 tablet 11   No current facility-administered medications for this visit.   Facility-Administered Medications Ordered in Other Visits  Medication Dose Route Frequency Provider Last Rate Last Admin   heparin lock flush 100 unit/mL  500 Units Intracatheter Once PRN  Lloyd Huger, MD       pembrolizumab [facility]) 200 mg in sodium chloride 0.9 % 50 mL chemo infusion  200 mg Intravenous Once Lloyd Huger, MD       sodium chloride flush (NS) 0.9 % injection 10 mL  10 mL Intracatheter PRN Lloyd Huger, MD        OBJECTIVE: Vitals:   09/21/22 0911  BP: 122/82  Pulse: 86  Resp: 16  Temp: (!) 97.1 F (36.2 C)  SpO2: 100%     Body mass index is 24.24 kg/m.    ECOG FS:1 - Symptomatic but completely ambulatory  General: Well-developed, well-nourished, no acute distress.  Sitting in a wheelchair. Eyes: Pink conjunctiva, anicteric sclera. HEENT: Normocephalic, moist mucous membranes. Lungs: No audible wheezing or coughing. Heart: Regular rate and rhythm. Abdomen: Soft, nontender, no obvious distention. Musculoskeletal: No edema, cyanosis, or clubbing. Neuro: Alert, answering all questions appropriately. Cranial nerves grossly intact. Skin: No rashes or petechiae noted. Psych: Normal affect.   LAB RESULTS:  Lab Results  Component Value Date   NA 141 09/21/2022   K 3.6 09/21/2022   CL 111  09/21/2022   CO2 21 (L) 09/21/2022   GLUCOSE 89 09/21/2022   BUN 13 09/21/2022   CREATININE 0.83 09/21/2022   CALCIUM 8.2 (L) 09/21/2022   PROT 8.0 09/21/2022   ALBUMIN 2.5 (L) 09/21/2022   AST 89 (H) 09/21/2022   ALT 28 09/21/2022   ALKPHOS 77 09/21/2022   BILITOT 0.7 09/21/2022   GFRNONAA >60 09/21/2022   GFRAA >60 03/07/2018    Lab Results  Component Value Date   WBC 1.6 (L) 09/21/2022   NEUTROABS 0.9 (L) 09/21/2022   HGB 11.5 (L) 09/21/2022   HCT 35.0 (L) 09/21/2022   MCV 105.4 (H) 09/21/2022   PLT 144 (L) 09/21/2022     STUDIES: NM PET Image Restag (PS) Skull Base To Thigh  Result Date: 08/27/2022 CLINICAL DATA:  Subsequent treatment strategy for bladder cancer, staging. Chemotherapy 2 weeks ago. EXAM: NUCLEAR MEDICINE PET SKULL BASE TO THIGH TECHNIQUE: 6.6 mCi F-18 FDG was injected intravenously. Full-ring PET imaging was performed from the skull base to thigh after the radiotracer. CT data was obtained and used for attenuation correction and anatomic localization. Fasting blood glucose: 120 mg/dl COMPARISON:  05/26/2022 FINDINGS: Mediastinal blood pool activity: SUV max 1.6 Liver activity: SUV max NA NECK: Resolution of left low jugular hypermetabolic adenopathy. Index 7 mm node on 48/2 measured 1.5 cm on the prior. Incidental CT findings: Dense bilateral carotid atherosclerosis. Cerebral atrophy. Mucosal thickening of bilateral maxillary sinuses. CHEST: The previously described hypermetabolic mediastinal adenopathy has resolved. No pulmonary parenchymal hypermetabolism identified. Incidental CT findings: Mild cardiomegaly. Aortic and coronary artery calcification. Tiny, right larger than left pleural effusions are new. Mild septal thickening including at the right lung base is consistent with interstitial edema. ABDOMEN/PELVIS: Resolved hypermetabolic liver lesions. Resolved hypermetabolic abdominal adenopathy. An index non FDG avid left periaortic node measures 11 mm on 141/2  versus 2.2 cm on the prior exam (when remeasured). Incidental CT findings: Cirrhosis and hepatic steatosis. Gallstones up to 2.6 cm. Normal adrenal glands. Bilateral punctate renal collecting system calculi. Development of moderate left-sided hydroureteronephrosis. Suboptimal evaluation of the bladder, which appears mildly thick walled but is underdistended. The ureters are difficult to follow and there are extensive vascular calcifications. Enlarged, globular uterus.  Trace pelvic fluid is similar. SKELETON: Marked response to therapy of multifocal osseous metastasis. Low-level residual proximal left femoral activity at a S.U.V.  max of 1.8 versus a S.U.V. max of 5.4 on the prior exam. Anterior right fifth rib expansile lesion measures a S.U.V. max of 1.2 today versus a S.U.V. max of 10.1 on the prior exam. Increased sclerosis with possible pathologic fracture healing included on 93/2. Incidental CT findings: Right greater than left shoulder osteoarthritis. IMPRESSION: 1. Complete metabolic response to therapy of hepatic and nodal metastasis. 2. Near complete metabolic response to therapy of osseous metastasis. Low-level activity within proximal left femoral and anterior right fifth rib lesions persist. 3. Development of left-sided hydroureteronephrosis, incompletely characterized due to nondedicated CT technique. Consider pre and post-contrast abdominopelvic CT using a hematuria protocol. 4. Mild congestive heart failure with tiny bilateral pleural effusions, new. 5. Incidental findings, including: Cirrhosis. Cholelithiasis. Aortic atherosclerosis (ICD10-I70.0), coronary artery atherosclerosis . Uterine enlargement is most likely related to fibroids. Trace free pelvic fluid. Sinus disease. Electronically Signed   By: Abigail Miyamoto M.D.   On: 08/27/2022 10:32    ONCOLOGY HISTORY: Initially, patient was determined not to be a surgical candidate and proceeded with concurrent chemotherapy and XRT.  She received her  fourth and last dose of cisplatin on August 04, 2021.  She subsequently completed XRT on August 19, 2021.  Patient had CT scan on Dec 20, 2021 that revealed mild abdominal lymphadenopathy.  Cystoscopy on January 07, 2022 did not reveal any evidence of recurrence.  PET scan results from May 28, 2022 with widespread metastatic disease.  Patient initiated single agent Keytruda on June 08, 2022.  ASSESSMENT: Progressive stage IV bladder cancer.  PLAN:    Progressive stage IV bladder cancer: See oncology history as above.  Repeat PET scan on August 27, 2022 reviewed independently and reported as above with essentially resolution of patient's metastatic disease.  Proceed with cycle 6 of Keytruda today.  Plan to continue treatment for at least 1 to 2 years.  Return to clinic in 3 weeks for treatment only and then in 6 weeks for further evaluation and consideration of cycle 8.     Hypomagnesia: Resolved.  Continue oral magnesium supplementation.   Neutropenia: Chronic and unchanged.  Patient's ANC is 900 today.  Proceed with treatment as above. Anemia: Hemoglobin has been up to 11.5.  Thrombocytopenia: Platelets improved to 144. A-fib: Patient states that she had a referral to cardiology, but did not keep this appointment.   Hypokalemia: Resolved.  Continue oral potassium supplementation. Transaminitis: Patient has a chronically elevated AST.  Monitor.   Patient expressed understanding and was in agreement with this plan. She also understands that She can call clinic at any time with any questions, concerns, or complaints.    Cancer Staging  Bladder cancer [facility]) Staging form: Urinary Bladder, AJCC 8th Edition - Clinical stage from 06/04/2021: Stage II (cT2, cN0, cM0) - Signed by Lloyd Huger, MD on 06/04/2021 WHO/ISUP grade (low/high): High Grade Histologic grading system: 2 grade system  Lloyd Huger, MD   09/21/2022 10:14 AM

## 2022-09-21 NOTE — Patient Instructions (Signed)
[facility] [place] [facility]  Discharge Instructions: Thank you for choosing [facility] to provide your oncology and hematology care.  If you have a lab appointment with the Cancer Center, please go directly to the Cancer Center and check in at the registration area.  Wear comfortable clothing and clothing appropriate for easy access to any Portacath or PICC line.   We strive to give you quality time with your provider. You may need to reschedule your appointment if you arrive late (15 or more minutes).  Arriving late affects you and other patients whose appointments are after yours.  Also, if you miss three or more appointments without notifying the office, you may be dismissed from the clinic at the provider's discretion.      For prescription refill requests, have your pharmacy contact our office and allow 72 hours for refills to be completed.    Today you received the following chemotherapy and/or immunotherapy agents- Keytruda      To help prevent nausea and vomiting after your treatment, we encourage you to take your nausea medication as directed.  BELOW ARE SYMPTOMS THAT SHOULD BE REPORTED IMMEDIATELY: *FEVER GREATER THAN 100.4 F (38 C) OR HIGHER *CHILLS OR SWEATING *NAUSEA AND VOMITING THAT IS NOT CONTROLLED WITH YOUR NAUSEA MEDICATION *UNUSUAL SHORTNESS OF BREATH *UNUSUAL BRUISING OR BLEEDING *URINARY PROBLEMS (pain or burning when urinating, or frequent urination) *BOWEL PROBLEMS (unusual diarrhea, constipation, pain near the anus) TENDERNESS IN MOUTH AND THROAT WITH OR WITHOUT PRESENCE OF ULCERS (sore throat, sores in mouth, or a toothache) UNUSUAL RASH, SWELLING OR PAIN  UNUSUAL VAGINAL DISCHARGE OR ITCHING   Items with * indicate a potential emergency and should be followed up as soon as possible or go to the Emergency Department if any problems should occur.  Please show the CHEMOTHERAPY ALERT CARD or IMMUNOTHERAPY ALERT CARD at check-in to  the Emergency Department and triage nurse.  Should you have questions after your visit or need to cancel or reschedule your appointment, please contact [facility] [place] [facility]  [phone] and follow the prompts.  Office hours are 8:00 a.m. to 4:30 p.m. Monday - Friday. Please note that voicemails left after 4:00 p.m. may not be returned until the following business day.  We are closed weekends and major holidays. You have access to a nurse at all times for urgent questions. Please call the main number to the clinic [phone] and follow the prompts.  For any non-urgent questions, you may also contact your provider using MyChart. We now offer e-Visits for anyone 18 and older to request care online for non-urgent symptoms. For details visit [email or web address].   Also download the MyChart app! Go to the app store, search "MyChart", open the app, select [place], and log in with your MyChart username and password.   

## 2022-09-23 LAB — T4: T4, Total: 13.4 ug/dL — ABNORMAL HIGH (ref 4.5–12.0)

## 2022-09-28 ENCOUNTER — Ambulatory Visit: Payer: 59 | Admitting: Gastroenterology

## 2022-10-02 ENCOUNTER — Other Ambulatory Visit: Payer: Self-pay

## 2022-10-02 ENCOUNTER — Inpatient Hospital Stay: Payer: 59

## 2022-10-02 ENCOUNTER — Emergency Department: Payer: 59

## 2022-10-02 ENCOUNTER — Inpatient Hospital Stay
Admission: EM | Admit: 2022-10-02 | Discharge: 2022-10-12 | DRG: 871 | Disposition: A | Discharge status: Home / Self Care | Payer: 59 | Attending: Internal Medicine | Admitting: Internal Medicine

## 2022-10-02 DIAGNOSIS — Z800 Family history of malignant neoplasm of digestive organs: Secondary | ICD-10-CM

## 2022-10-02 DIAGNOSIS — I959 Hypotension, unspecified: Secondary | ICD-10-CM | POA: Diagnosis present

## 2022-10-02 DIAGNOSIS — R6521 Severe sepsis with septic shock: Secondary | ICD-10-CM | POA: Diagnosis present

## 2022-10-02 DIAGNOSIS — Z7901 Long term (current) use of anticoagulants: Secondary | ICD-10-CM | POA: Diagnosis not present

## 2022-10-02 DIAGNOSIS — E860 Dehydration: Secondary | ICD-10-CM | POA: Diagnosis not present

## 2022-10-02 DIAGNOSIS — E8809 Other disorders of plasma-protein metabolism, not elsewhere classified: Secondary | ICD-10-CM | POA: Diagnosis present

## 2022-10-02 DIAGNOSIS — A403 Sepsis due to Streptococcus pneumoniae: Principal | ICD-10-CM | POA: Diagnosis present

## 2022-10-02 DIAGNOSIS — N1330 Unspecified hydronephrosis: Secondary | ICD-10-CM | POA: Diagnosis present

## 2022-10-02 DIAGNOSIS — E876 Hypokalemia: Secondary | ICD-10-CM | POA: Diagnosis present

## 2022-10-02 DIAGNOSIS — D509 Iron deficiency anemia, unspecified: Secondary | ICD-10-CM | POA: Diagnosis present

## 2022-10-02 DIAGNOSIS — B953 Streptococcus pneumoniae as the cause of diseases classified elsewhere: Secondary | ICD-10-CM | POA: Insufficient documentation

## 2022-10-02 DIAGNOSIS — Z8616 Personal history of COVID-19: Secondary | ICD-10-CM | POA: Diagnosis not present

## 2022-10-02 DIAGNOSIS — K8001 Calculus of gallbladder with acute cholecystitis with obstruction: Secondary | ICD-10-CM | POA: Diagnosis present

## 2022-10-02 DIAGNOSIS — I480 Paroxysmal atrial fibrillation: Secondary | ICD-10-CM | POA: Diagnosis present

## 2022-10-02 DIAGNOSIS — I252 Old myocardial infarction: Secondary | ICD-10-CM

## 2022-10-02 DIAGNOSIS — D649 Anemia, unspecified: Secondary | ICD-10-CM | POA: Diagnosis not present

## 2022-10-02 DIAGNOSIS — K810 Acute cholecystitis: Secondary | ICD-10-CM

## 2022-10-02 DIAGNOSIS — C670 Malignant neoplasm of trigone of bladder: Secondary | ICD-10-CM | POA: Diagnosis not present

## 2022-10-02 DIAGNOSIS — K7469 Other cirrhosis of liver: Secondary | ICD-10-CM | POA: Diagnosis not present

## 2022-10-02 DIAGNOSIS — R6520 Severe sepsis without septic shock: Secondary | ICD-10-CM | POA: Diagnosis not present

## 2022-10-02 DIAGNOSIS — A419 Sepsis, unspecified organism: Secondary | ICD-10-CM | POA: Diagnosis not present

## 2022-10-02 DIAGNOSIS — K7460 Unspecified cirrhosis of liver: Secondary | ICD-10-CM | POA: Diagnosis present

## 2022-10-02 DIAGNOSIS — E871 Hypo-osmolality and hyponatremia: Secondary | ICD-10-CM | POA: Diagnosis not present

## 2022-10-02 DIAGNOSIS — I2510 Atherosclerotic heart disease of native coronary artery without angina pectoris: Secondary | ICD-10-CM | POA: Diagnosis present

## 2022-10-02 DIAGNOSIS — C679 Malignant neoplasm of bladder, unspecified: Secondary | ICD-10-CM | POA: Diagnosis present

## 2022-10-02 DIAGNOSIS — Z7189 Other specified counseling: Secondary | ICD-10-CM | POA: Diagnosis not present

## 2022-10-02 DIAGNOSIS — Z888 Allergy status to other drugs, medicaments and biological substances status: Secondary | ICD-10-CM

## 2022-10-02 DIAGNOSIS — I5022 Chronic systolic (congestive) heart failure: Secondary | ICD-10-CM | POA: Insufficient documentation

## 2022-10-02 DIAGNOSIS — I330 Acute and subacute infective endocarditis: Secondary | ICD-10-CM | POA: Diagnosis present

## 2022-10-02 DIAGNOSIS — Z8551 Personal history of malignant neoplasm of bladder: Secondary | ICD-10-CM

## 2022-10-02 DIAGNOSIS — K819 Cholecystitis, unspecified: Secondary | ICD-10-CM | POA: Diagnosis not present

## 2022-10-02 DIAGNOSIS — B955 Unspecified streptococcus as the cause of diseases classified elsewhere: Secondary | ICD-10-CM | POA: Insufficient documentation

## 2022-10-02 DIAGNOSIS — R7989 Other specified abnormal findings of blood chemistry: Secondary | ICD-10-CM | POA: Diagnosis present

## 2022-10-02 DIAGNOSIS — Z880 Allergy status to penicillin: Secondary | ICD-10-CM

## 2022-10-02 DIAGNOSIS — Z66 Do not resuscitate: Secondary | ICD-10-CM | POA: Diagnosis present

## 2022-10-02 DIAGNOSIS — Z801 Family history of malignant neoplasm of trachea, bronchus and lung: Secondary | ICD-10-CM

## 2022-10-02 DIAGNOSIS — I700 Atherosclerosis of aorta: Secondary | ICD-10-CM | POA: Diagnosis present

## 2022-10-02 DIAGNOSIS — D508 Other iron deficiency anemias: Secondary | ICD-10-CM | POA: Diagnosis not present

## 2022-10-02 DIAGNOSIS — I110 Hypertensive heart disease with heart failure: Secondary | ICD-10-CM | POA: Diagnosis present

## 2022-10-02 DIAGNOSIS — I4891 Unspecified atrial fibrillation: Secondary | ICD-10-CM | POA: Diagnosis not present

## 2022-10-02 DIAGNOSIS — Z79899 Other long term (current) drug therapy: Secondary | ICD-10-CM

## 2022-10-02 DIAGNOSIS — I9589 Other hypotension: Secondary | ICD-10-CM | POA: Diagnosis not present

## 2022-10-02 DIAGNOSIS — R7881 Bacteremia: Secondary | ICD-10-CM | POA: Insufficient documentation

## 2022-10-02 DIAGNOSIS — F17210 Nicotine dependence, cigarettes, uncomplicated: Secondary | ICD-10-CM | POA: Diagnosis present

## 2022-10-02 DIAGNOSIS — I340 Nonrheumatic mitral (valve) insufficiency: Secondary | ICD-10-CM | POA: Diagnosis present

## 2022-10-02 DIAGNOSIS — Z515 Encounter for palliative care: Secondary | ICD-10-CM

## 2022-10-02 LAB — CBC
HCT: 35.4 % — ABNORMAL LOW (ref 36.0–46.0)
Hemoglobin: 11.4 g/dL — ABNORMAL LOW (ref 12.0–15.0)
MCH: 34.9 pg — ABNORMAL HIGH (ref 26.0–34.0)
MCHC: 32.2 g/dL (ref 30.0–36.0)
MCV: 108.3 fL — ABNORMAL HIGH (ref 80.0–100.0)
Platelets: 241 10*3/uL (ref 150–400)
RBC: 3.27 MIL/uL — ABNORMAL LOW (ref 3.87–5.11)
RDW: 13.9 % (ref 11.5–15.5)
WBC: 5.6 10*3/uL (ref 4.0–10.5)
nRBC: 0 % (ref 0.0–0.2)

## 2022-10-02 LAB — URINALYSIS, W/ REFLEX TO CULTURE (INFECTION SUSPECTED)
Glucose, UA: NEGATIVE mg/dL
Hgb urine dipstick: NEGATIVE
Ketones, ur: NEGATIVE mg/dL
Nitrite: NEGATIVE
Protein, ur: 100 mg/dL — AB
Specific Gravity, Urine: 1.019 (ref 1.005–1.030)
pH: 6 (ref 5.0–8.0)

## 2022-10-02 LAB — COMPREHENSIVE METABOLIC PANEL
ALT: 16 U/L (ref 0–44)
AST: 40 U/L (ref 15–41)
Albumin: 2.3 g/dL — ABNORMAL LOW (ref 3.5–5.0)
Alkaline Phosphatase: 64 U/L (ref 38–126)
Anion gap: 14 (ref 5–15)
BUN: 8 mg/dL (ref 8–23)
CO2: 17 mmol/L — ABNORMAL LOW (ref 22–32)
Calcium: 7.9 mg/dL — ABNORMAL LOW (ref 8.9–10.3)
Chloride: 105 mmol/L (ref 98–111)
Creatinine, Ser: 0.82 mg/dL (ref 0.44–1.00)
GFR, Estimated: >60 mL/min (ref >60)
Glucose, Bld: 112 mg/dL — ABNORMAL HIGH (ref 70–99)
Potassium: 3.3 mmol/L — ABNORMAL LOW (ref 3.5–5.1)
Sodium: 136 mmol/L (ref 135–145)
Total Bilirubin: 1.2 mg/dL (ref 0.3–1.2)
Total Protein: 7.4 g/dL (ref 6.5–8.1)

## 2022-10-02 LAB — URINALYSIS, ROUTINE W REFLEX MICROSCOPIC
Bilirubin Urine: NEGATIVE
Glucose, UA: NEGATIVE mg/dL
Hgb urine dipstick: NEGATIVE
Ketones, ur: NEGATIVE mg/dL
Nitrite: NEGATIVE
Protein, ur: 100 mg/dL — AB
Specific Gravity, Urine: 1.019 (ref 1.005–1.030)
Squamous Epithelial / HPF: NONE SEEN /HPF (ref 0–5)
WBC, UA: NONE SEEN WBC/hpf (ref 0–5)
pH: 6 (ref 5.0–8.0)

## 2022-10-02 LAB — LIPASE, BLOOD: Lipase: 55 U/L — ABNORMAL HIGH (ref 11–51)

## 2022-10-02 LAB — T4, FREE: Free T4: 1.53 ng/dL — ABNORMAL HIGH (ref 0.61–1.12)

## 2022-10-02 LAB — PROTIME-INR
INR: 1.7 — ABNORMAL HIGH (ref 0.8–1.2)
Prothrombin Time: 20.1 seconds — ABNORMAL HIGH (ref 11.4–15.2)

## 2022-10-02 LAB — TROPONIN I (HIGH SENSITIVITY)
Troponin I (High Sensitivity): 17 ng/L (ref <18)
Troponin I (High Sensitivity): 25 ng/L — ABNORMAL HIGH (ref <18)

## 2022-10-02 LAB — APTT: aPTT: 35 seconds (ref 24–36)

## 2022-10-02 LAB — LACTIC ACID, PLASMA
Lactic Acid, Venous: >9 mmol/L (ref 0.5–1.9)
Lactic Acid, Venous: 5.8 mmol/L (ref 0.5–1.9)

## 2022-10-02 LAB — TSH: TSH: 8.816 u[IU]/mL — ABNORMAL HIGH (ref 0.350–4.500)

## 2022-10-02 LAB — MAGNESIUM: Magnesium: 1.6 mg/dL — ABNORMAL LOW (ref 1.7–2.4)

## 2022-10-02 MED ORDER — VITAMIN K1 10 MG/ML IJ SOLN
5.0000 mg | Freq: Once | INTRAVENOUS | Status: AC
  Administered 2022-10-03: 5 mg via INTRAVENOUS
  Filled 2022-10-02: qty 0.5

## 2022-10-02 MED ORDER — ALBUMIN HUMAN 25 % IV SOLN
25.0000 g | Freq: Once | INTRAVENOUS | Status: AC
  Administered 2022-10-02: 25 g via INTRAVENOUS
  Filled 2022-10-02: qty 100

## 2022-10-02 MED ORDER — MORPHINE SULFATE (PF) 2 MG/ML IV SOLN
2.0000 mg | INTRAVENOUS | Status: DC | PRN
  Administered 2022-10-03 – 2022-10-09 (×13): 2 mg via INTRAVENOUS
  Filled 2022-10-02 (×14): qty 1

## 2022-10-02 MED ORDER — LACTATED RINGERS IV SOLN: INTRAVENOUS | Status: DC

## 2022-10-02 MED ORDER — SODIUM CHLORIDE 0.9 % IV BOLUS
1000.0000 mL | Freq: Once | INTRAVENOUS | Status: AC
  Administered 2022-10-02: 1000 mL via INTRAVENOUS

## 2022-10-02 MED ORDER — PIPERACILLIN-TAZOBACTAM 3.375 G IVPB
3.3750 g | Freq: Three times a day (TID) | INTRAVENOUS | Status: AC
  Administered 2022-10-03 – 2022-10-05 (×8): 3.375 g via INTRAVENOUS
  Filled 2022-10-02 (×8): qty 50

## 2022-10-02 MED ORDER — ONDANSETRON HCL 4 MG/2ML IJ SOLN
4.0000 mg | Freq: Four times a day (QID) | INTRAMUSCULAR | Status: DC | PRN
  Administered 2022-10-03 – 2022-10-04 (×3): 4 mg via INTRAVENOUS
  Filled 2022-10-02 (×3): qty 2

## 2022-10-02 MED ORDER — ONDANSETRON HCL 4 MG PO TABS: 4.0000 mg | ORAL_TABLET | Freq: Four times a day (QID) | ORAL | Status: DC | PRN

## 2022-10-02 MED ORDER — MORPHINE SULFATE (PF) 4 MG/ML IV SOLN
4.0000 mg | Freq: Once | INTRAVENOUS | Status: AC
  Administered 2022-10-02: 4 mg via INTRAVENOUS
  Filled 2022-10-02: qty 1

## 2022-10-02 MED ORDER — METRONIDAZOLE 500 MG/100ML IV SOLN
500.0000 mg | Freq: Once | INTRAVENOUS | Status: AC
  Administered 2022-10-02: 500 mg via INTRAVENOUS
  Filled 2022-10-02: qty 100

## 2022-10-02 MED ORDER — SODIUM CHLORIDE 0.9 % IV SOLN: Freq: Once | INTRAVENOUS | Status: DC

## 2022-10-02 MED ORDER — ACETAMINOPHEN 500 MG PO TABS
1000.0000 mg | ORAL_TABLET | Freq: Once | ORAL | Status: AC
  Administered 2022-10-02: 1000 mg via ORAL
  Filled 2022-10-02: qty 2

## 2022-10-02 MED ORDER — METOPROLOL TARTRATE 5 MG/5ML IV SOLN: 10.0000 mg | Freq: Four times a day (QID) | INTRAVENOUS | Status: DC | PRN

## 2022-10-02 MED ORDER — ONDANSETRON HCL 4 MG/2ML IJ SOLN: 4.0000 mg | Freq: Once | INTRAMUSCULAR | Status: DC

## 2022-10-02 MED ORDER — SODIUM CHLORIDE 0.9 % IV SOLN
2.0000 g | Freq: Once | INTRAVENOUS | Status: AC
  Administered 2022-10-02: 2 g via INTRAVENOUS
  Filled 2022-10-02: qty 12.5

## 2022-10-02 MED ORDER — IOHEXOL 300 MG/ML  SOLN
100.0000 mL | Freq: Once | INTRAMUSCULAR | Status: AC | PRN
  Administered 2022-10-02: 100 mL via INTRAVENOUS

## 2022-10-02 MED ORDER — PANTOPRAZOLE SODIUM 40 MG IV SOLR
40.0000 mg | Freq: Once | INTRAVENOUS | Status: AC
  Administered 2022-10-02: 40 mg via INTRAVENOUS
  Filled 2022-10-02: qty 10

## 2022-10-02 MED ORDER — VANCOMYCIN HCL IN DEXTROSE 1-5 GM/200ML-% IV SOLN
1000.0000 mg | Freq: Once | INTRAVENOUS | Status: AC
  Administered 2022-10-02: 1000 mg via INTRAVENOUS
  Filled 2022-10-02: qty 200

## 2022-10-02 NOTE — Consult Note (Signed)
PHARMACY -  BRIEF ANTIBIOTIC NOTE   Pharmacy has received consult(s) for vancomycin from an ED provider.  The patient's profile has been reviewed for ht/wt/allergies/indication/available labs.    One time order(s) placed for vancomycin 1gm IV x1  Further antibiotics/pharmacy consults should be ordered by admitting physician if indicated.                       Thank you, Darrick Penna 10/02/2022  7:11 PM

## 2022-10-02 NOTE — ED Notes (Signed)
1st set of cultures and lactic sent down.  Pt is cold to the touch and unable to obtain oral temp. Will obtain rectal temp and pulse ox in room.

## 2022-10-02 NOTE — Progress Notes (Signed)
Pharmacy Antibiotic Note  Kristina Ellis is a 72 y.o. female admitted on 10/02/2022 with intra-abdominal infection.  Pharmacy has been consulted for Zosyn dosing.  Plan: Zosyn 3.375g IV q8h (4 hour infusion).  Pharmacy will continue to follow and will adjust abx dosing whenever warranted.  Temp (24hrs), Avg:99.2 F (37.3 C), Min:98.4 F (36.9 C), Max:100.5 F (38.1 C)   Recent Labs  Lab 10/02/22 1832 10/02/22 1833 10/02/22 1834 10/02/22 2104  WBC  --   --  5.6  --   CREATININE  --  0.82  --   --   LATICACIDVEN >9.0*  --   --  5.8*    Estimated Creatinine Clearance: 47.4 mL/min (by C-G formula based on SCr of 0.82 mg/dL).    Allergies  Allergen Reactions   Atenolol Other (See Comments)    bradycardia bradycardia    Penicillins Rash    Antimicrobials this admission: 2/24 Cefepime >> x 1 dose 2/24 Flagyl >> x 1 dose 2/24 Vancomycin >> x 1 dose 2/25 Zosyn >>  Microbiology results: 2/24 BCx: Pending  Thank you for allowing pharmacy to be a part of this patient's care.  Renda Rolls, PharmD, [facility] 10/02/2022 11:10 PM

## 2022-10-02 NOTE — ED Provider Notes (Signed)
[facility] Provider Note    Event Date/Time   First MD Initiated Contact with Patient 10/02/22 1846     (approximate)   History   Abdominal Pain (X2 days/)   HPI  Kristina Ellis is a 72 y.o. female   Past medical history of cancer on Keytruda, atrial fibrillation on Eliquis, CAD, hepatic cirrhosis, hypertension, mitral stenosis, nephrolithiasis, who presents to the emergency department with 2 days of severe abdominal pain.  She denies fever or chills, no dysuria.  No trauma.   She arrives ill-appearing, shivering, with a temperature of 100.5 and a heart rate of 170 but fortunately is normotensive.  Her respiratory rate is elevated at 24.  Concern for sepsis.  Independent Historian contributed to assessment above: Son at bedside  External Medical Documents Reviewed: Oncology note from January 2024, on [place] for cancer      Physical Exam   Triage Vital Signs: ED Triage Vitals  Enc Vitals Group     BP 10/02/22 1831 (!) 124/98     Pulse Rate 10/02/22 1842 (!) 170     Resp 10/02/22 1831 (!) 24     Temp 10/02/22 1839 (!) 100.5 F (38.1 C)     Temp Source 10/02/22 1839 Rectal     SpO2 10/02/22 1842 100 %     Weight 10/02/22 1827 120 lb 2.4 oz (54.5 kg)     Height 10/02/22 1827 '4\' 11"'$  (1.499 m)     Head Circumference --      Peak Flow --      Pain Score 10/02/22 1827 10     Pain Loc --      Pain Edu? --      Excl. in [place]? --     Most recent vital signs: Vitals:   10/02/22 2230 10/02/22 2300  BP: 104/76 112/86  Pulse: (!) 125 (!) 127  Resp: 15 17  Temp: 98.6 F (37 C)   SpO2: 98% 98%    General: Awake, hectic chronically ill-appearing, shivering tachycardic irregular tachypneic and febrile CV:  Heart rate is tachycardic and irregular Resp:  Lungs are clear, she has elevated respiratory rate Abd:  No distention.  Soft but with severe pain to palpation diffusely    ED Results / Procedures / Treatments   Labs (all labs ordered  are listed, but only abnormal results are displayed) Labs Reviewed  CBC - Abnormal; Notable for the following components:      Result Value   RBC 3.27 (*)    Hemoglobin 11.4 (*)    HCT 35.4 (*)    MCV 108.3 (*)    MCH 34.9 (*)    All other components within normal limits  URINALYSIS, ROUTINE W REFLEX MICROSCOPIC - Abnormal; Notable for the following components:   Color, Urine AMBER (*)    APPearance HAZY (*)    Protein, ur 100 (*)    Leukocytes,Ua TRACE (*)    Bacteria, UA RARE (*)    All other components within normal limits  COMPREHENSIVE METABOLIC PANEL - Abnormal; Notable for the following components:   Potassium 3.3 (*)    CO2 17 (*)    Glucose, Bld 112 (*)    Calcium 7.9 (*)    Albumin 2.3 (*)    All other components within normal limits  LIPASE, BLOOD - Abnormal; Notable for the following components:   Lipase 55 (*)    All other components within normal limits  LACTIC ACID, PLASMA - Abnormal; Notable for  the following components:   Lactic Acid, Venous >9.0 (*)    All other components within normal limits  LACTIC ACID, PLASMA - Abnormal; Notable for the following components:   Lactic Acid, Venous 5.8 (*)    All other components within normal limits  URINALYSIS, W/ REFLEX TO CULTURE (INFECTION SUSPECTED) - Abnormal; Notable for the following components:   Color, Urine AMBER (*)    APPearance HAZY (*)    Bilirubin Urine SMALL (*)    Protein, ur 100 (*)    Leukocytes,Ua SMALL (*)    Bacteria, UA RARE (*)    All other components within normal limits  MAGNESIUM - Abnormal; Notable for the following components:   Magnesium 1.6 (*)    All other components within normal limits  TSH - Abnormal; Notable for the following components:   TSH 8.816 (*)    All other components within normal limits  T4, FREE - Abnormal; Notable for the following components:   Free T4 1.53 (*)    All other components within normal limits  PROTIME-INR - Abnormal; Notable for the following  components:   Prothrombin Time 20.1 (*)    INR 1.7 (*)    All other components within normal limits  TROPONIN I (HIGH SENSITIVITY) - Abnormal; Notable for the following components:   Troponin I (High Sensitivity) 25 (*)    All other components within normal limits  CULTURE, BLOOD (ROUTINE X 2)  CULTURE, BLOOD (ROUTINE X 2)  APTT  PROTIME-INR  TROPONIN I (HIGH SENSITIVITY)     I ordered and reviewed the above labs they are notable for she has a white blood cell count today of 5.6 from a previous of 1.6 earlier this month.  Her H&H is stable at 11/35.  Macrocytic.  EKG  ED ECG REPORT I, Lucillie Garfinkel, the attending physician, personally viewed and interpreted this ECG.   Date: 10/02/2022  EKG Time: 1845  Rate: 192  Rhythm: AF  Axis: nl  ST&T Change: Unclear ST/T changes in the setting of severe tachycardia with rigors complicating interpretation, but does appear to be irregular tachycardia atrial fibrillation with RVR, slightly wider than previous EKGs obtained medical chart review.  No STEMI.    RADIOLOGY I independently reviewed and interpreted chest x-ray and see no obvious focalities or pneumothorax.   PROCEDURES:  Critical Care performed: Yes, see critical care procedure note(s)  .Critical Care  Performed by: Lucillie Garfinkel, MD Authorized by: Lucillie Garfinkel, MD   Critical care provider statement:    Critical care time (minutes):  30   Critical care was time spent personally by me on the following activities:  Development of treatment plan with patient or surrogate, discussions with consultants, evaluation of patient's response to treatment, examination of patient, ordering and review of laboratory studies, ordering and review of radiographic studies, ordering and performing treatments and interventions, pulse oximetry, re-evaluation of patient's condition and review of old charts    [facility]: Medications  ondansetron (ZOFRAN) injection 4 mg (0 mg  Intravenous Hold 10/02/22 2055)  phytonadione (VITAMIN K) 5 mg in dextrose 5 % 50 mL IVPB (has no administration in time range)  albumin human 25 % solution 25 g (has no administration in time range)  sodium chloride 0.9 % bolus 1,000 mL (0 mLs Intravenous Stopped 10/02/22 2307)  ceFEPIme (MAXIPIME) 2 g in sodium chloride 0.9 % 100 mL IVPB (0 g Intravenous Stopped 10/02/22 1941)  metroNIDAZOLE (FLAGYL) IVPB 500 mg (0 mg Intravenous Stopped 10/02/22 2046)  acetaminophen (TYLENOL) tablet 1,000 mg (1,000 mg Oral Given 10/02/22 1911)  vancomycin (VANCOCIN) IVPB 1000 mg/200 mL premix (0 mg Intravenous Stopped 10/02/22 2154)  sodium chloride 0.9 % bolus 1,000 mL (0 mLs Intravenous Stopped 10/02/22 2307)  morphine (PF) 4 MG/ML injection 4 mg (4 mg Intravenous Given 10/02/22 2033)  pantoprazole (PROTONIX) injection 40 mg (40 mg Intravenous Given 10/02/22 2033)  iohexol (OMNIPAQUE) 300 MG/ML solution 100 mL (100 mLs Intravenous Contrast Given 10/02/22 2108)  morphine (PF) 4 MG/ML injection 4 mg (4 mg Intravenous Given 10/02/22 2249)    External physician / consultants:  I spoke with Dr. Dahlia Byes of general surgery regarding care plan for this patient.   IMPRESSION / MDM / ASSESSMENT AND PLAN / ED COURSE  I reviewed the triage vital signs and the nursing notes.                                Patient's presentation is most consistent with acute presentation with potential threat to life or bodily function.  Differential diagnosis includes, but is not limited to, sepsis, intra-abdominal infection, perforated viscus, progression of disease, urinary tract infection, respiratory infection, dysrhythmia including atrial fibrillation with RVR in the setting of sepsis, consider wide-complex tachycardia V. tach E but less likely given irregularity and history of atrial fibrillation with RVR, ACS   The patient is on the cardiac monitor to evaluate for evidence of arrhythmia and/or significant heart rate changes.  MDM:  This is an acutely ill patient with marked tachycardia which looks to be A-fib with RVR though slightly wide-complex, jumping from 160s to 180s, I do not believe it is ventricular tachycardia.  I hesitate to pharmacologically control this in the setting of suspected sepsis with fever, abdominal pain.  Will start with antipyretics and fluids to reassess, as well as pain control.  Broad-spectrum antibiotics ordered as well as a 30 cc/kg fluid bolus.  She was given IV morphine for pain control.  After pain control, antipyretics, initial fluids, heart rate is improving though still tachycardic not as marked as prior, now fluctuating in the 140s range.  Remains normotensive.  Initial lab work shows normal kidney function and can facilitate IV enhanced CT scan we will order a CT chest abdomen pelvis for sepsis protocol, requested radiologist look for signs of ischemia given a lactic acidosis of 9+ and pain out of proportion on abdominal exam.  Potentially perforated viscus with her history of cancer pain out of proportion, but there was no signs of free air on the chest x-ray obtained earlier.  It would be curious however given her anticoagulated status on Eliquis for A-fib  Patient remains normotensive and heart rate is improving, pending CT scan.   CT scan shows signs of cholecystitis.  Her LFTs are normal.  Heart rate is much improved normotensive and fever resolved.  Appears more comfortable.  I consulted with Dr. Dahlia Byes general surgery given her medical comorbidities and anticoagulated status we will hold off on any surgery tonight.  Admit to hospitalist service with ongoing antibiotics.  Right upper quadrant ultrasound is pending, the plan is to proceed with an MRCP if there is biliary dilation on that ultrasound, otherwise continue medical management.        FINAL CLINICAL IMPRESSION(S) / ED DIAGNOSES   Final diagnoses:  Cholecystitis  Sepsis, due to unspecified organism, unspecified whether  acute organ dysfunction present [facility])  Atrial fibrillation with RVR ([place])  Rx / DC Orders   ED Discharge Orders     None        Note:  This document was prepared using Dragon voice recognition software and may include unintentional dictation errors.    Lucillie Garfinkel, MD 10/02/22 2308

## 2022-10-02 NOTE — H&P (Signed)
History and Physical    Patient: Kristina Ellis [id number] DOB: May 06, 1951 DOA: 10/02/2022 DOS: the patient was seen and examined on 10/02/2022 PCP: [facility]  Patient coming from: Home  Chief Complaint:  Chief Complaint  Patient presents with   Abdominal Pain    X2 days    HPI: Kristina Ellis is a 72 y.o. female with medical history significant of atrial fibrillation, cholelithiasis, bladder cancer, essential hypertension, hepatitis, coronary artery disease, pulmonary hypertension who presented to the ER with right upper quadrant abdominal pain nausea vomiting.  Patient was evaluated and found to have acute cholecystitis.  Patient was seen by surgery who recommends medical admission due to Eliquis.  Patient also to have MRCP.  Surgery not to be done right away.  Patient will therefore be admitted to the medical service for the same reason.  Review of Systems: As mentioned in the history of present illness. All other systems reviewed and are negative. Past Medical History:  Diagnosis Date   Aortic atherosclerosis ([place])    Arthritis    Atrial fibrillation ([place])    a.) s/p TEE with cardioversion on 02/04/2020. b.) on daily apixaban.   Bladder mass    a.) CT 04/08/2021 --> 3.2 cm intraluminal bladder mass.   Carotid stenosis, bilateral    a.) Doppler 07/04/2020 --> mild; 1-49% stenosis BILATERALLY.   Cholelithiasis    a.) CT 11/06/2020 --> largest measured 4.5 cm   Chronic anticoagulation    a.) Apixaban   Common biliary duct calculus    a.) CT 04/08/2021 --> CBD dilated at 8 mm; 39m calculus within distal CBD.   Coronary artery disease involving native coronary artery of native heart without angina pectoris [id number]  Diastolic dysfunction    a.) TTE 07/04/2020 --> LVEF 60-65%; G1DD.   Hepatic cirrhosis (HCC)    Hepatic steatosis    Hepatitis 1970   History of 2019 novel coronavirus disease (COVID-19) 12/20/2019   History of marijuana use     Hypertension    Hypertensive retinopathy    IDA (iron deficiency anemia)    Mitral stenosis    a.) TEE 04/10/2020 --> mild. b.) TTE 07/04/2020 --> EF 60-65%; mild (mean gradient 5 mmHg).   Moderate pulmonary arterial systolic hypertension ([place] 12/22/2019   Nephrolithiasis    NSTEMI (non-ST elevated myocardial infarction) ([place] 07/26/2016   Open-angle glaucoma 09/11/2010   PAH (pulmonary artery hypertension) ([place]    a.) TTE 12/21/2019 --> PASP 44 mmHg.   Uterine fibroid    a.) CT 04/08/2021 --> multiple with largest measuring 7 cm.   Valvular regurgitation    a.) TTE 09/01/2010 --> trivial to mild pan-valvular. b.) TTE 12/21/2019 --> mild MR and AR; moderate TR. c.) TTE 07/04/2020 --> mild TR; trivial MR and PR.   Vestibular neuronitis    Past Surgical History:  Procedure Laterality Date   BREAST CYST ASPIRATION Left    COLONOSCOPY  2012   ERCP N/A 12/18/2021   Procedure: ENDOSCOPIC RETROGRADE CHOLANGIOPANCREATOGRAPHY (ERCP);  Surgeon: WLucilla Lame MD;  [facility]  Service: Endoscopy;  Laterality: N/A;   TEE WITH CARDIOVERSION N/A 02/04/2020   Procedure: TEE WITH CARDIOVERSION; Location: [facility]; Surgeon: CKandis Cocking MD   TRANSURETHRAL RESECTION OF BLADDER TUMOR N/A 05/15/2021   Procedure: TRANSURETHRAL RESECTION OF BLADDER TUMOR (TURBT);  Surgeon: SBilley Co MD;  Location: [facility] ORS;  Service: Urology;  Laterality: N/A;   Social History:  reports that she has quit smoking. Her smoking use included cigarettes.  She has a 1.00 pack-year smoking history. She has been exposed to tobacco smoke. She has never used smokeless tobacco. She reports current alcohol use of about 5.0 standard drinks of alcohol per week. She reports that she does not currently use drugs after having used the following drugs: Marijuana.  Allergies  Allergen Reactions   Atenolol Other (See Comments)    bradycardia bradycardia    Penicillins Rash    Family History  Problem Relation Age of Onset    Aneurysm Mother    Colon cancer Father    Lung cancer Brother    Breast cancer Neg Hx     Prior to Admission medications   Medication Sig Start Date End Date Taking? Authorizing Provider  amiodarone (PACERONE) 200 MG tablet Take by mouth. 02/02/22 02/02/23  [provider]  atorvastatin (LIPITOR) 40 MG tablet Take 40 mg by mouth at bedtime. Patient not taking: Reported on 08/31/2022 03/16/22   [provider]  diltiazem (CARDIZEM CD) 120 MG 24 hr capsule Take 1 capsule (120 mg total) by mouth daily. Patient not taking: Reported on 08/31/2022 04/14/22 04/14/23  Lorella Nimrod, MD  ELIQUIS 5 MG TABS tablet Take 1 tablet (5 mg total) by mouth 2 (two) times daily. 04/14/22 09/21/22  Lorella Nimrod, MD  magnesium oxide (MAG-OX) 400 MG tablet Take 1 tablet by mouth 2 (two) times daily. 03/24/20   [provider]  metoprolol succinate (TOPROL-XL) 25 MG 24 hr tablet Take 1 tablet (25 mg total) by mouth 2 (two) times daily. Take with or immediately following a meal. 04/14/22 09/21/22  Lorella Nimrod, MD  metoprolol tartrate (LOPRESSOR) 100 MG tablet Take 1 tablet by mouth 2 (two) times daily. 05/18/22 05/18/23  [provider]  Multiple Vitamin (MULTIVITAMIN WITH MINERALS) TABS tablet Take 1 tablet by mouth daily. Patient not taking: Reported on 08/31/2022 04/15/22   Lorella Nimrod, MD  oxybutynin (DITROPAN-XL) 10 MG 24 hr tablet Take 1 tablet (10 mg total) by mouth daily. Patient not taking: Reported on 08/31/2022 08/05/22   Billey Co, MD  potassium chloride SA (KLOR-CON M) 20 MEQ tablet Take 1 tablet (20 mEq total) by mouth 2 (two) times daily. 06/08/22   Lloyd Huger, MD  trolamine salicylate (ASPERCREME) 10 % cream Apply 1 application topically as needed for muscle pain.    [provider]    Physical Exam: Vitals:   10/02/22 2123 10/02/22 2145 10/02/22 2230 10/02/22 2300  BP: 114/79 112/85 104/76 112/86  Pulse: (!) 135 (!) 128 (!) 125 (!) 127  Resp: (!) 27  (!) '22 15 17  '$ Temp:   98.6 F (37 C)   TempSrc:   Oral   SpO2: 98% 98% 98% 98%  Weight:      Height:       Constitutional: Acutely ill looking, no obvious distress NAD, calm, comfortable Eyes: PERRL, lids and conjunctivae normal ENMT: Mucous membranes are moist. Posterior pharynx clear of any exudate or lesions.Normal dentition.  Neck: normal, supple, no masses, no thyromegaly Respiratory: clear to auscultation bilaterally, no wheezing, no crackles. Normal respiratory effort. No accessory muscle use.  Cardiovascular: Sinus tachycardia, no murmurs / rubs / gallops. No extremity edema. 2+ pedal pulses. No carotid bruits.  Abdomen: Right upper quadrant abdominal tenderness, no masses palpated. No hepatosplenomegaly. Bowel sounds positive.  Musculoskeletal: Good range of motion, no joint swelling or tenderness, Skin: no rashes, lesions, ulcers. No induration Neurologic: CN 2-12 grossly intact. Sensation intact, DTR normal. Strength 5/5 in all 4.  Psychiatric: Normal judgment and insight. Alert and oriented x 3. Normal mood  Data Reviewed:  Temperature 100.5 blood pressure 160/94, pulse 119, respiratory 32 oxygen sat 98% room air.  Lactic acid 5.8.  White count is 5.6 hemoglobin 11.4 platelets 241.  Potassium 3.3 CO2 17 calcium 7.9 troponin 25 INR 1.7.  Urinalysis essentially negative.  CT abdomen and pelvis shows intra-abdominal small right pleural effusion trace interstitial pulmonary edema mild ascites, cholelithiasis with gallstone impacted within the gallbladder neck.  There was interval development of pericholecystic infiltration which may relate to inflammatory changes.  Also cirrhosis as stable left hydronephrosis and hydroureter to the level of the left ureterovesical junction.  Patient has stable subacute fractures at T12.  Assessment and Plan:  #1 acute cholecystitis: Initiate IV antibiotics.  Surgical consult initiated.  MRI of the abdomen to be done prior to surgery.  We will  continue to hold Eliquis. keep n.p.o. continue other supportive care.  #2 coronary artery disease: Stable.  Continue to monitor.  #3 hypokalemia: Continue to report  #4 lactic acidosis: Continue to monitor  #5 bladder cancer: Stable.  Continue per oncology  #6 A-fib with RVR: Rate is controlled.  Will use IV Lopressor while patient is NPO.  Holding Eliquis    Advance Care Planning:   Code Status: Full Code   Consults: General surgery  Family Communication: No family at bedside  Severity of Illness: The appropriate patient status for this patient is INPATIENT. Inpatient status is judged to be reasonable and necessary in order to provide the required intensity of service to ensure the patient's safety. The patient's presenting symptoms, physical exam findings, and initial radiographic and laboratory data in the context of their chronic comorbidities is felt to place them at high risk for further clinical deterioration. Furthermore, it is not anticipated that the patient will be medically stable for discharge from the hospital within 2 midnights of admission.   * I certify that at the point of admission it is my clinical judgment that the patient will require inpatient hospital care spanning beyond 2 midnights from the point of admission due to high intensity of service, high risk for further deterioration and high frequency of surveillance required.*  AuthorBarbette Merino, MD 10/02/2022 11:07 PM  For on call review [email or web address].

## 2022-10-02 NOTE — ED Triage Notes (Signed)
Pt to ED from home for abdominal pain. Pt does have pain during urination as she has bladder cancer. Pt is CAOx4 and in obvious pain in triage. Pt is shaking. Pt denies N/V/D or CP at this time. Pt has  not had a fever at home either. Pt last BM this morning.

## 2022-10-02 NOTE — Progress Notes (Signed)
72 yo female w history of stage IV bladder CA has a past medical history of Aortic atherosclerosis (HCC), Arthritis, Atrial fibrillation  on anticoagulation, Diastolic dysfunction, Hepatic cirrhosis ([place]), Hepatic steatosis, Hepatitis > She has had episodes of cholecystitis and choledocho requiring ERCP 2023. SHe has refused cholecystectomy in the past. Apparently was in hospice at some point in time but since started immunotherapy she has had good response to metastatic disease,  she still has some bony residual disease Ct pers reviewed c/w cholecystitis  , liver cirrhosis and anasarca.  Plan Admission to hospitalist w resuscitation and antibiotics Vit k recheck inr in am Not surgical candidate given anticoagulation, metastatic cancer and refusAL to cholecystectomy in the past. We will be happy to consult and follow her

## 2022-10-03 ENCOUNTER — Inpatient Hospital Stay: Payer: 59

## 2022-10-03 DIAGNOSIS — C670 Malignant neoplasm of trigone of bladder: Secondary | ICD-10-CM | POA: Diagnosis not present

## 2022-10-03 DIAGNOSIS — E876 Hypokalemia: Secondary | ICD-10-CM

## 2022-10-03 DIAGNOSIS — A419 Sepsis, unspecified organism: Secondary | ICD-10-CM

## 2022-10-03 DIAGNOSIS — B953 Streptococcus pneumoniae as the cause of diseases classified elsewhere: Secondary | ICD-10-CM | POA: Insufficient documentation

## 2022-10-03 DIAGNOSIS — I4891 Unspecified atrial fibrillation: Secondary | ICD-10-CM | POA: Diagnosis not present

## 2022-10-03 DIAGNOSIS — R6520 Severe sepsis without septic shock: Secondary | ICD-10-CM

## 2022-10-03 DIAGNOSIS — R7881 Bacteremia: Secondary | ICD-10-CM | POA: Insufficient documentation

## 2022-10-03 DIAGNOSIS — K810 Acute cholecystitis: Secondary | ICD-10-CM | POA: Diagnosis not present

## 2022-10-03 DIAGNOSIS — B955 Unspecified streptococcus as the cause of diseases classified elsewhere: Secondary | ICD-10-CM | POA: Insufficient documentation

## 2022-10-03 DIAGNOSIS — I2510 Atherosclerotic heart disease of native coronary artery without angina pectoris: Secondary | ICD-10-CM

## 2022-10-03 LAB — COMPREHENSIVE METABOLIC PANEL
ALT: 13 U/L (ref 0–44)
AST: 30 U/L (ref 15–41)
Albumin: 1.9 g/dL — ABNORMAL LOW (ref 3.5–5.0)
Alkaline Phosphatase: 40 U/L (ref 38–126)
Anion gap: 8 (ref 5–15)
BUN: 9 mg/dL (ref 8–23)
CO2: 20 mmol/L — ABNORMAL LOW (ref 22–32)
Calcium: 7.4 mg/dL — ABNORMAL LOW (ref 8.9–10.3)
Chloride: 109 mmol/L (ref 98–111)
Creatinine, Ser: 0.84 mg/dL (ref 0.44–1.00)
GFR, Estimated: >60 mL/min (ref >60)
Glucose, Bld: 90 mg/dL (ref 70–99)
Potassium: 3.5 mmol/L (ref 3.5–5.1)
Sodium: 137 mmol/L (ref 135–145)
Total Bilirubin: 2 mg/dL — ABNORMAL HIGH (ref 0.3–1.2)
Total Protein: 6 g/dL — ABNORMAL LOW (ref 6.5–8.1)

## 2022-10-03 LAB — PHOSPHORUS: Phosphorus: 4.3 mg/dL (ref 2.5–4.6)

## 2022-10-03 LAB — CBC WITH DIFFERENTIAL/PLATELET
Abs Immature Granulocytes: 0.04 10*3/uL (ref 0.00–0.07)
Basophils Absolute: 0 10*3/uL (ref 0.0–0.1)
Basophils Relative: 0 %
Eosinophils Absolute: 0 10*3/uL (ref 0.0–0.5)
Eosinophils Relative: 0 %
HCT: 32.2 % — ABNORMAL LOW (ref 36.0–46.0)
Hemoglobin: 10.7 g/dL — ABNORMAL LOW (ref 12.0–15.0)
Immature Granulocytes: 0 %
Lymphocytes Relative: 3 %
Lymphs Abs: 0.3 10*3/uL — ABNORMAL LOW (ref 0.7–4.0)
MCH: 34.7 pg — ABNORMAL HIGH (ref 26.0–34.0)
MCHC: 33.2 g/dL (ref 30.0–36.0)
MCV: 104.5 fL — ABNORMAL HIGH (ref 80.0–100.0)
Monocytes Absolute: 0.6 10*3/uL (ref 0.1–1.0)
Monocytes Relative: 6 %
Neutro Abs: 9 10*3/uL — ABNORMAL HIGH (ref 1.7–7.7)
Neutrophils Relative %: 91 %
Platelets: 151 10*3/uL (ref 150–400)
RBC: 3.08 MIL/uL — ABNORMAL LOW (ref 3.87–5.11)
RDW: 14.2 % (ref 11.5–15.5)
WBC: 10 10*3/uL (ref 4.0–10.5)
nRBC: 0 % (ref 0.0–0.2)

## 2022-10-03 LAB — PROTIME-INR
INR: 1.7 — ABNORMAL HIGH (ref 0.8–1.2)
Prothrombin Time: 20.2 seconds — ABNORMAL HIGH (ref 11.4–15.2)

## 2022-10-03 LAB — BLOOD CULTURE ID PANEL (REFLEXED) - BCID2

## 2022-10-03 LAB — MAGNESIUM: Magnesium: 1.2 mg/dL — ABNORMAL LOW (ref 1.7–2.4)

## 2022-10-03 LAB — LACTIC ACID, PLASMA
Lactic Acid, Venous: 2.8 mmol/L (ref 0.5–1.9)
Lactic Acid, Venous: 1.9 mmol/L (ref 0.5–1.9)

## 2022-10-03 LAB — LIPASE, BLOOD: Lipase: 44 U/L (ref 11–51)

## 2022-10-03 MED ORDER — POTASSIUM CHLORIDE 2 MEQ/ML IV SOLN
INTRAVENOUS | Status: DC
  Filled 2022-10-03 (×4): qty 1000

## 2022-10-03 MED ORDER — METRONIDAZOLE 500 MG/100ML IV SOLN: 500.0000 mg | Freq: Two times a day (BID) | INTRAVENOUS | Status: DC

## 2022-10-03 MED ORDER — SODIUM CHLORIDE 0.9 % IV SOLN: 1.0000 g | INTRAVENOUS | Status: DC

## 2022-10-03 MED ORDER — VITAMIN K1 10 MG/ML IJ SOLN
5.0000 mg | Freq: Once | INTRAVENOUS | Status: AC
  Administered 2022-10-03: 5 mg via INTRAVENOUS
  Filled 2022-10-03: qty 0.5

## 2022-10-03 MED ORDER — GADOBUTROL 1 MMOL/ML IV SOLN
5.0000 mL | Freq: Once | INTRAVENOUS | Status: AC | PRN
  Administered 2022-10-03: 5 mL via INTRAVENOUS

## 2022-10-03 MED ORDER — METOPROLOL TARTRATE 5 MG/5ML IV SOLN
2.5000 mg | Freq: Four times a day (QID) | INTRAVENOUS | Status: DC
  Administered 2022-10-03 – 2022-10-04 (×3): 2.5 mg via INTRAVENOUS
  Filled 2022-10-03 (×4): qty 5

## 2022-10-03 MED ORDER — MAGNESIUM SULFATE 4 GM/100ML IV SOLN
4.0000 g | Freq: Once | INTRAVENOUS | Status: AC
  Administered 2022-10-03: 4 g via INTRAVENOUS
  Filled 2022-10-03: qty 100

## 2022-10-03 NOTE — Progress Notes (Signed)
PROGRESS NOTE  Kristina Ellis [id number] DOB: 06-17-1951   PCP: [facility]  Patient is from: Home  DOA: 10/02/2022 LOS: 1  Chief complaints Chief Complaint  Patient presents with   Abdominal Pain    X2 days      Brief Narrative / Interim history: 72 year old F with PMH of A-fib on Eliquis, cholelithiasis, bladder cancer, HTN, CAD and hepatitis presenting with RUQ pain, nausea and vomiting.  Workup including CT abdomen and pelvis and MRCP concerning for acute calculus cholecystitis, hepatic cirrhosis and heterogeneous hepatic steatosis.  General surgery consulted and recommended percutaneous drain but IR feels patient is a poor candidate for percutaneous cholecystostomy due to cirrhosis and ascites with increased risk of bleeding and bacterial peritonitis, and recommended conservative management.  Patient is on IV Zosyn.  General surgery following.  Blood culture with Streptococcus pneumonia.  Subjective: Seen and examined earlier this morning.  No major events overnight of this morning.  She states the pain is better.  Reports some nausea and vomiting.  She also reports UTI symptoms.  She is not a great historian  Objective: Vitals:   10/03/22 0050 10/03/22 0426 10/03/22 0427 10/03/22 0829  BP: (!) 148/98 (!) 118/97  (!) 132/91  Pulse: (!) 124 (!) 111 (!) 106 (!) 55  Resp: (!) '24 20  18  '$ Temp: 99 F (37.2 C) 97.7 F (36.5 C)  97.7 F (36.5 C)  TempSrc: Oral Oral  Oral  SpO2: 100% 98% 99% 100%  Weight:      Height:        Examination:  GENERAL: No apparent distress.  Nontoxic. HEENT: MMM.  Vision and hearing grossly intact.  NECK: Supple.  No apparent JVD.  RESP:  No IWOB.  Fair aeration bilaterally. CVS:  RRR. Heart sounds normal.  ABD/GI/GU: BS+.  Abdomen is very tender in RUQ and across lower abdomen.  Seems to be guarding. MSK/EXT:  Moves extremities. No apparent deformity. No edema.  SKIN: no apparent skin lesion or wound NEURO:  Awake, alert and oriented appropriately.  No apparent focal neuro deficit. PSYCH: Calm. Normal affect.   Procedures:  None  Microbiology summarized: Blood culture with Streptococcus pneumonia  Assessment and plan: Principal Problem:   Acute cholecystitis Active Problems:   Atrial fibrillation with rapid ventricular response (HCC)   Bladder cancer (HCC)   Elevated troponin level not due myocardial infarction   Coronary artery disease involving native coronary artery of native heart without angina pectoris   Severe sepsis (HCC)   Bacteremia due to Streptococcus  Severe sepsis: POA.  Patient had fever, tachycardia, tachypnea and lactic acidosis on presentation.  Due to Streptococcus bacteremia and acute calculus cholecystitis.  Blood culture with Streptococcus pneumoniae.  -Continue IV Zosyn -ID consult  Acute calculus cholecystitis: Imaging including CT and MRI concerning for acute calculus cholecystitis.  -General surgery following and ordered IR PERC chole -IR-poor candidate for perc chole due to cirrhosis and ascites with increased risk of bleeding and bacterial peritonitis -IR recommends conservative management -Continue IV Zosyn -Continue IV fluid -N.p.o. except sips with meds  Paroxysmal atrial fibrillation with RVR: RVR seems to have resolved.  Seems to be on Lopressor 100 mg twice daily at home.  Med rec pending. -IV metoprolol 2.5 mg every 6 hours with holding parameters -Optimize electrolytes -Continue holding anticoagulation until we have clear plan about cholecystitis.  History of CAD: Stable. -Continue home meds   Hypokalemia/hypomagnesemia -Monitor replenish as appropriate  Lactic acidosis: Likely due to severe  sepsis and liver cirrhosis.  Resolved.   History of bladder cancer: Stable.   -Continue per oncology   Body mass index is 24.27 kg/m.          DVT prophylaxis:  SCDs Start: 10/02/22 2313  Code Status: Full code Family Communication: None  at bedside Level of care: Telemetry Medical Status is: Inpatient Remains inpatient appropriate because: Severe sepsis due to Streptococcus bacteremia and calculus cholecystitis   Final disposition: TBD Consultants:  General surgery IR  55 minutes with more than 50% spent in reviewing records, counseling patient/family and coordinating care.   Sch Meds:  Scheduled Meds:  ondansetron (ZOFRAN) IV  4 mg Intravenous Once   Continuous Infusions:  lactated ringers 1,000 mL with potassium chloride 40 mEq infusion 100 mL/hr at 10/03/22 1157   piperacillin-tazobactam (ZOSYN)  IV 3.375 g (10/03/22 1407)   PRN Meds:.metoprolol tartrate, morphine injection, ondansetron **OR** ondansetron (ZOFRAN) IV  Antimicrobials: Anti-infectives (From admission, onward)    Start     Dose/Rate Route Frequency Ordered Stop   10/03/22 0900  cefTRIAXone (ROCEPHIN) 1 g in sodium chloride 0.9 % 100 mL IVPB  Status:  Discontinued        1 g 200 mL/hr over 30 Minutes Intravenous Every 24 hours 10/03/22 0813 10/03/22 0814   10/03/22 0900  metroNIDAZOLE (FLAGYL) IVPB 500 mg  Status:  Discontinued        500 mg 100 mL/hr over 60 Minutes Intravenous Every 12 hours 10/03/22 0813 10/03/22 0814   10/03/22 0600  piperacillin-tazobactam (ZOSYN) IVPB 3.375 g        3.375 g 12.5 mL/hr over 240 Minutes Intravenous Every 8 hours 10/02/22 2310     10/02/22 1915  vancomycin (VANCOCIN) IVPB 1000 mg/200 mL premix        1,000 mg 200 mL/hr over 60 Minutes Intravenous  Once 10/02/22 1911 10/02/22 2154   10/02/22 1900  ceFEPIme (MAXIPIME) 2 g in sodium chloride 0.9 % 100 mL IVPB        2 g 200 mL/hr over 30 Minutes Intravenous  Once 10/02/22 1857 10/02/22 1941   10/02/22 1900  metroNIDAZOLE (FLAGYL) IVPB 500 mg        500 mg 100 mL/hr over 60 Minutes Intravenous  Once 10/02/22 1857 10/02/22 2046        I have personally reviewed the following labs and images: CBC: Recent Labs  Lab 10/02/22 1834 10/03/22 0844  WBC  5.6 10.0  NEUTROABS  --  9.0*  HGB 11.4* 10.7*  HCT 35.4* 32.2*  MCV 108.3* 104.5*  PLT 241 151   BMP &GFR Recent Labs  Lab 10/02/22 1833 10/03/22 0844  NA 136 137  K 3.3* 3.5  CL 105 109  CO2 17* 20*  GLUCOSE 112* 90  BUN 8 9  CREATININE 0.82 0.84  CALCIUM 7.9* 7.4*  MG 1.6* 1.2*  PHOS  --  4.3   Estimated Creatinine Clearance: 46.3 mL/min (by C-G formula based on SCr of 0.84 mg/dL). Liver & Pancreas: Recent Labs  Lab 10/02/22 1833 10/03/22 0844  AST 40 30  ALT 16 13  ALKPHOS 64 40  BILITOT 1.2 2.0*  PROT 7.4 6.0*  ALBUMIN 2.3* 1.9*   Recent Labs  Lab 10/02/22 1833 10/03/22 0844  LIPASE 55* 44   No results for input(s): "AMMONIA" in the last 168 hours. Diabetic: No results for input(s): "HGBA1C" in the last 72 hours. No results for input(s): "GLUCAP" in the last 168 hours. Cardiac Enzymes: No results for input(s): "  CKTOTAL", "CKMB", "CKMBINDEX", "TROPONINI" in the last 168 hours. No results for input(s): "PROBNP" in the last 8760 hours. Coagulation Profile: Recent Labs  Lab 10/02/22 1832 10/03/22 0353  INR 1.7* 1.7*   Thyroid Function Tests: Recent Labs    10/02/22 1833  TSH 8.816*  FREET4 1.53*   Lipid Profile: No results for input(s): "CHOL", "HDL", "LDLCALC", "TRIG", "CHOLHDL", "LDLDIRECT" in the last 72 hours. Anemia Panel: No results for input(s): "VITAMINB12", "FOLATE", "FERRITIN", "TIBC", "IRON", "RETICCTPCT" in the last 72 hours. Urine analysis:    Component Value Date/Time   COLORURINE AMBER (A) 10/02/2022 2005   COLORURINE AMBER (A) 10/02/2022 2005   APPEARANCEUR HAZY (A) 10/02/2022 2005   APPEARANCEUR HAZY (A) 10/02/2022 2005   APPEARANCEUR Clear 07/29/2022 1353   LABSPEC 1.019 10/02/2022 2005   LABSPEC 1.019 10/02/2022 2005   PHURINE 6.0 10/02/2022 2005   PHURINE 6.0 10/02/2022 2005   GLUCOSEU NEGATIVE 10/02/2022 2005   GLUCOSEU NEGATIVE 10/02/2022 2005   HGBUR NEGATIVE 10/02/2022 2005   HGBUR NEGATIVE 10/02/2022 2005    [place] NEGATIVE 10/02/2022 2005   BILIRUBINUR SMALL (A) 10/02/2022 2005   BILIRUBINUR Negative 07/29/2022 [place] 10/02/2022 2005   KETONESUR NEGATIVE 10/02/2022 2005   PROTEINUR 100 (A) 10/02/2022 2005   PROTEINUR 100 (A) 10/02/2022 2005   NITRITE NEGATIVE 10/02/2022 2005   NITRITE NEGATIVE 10/02/2022 2005   LEUKOCYTESUR TRACE (A) 10/02/2022 2005   LEUKOCYTESUR SMALL (A) 10/02/2022 2005   Sepsis Labs: Invalid input(s): "PROCALCITONIN", "LACTICIDVEN"  Microbiology: Recent Results (from the past 240 hour(s))  Blood Culture (routine x 2)     Status: None (Preliminary result)   Collection Time: 10/02/22  6:32 PM   Specimen: BLOOD  Result Value Ref Range Status   Specimen Description BLOOD LEFT ANTECUBITAL  Final   Special Requests   Final    BOTTLES DRAWN AEROBIC AND ANAEROBIC Blood Culture adequate volume   Culture  Setup Time   Final    Organism ID to follow GRAM POSITIVE COCCI AEROBIC BOTTLE ONLY CRITICAL RESULT CALLED TO, READ BACK BY AND VERIFIED WITH: CAROLINE COULTER 10/03/22 1431 AMK Performed at [facility], [address] [place], [place] [address]    Culture GRAM POSITIVE COCCI  Final   Report Status PENDING  Incomplete  Blood Culture ID Panel (Reflexed)     Status: Abnormal   Collection Time: 10/02/22  6:32 PM  Result Value Ref Range Status   Enterococcus faecalis NOT DETECTED NOT DETECTED Final   Enterococcus Faecium NOT DETECTED NOT DETECTED Final   Listeria monocytogenes NOT DETECTED NOT DETECTED Final   Staphylococcus species NOT DETECTED NOT DETECTED Final   Staphylococcus aureus (BCID) NOT DETECTED NOT DETECTED Final   Staphylococcus epidermidis NOT DETECTED NOT DETECTED Final   Staphylococcus lugdunensis NOT DETECTED NOT DETECTED Final   Streptococcus species DETECTED (A) NOT DETECTED Final    Comment: CRITICAL RESULT CALLED TO, READ BACK BY AND VERIFIED WITH: CAROLINE COULTER 10/03/22 1431 AMK    Streptococcus  agalactiae NOT DETECTED NOT DETECTED Final   Streptococcus pneumoniae DETECTED (A) NOT DETECTED Final    Comment: CRITICAL RESULT CALLED TO, READ BACK BY AND VERIFIED WITH: CAROLINE COULTER 10/03/22 1431 AMK    Streptococcus pyogenes NOT DETECTED NOT DETECTED Final   A.calcoaceticus-baumannii NOT DETECTED NOT DETECTED Final   Bacteroides fragilis NOT DETECTED NOT DETECTED Final   Enterobacterales NOT DETECTED NOT DETECTED Final   Enterobacter cloacae complex NOT DETECTED NOT DETECTED Final   Escherichia coli NOT DETECTED NOT  DETECTED Final   Klebsiella aerogenes NOT DETECTED NOT DETECTED Final   Klebsiella oxytoca NOT DETECTED NOT DETECTED Final   Klebsiella pneumoniae NOT DETECTED NOT DETECTED Final   Proteus species NOT DETECTED NOT DETECTED Final   Salmonella species NOT DETECTED NOT DETECTED Final   Serratia marcescens NOT DETECTED NOT DETECTED Final   Haemophilus influenzae NOT DETECTED NOT DETECTED Final   Neisseria meningitidis NOT DETECTED NOT DETECTED Final   Pseudomonas aeruginosa NOT DETECTED NOT DETECTED Final   Stenotrophomonas maltophilia NOT DETECTED NOT DETECTED Final   Candida albicans NOT DETECTED NOT DETECTED Final   Candida auris NOT DETECTED NOT DETECTED Final   Candida glabrata NOT DETECTED NOT DETECTED Final   Candida krusei NOT DETECTED NOT DETECTED Final   Candida parapsilosis NOT DETECTED NOT DETECTED Final   Candida tropicalis NOT DETECTED NOT DETECTED Final   Cryptococcus neoformans/gattii NOT DETECTED NOT DETECTED Final    Comment: Performed at [facility], [address] [place], [place] [address]    Radiology Studies: MR ABDOMEN MRCP W WO CONTAST  Result Date: 10/03/2022 CLINICAL DATA:  72 year old female with suspected biliary obstruction. EXAM: MRI ABDOMEN WITHOUT AND WITH CONTRAST (INCLUDING MRCP) TECHNIQUE: Multiplanar multisequence MR imaging of the abdomen was performed both before and after the administration of intravenous contrast.  Heavily T2-weighted images of the biliary and pancreatic ducts were obtained, and three-dimensional MRCP images were rendered by post processing. CONTRAST:  102m GADAVIST GADOBUTROL 1 MMOL/ML IV SOLN COMPARISON:  Abdominal MRI 04/11/2022. Right upper quadrant abdominal ultrasound 10/03/2022. CT of the abdomen and pelvis 10/02/2022. FINDINGS: Lower chest: Small right pleural effusion lying dependently. Hepatobiliary: Diffuse but heterogeneous loss of signal intensity throughout the hepatic parenchyma on out of phase dual echo images, indicative of a background of heterogeneous hepatic steatosis. Liver has a shrunken appearance and very nodular contour, indicative of underlying cirrhosis. No definite suspicious cystic or solid hepatic lesions are confidently identified. No intra or extrahepatic biliary ductal dilatation noted on MRCP images. Common bile duct measures 6 mm in the porta hepatis. No filling defect within the common bile duct to suggest choledocholithiasis. However, there is heterogeneous signal intensity within the lumen of the gallbladder, most notable for a large signal void which measures up to 4.5 x 3.0 cm, compatible with a large stone. Gallbladder wall is irregularly thickened and edematous measuring up to 1 cm, with surrounding pericholecystic fluid and inflammatory changes. Pancreas: No pancreatic mass. No pancreatic ductal dilatation. No pancreatic or peripancreatic fluid collections or inflammatory changes. Spleen:  Unremarkable. Adrenals/Urinary Tract: Bilateral kidneys and bilateral adrenal glands are normal in appearance. No hydroureteronephrosis in the visualized portions of the abdomen. Stomach/Bowel: Visualized portions are unremarkable. Vascular/Lymphatic: No aneurysm identified in the visualized abdominal vasculature. No lymphadenopathy noted in the abdomen. Other: Trace volume of ascites. Inflammatory changes centered around the gallbladder in the right upper quadrant of the abdomen.  Incidental imaging of the pelvis demonstrates an enlarged uterus with multiple lesions, presumably fibroids. Musculoskeletal: No aggressive appearing osseous lesions are noted in the visualized portions of the skeleton. IMPRESSION: 1. Cholelithiasis with imaging findings highly concerning for acute cholecystitis. Surgical consultation is recommended. 2. No choledocholithiasis or findings of biliary tract obstruction. 3. Hepatic cirrhosis and heterogeneous hepatic steatosis. No aggressive appearing hepatic lesion noted at this time. 4. Trace volume of ascites. 5. Small right pleural effusion lying dependently. Electronically Signed   By: DVinnie LangtonM.D.   On: 10/03/2022 05:55   MR 3D Recon At Scanner  Result Date:  10/03/2022 CLINICAL DATA:  72 year old female with suspected biliary obstruction. EXAM: MRI ABDOMEN WITHOUT AND WITH CONTRAST (INCLUDING MRCP) TECHNIQUE: Multiplanar multisequence MR imaging of the abdomen was performed both before and after the administration of intravenous contrast. Heavily T2-weighted images of the biliary and pancreatic ducts were obtained, and three-dimensional MRCP images were rendered by post processing. CONTRAST:  68m GADAVIST GADOBUTROL 1 MMOL/ML IV SOLN COMPARISON:  Abdominal MRI 04/11/2022. Right upper quadrant abdominal ultrasound 10/03/2022. CT of the abdomen and pelvis 10/02/2022. FINDINGS: Lower chest: Small right pleural effusion lying dependently. Hepatobiliary: Diffuse but heterogeneous loss of signal intensity throughout the hepatic parenchyma on out of phase dual echo images, indicative of a background of heterogeneous hepatic steatosis. Liver has a shrunken appearance and very nodular contour, indicative of underlying cirrhosis. No definite suspicious cystic or solid hepatic lesions are confidently identified. No intra or extrahepatic biliary ductal dilatation noted on MRCP images. Common bile duct measures 6 mm in the porta hepatis. No filling defect within  the common bile duct to suggest choledocholithiasis. However, there is heterogeneous signal intensity within the lumen of the gallbladder, most notable for a large signal void which measures up to 4.5 x 3.0 cm, compatible with a large stone. Gallbladder wall is irregularly thickened and edematous measuring up to 1 cm, with surrounding pericholecystic fluid and inflammatory changes. Pancreas: No pancreatic mass. No pancreatic ductal dilatation. No pancreatic or peripancreatic fluid collections or inflammatory changes. Spleen:  Unremarkable. Adrenals/Urinary Tract: Bilateral kidneys and bilateral adrenal glands are normal in appearance. No hydroureteronephrosis in the visualized portions of the abdomen. Stomach/Bowel: Visualized portions are unremarkable. Vascular/Lymphatic: No aneurysm identified in the visualized abdominal vasculature. No lymphadenopathy noted in the abdomen. Other: Trace volume of ascites. Inflammatory changes centered around the gallbladder in the right upper quadrant of the abdomen. Incidental imaging of the pelvis demonstrates an enlarged uterus with multiple lesions, presumably fibroids. Musculoskeletal: No aggressive appearing osseous lesions are noted in the visualized portions of the skeleton. IMPRESSION: 1. Cholelithiasis with imaging findings highly concerning for acute cholecystitis. Surgical consultation is recommended. 2. No choledocholithiasis or findings of biliary tract obstruction. 3. Hepatic cirrhosis and heterogeneous hepatic steatosis. No aggressive appearing hepatic lesion noted at this time. 4. Trace volume of ascites. 5. Small right pleural effusion lying dependently. Electronically Signed   By: DVinnie LangtonM.D.   On: 10/03/2022 05:55   [place]DOM[place] LIMITED RUQ (LIVER/GB)  Result Date: 10/03/2022 CLINICAL DATA:  Abdominal pain. EXAM: ULTRASOUND ABDOMEN LIMITED RIGHT UPPER QUADRANT COMPARISON:  April 11, 2022 FINDINGS: Gallbladder: Large echogenic gallstones are seen  within the lumen of a distended gallbladder (the largest measures approximately 3.0 cm). This is seen on the prior study. Stable gallbladder wall thickening (5.4 mm) and pericholecystic fluid are also noted. The presence or absence of a sonographic MPercell Millersign was not provided by the sonographer. Common bile duct: Diameter: 10.1 mm (measured 9.2 mm on the prior study) Liver: A 1.0 cm x 1.1 cm x 1.0 cm hypoechoic area is seen within the left lobe of the liver. No flow is noted within this region on color Doppler evaluation. The liver parenchyma is nodular in contour and diffusely increased in echogenicity. Portal vein is patent on color Doppler imaging with normal direction of blood flow towards the liver. Other: There is a mild to moderate amount of perihepatic fluid. IMPRESSION: 1. Cholelithiasis and stable gallbladder wall thickening, which may be secondary to ascites. Sequelae associated with acute cholecystitis cannot be excluded. 2. Hepatic steatosis and hepatic cirrhosis  with additional findings that may represent a hepatic cyst within the left lobe. 3. Ascites. Electronically Signed   By: Virgina Norfolk M.D.   On: 10/03/2022 00:08   CT CHEST ABDOMEN PELVIS W CONTRAST  Result Date: 10/02/2022 CLINICAL DATA:  Sepsis, abdominal pain out of proportion, lactic acidosis. Bladder cancer. EXAM: CT CHEST, ABDOMEN, AND PELVIS WITH CONTRAST TECHNIQUE: Multidetector CT imaging of the chest, abdomen and pelvis was performed following the standard protocol during bolus administration of intravenous contrast. RADIATION DOSE REDUCTION: This exam was performed according to the departmental dose-optimization program which includes automated exposure control, adjustment of the mA and/or kV according to patient size and/or use of iterative reconstruction technique. CONTRAST:  182m OMNIPAQUE IOHEXOL 300 MG/ML  SOLN COMPARISON:  PET CT 08/26/2022, CT abdomen pelvis 04/11/2022 FINDINGS: CT CHEST FINDINGS Cardiovascular:  Mild coronary artery calcification. Extensive calcification of the mitral valve annulus. Global cardiac size within normal limits. No pericardial effusion. Central pulmonary arteries are enlarged in keeping with changes of pulmonary arterial hypertension. Moderate atherosclerotic calcification within the thoracic aorta. No aortic aneurysm. Mediastinum/Nodes: Pathologic right paratracheal lymph node demonstrating hypermetabolism on prior PET CT examination is stable measuring 15 mm in short axis diameter at axial image # 17/2. No new pathologic thoracic adenopathy. Esophagus unremarkable. Visualized thyroid unremarkable. Lungs/Pleura: Interval development of small right pleural effusion. Interval development of trace interstitial pulmonary edema, possibly cardiogenic in nature. No pneumothorax. No central obstructing lesion. 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 Stable subacute fractures of the superior endplate of [id number] and the medial right eleventh and twelfth ribs proximal to the costotransverse junction demonstrating callus is again noted. No acute bone abnormality. No focal lytic lesion identified. CT ABDOMEN PELVIS FINDINGS Hepatobiliary: Cholelithiasis again noted. The gallbladder is distended and a gallstone is seen impacted within the gallbladder neck, similar to prior examination. There has, however, developed increasing pericholecystic infiltration which may relate to inflammatory changes the gallbladder, as can be seen with calculus cholecystitis, or progressive ascites. Cirrhosis. No enhancing intrahepatic mass. No intra or extrahepatic biliary ductal dilation. Pancreas: Unremarkable Spleen: Unremarkable Adrenals/Urinary Tract: The adrenal glands are unremarkable. The kidneys are normal in size and position. Mild left hydronephrosis and hydroureter to the level of the left ureterovesicular junction is stable since prior PET CT  examination. No hydronephrosis on the right. No intrarenal or ureteral calculi. No enhancing intrarenal masses. The bladder is unremarkable. Stomach/Bowel: There is subtle asymmetric bowel wall thickening involving the hepatic flexure of the colon adjacent to the gallbladder suggesting an adjacent inflammatory process. The stomach, small bowel, and large bowel are otherwise unremarkable. Appendix normal. Mild ascites is present, new since prior PET CT examination. No free intraperitoneal gas. Vascular/Lymphatic: Extensive aortoiliac atherosclerotic calcification. No aortic aneurysm. Stable left periaortic adenopathy since prior PET CT examination where this demonstrate no significant metabolic activity and likely represents the residua of treated disease. No new pathologic adenopathy within the abdomen and pelvis. Reproductive: Multiple hypoenhancing masses are again seen within the uterus most in keeping with multiple uterine fibroids. The pelvic organs are otherwise unremarkable. Other: Increasing diffuse subcutaneous body wall edema in keeping with progressive anasarca. No abdominal wall hernia. Musculoskeletal: Degenerative changes are seen within the lumbar spine. No acute bone abnormality. No lytic bone lesion. IMPRESSION: 1. Interval development of small right pleural effusion, trace interstitial pulmonary edema, mild ascites, and progressive diffuse subcutaneous body wall edema in keeping with progressive anasarca and/or cardiogenic failure. 2. Cholelithiasis with gallstone impacted within  the gallbladder neck. Interval development of pericholecystic infiltration which may relate to inflammatory changes the gallbladder, as can be seen with calculus cholecystitis, or progressive ascites. Subtle inflammatory change involving the adjacent hepatic flexure of the colon, however, suggests acute cholecystitis. Correlation with liver enzymes and possible right upper quadrant sonography may be helpful for further  evaluation. 3. Cirrhosis. 4. Stable mild left hydronephrosis and hydroureter to the level of the left ureterovesicular junction, possibly related to the patient's known underlying bladder malignancy. No mass lesion identified, however. 5. Stable subacute fractures of the superior endplate of 624THL anteriorly and the medial right eleventh and twelfth ribs proximal to the costotransverse junction. No acute bone abnormality identified. 6. Mild coronary artery calcification. 7.  Aortic Atherosclerosis (ICD10-I70.0). Electronically Signed   By: Fidela Salisbury M.D.   On: 10/02/2022 21:53   DG Chest Port 1 View  Result Date: 10/02/2022 CLINICAL DATA:  Sepsis EXAM: PORTABLE CHEST 1 VIEW COMPARISON:  04/11/2022 FINDINGS: Lungs are well expanded, symmetric, and clear. No pneumothorax or pleural effusion. Cardiac size within normal limits. Extensive mitral valve annular calcifications again noted. Pulmonary vascularity is normal. Osseous structures are age-appropriate. Advanced degenerative changes noted within the shoulders bilaterally. No acute bone abnormality. IMPRESSION: 1. No active disease. Electronically Signed   By: Fidela Salisbury M.D.   On: 10/02/2022 19:38      Kyanna Mahrt T. [place]  If 7PM-7AM, please contact night-coverage [email or web address] 10/03/2022, 2:46 PM

## 2022-10-03 NOTE — Consult Note (Signed)
Patient ID: Kristina Ellis, female   DOB: [birth year], 72 y.o.   MRN: [id number]  HPI Kristina Ellis is a 72 y.o. female in consultation at the request of Dr. Jacelyn Grip.  She does have ahistory of stage IV bladder CA has a past medical history of Aortic atherosclerosis (HCC), Arthritis, Atrial fibrillation  on anticoagulation, Diastolic dysfunction, Hepatic cirrhosis ([place]), Hepatic steatosis, Hepatitis . She has had episodes of cholecystitis and choledocho requiring ERCP 2023. SHe has refused cholecystectomy in the past. Apparently was in hospice at some point in time but since started immunotherapy she has had good response to metastatic disease,  she still has some bony residual disease Ct pers reviewed c/w cholecystitis  , liver cirrhosis and anasarca. He also had an MRCP showing no evidence of cholelithiasis but significant for cirrhosis cholecystitis Came in yesterday with severe abdominal pain located in the right upper quadrant pain is moderate but the patient minimizes the pain.  Is sharp.  Patient has been present for 2 days or so.  She also reports decreased appetite. As I stated before she has continued to decline cholecystectomy She is at least  CHild B   HPI  Past Medical History:  Diagnosis Date   Aortic atherosclerosis ([place])    Arthritis    Atrial fibrillation ([place])    a.) s/p TEE with cardioversion on 02/04/2020. b.) on daily apixaban.   Bladder mass    a.) CT 04/08/2021 --> 3.2 cm intraluminal bladder mass.   Carotid stenosis, bilateral    a.) Doppler 07/04/2020 --> mild; 1-49% stenosis BILATERALLY.   Cholelithiasis    a.) CT 11/06/2020 --> largest measured 4.5 cm   Chronic anticoagulation    a.) Apixaban   Common biliary duct calculus    a.) CT 04/08/2021 --> CBD dilated at 8 mm; 46m calculus within distal CBD.   Coronary artery disease involving native coronary artery of native heart without angina pectoris [id number]  Diastolic dysfunction    a.) TTE 07/04/2020 --> LVEF  60-65%; G1DD.   Hepatic cirrhosis (HCC)    Hepatic steatosis    Hepatitis 1970   History of 2019 novel coronavirus disease (COVID-19) 12/20/2019   History of marijuana use    Hypertension    Hypertensive retinopathy    IDA (iron deficiency anemia)    Mitral stenosis    a.) TEE 04/10/2020 --> mild. b.) TTE 07/04/2020 --> EF 60-65%; mild (mean gradient 5 mmHg).   Moderate pulmonary arterial systolic hypertension ([place] 12/22/2019   Nephrolithiasis    NSTEMI (non-ST elevated myocardial infarction) [place] 07/26/2016   Open-angle glaucoma 09/11/2010   PAH (pulmonary artery hypertension) ([place]    a.) TTE 12/21/2019 --> PASP 44 mmHg.   Uterine fibroid    a.) CT 04/08/2021 --> multiple with largest measuring 7 cm.   Valvular regurgitation    a.) TTE 09/01/2010 --> trivial to mild pan-valvular. b.) TTE 12/21/2019 --> mild MR and AR; moderate TR. c.) TTE 07/04/2020 --> mild TR; trivial MR and PR.   Vestibular neuronitis     Past Surgical History:  Procedure Laterality Date   BREAST CYST ASPIRATION Left    COLONOSCOPY  2012   ERCP N/A 12/18/2021   Procedure: ENDOSCOPIC RETROGRADE CHOLANGIOPANCREATOGRAPHY (ERCP);  Surgeon: WLucilla Lame MD;  [facility];  Service: Endoscopy;  Laterality: N/A;   TEE WITH CARDIOVERSION N/A 02/04/2020   Procedure: TEE WITH CARDIOVERSION; Location: [facility]; Surgeon: CKandis Cocking MD   TRANSURETHRAL RESECTION OF BLADDER TUMOR N/A 05/15/2021   Procedure: TRANSURETHRAL  RESECTION OF BLADDER TUMOR (TURBT);  Surgeon: Billey Co, MD;  Location: [facility];  Service: Urology;  Laterality: N/A;    Family History  Problem Relation Age of Onset   Aneurysm Mother    Colon cancer Father    Lung cancer Brother    Breast cancer Neg Hx     Social History Social History   Tobacco Use   Smoking status: Former    Packs/day: 0.10    Years: 10.00    Total pack years: 1.00    Types: Cigarettes    Passive exposure: Past   Smokeless tobacco: Never   Tobacco  comments:    occasional smoker  Vaping Use   Vaping Use: Never used  Substance Use Topics   Alcohol use: Yes    Alcohol/week: 5.0 standard drinks of alcohol    Types: 5 Cans of beer per week    Comment: weekly   Drug use: Not Currently    Types: 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, 70's    Allergies  Allergen Reactions   Atenolol Other (See Comments)    bradycardia bradycardia    Penicillins Rash    Current Facility-Administered Medications  Medication Dose Route Frequency Provider Last Rate Last Admin   lactated ringers infusion   Intravenous Continuous Gala Romney L, MD 100 mL/hr at 10/03/22 0600 Infusion Verify at 10/03/22 0600   metoprolol tartrate (LOPRESSOR) injection 10 mg  10 mg Intravenous Q6H PRN Elwyn Reach, MD       morphine (PF) 2 MG/ML injection 2 mg  2 mg Intravenous Q2H PRN Elwyn Reach, MD       ondansetron (ZOFRAN) injection 4 mg  4 mg Intravenous Once Lucillie Garfinkel, MD       ondansetron [facility]) tablet 4 mg  4 mg Oral Q6H PRN Elwyn Reach, MD       Or   ondansetron (ZOFRAN) injection 4 mg  4 mg Intravenous Q6H PRN Elwyn Reach, MD       phytonadione (VITAMIN K) 5 mg in dextrose 5 % 50 mL IVPB  5 mg Intravenous Once Keala Drum F, MD       piperacillin-tazobactam (ZOSYN) IVPB 3.375 g  3.375 g Intravenous Q8H Renda Rolls, RPH 12.5 mL/hr at 10/03/22 0600 Infusion Verify at 10/03/22 0600   Facility-Administered Medications Ordered in Other Encounters  Medication Dose Route Frequency Provider Last Rate Last Admin   heparin lock flush 100 unit/mL  500 Units Intracatheter Once PRN Lloyd Huger, MD       sodium chloride flush (NS) 0.9 % injection 10 mL  10 mL Intracatheter PRN Lloyd Huger, MD         Review of Systems Full ROS  was asked and was negative except for the information on the HPI  Physical Exam Blood pressure (!) 132/91, pulse (!) 55, temperature 97.7 F (36.5 C), temperature source Oral, resp. rate 18,  height '4\' 11"'$  (1.499 m), weight 54.5 kg, SpO2 100 %. CONSTITUTIONAL: Chronically ill and malnourished. EYES: Pupils are equal, round,  Sclera are non-icteric. EARS, NOSE, MOUTH AND THROAT: The oropharynx is clear.  Hearing is intact to voice. LYMPH NODES:  Lymph nodes in the neck are normal. RESPIRATORY:  Lungs are clear. There is normal respiratory effort, with equal breath sounds bilaterally, and without pathologic use of accessory muscles. CARDIOVASCULAR: Heart is regular without murmurs, gallops, or rubs. GI: The abdomen is  soft, ender to palpation with clearly positive  Murphy sign  GU: Rectal deferred.   MUSCULOSKELETAL: Normal muscle strength and tone. No cyanosis or edema.   SKIN: Turgor is good and there are no pathologic skin lesions or ulcers. NEUROLOGIC: Motor and sensation is grossly normal. Cranial nerves are grossly intact. PSYCH:  Oriented to person, place and time. Affect is normal.  Data Reviewed  I have personally reviewed the patient's imaging, laboratory findings and medical records.    Assessment/Plan 72 yo -year-old female with metastatic bladder cancer now with control of disease after Keytruda.  She does have acute cholecystitis  in the setting of cirrhosis and ascites, definitely needs treatment with antibiotic.  We had an extensive discussion about percutaneous cholecystostomy tube.  I definitely think that she needs it but currently the patient does not wish to have any procedures at this time as she is afraid that her cancer will metastasize if she had any surgical intervention or procedures. I have discussed with her in detail explained that a cholecystostomy tube would not be close to her bladder and probably will be in her best interest.  I have also discussed with Dr. Laurence Ferrari given her cirrhosis and ascites the will not want to submit her to this risk.  I want to emphasize that if she were not to respond to antibiotics there is little option but to bite the  bullet and perform a cholecystostomy tube. Will continue to follow her.  She is still refusing cholecystectomy and I honestly do not think that cholecystectomy would be in her best interest at this time Please note that I spent 75 minutes in this encounter including personally reviewing imaging studies, coordinating his care, placing orders and performing a proper documentation.     Caroleen Hamman, MD FACS General Surgeon 10/03/2022, 9:59 AM

## 2022-10-03 NOTE — Consult Note (Signed)
PHARMACY - PHYSICIAN COMMUNICATION CRITICAL VALUE ALERT - BLOOD CULTURE IDENTIFICATION (BCID)  Kristina Ellis is an 72 y.o. female who presented to [facility] on 10/02/2022 with a chief complaint of cholecystitis.   Assessment: 1 out of 4 bottles growing GPC. BCID detects Strep. pneumoniae without resistance detected.  Name of physician (or Provider) Contacted: Wendee Beavers, MD  Current antibiotics: Zosyn  Changes to prescribed antibiotics recommended: No changes needed, current antibiotics should cover. Patient is on recommended antibiotics - No changes needed  Results for orders placed or performed during the hospital encounter of 10/02/22  Blood Culture ID Panel (Reflexed) (Collected: 10/02/2022  6:32 PM)  Result Value Ref Range   Enterococcus faecalis NOT DETECTED NOT DETECTED   Enterococcus Faecium NOT DETECTED NOT DETECTED   Listeria monocytogenes NOT DETECTED NOT DETECTED   Staphylococcus species NOT DETECTED NOT DETECTED   Staphylococcus aureus (BCID) NOT DETECTED NOT DETECTED   Staphylococcus epidermidis NOT DETECTED NOT DETECTED   Staphylococcus lugdunensis NOT DETECTED NOT DETECTED   Streptococcus species DETECTED (A) NOT DETECTED   Streptococcus agalactiae NOT DETECTED NOT DETECTED   Streptococcus pneumoniae DETECTED (A) NOT DETECTED   Streptococcus pyogenes NOT DETECTED NOT DETECTED   A.calcoaceticus-baumannii NOT DETECTED NOT DETECTED   Bacteroides fragilis NOT DETECTED NOT DETECTED   Enterobacterales NOT DETECTED NOT DETECTED   Enterobacter cloacae complex NOT DETECTED NOT DETECTED   Escherichia coli NOT DETECTED NOT DETECTED   Klebsiella aerogenes NOT DETECTED NOT DETECTED   Klebsiella oxytoca NOT DETECTED NOT DETECTED   Klebsiella pneumoniae NOT DETECTED NOT DETECTED   Proteus species NOT DETECTED NOT DETECTED   Salmonella species NOT DETECTED NOT DETECTED   Serratia marcescens NOT DETECTED NOT DETECTED   Haemophilus influenzae NOT DETECTED NOT DETECTED    Neisseria meningitidis NOT DETECTED NOT DETECTED   Pseudomonas aeruginosa NOT DETECTED NOT DETECTED   Stenotrophomonas maltophilia NOT DETECTED NOT DETECTED   Candida albicans NOT DETECTED NOT DETECTED   Candida auris NOT DETECTED NOT DETECTED   Candida glabrata NOT DETECTED NOT DETECTED   Candida krusei NOT DETECTED NOT DETECTED   Candida parapsilosis NOT DETECTED NOT DETECTED   Candida tropicalis NOT DETECTED NOT DETECTED   Cryptococcus neoformans/gattii NOT DETECTED NOT DETECTED   Gretel Acre, PharmD PGY1 Pharmacy Resident 10/03/2022 2:43 PM

## 2022-10-03 NOTE — Progress Notes (Signed)
IR was requested for image guided perc chole placement.   Case was reviewed by Dr. Laurence Ferrari, patient is a poor candidate for perc chole placement due to cirrhosis and ascites which increase risk of bleeding and bacterial peritonitis.  Recommends conservative measurements.    Ordering provider notified.   No plan for perc chole at the moment.  Please call IR for questions and concerns.   Armando Gang Vallerie Hentz PA-C 10/03/2022 9:22 AM

## 2022-10-04 ENCOUNTER — Inpatient Hospital Stay (HOSPITAL_COMMUNITY)
Admit: 2022-10-04 | Discharge: 2022-10-04 | Disposition: A | Discharge status: Home / Self Care | Payer: 59 | Attending: Student | Admitting: Student

## 2022-10-04 ENCOUNTER — Inpatient Hospital Stay: Payer: 59

## 2022-10-04 DIAGNOSIS — C679 Malignant neoplasm of bladder, unspecified: Secondary | ICD-10-CM | POA: Diagnosis not present

## 2022-10-04 DIAGNOSIS — B953 Streptococcus pneumoniae as the cause of diseases classified elsewhere: Secondary | ICD-10-CM

## 2022-10-04 DIAGNOSIS — R7881 Bacteremia: Secondary | ICD-10-CM | POA: Diagnosis not present

## 2022-10-04 DIAGNOSIS — C670 Malignant neoplasm of trigone of bladder: Secondary | ICD-10-CM | POA: Diagnosis not present

## 2022-10-04 DIAGNOSIS — K819 Cholecystitis, unspecified: Secondary | ICD-10-CM | POA: Diagnosis not present

## 2022-10-04 DIAGNOSIS — I4891 Unspecified atrial fibrillation: Secondary | ICD-10-CM | POA: Diagnosis not present

## 2022-10-04 DIAGNOSIS — E876 Hypokalemia: Secondary | ICD-10-CM | POA: Diagnosis not present

## 2022-10-04 DIAGNOSIS — K810 Acute cholecystitis: Secondary | ICD-10-CM | POA: Diagnosis not present

## 2022-10-04 LAB — CBC WITH DIFFERENTIAL/PLATELET
Abs Immature Granulocytes: 0.06 10*3/uL (ref 0.00–0.07)
Basophils Absolute: 0 10*3/uL (ref 0.0–0.1)
Basophils Relative: 0 %
Eosinophils Absolute: 0 10*3/uL (ref 0.0–0.5)
Eosinophils Relative: 0 %
HCT: 30.2 % — ABNORMAL LOW (ref 36.0–46.0)
Hemoglobin: 10.2 g/dL — ABNORMAL LOW (ref 12.0–15.0)
Immature Granulocytes: 1 %
Lymphocytes Relative: 4 %
Lymphs Abs: 0.3 10*3/uL — ABNORMAL LOW (ref 0.7–4.0)
MCH: 35.4 pg — ABNORMAL HIGH (ref 26.0–34.0)
MCHC: 33.8 g/dL (ref 30.0–36.0)
MCV: 104.9 fL — ABNORMAL HIGH (ref 80.0–100.0)
Monocytes Absolute: 0.4 10*3/uL (ref 0.1–1.0)
Monocytes Relative: 4 %
Neutro Abs: 8.3 10*3/uL — ABNORMAL HIGH (ref 1.7–7.7)
Neutrophils Relative %: 91 %
Platelets: 175 10*3/uL (ref 150–400)
RBC: 2.88 MIL/uL — ABNORMAL LOW (ref 3.87–5.11)
RDW: 14.4 % (ref 11.5–15.5)
WBC: 9.1 10*3/uL (ref 4.0–10.5)
nRBC: 0 % (ref 0.0–0.2)

## 2022-10-04 LAB — ECHOCARDIOGRAM COMPLETE
AR max vel: 1.71 cm2
AV Area VTI: 1.43 cm2
AV Area mean vel: 1.53 cm2
AV Mean grad: 6 mmHg
AV Peak grad: 12.8 mmHg
Ao pk vel: 1.79 m/s
Area-P 1/2: 2.91 cm2
Height: 59 in
MV VTI: 1.68 cm2
S' Lateral: 2.7 cm
Weight: 1922.41 oz

## 2022-10-04 LAB — COMPREHENSIVE METABOLIC PANEL
ALT: 10 U/L (ref 0–44)
AST: 27 U/L (ref 15–41)
Albumin: 1.6 g/dL — ABNORMAL LOW (ref 3.5–5.0)
Alkaline Phosphatase: 39 U/L (ref 38–126)
Anion gap: 8 (ref 5–15)
BUN: 12 mg/dL (ref 8–23)
CO2: 19 mmol/L — ABNORMAL LOW (ref 22–32)
Calcium: 7.3 mg/dL — ABNORMAL LOW (ref 8.9–10.3)
Chloride: 108 mmol/L (ref 98–111)
Creatinine, Ser: 0.89 mg/dL (ref 0.44–1.00)
GFR, Estimated: >60 mL/min (ref >60)
Glucose, Bld: 97 mg/dL (ref 70–99)
Potassium: 3.8 mmol/L (ref 3.5–5.1)
Sodium: 135 mmol/L (ref 135–145)
Total Bilirubin: 1.5 mg/dL — ABNORMAL HIGH (ref 0.3–1.2)
Total Protein: 5.6 g/dL — ABNORMAL LOW (ref 6.5–8.1)

## 2022-10-04 LAB — PROTIME-INR
INR: 1.6 — ABNORMAL HIGH (ref 0.8–1.2)
Prothrombin Time: 19.2 seconds — ABNORMAL HIGH (ref 11.4–15.2)

## 2022-10-04 LAB — PHOSPHORUS: Phosphorus: 3.5 mg/dL (ref 2.5–4.6)

## 2022-10-04 LAB — BRAIN NATRIURETIC PEPTIDE: B Natriuretic Peptide: 285.4 pg/mL — ABNORMAL HIGH (ref 0.0–100.0)

## 2022-10-04 LAB — MAGNESIUM: Magnesium: 2.2 mg/dL (ref 1.7–2.4)

## 2022-10-04 MED ORDER — MIDODRINE HCL 5 MG PO TABS
5.0000 mg | ORAL_TABLET | Freq: Three times a day (TID) | ORAL | Status: DC
  Administered 2022-10-04 – 2022-10-12 (×23): 5 mg via ORAL
  Filled 2022-10-04 (×24): qty 1

## 2022-10-04 MED ORDER — APIXABAN 5 MG PO TABS
5.0000 mg | ORAL_TABLET | Freq: Two times a day (BID) | ORAL | Status: DC
  Administered 2022-10-04 (×2): 5 mg via ORAL
  Filled 2022-10-04 (×2): qty 1

## 2022-10-04 MED ORDER — METOPROLOL TARTRATE 5 MG/5ML IV SOLN: 2.5000 mg | Freq: Four times a day (QID) | INTRAVENOUS | Status: DC

## 2022-10-04 MED ORDER — AMIODARONE HCL IN DEXTROSE 360-4.14 MG/200ML-% IV SOLN
60.0000 mg/h | INTRAVENOUS | Status: DC
  Administered 2022-10-04: 60 mg/h via INTRAVENOUS
  Filled 2022-10-04 (×2): qty 200

## 2022-10-04 MED ORDER — METOPROLOL TARTRATE 25 MG PO TABS
25.0000 mg | ORAL_TABLET | Freq: Two times a day (BID) | ORAL | Status: DC
  Administered 2022-10-04: 25 mg via ORAL
  Filled 2022-10-04: qty 1

## 2022-10-04 MED ORDER — AMIODARONE LOAD VIA INFUSION
150.0000 mg | Freq: Once | INTRAVENOUS | Status: AC
  Administered 2022-10-04: 150 mg via INTRAVENOUS
  Filled 2022-10-04: qty 83.34

## 2022-10-04 MED ORDER — METOPROLOL TARTRATE 5 MG/5ML IV SOLN
2.5000 mg | INTRAVENOUS | Status: DC | PRN
  Administered 2022-10-04 (×2): 2.5 mg via INTRAVENOUS
  Filled 2022-10-04 (×2): qty 5

## 2022-10-04 MED ORDER — AMIODARONE HCL 200 MG PO TABS
200.0000 mg | ORAL_TABLET | Freq: Every day | ORAL | Status: DC
  Administered 2022-10-04: 200 mg via ORAL
  Filled 2022-10-04: qty 1

## 2022-10-04 MED ORDER — AMIODARONE HCL IN DEXTROSE 360-4.14 MG/200ML-% IV SOLN
30.0000 mg/h | INTRAVENOUS | Status: DC
  Administered 2022-10-05 – 2022-10-06 (×4): 30 mg/h via INTRAVENOUS
  Filled 2022-10-04 (×4): qty 200

## 2022-10-04 NOTE — Progress Notes (Addendum)
[facility] SURGICAL PROGRESS NOTE (cpt 917 [id number])  Hospital Day(s): 2.   Interval History: Patient seen and examined, no acute events or new complaints overnight. Patient reports she continues to have relatively diffuse pain throughout her abdomen but reports the RLQ and LLQ are the worse. No fever, chills, nausea, emesis. She is without leukocytosis; WBC 9.1K. Hgb stable at 10.2. Renal function normal; sCr - 0.89; UO - 600 ccs. No electrolyte derangements. Mild hyperbilirubinemia; now improving at 1.5 (from 2.0). INR 1.6. She is on Zosyn. She is on CLD  Review of Systems:  Constitutional: denies fever, chills  HEENT: denies cough or congestion  Respiratory: denies any shortness of breath  Cardiovascular: denies chest pain or palpitations  Gastrointestinal: + abdominal pain (unchanged), denied N/V Genitourinary: denies burning with urination or urinary frequency Musculoskeletal: denies pain, decreased motor or sensation  Vital signs in last 24 hours: [min-max] current  Temp:  [97.6 F (36.4 C)-97.9 F (36.6 C)] 97.9 F (36.6 C) (02/26 0436) Pulse Rate:  [55-109] 62 (02/26 0436) Resp:  [16-20] 20 (02/26 0436) BP: (106-132)/(76-100) 106/76 (02/26 0436) SpO2:  [94 %-100 %] 94 % (02/26 0436)     Height: '4\' 11"'$  (149.9 cm) Weight: 54.5 kg BMI (Calculated): 24.25   Intake/Output last 2 shifts:  02/25 0701 - 02/26 0700 In: 2565 [P.O.:720; I.V.:1595; IV Piggyback:250] Out: 750 [Urine:600; Emesis/NG output:150]   Physical Exam:  Constitutional: alert, cooperative and no distress  HENT: normocephalic without obvious abnormality  Eyes: PERRL, EOM's grossly intact and symmetric  Respiratory: breathing non-labored at rest  Cardiovascular: regular rate and sinus rhythm  Gastrointestinal: Soft, she is more diffusely tender but point to her right and left lower quadrants as worst pain, non-distended. She is difficult to read but she does have marked tenderness Musculoskeletal: no  edema or wounds, motor and sensation grossly intact, NT    Labs:     Latest Ref Rng & Units 10/04/2022    4:49 AM 10/03/2022    8:44 AM 10/02/2022    6:34 PM  CBC  WBC 4.0 - 10.5 K/uL 9.1  10.0  5.6   Hemoglobin 12.0 - 15.0 g/dL 10.2  10.7  11.4   Hematocrit 36.0 - 46.0 % 30.2  32.2  35.4   Platelets 150 - 400 K/uL 175  151  241       Latest Ref Rng & Units 10/04/2022    4:49 AM 10/03/2022    8:44 AM 10/02/2022    6:33 PM  CMP  Glucose 70 - 99 mg/dL 97  90  112   BUN 8 - 23 mg/dL '12  9  8   '$ Creatinine 0.44 - 1.00 mg/dL 0.89  0.84  0.82   Sodium 135 - 145 mmol/L 135  137  136   Potassium 3.5 - 5.1 mmol/L 3.8  3.5  3.3   Chloride 98 - 111 mmol/L 108  109  105   CO2 22 - 32 mmol/L '19  20  17   '$ Calcium 8.9 - 10.3 mg/dL 7.3  7.4  7.9   Total Protein 6.5 - 8.1 g/dL 5.6  6.0  7.4   Total Bilirubin 0.3 - 1.2 mg/dL 1.5  2.0  1.2   Alkaline Phos 38 - 126 U/L 39  40  64   AST 15 - 41 U/L 27  30  40   ALT 0 - 44 U/L '10  13  16      '$ Imaging studies: No new pertinent imaging studies  Assessment/Plan: (ICD-10's: K81.0) 72 y.o. female with acute cholecystitis in setting of cirrhosis Ardine Eng B) and metastatic bladder cancer   - Although she is without leukocytosis nor fever, she continues to have abdominal tenderness and exam is somewhat concerning. I again had discussion with her regarding recommendation for percutaneous cholecystostomy tube placement, and she continues to refuse procedures until we "talk to bladder cancer doctors." Case was discussed with IR who also felt she was a poor candidate given her cirrhosis and ascites; however, I do think she needs this given severity of cholecystitis and lack of improvement with Abx alone. She does not want cholecystectomy and she is certainly a sub-optimal candidate given her comorbidities as mentioned. She is Ardine Eng B (30% peri-operative mortality).    - Would not advance past CLD right now  - Continue IV Abx (Zosyn) - Monitor abdominal  examination - Pain control prn; antiemetics prn - Monitor hyperbilirubinemia; improving  - Mobilize as feasible - May need to engage oncology to help with care discussion - we have reached out to them   - Further management per primary service; we will follow    All of the above findings and recommendations were discussed with the patient, and the medical team, and all of patient's questions were answered to her expressed satisfaction.  -- Edison Simon, PA-C [facility] 10/04/2022, 7:38 AM M-F: 7am - 4pm

## 2022-10-04 NOTE — Progress Notes (Signed)
PROGRESS NOTE  Kristina Ellis [id number] DOB: 01/14/1951   PCP: [facility] [place]  Patient is from: Home  DOA: 10/02/2022 LOS: 2  Chief complaints Chief Complaint  Patient presents with   Abdominal Pain    X2 days      Brief Narrative / Interim history: 72 year old F with PMH of A-fib on Eliquis, cholelithiasis, bladder cancer, HTN, CAD and hepatitis presenting with RUQ pain, nausea and vomiting.  Workup including CT abdomen and pelvis and MRCP concerning for acute calculus cholecystitis, hepatic cirrhosis and heterogeneous hepatic steatosis.  General surgery consulted and recommended percutaneous drain but IR feels patient is a poor candidate for percutaneous cholecystostomy due to cirrhosis and ascites with increased risk of bleeding and bacterial peritonitis, and recommended conservative management.  Patient is on IV Zosyn.  General surgery following.  Blood culture with Streptococcus pneumonia.  ID consulted.  Subjective: Seen and examined earlier this morning.  No major events overnight or this morning.  Mild RVR this morning but not symptomatic.  Continues to endorse abdominal pain across lower abdomen.  Denies chest pain or shortness of breath.  Tolerated clear liquid diet.  Objective: Vitals:   10/03/22 2240 10/04/22 0436 10/04/22 0823 10/04/22 1138  BP: (!) 114/95 106/76 102/66 98/76  Pulse: (!) 101 62 72 100  Resp:  20 18   Temp:  97.9 F (36.6 C) 98 F (36.7 C)   TempSrc:  Oral Oral   SpO2:  94% 100%   Weight:      Height:        Examination:  GENERAL: Appears frail.  Nontoxic. HEENT: MMM.  Vision and hearing grossly intact.  NECK: Supple.  No apparent JVD.  RESP:  No IWOB.  Fair aeration bilaterally. CVS:  RRR. Heart sounds normal.  ABD/GI/GU: BS+.  Tenderness over RUQ and across lower abdomen.  Seems to be guarding or anxious. MSK/EXT:   No apparent deformity.  BLE weakness. SKIN: no apparent skin lesion or wound NEURO: Awake and  alert. Oriented appropriately.  No apparent focal neuro deficit. PSYCH: Calm. Normal affect.   Procedures:  None  Microbiology summarized: Blood culture with Streptococcus pneumonia  Assessment and plan: Principal Problem:   Cholecystitis Active Problems:   Atrial fibrillation with rapid ventricular response (HCC)   Bladder cancer (HCC)   Elevated troponin level not due myocardial infarction   Coronary artery disease involving native coronary artery of native heart without angina pectoris   Severe sepsis (HCC)   Bacteremia due to Streptococcus  Severe sepsis: POA.  Patient had fever, tachycardia, tachypnea and lactic acidosis on presentation.  Due to Streptococcus bacteremia and acute calculus cholecystitis.  Blood culture with Streptococcus pneumoniae in 1 out of 4 bottles.  -Continue IV Zosyn -Repeat blood culture and echocardiogram -ID consulted  Acute calculus cholecystitis: Imaging including CT and MRI concerning for acute calculus cholecystitis.  -General surgery following and ordered IR PERC chole -IR-poor candidate for perc chole due to cirrhosis and ascites with increased risk of bleeding and bacterial peritonitis -IR recommends conservative management -Continue IV Zosyn -Continue IV fluid -Advance diet to full liquid  Liver cirrhosis: Imaging concerning for hepatic cirrhosis and heterogeneous hepatic steatosis.  Appears compensated.  Paroxysmal atrial fibrillation with RVR:   Seems to be on Lopressor 100 mg twice daily and amiodarone at home which were held while NPO. -Resume home amiodarone -Resume home metoprolol at 25 mg twice daily -IV metoprolol 2.5 mg every 6 hours as needed -Optimize electrolytes -Resume Eliquis since  there is no plan for surgery  History of CAD: Stable. -Continue home meds   Hypokalemia/hypomagnesemia -Monitor replenish as appropriate  Lactic acidosis: Likely due to severe sepsis and liver cirrhosis.  Resolved.   History of bladder  cancer: Stable.   -Continue per oncology  Goal of care: Significant comorbidity as above.   Poor long-term prognosis. -Palliative medicine consulted.   Body mass index is 24.27 kg/m.          DVT prophylaxis:  SCDs Start: 10/02/22 2313  Code Status: Full code Family Communication: Updated patient's sister, [name] over the phone. Level of care: Telemetry Medical Status is: Inpatient Remains inpatient appropriate because: Severe sepsis due to Streptococcus bacteremia and calculus cholecystitis   Final disposition: TBD Consultants:  General surgery IR Infectious disease Palliative medicine  55 minutes with more than 50% spent in reviewing records, counseling patient/family and coordinating care.   Sch Meds:  Scheduled Meds:  amiodarone  200 mg Oral Daily   metoprolol tartrate  25 mg Oral BID   Continuous Infusions:  lactated ringers 1,000 mL with potassium chloride 40 mEq infusion 100 mL/hr at 10/04/22 1157   piperacillin-tazobactam (ZOSYN)  IV Stopped (10/04/22 1030)   PRN Meds:.metoprolol tartrate, morphine injection, ondansetron **OR** ondansetron (ZOFRAN) IV  Antimicrobials: Anti-infectives (From admission, onward)    Start     Dose/Rate Route Frequency Ordered Stop   10/03/22 0900  cefTRIAXone (ROCEPHIN) 1 g in sodium chloride 0.9 % 100 mL IVPB  Status:  Discontinued        1 g 200 mL/hr over 30 Minutes Intravenous Every 24 hours 10/03/22 0813 10/03/22 0814   10/03/22 0900  metroNIDAZOLE (FLAGYL) IVPB 500 mg  Status:  Discontinued        500 mg 100 mL/hr over 60 Minutes Intravenous Every 12 hours 10/03/22 0813 10/03/22 0814   10/03/22 0600  piperacillin-tazobactam (ZOSYN) IVPB 3.375 g        3.375 g 12.5 mL/hr over 240 Minutes Intravenous Every 8 hours 10/02/22 2310     10/02/22 1915  vancomycin (VANCOCIN) IVPB 1000 mg/200 mL premix        1,000 mg 200 mL/hr over 60 Minutes Intravenous  Once 10/02/22 1911 10/02/22 2154   10/02/22 1900  ceFEPIme (MAXIPIME)  2 g in sodium chloride 0.9 % 100 mL IVPB        2 g 200 mL/hr over 30 Minutes Intravenous  Once 10/02/22 1857 10/02/22 1941   10/02/22 1900  metroNIDAZOLE (FLAGYL) IVPB 500 mg        500 mg 100 mL/hr over 60 Minutes Intravenous  Once 10/02/22 1857 10/02/22 2046        I have personally reviewed the following labs and images: CBC: Recent Labs  Lab 10/02/22 1834 10/03/22 0844 10/04/22 0449  WBC 5.6 10.0 9.1  NEUTROABS  --  9.0* 8.3*  HGB 11.4* 10.7* 10.2*  HCT 35.4* 32.2* 30.2*  MCV 108.3* 104.5* 104.9*  PLT 241 151 175   BMP &GFR Recent Labs  Lab 10/02/22 1833 10/03/22 0844 10/04/22 0449  NA 136 137 135  K 3.3* 3.5 3.8  CL 105 109 108  CO2 17* 20* 19*  GLUCOSE 112* 90 97  BUN '8 9 12  '$ CREATININE 0.82 0.84 0.89  CALCIUM 7.9* 7.4* 7.3*  MG 1.6* 1.2* 2.2  PHOS  --  4.3 3.5   Estimated Creatinine Clearance: 43.7 mL/min (by C-G formula based on SCr of 0.89 mg/dL). Liver & Pancreas: Recent Labs  Lab 10/02/22 1833 10/03/22  [id number] 10/04/22 0449  AST 40 30 27  ALT '16 13 10  '$ ALKPHOS 64 40 39  BILITOT 1.2 2.0* 1.5*  PROT 7.4 6.0* 5.6*  ALBUMIN 2.3* 1.9* 1.6*   Recent Labs  Lab 10/02/22 1833 10/03/22 0844  LIPASE 55* 44   No results for input(s): "AMMONIA" in the last 168 hours. Diabetic: No results for input(s): "HGBA1C" in the last 72 hours. No results for input(s): "GLUCAP" in the last 168 hours. Cardiac Enzymes: No results for input(s): "CKTOTAL", "CKMB", "CKMBINDEX", "TROPONINI" in the last 168 hours. No results for input(s): "PROBNP" in the last 8760 hours. Coagulation Profile: Recent Labs  Lab 10/02/22 1832 10/03/22 0353 10/04/22 0449  INR 1.7* 1.7* 1.6*   Thyroid Function Tests: Recent Labs    10/02/22 1833  TSH 8.816*  FREET4 1.53*   Lipid Profile: No results for input(s): "CHOL", "HDL", "LDLCALC", "TRIG", "CHOLHDL", "LDLDIRECT" in the last 72 hours. Anemia Panel: No results for input(s): "VITAMINB12", "FOLATE", "FERRITIN", "TIBC",  "IRON", "RETICCTPCT" in the last 72 hours. Urine analysis:    Component Value Date/Time   COLORURINE AMBER (A) 10/02/2022 2005   COLORURINE AMBER (A) 10/02/2022 2005   APPEARANCEUR HAZY (A) 10/02/2022 2005   APPEARANCEUR HAZY (A) 10/02/2022 2005   APPEARANCEUR Clear 07/29/2022 1353   LABSPEC 1.019 10/02/2022 2005   LABSPEC 1.019 10/02/2022 2005   PHURINE 6.0 10/02/2022 2005   PHURINE 6.0 10/02/2022 2005   GLUCOSEU NEGATIVE 10/02/2022 2005   GLUCOSEU NEGATIVE 10/02/2022 2005   HGBUR NEGATIVE 10/02/2022 2005   HGBUR NEGATIVE 10/02/2022 2005   [place] NEGATIVE 10/02/2022 2005   BILIRUBINUR SMALL (A) 10/02/2022 2005   BILIRUBINUR Negative 07/29/2022 [place] 10/02/2022 2005   KETONESUR NEGATIVE 10/02/2022 2005   PROTEINUR 100 (A) 10/02/2022 2005   PROTEINUR 100 (A) 10/02/2022 2005   NITRITE NEGATIVE 10/02/2022 2005   NITRITE NEGATIVE 10/02/2022 2005   LEUKOCYTESUR TRACE (A) 10/02/2022 2005   LEUKOCYTESUR SMALL (A) 10/02/2022 2005   Sepsis Labs: Invalid input(s): "PROCALCITONIN", "LACTICIDVEN"  Microbiology: Recent Results (from the past 240 hour(s))  Blood Culture (routine x 2)     Status: Abnormal (Preliminary result)   Collection Time: 10/02/22  6:32 PM   Specimen: BLOOD  Result Value Ref Range Status   Specimen Description   Final    BLOOD LEFT ANTECUBITAL Performed at [facility], [address], [place], [place] [address]    Special Requests   Final    BOTTLES DRAWN AEROBIC AND ANAEROBIC Blood Culture adequate volume Performed at [facility], [address] [place], [place] [address]    Culture  Setup Time   Final    Organism ID to follow [place] CRITICAL RESULT CALLED TO, READ BACK BY AND VERIFIED WITH: CAROLINE COULTER 10/03/22 1431 AMK Performed at [facility], [address], [place], [place] [address]    Culture (A)  Final    STREPTOCOCCUS PNEUMONIAE SUSCEPTIBILITIES TO  FOLLOW Performed at [facility], [address], [place], [place] [address]    Report Status PENDING  Incomplete  Blood Culture ID Panel (Reflexed)     Status: Abnormal   Collection Time: 10/02/22  6:32 PM  Result Value Ref Range Status   Enterococcus faecalis NOT DETECTED NOT DETECTED Final   Enterococcus Faecium NOT DETECTED NOT DETECTED Final   Listeria monocytogenes NOT DETECTED NOT DETECTED Final   Staphylococcus species NOT DETECTED NOT DETECTED Final   Staphylococcus aureus (BCID) NOT DETECTED  NOT DETECTED Final   Staphylococcus epidermidis NOT DETECTED NOT DETECTED Final   Staphylococcus lugdunensis NOT DETECTED NOT DETECTED Final   Streptococcus species DETECTED (A) NOT DETECTED Final    Comment: CRITICAL RESULT CALLED TO, READ BACK BY AND VERIFIED WITH: CAROLINE COULTER 10/03/22 1431 AMK    Streptococcus agalactiae NOT DETECTED NOT DETECTED Final   Streptococcus pneumoniae DETECTED (A) NOT DETECTED Final    Comment: CRITICAL RESULT CALLED TO, READ BACK BY AND VERIFIED WITH: CAROLINE COULTER 10/03/22 1431 AMK    Streptococcus pyogenes NOT DETECTED NOT DETECTED Final   A.calcoaceticus-baumannii NOT DETECTED NOT DETECTED Final   Bacteroides fragilis NOT DETECTED NOT DETECTED Final   Enterobacterales NOT DETECTED NOT DETECTED Final   Enterobacter cloacae complex NOT DETECTED NOT DETECTED Final   Escherichia coli NOT DETECTED NOT DETECTED Final   Klebsiella aerogenes NOT DETECTED NOT DETECTED Final   Klebsiella oxytoca NOT DETECTED NOT DETECTED Final   Klebsiella pneumoniae NOT DETECTED NOT DETECTED Final   Proteus species NOT DETECTED NOT DETECTED Final   Salmonella species NOT DETECTED NOT DETECTED Final   Serratia marcescens NOT DETECTED NOT DETECTED Final   Haemophilus influenzae NOT DETECTED NOT DETECTED Final   Neisseria meningitidis NOT DETECTED NOT DETECTED Final   Pseudomonas aeruginosa NOT DETECTED NOT DETECTED Final   Stenotrophomonas maltophilia NOT  DETECTED NOT DETECTED Final   Candida albicans NOT DETECTED NOT DETECTED Final   Candida auris NOT DETECTED NOT DETECTED Final   Candida glabrata NOT DETECTED NOT DETECTED Final   Candida krusei NOT DETECTED NOT DETECTED Final   Candida parapsilosis NOT DETECTED NOT DETECTED Final   Candida tropicalis NOT DETECTED NOT DETECTED Final   Cryptococcus neoformans/gattii NOT DETECTED NOT DETECTED Final    Comment: Performed at [facility], [address] [place], [place] [address]  Blood Culture (routine x 2)     Status: None (Preliminary result)   Collection Time: 10/02/22  8:03 PM   Specimen: BLOOD RIGHT ARM  Result Value Ref Range Status   Specimen Description BLOOD RIGHT ARM  Final   Special Requests   Final    BOTTLES DRAWN AEROBIC AND ANAEROBIC Blood Culture results may not be optimal due to an inadequate volume of blood received in culture bottles   Culture   Final    NO GROWTH 2 DAYS Performed at [facility], [address] [place], [place] [address]    Report Status PENDING  Incomplete    Radiology Studies: No results found.    Ethie Curless T. [place]  If 7PM-7AM, please contact night-coverage [email or web address] 10/04/2022, 12:05 PM

## 2022-10-04 NOTE — Progress Notes (Signed)
Patient is a yellow mews. No new assessment changes. Patient has been in AFIB/Aflutter. MD Cyndia Skeeters is aware. Yellow Mews guidelines implemented.

## 2022-10-04 NOTE — Evaluation (Signed)
Occupational Therapy Evaluation Patient Details Name: Kristina Ellis MRN: [id number] DOB: 05-14-1951 Today's Date: 10/04/2022   History of Present Illness Pt is a 72 y/o F admitted on 10/02/22 after presenting with RUQ pain, N&V. Workup concerning for acute calculus cholecystitis, hepatic cirrhosis and heterogeneous hepatic steatosis. General surgery consulted and recommended percutaneous drain but IR feels patient is a poor candidate for percutaneous cholecystostomy due to cirrhosis and ascites with increased risk of bleeding and bacterial peritonitis, and recommended conservative management. PMH: a-fib on eliquis, cholelithiasis, bladder CA, HTN, CAD, hepatitis, NSTEMI, pulmonary artery hypertension, vestibular neuronitis   Clinical Impression   Pt agreeable to OT/PT co-treatment to maximize safety and participation. Patient presenting with decreased independence in self care, balance, functional mobility/transfers, and endurance. PTA pt received assistance for LB dressing and IADLs. She normally furniture walks in the home and uses [facility] for longer distances. Pt currently functioning at [place] guard for bed mobility, supervision for sit<>stand, and Min A for step pivot transfer from EOB>recliner without an AD. Pt will benefit from acute OT to increase overall independence in the areas of ADLs and functional mobility in order to safely discharge to next venue of care. Pt would benefit from ongoing therapy upon discharge to maximize independence with ADLs, functional mobility, decrease fall risk, and decrease caregiver burden.     Recommendations for follow up therapy are one component of a multi-disciplinary discharge planning process, led by the attending physician.  Recommendations may be updated based on patient status, additional functional criteria and insurance authorization.   Follow Up Recommendations  Home health OT     Assistance Recommended at Discharge Intermittent Supervision/Assistance   Patient can return home with the following A little help with walking and/or transfers;A little help with bathing/dressing/bathroom;Assistance with cooking/housework;Assist for transportation;Help with stairs or ramp for entrance    Functional Status Assessment  Patient has had a recent decline in their functional status and demonstrates the ability to make significant improvements in function in a reasonable and predictable amount of time.  Equipment Recommendations  None recommended by OT    Recommendations for Other Services       Precautions / Restrictions Precautions Precautions: Fall Restrictions Weight Bearing Restrictions: No      Mobility Bed Mobility Overal bed mobility: Needs Assistance Bed Mobility: Supine to Sit     Supine to sit: Min guard, HOB elevated     General bed mobility comments: extra time 2/2 abdominal pain    Transfers Overall transfer level: Needs assistance Equipment used: None Transfers: Sit to/from Stand, Bed to chair/wheelchair/BSC Sit to Stand: Supervision     Step pivot transfers: Min assist            Balance Overall balance assessment: Needs assistance Sitting-balance support: Feet supported, Bilateral upper extremity supported Sitting balance-Leahy Scale: Good     Standing balance support: During functional activity, Bilateral upper extremity supported, Single extremity supported Standing balance-Leahy Scale: Fair                             ADL either performed or assessed with clinical judgement   ADL Overall ADL's : Needs assistance/impaired                     Lower Body Dressing: Maximal assistance;Sitting/lateral leans Lower Body Dressing Details (indicate cue type and reason): 2/2 abdominal pain Toilet Transfer: Minimal assistance Toilet Transfer Details (indicate cue type and reason): simulated via step  pivot transfer from EOB>recliner, no AD                 Vision Patient Visual  Report: No change from baseline       Perception     Praxis      Pertinent Vitals/Pain Pain Assessment Pain Assessment: Faces Faces Pain Scale: Hurts even more Pain Location: abdomen, more with movement Pain Descriptors / Indicators: Grimacing, Guarding Pain Intervention(s): Monitored during session, Limited activity within patient's tolerance, Repositioned, Patient requesting pain meds-RN notified     Hand Dominance Right   Extremity/Trunk Assessment Upper Extremity Assessment Upper Extremity Assessment: Generalized weakness   Lower Extremity Assessment Lower Extremity Assessment: Generalized weakness       Communication Communication Communication: No difficulties   Cognition Arousal/Alertness: Awake/alert Behavior During Therapy: WFL for tasks assessed/performed Overall Cognitive Status: Within Functional Limits for tasks assessed                                       General Comments  HR 111-151 bpm, elevated with just lying supine in bed. Telemetry monitor stating "A-fib" - MD notified and cleared pt for OOB mobility to recliner    Exercises Other Exercises Other Exercises: OT provided education re: role of OT, OT POC, post acute recs, sitting up for all meals, EOB/OOB mobility with assistance, home/fall safety.     Shoulder Instructions      Home Living Family/patient expects to be discharged to:: Private residence Living Arrangements: Children (son) Available Help at Discharge: Available PRN/intermittently;Family Type of Home: Apartment Home Access: Level entry     Home Layout: One level     Bathroom Shower/Tub: Tub/shower unit;Sponge bathes at baseline   [employer]: [employer] - single point;Shower [place] (2 wheels)          Prior Functioning/Environment Prior Level of Function : Needs assist             Mobility Comments: Pt reports she ambulates longer distances with SPC, holds  to furniture PRN in the home. Notes last fall was around Thanksgiving. ADLs Comments: Pt reports her son stays with her a night most nights, helps with the household tasks, cooking and driving.  She reports she requires assist with lower body dressing and her son or sister in law helps. Has been taking sponge bathes recently due to fear of falling.        OT Problem List: Decreased strength;Cardiopulmonary status limiting activity;Decreased activity tolerance;Impaired balance (sitting and/or standing);Decreased safety awareness;Decreased knowledge of use of DME or AE;Decreased knowledge of precautions      OT Treatment/Interventions: Self-care/ADL training;Splinting;Therapeutic exercise;Neuromuscular education;Energy conservation;DME and/or AE instruction;Manual therapy;Modalities;Balance training;Patient/family education;Cognitive remediation/compensation;Therapeutic activities;Visual/perceptual remediation/compensation    OT Goals(Current goals can be found in the care plan section) Acute Rehab OT Goals Patient Stated Goal: return home, reduce abdominal pain OT Goal Formulation: With patient Time For Goal Achievement: 10/18/22 Potential to Achieve Goals: Good   OT Frequency: Min 2X/week    Co-evaluation PT/OT/SLP Co-Evaluation/Treatment: Yes Reason for Co-Treatment: Other (comment) (pt reports she does not want to get up twice for therapy 2/2 abdominal pain) PT goals addressed during session: Mobility/safety with mobility;Balance OT goals addressed during session: ADL's and self-care      AM-PAC OT "6 Clicks" Daily Activity     Outcome Measure Help from another person eating meals?: None Help from another person  taking care of personal grooming?: A Little Help from another person toileting, which includes using toliet, bedpan, or urinal?: A Lot Help from another person bathing (including washing, rinsing, drying)?: A Lot Help from another person to put on and taking off regular  upper body clothing?: A Little Help from another person to put on and taking off regular lower body clothing?: A Lot 6 Click Score: 16   End of Session Nurse Communication: Mobility status;Other (comment) (HR)  Activity Tolerance: Patient tolerated treatment well;Treatment limited secondary to medical complications (Comment) (elevated HR) Patient left: in chair;with call bell/phone within reach;with chair alarm set  OT Visit Diagnosis: Other abnormalities of gait and mobility (R26.89);Muscle weakness (generalized) (M62.81)                Time: 1040-1102 OT Time Calculation (min): 22 min Charges:  OT General Charges $OT Visit: 1 Visit OT Evaluation $OT Eval Moderate Complexity: 1 Mod  [facility] MS, OTR/L ascom [phone]  10/04/22, 2:11 PM

## 2022-10-04 NOTE — Consult Note (Signed)
NAME: Kristina Ellis  DOB: [birth year]  MRN: [id number]  Date/Time: 10/04/2022 12:42 PM  REQUESTING PROVIDER: Georges Lynch Subjective:  REASON FOR CONSULT: Pneumococcus bacteremia ? Kristina Ellis is a 72 y.o. with a history of Progressive stage IV bladder cancer, status post transurethral resection and now on Keytruda, atrial fibrillation, CBD calculus, needing ERCP 2023 iron deficiency anemia, hypertension, she is followed by Dr. Grayland Ormond for the bladder cancer and is on Keytruda on the sixth cycle was on 09/21/2022.  She presented to the ED on 10/02/2022 with right upper quadrant abdominal pain.  She was also having fever and shaking.  She had some vomiting. In the ED vitals temperature of 98.6, BP 100/74, pulse 124 and respiratory 22 with sats of 100%. WBC was 5.6, Hb 11.4, platelet 241 and creatinine 0.82.  Potassium was 3.3.  Magnesium 1.6.  Albumin 2.3 and lipase 55.  AST and ALT were normal.  Bilirubin was 1.2.  Alkaline phosphatase was 64. Blood culture was sent. CT of the abdomen showed cholelithiasis with gallstone impacted within the gallbladder neck.  There was pericholecystic infiltration concerning for cholecystitis.  There was mild left hydronephrosis and hydroureter.  And stable subacute fracture of the superior endplate of 624THL and the medial right 11th and 12th ribs proximally.  Cirrhosis was noted. Patient underwent MRI of the abdomen and that showed cholelithiasis concerning for acute cholecystitis.  There was no evidence of biliary tract obstruction.  Patient was started on vancomycin, cefepime and metronidazole.  She was seen by surgeon and he discussed percutaneous cholecystostomy tube.  She refused that In the past in 2023 she had refused cholecystectomy. I am seeing the patient because of blood cultures positive for pneumococcus Past Medical History:  Diagnosis Date   Aortic atherosclerosis ([place])    Arthritis    Atrial fibrillation ([place])    a.) s/p TEE with cardioversion on  02/04/2020. b.) on daily apixaban.   Bladder mass    a.) CT 04/08/2021 --> 3.2 cm intraluminal bladder mass.   Carotid stenosis, bilateral    a.) Doppler 07/04/2020 --> mild; 1-49% stenosis BILATERALLY.   Cholelithiasis    a.) CT 11/06/2020 --> largest measured 4.5 cm   Chronic anticoagulation    a.) Apixaban   Common biliary duct calculus    a.) CT 04/08/2021 --> CBD dilated at 8 mm; 54m calculus within distal CBD.   Coronary artery disease involving native coronary artery of native heart without angina pectoris [id number]  Diastolic dysfunction    a.) TTE 07/04/2020 --> LVEF 60-65%; G1DD.   Hepatic cirrhosis (H[facility])    Hepatic steatosis    Hepatitis 1970   History of 2019 novel coronavirus disease (COVID-19) 12/20/2019   History of marijuana use    Hypertension    Hypertensive retinopathy    IDA (iron deficiency anemia)    Mitral stenosis    a.) TEE 04/10/2020 --> mild. b.) TTE 07/04/2020 --> EF 60-65%; mild (mean gradient 5 mmHg).   Moderate pulmonary arterial systolic hypertension ([facility] 12/22/2019   Nephrolithiasis    NSTEMI (non-ST elevated myocardial infarction) ([facility] 07/26/2016   Open-angle glaucoma 09/11/2010   PAH (pulmonary artery hypertension) ([facility]    a.) TTE 12/21/2019 --> PASP 44 mmHg.   Uterine fibroid    a.) CT 04/08/2021 --> multiple with largest measuring 7 cm.   Valvular regurgitation    a.) TTE 09/01/2010 --> trivial to mild pan-valvular. b.) TTE 12/21/2019 --> mild MR and AR; moderate TR. c.) TTE 07/04/2020 --> mild TR; trivial  MR and PR.   Vestibular neuronitis     Past Surgical History:  Procedure Laterality Date   BREAST CYST ASPIRATION Left    COLONOSCOPY  2012   ERCP N/A 12/18/2021   Procedure: ENDOSCOPIC RETROGRADE CHOLANGIOPANCREATOGRAPHY (ERCP);  Surgeon: Lucilla Lame, MD;  Location: [facility] ENDOSCOPY;  Service: Endoscopy;  Laterality: N/A;   TEE WITH CARDIOVERSION N/A 02/04/2020   Procedure: TEE WITH CARDIOVERSION; Location: [facility]; Surgeon: Kandis Cocking, MD    TRANSURETHRAL RESECTION OF BLADDER TUMOR N/A 05/15/2021   Procedure: TRANSURETHRAL RESECTION OF BLADDER TUMOR (TURBT);  Surgeon: Billey Co, MD;  Location: [facility] ORS;  Service: Urology;  Laterality: N/A;    Social History   Socioeconomic History   Marital status: Single    Spouse name: Not on file   Number of children: 1   Years of education: Not on file   Highest education level: Not on file  Occupational History   Not on file  Tobacco Use   Smoking status: Former    Packs/day: 0.10    Years: 10.00    Total pack years: 1.00    Types: Cigarettes    Passive exposure: Past   Smokeless tobacco: Never   Tobacco comments:    occasional smoker  Vaping Use   Vaping Use: Never used  Substance and Sexual Activity   Alcohol use: Yes    Alcohol/week: 5.0 standard drinks of alcohol    Types: 5 Cans of beer per week    Comment: weekly   Drug use: Not Currently    Types: 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, 70's   Sexual activity: Not on file  Other Topics Concern   Not on file  Social History Narrative   Lives with son   Social Determinants of Health   Financial Resource Strain: Low Risk  (06/18/2022)   Overall Financial Resource Strain (CARDIA)    Difficulty of Paying Living Expenses: Not very hard  Food Insecurity: No Food Insecurity (10/03/2022)   Hunger Vital Sign    Worried About Running Out of Food in the Last Year: Never true    Ran Out of Food in the Last Year: Never true  Transportation Needs: No Transportation Needs (10/03/2022)   PRAPARE - Hydrologist (Medical): No    Lack of Transportation (Non-Medical): No  Physical Activity: Inactive (06/18/2022)   Exercise Vital Sign    Days of Exercise per Week: 0 days    Minutes of Exercise per Session: 0 min  Stress: No Stress Concern Present (06/18/2022)   [place]    Feeling of Stress : Only a little  Social  Connections: Socially Isolated (06/18/2022)   Social Connection and Isolation Panel [NHANES]    Frequency of Communication with Friends and Family: More than three times a week    Frequency of Social Gatherings with Friends and Family: More than three times a week    Attends Religious Services: Never    Marine scientist or Organizations: No    Attends Archivist Meetings: Never    Marital Status: Never married  Intimate Partner Violence: Not At Risk (10/03/2022)   Humiliation, Afraid, Rape, and Kick questionnaire    Fear of Current or Ex-Partner: No    Emotionally Abused: No    Physically Abused: No    Sexually Abused: No    Family History  Problem Relation Age of Onset   Aneurysm Mother  Colon cancer Father    Lung cancer Brother    Breast cancer Neg Hx    Allergies  Allergen Reactions   Atenolol Other (See Comments)    bradycardia bradycardia    current medicines Amiodarone Metoprolol Midodrine Zosyn Eliquis Current Facility-Administered Medications  Medication Dose Route Frequency Provider Last Rate Last Admin   amiodarone (PACERONE) tablet 200 mg  200 mg Oral Daily Wendee Beavers T, MD   200 mg at 10/04/22 1142   apixaban (ELIQUIS) tablet 5 mg  5 mg Oral BID Wendee Beavers T, MD       metoprolol tartrate (LOPRESSOR) injection 2.5 mg  2.5 mg Intravenous Q4H PRN Wendee Beavers T, MD   2.5 mg at 10/04/22 1217   metoprolol tartrate (LOPRESSOR) tablet 25 mg  25 mg Oral BID Gonfa, Taye T, MD       midodrine (PROAMATINE) tablet 5 mg  5 mg Oral TID WC Gonfa, Taye T, MD       morphine (PF) 2 MG/ML injection 2 mg  2 mg Intravenous Q2H PRN Elwyn Reach, MD   2 mg at 10/04/22 1049   ondansetron (ZOFRAN) tablet 4 mg  4 mg Oral Q6H PRN Elwyn Reach, MD       Or   ondansetron (ZOFRAN) injection 4 mg  4 mg Intravenous Q6H PRN Elwyn Reach, MD   4 mg at 10/04/22 1049   piperacillin-tazobactam (ZOSYN) IVPB 3.375 g  3.375 g Intravenous Q8H Renda Rolls, RPH    Stopped at 10/04/22 1030   Facility-Administered Medications Ordered in Other Encounters  Medication Dose Route Frequency Provider Last Rate Last Admin   heparin lock flush 100 unit/mL  500 Units Intracatheter Once PRN Lloyd Huger, MD       sodium chloride flush (NS) 0.9 % injection 10 mL  10 mL Intracatheter PRN Lloyd Huger, MD         Abtx:  Anti-infectives (From admission, onward)    Start     Dose/Rate Route Frequency Ordered Stop   10/03/22 0900  cefTRIAXone (ROCEPHIN) 1 g in sodium chloride 0.9 % 100 mL IVPB  Status:  Discontinued        1 g 200 mL/hr over 30 Minutes Intravenous Every 24 hours 10/03/22 0813 10/03/22 0814   10/03/22 0900  metroNIDAZOLE (FLAGYL) IVPB 500 mg  Status:  Discontinued        500 mg 100 mL/hr over 60 Minutes Intravenous Every 12 hours 10/03/22 0813 10/03/22 0814   10/03/22 0600  piperacillin-tazobactam (ZOSYN) IVPB 3.375 g        3.375 g 12.5 mL/hr over 240 Minutes Intravenous Every 8 hours 10/02/22 2310     10/02/22 1915  vancomycin (VANCOCIN) IVPB 1000 mg/200 mL premix        1,000 mg 200 mL/hr over 60 Minutes Intravenous  Once 10/02/22 1911 10/02/22 2154   10/02/22 1900  ceFEPIme (MAXIPIME) 2 g in sodium chloride 0.9 % 100 mL IVPB        2 g 200 mL/hr over 30 Minutes Intravenous  Once 10/02/22 1857 10/02/22 1941   10/02/22 1900  metroNIDAZOLE (FLAGYL) IVPB 500 mg        500 mg 100 mL/hr over 60 Minutes Intravenous  Once 10/02/22 1857 10/02/22 2046       REVIEW OF SYSTEMS:  Const: fever,  chills, negative weight loss Eyes: negative diplopia or visual changes, negative eye pain ENT: negative coryza, negative sore throat Resp: negative cough, hemoptysis, dyspnea  Cards: negative for chest pain, palpitations, lower extremity edema GU: negative for frequency, dysuria and hematuria GI: ++ abdominal pain, no diarrhea, bleeding, constipation Skin: negative for rash and pruritus Heme: negative for easy bruising and gum/nose  bleeding MS: muscle weakness Neurolo:negative for headaches, dizziness, vertigo, memory problems  Psych: negative for feelings of anxiety, depression  Endocrine: negative for thyroid, diabetes Allergy/Immunology- negative for any medication or food allergies ? Pertinent Positives include : Objective:  VITALS:  BP 98/76   Pulse 100   Temp 98 F (36.7 C) (Oral)   Resp 18   Ht '4\' 11"'$  (1.499 m)   Wt 54.5 kg   SpO2 100%   BMI 24.27 kg/m   PHYSICAL EXAM:  General: Alert, cooperative, no distress, appears stated age.  Head: Normocephalic, without obvious abnormality, atraumatic. Eyes: Conjunctivae clear, anicteric sclerae. Pupils are equal ENT Nares normal. No drainage or sinus tenderness. Lips, mucosa, and tongue normal. No Thrush Neck: Supple, symmetrical, no adenopathy, thyroid: non tender no carotid bruit and no JVD. Back: No CVA tenderness. Lungs: Clear to auscultation bilaterally. No Wheezing or Rhonchi. No rales. Heart: Regular rate and rhythm, no murmur, rub or gallop. Abdomen: Soft, non-tender,not distended. Bowel sounds normal. No masses Extremities: atraumatic, no cyanosis. No edema. No clubbing Skin: No rashes or lesions. Or bruising Lymph: Cervical, supraclavicular normal. Neurologic: Grossly non-focal Pertinent Labs Lab Results CBC    Component Value Date/Time   WBC 9.1 10/04/2022 0449   RBC 2.88 (L) 10/04/2022 0449   HGB 10.2 (L) 10/04/2022 0449   HCT 30.2 (L) 10/04/2022 0449   PLT 175 10/04/2022 0449   MCV 104.9 (H) 10/04/2022 0449   MCH 35.4 (H) 10/04/2022 0449   MCHC 33.8 10/04/2022 0449   RDW 14.4 10/04/2022 0449   LYMPHSABS 0.3 (L) 10/04/2022 0449   MONOABS 0.4 10/04/2022 0449   EOSABS 0.0 10/04/2022 0449   BASOSABS 0.0 10/04/2022 0449       Latest Ref Rng & Units 10/04/2022    4:49 AM 10/03/2022    8:44 AM 10/02/2022    6:33 PM  CMP  Glucose 70 - 99 mg/dL 97  90  112   BUN 8 - 23 mg/dL '12  9  8   '$ Creatinine 0.44 - 1.00 mg/dL 0.89  0.84  0.82    Sodium 135 - 145 mmol/L 135  137  136   Potassium 3.5 - 5.1 mmol/L 3.8  3.5  3.3   Chloride 98 - 111 mmol/L 108  109  105   CO2 22 - 32 mmol/L '19  20  17   '$ Calcium 8.9 - 10.3 mg/dL 7.3  7.4  7.9   Total Protein 6.5 - 8.1 g/dL 5.6  6.0  7.4   Total Bilirubin 0.3 - 1.2 mg/dL 1.5  2.0  1.2   Alkaline Phos 38 - 126 U/L 39  40  64   AST 15 - 41 U/L 27  30  40   ALT 0 - 44 U/L '10  13  16       '$ Microbiology: Recent Results (from the past 240 hour(s))  Blood Culture (routine x 2)     Status: Abnormal (Preliminary result)   Collection Time: 10/02/22  6:32 PM   Specimen: BLOOD  Result Value Ref Range Status   Specimen Description   Final    BLOOD LEFT ANTECUBITAL Performed at [facility], [address] [place], [place] [address]    Special Requests   Final    BOTTLES  DRAWN AEROBIC AND ANAEROBIC Blood Culture adequate volume Performed at [facility], [address] [place], [place] [address]    Culture  Setup Time   Final    Organism ID to follow GRAM POSITIVE COCCI AEROBIC BOTTLE ONLY CRITICAL RESULT CALLED TO, READ BACK BY AND VERIFIED WITH: CAROLINE COULTER 10/03/22 1431 AMK Performed at [facility], [address], [place], [place] [address]    Culture (A)  Final    STREPTOCOCCUS PNEUMONIAE SUSCEPTIBILITIES TO FOLLOW Performed at [facility], [place] [address] [place], [place] [address]    Report Status PENDING  Incomplete  Blood Culture ID Panel (Reflexed)     Status: Abnormal   Collection Time: 10/02/22  6:32 PM  Result Value Ref Range Status   Enterococcus faecalis NOT DETECTED NOT DETECTED Final   Enterococcus Faecium NOT DETECTED NOT DETECTED Final   Listeria monocytogenes NOT DETECTED NOT DETECTED Final   Staphylococcus species NOT DETECTED NOT DETECTED Final   Staphylococcus aureus (BCID) NOT DETECTED NOT DETECTED Final   Staphylococcus epidermidis NOT DETECTED NOT DETECTED Final   Staphylococcus lugdunensis NOT DETECTED  NOT DETECTED Final   Streptococcus species DETECTED (A) NOT DETECTED Final    Comment: CRITICAL RESULT CALLED TO, READ BACK BY AND VERIFIED WITH: CAROLINE COULTER 10/03/22 1431 AMK    Streptococcus agalactiae NOT DETECTED NOT DETECTED Final   Streptococcus pneumoniae DETECTED (A) NOT DETECTED Final    Comment: CRITICAL RESULT CALLED TO, READ BACK BY AND VERIFIED WITH: CAROLINE COULTER 10/03/22 1431 AMK    Streptococcus pyogenes NOT DETECTED NOT DETECTED Final   A.calcoaceticus-baumannii NOT DETECTED NOT DETECTED Final   Bacteroides fragilis NOT DETECTED NOT DETECTED Final   Enterobacterales NOT DETECTED NOT DETECTED Final   Enterobacter cloacae complex NOT DETECTED NOT DETECTED Final   Escherichia coli NOT DETECTED NOT DETECTED Final   Klebsiella aerogenes NOT DETECTED NOT DETECTED Final   Klebsiella oxytoca NOT DETECTED NOT DETECTED Final   Klebsiella pneumoniae NOT DETECTED NOT DETECTED Final   Proteus species NOT DETECTED NOT DETECTED Final   Salmonella species NOT DETECTED NOT DETECTED Final   Serratia marcescens NOT DETECTED NOT DETECTED Final   Haemophilus influenzae NOT DETECTED NOT DETECTED Final   Neisseria meningitidis NOT DETECTED NOT DETECTED Final   Pseudomonas aeruginosa NOT DETECTED NOT DETECTED Final   Stenotrophomonas maltophilia NOT DETECTED NOT DETECTED Final   Candida albicans NOT DETECTED NOT DETECTED Final   Candida auris NOT DETECTED NOT DETECTED Final   Candida glabrata NOT DETECTED NOT DETECTED Final   Candida krusei NOT DETECTED NOT DETECTED Final   Candida parapsilosis NOT DETECTED NOT DETECTED Final   Candida tropicalis NOT DETECTED NOT DETECTED Final   Cryptococcus neoformans/gattii NOT DETECTED NOT DETECTED Final    Comment: Performed at [facility], [address] [place], [place] [address]  Blood Culture (routine x 2)     Status: None (Preliminary result)   Collection Time: 10/02/22  8:03 PM   Specimen: BLOOD RIGHT ARM  Result Value Ref  Range Status   Specimen Description BLOOD RIGHT ARM  Final   Special Requests   Final    BOTTLES DRAWN AEROBIC AND ANAEROBIC Blood Culture results may not be optimal due to an inadequate volume of blood received in culture bottles   Culture   Final    NO GROWTH 2 DAYS Performed at [facility], [address], [place], [place] [address]    Report Status PENDING  Incomplete    IMAGING  RESULTS:  I have personally reviewed the films ?CXR no infiltrate- mitral valve annular calcification  Impression/Recommendation Strep pneumo bacteremia Repeat Blood culture and echo done today  Cholelithiasis and acute cholecystitis- will need Percutaneous cholecystostomy and bile sent for culture Patient has refused any sort of intervention.  Followed by surgeon Continue zosyn- will change to ceftriaxone soon   Afib on amiodarone  progressive bladder cancer on Keytruda. ? Mildly elevated bilirubin  Normal kidney function though CT scan showed mild left show hydronephrosis and hydroureter due to the bladder cancer ___________________________________________________ Discussed with patient, requesting provider Note:  This document was prepared using Dragon voice recognition software and may include unintentional dictation errors.

## 2022-10-04 NOTE — Progress Notes (Signed)
Patient being transferred to room 259 for amiodarone drip admin. During transfer with this nurse patient A&O, breathing regular unlabored. Family at bedside during transport.

## 2022-10-04 NOTE — Consult Note (Signed)
Hematology/Oncology Consult note Telephone:([phone]) [phone] Fax:(336) [phone]   REASON FOR COSULTATION:  Metastatic bladder cancer History of presenting illness-  72 y.o. female with PMH listed at below who presents to ER for evaluation of right upper quadrant abdominal pain nausea vomiting. 10/02/2022 CT chest abdomen pelvis with contrast showed 1. Interval development of small right pleural effusion, trace interstitial pulmonary edema, mild ascites, and progressive diffuse subcutaneous body wall edema in keeping with progressive anasarca and/or cardiogenic failure. 2. Cholelithiasis with gallstone impacted within the gallbladder neck. Interval development of pericholecystic infiltration which may relate to inflammatory changes the gallbladder, as can be seen with calculus cholecystitis, or progressive ascites. Subtle inflammatory change involving the adjacent hepatic flexure of the colon, however, suggests acute cholecystitis. Correlation with liver enzymes and possible right upper quadrant sonography may be helpful for further evaluation. 3. Cirrhosis. 4. Stable mild left hydronephrosis and hydroureter to the level of the left ureterovesicular junction, possibly related to the patient's known underlying bladder malignancy. No mass lesion identified, however. 5. Stable subacute fractures of the superior endplate of 624THL anteriorly and the medial right eleventh and twelfth ribs proximal to the costotransverse junction. No acute bone abnormality identified. 6. Mild coronary artery calcification. 7.  Aortic Atherosclerosis  10/02/2022, ultrasound abdomen right upper quadrant showed cholelithiasis and stable gallbladder wall thickening.  Hepatic steatosis and hepatic cirrhosis, ascites MRI MRCP of the abdomen with and without contrast showed 1. Cholelithiasis with imaging findings highly concerning for acute cholecystitis. Surgical consultation is recommended. 2. No choledocholithiasis or  findings of biliary tract obstruction. 3. Hepatic cirrhosis and heterogeneous hepatic steatosis. No aggressive appearing hepatic lesion noted at this time. 4. Trace volume of ascites. 5. Small right pleural effusion lying dependently.   Patient was evaluated by surgery Dr. Dahlia Byes.  And was recommended to have percutaneous cholecystostomy tube placement.  Patient declined the procedure due to the concern of cancer or metastasis if she has any surgical intervention or procedures.  Oncology was consulted.    Patient has a history of metastatic bladder cancer on Keytruda.  Recent PET imaging in January 2024 showed NED. Patient follows up with Dr. Cecille Aver.  Today.  I am covering Dr. Cecille Aver to see this patient today. She is having echocardiogram done at the bedside. She feels fine and has no new complaints.   Allergies  Allergen Reactions   Atenolol Other (See Comments)    bradycardia bradycardia     Patient Active Problem List   Diagnosis Date Noted   Severe sepsis ([place]) 10/03/2022   Bacteremia due to Streptococcus 10/03/2022   Abdominal pain    Atrial fibrillation with rapid ventricular response ([place]) 04/11/2022   Hypomagnesemia 04/11/2022   Chest pain 04/11/2022   Alcohol use 04/11/2022   Diarrhea 04/11/2022   Calculus of common duct    Duodenal stenosis    Cholecystitis 12/17/2021   UTI (urinary tract infection) 12/17/2021   Arthritis of left shoulder region 09/22/2021   Pain of cervical spine 09/22/2021   Cervical radiculopathy 09/22/2021   Cervical spondylosis 09/22/2021   Lactic acidosis 08/09/2021   Elevated troponin level not due myocardial infarction 08/09/2021   Acute pain of left shoulder 08/09/2021   Bladder cancer ([place]) 05/29/2021   Moderate pulmonary arterial systolic hypertension ([place]) 12/22/2019   Severe tricuspid regurgitation 12/22/2019   Moderate mitral regurgitation 12/22/2019   Coronary artery disease involving native coronary artery of native heart  without angina pectoris 12/21/2019   NSTEMI (non-ST elevated myocardial infarction) ([place]) 07/26/2016   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 b.) on daily apixaban.   Bladder mass    a.) CT 04/08/2021 --> 3.2 cm intraluminal bladder mass.   Carotid stenosis, bilateral    a.) Doppler 07/04/2020 --> mild; 1-49% stenosis BILATERALLY.   Cholelithiasis    a.) CT 11/06/2020 --> largest measured 4.5 cm   Chronic anticoagulation    a.) Apixaban   Common biliary duct calculus    a.) CT 04/08/2021 --> CBD dilated at 8 mm; 62m calculus within distal CBD.   Coronary artery disease involving native coronary artery of native heart without angina pectoris [id number]  Diastolic dysfunction    a.) TTE 07/04/2020 --> LVEF 60-65%; G1DD.   Hepatic cirrhosis (HCC)    Hepatic steatosis    Hepatitis 1970   History of 2019 novel coronavirus disease (COVID-19) 12/20/2019   History of marijuana use    Hypertension    Hypertensive retinopathy    IDA (iron deficiency anemia)    Mitral stenosis    a.) TEE 04/10/2020 --> mild. b.) TTE 07/04/2020 --> EF 60-65%; mild (mean gradient 5 mmHg).   Moderate pulmonary arterial systolic hypertension ([place] 12/22/2019   Nephrolithiasis    [place]STEMI (non-ST elevated myocardial infarction) [place] 07/26/2016   Open-angle glaucoma 09/11/2010   PAH (pulmonary artery hypertension) ([place]    a.) TTE 12/21/2019 --> PASP 44 mmHg.   Uterine fibroid    a.) CT 04/08/2021 --> multiple with largest measuring 7 cm.   Valvular regurgitation    a.) TTE 09/01/2010 --> trivial to mild pan-valvular. b.) TTE 12/21/2019 --> mild MR and AR; moderate TR. c.) TTE 07/04/2020 --> mild TR; trivial MR and PR.   Vestibular neuronitis      Past Surgical History:  Procedure Laterality Date   BREAST CYST ASPIRATION Left    COLONOSCOPY  2012    ERCP N/A 12/18/2021   Procedure: ENDOSCOPIC RETROGRADE CHOLANGIOPANCREATOGRAPHY (ERCP);  Surgeon: WLucilla Lame MD;  [facility];  Service: Endoscopy;  Laterality: N/A;   TEE WITH CARDIOVERSION N/A 02/04/2020   Procedure: TEE WITH CARDIOVERSION; Location: [facility]; Surgeon: CKandis Cocking MD   TRANSURETHRAL RESECTION OF BLADDER TUMOR N/A 05/15/2021   Procedure: TRANSURETHRAL RESECTION OF BLADDER TUMOR (TURBT);  Surgeon: SBilley Co MD;  Location: [facility];  Service: Urology;  Laterality: N/A;    Social History   Socioeconomic History   Marital status: Single    Spouse name: Not on file   Number of children: 1   Years of education: Not on file   Highest education level: Not on file  Occupational History   Not on file  Tobacco Use   Smoking status: Former    Packs/day: 0.10    Years: 10.00    Total pack years: 1.00    Types: Cigarettes    Passive exposure: Past   Smokeless tobacco: Never   Tobacco comments:    occasional smoker  Vaping Use   Vaping Use: Never used  Substance and Sexual Activity   Alcohol use: Yes    Alcohol/week: 5.0 standard drinks of alcohol    Types: 5 Cans of beer per week    Comment: weekly   Drug use: Not Currently    Types: 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, 70's   Sexual activity: Not on file  Other Topics Concern  Not on file  Social History Narrative   Lives with son   Social Determinants of Health   Financial Resource Strain: Low Risk  (06/18/2022)   Overall Financial Resource Strain (CARDIA)    Difficulty of Paying Living Expenses: Not very hard  Food Insecurity: No Food Insecurity (10/03/2022)   Hunger Vital Sign    Worried About Running Out of Food in the Last Year: Never true    Ran Out of Food in the Last Year: Never true  Transportation Needs: No Transportation Needs (10/03/2022)   PRAPARE - Hydrologist (Medical): No    Lack of Transportation (Non-Medical): No  Physical Activity: Inactive  (06/18/2022)   Exercise Vital Sign    Days of Exercise per Week: 0 days    Minutes of Exercise per Session: 0 min  Stress: No Stress Concern Present (06/18/2022)   [place]    Feeling of Stress : Only a little  Social Connections: Socially Isolated (06/18/2022)   Social Connection and Isolation Panel [NHANES]    Frequency of Communication with Friends and Family: More than three times a week    Frequency of Social Gatherings with Friends and Family: More than three times a week    Attends Religious Services: Never    Marine scientist or Organizations: No    Attends Archivist Meetings: Never    Marital Status: Never married  Intimate Partner Violence: Not At Risk (10/03/2022)   Humiliation, Afraid, Rape, and Kick questionnaire    Fear of Current or Ex-Partner: No    Emotionally Abused: No    Physically Abused: No    Sexually Abused: No     Family History  Problem Relation Age of Onset   Aneurysm Mother    Colon cancer Father    Lung cancer Brother    Breast cancer Neg Hx      Current Facility-Administered Medications:    amiodarone (PACERONE) tablet 200 mg, 200 mg, Oral, Daily, Gonfa, Taye T, MD, 200 mg at 10/04/22 1142   apixaban (ELIQUIS) tablet 5 mg, 5 mg, Oral, BID, Cyndia Skeeters, Taye T, MD, 5 mg at 10/04/22 1406   metoprolol tartrate (LOPRESSOR) injection 2.5 mg, 2.5 mg, Intravenous, Q4H PRN, Cyndia Skeeters, Taye T, MD, 2.5 mg at 10/04/22 1217   metoprolol tartrate (LOPRESSOR) tablet 25 mg, 25 mg, Oral, BID, Gonfa, Taye T, MD   midodrine (PROAMATINE) tablet 5 mg, 5 mg, Oral, TID WC, Gonfa, Taye T, MD, 5 mg at 10/04/22 1406   morphine (PF) 2 MG/ML injection 2 mg, 2 mg, Intravenous, Q2H PRN, Jonelle Sidle, Mohammad L, MD, 2 mg at 10/04/22 1049   ondansetron (ZOFRAN) tablet 4 mg, 4 mg, Oral, Q6H PRN **OR** ondansetron (ZOFRAN) injection 4 mg, 4 mg, Intravenous, Q6H PRN, Jonelle Sidle, Mohammad L, MD, 4 mg at 10/04/22 1049    piperacillin-tazobactam (ZOSYN) IVPB 3.375 g, 3.375 g, Intravenous, Q8H, Belue, Alver Sorrow, RPH, Last Rate: 12.5 mL/hr at 10/04/22 1408, 3.375 g at 10/04/22 1408  Facility-Administered Medications Ordered in Other Encounters:    heparin lock flush 100 unit/mL, 500 Units, Intracatheter, Once PRN, Grayland Ormond, Kathlene November, MD   sodium chloride flush (NS) 0.9 % injection 10 mL, 10 mL, Intracatheter, PRN, Lloyd Huger, MD  Review of Systems  Constitutional:  Negative for appetite change, chills, fatigue and fever.  HENT:   Negative for hearing loss and voice change.   Eyes:  Negative for eye problems.  Respiratory:  Negative for chest tightness and cough.   Cardiovascular:  Negative for chest pain.  Gastrointestinal:  Positive for abdominal pain, nausea and vomiting. Negative for abdominal distention and blood in stool.  Endocrine: Negative for hot flashes.  Genitourinary:  Negative for difficulty urinating and frequency.   Musculoskeletal:  Negative for arthralgias.  Skin:  Negative for itching and rash.  Neurological:  Negative for extremity weakness.  Hematological:  Negative for adenopathy.  Psychiatric/Behavioral:  Negative for confusion.     PHYSICAL EXAM Vitals:   10/04/22 0823 10/04/22 1138 10/04/22 1402 10/04/22 1500  BP: 102/66 98/76 (!) 131/120 113/76  Pulse: 72 100 (!) 135 (!) 103  Resp: 18     Temp: 98 F (36.7 C)   97.9 F (36.6 C)  TempSrc: Oral   Oral  SpO2: 100%   99%  Weight:      Height:       Physical Exam Constitutional:      General: She is not in acute distress.    Appearance: She is not diaphoretic.  HENT:     Head: Normocephalic.     Nose: Nose normal.     Mouth/Throat:     Pharynx: No oropharyngeal exudate.  Eyes:     General: No scleral icterus. Cardiovascular:     Rate and Rhythm: Tachycardia present.     Heart sounds: No murmur heard. Pulmonary:     Effort: Pulmonary effort is normal. No respiratory distress.  Abdominal:     Palpations:  Abdomen is soft.     Tenderness: There is abdominal tenderness in the right upper quadrant.  Musculoskeletal:        General: Normal range of motion.     Cervical back: Normal range of motion and neck supple.  Skin:    General: Skin is warm and dry.     Findings: No erythema.  Neurological:     Mental Status: She is alert and oriented to person, place, and time.     Cranial Nerves: No cranial nerve deficit.     Motor: No abnormal muscle tone.     Coordination: Coordination normal.  Psychiatric:        Mood and Affect: Mood and affect normal.       LABORATORY STUDIES    Latest Ref Rng & Units 10/04/2022    4:49 AM 10/03/2022    8:44 AM 10/02/2022    6:34 PM  CBC  WBC 4.0 - 10.5 K/uL 9.1  10.0  5.6   Hemoglobin 12.0 - 15.0 g/dL 10.2  10.7  11.4   Hematocrit 36.0 - 46.0 % 30.2  32.2  35.4   Platelets 150 - 400 K/uL 175  151  241       Latest Ref Rng & Units 10/04/2022    4:49 AM 10/03/2022    8:44 AM 10/02/2022    6:33 PM  CMP  Glucose 70 - 99 mg/dL 97  90  112   BUN 8 - 23 mg/dL '12  9  8   '$ Creatinine 0.44 - 1.00 mg/dL 0.89  0.84  0.82   Sodium 135 - 145 mmol/L 135  137  136   Potassium 3.5 - 5.1 mmol/L 3.8  3.5  3.3   Chloride 98 - 111 mmol/L 108  109  105   CO2 22 - 32 mmol/L '19  20  17   '$ Calcium 8.9 - 10.3 mg/dL 7.3  7.4  7.9   Total Protein 6.5 - 8.1 g/dL 5.6  6.0  7.4   Total Bilirubin 0.3 - 1.2 mg/dL 1.5  2.0  1.2   Alkaline Phos 38 - 126 U/L 39  40  64   AST 15 - 41 U/L 27  30  40   ALT 0 - 44 U/L '10  13  16      '$ RADIOGRAPHIC STUDIES: I have personally reviewed the radiological images as listed and agreed with the findings in the report. DG Chest Port 1 View  Result Date: 10/04/2022 CLINICAL DATA:  Shortness of breath. EXAM: PORTABLE CHEST 1 VIEW COMPARISON:  Chest radiograph and CT 10/02/2022 FINDINGS: The patient is rotated to the right. The cardiac silhouette is borderline enlarged. There is extensive mitral annular calcification. Lung volumes are low with  mild interstitial densities in the lung bases. No overt pulmonary edema, lobar consolidation, sizeable pleural effusion, or pneumothorax is identified. Advanced bilateral glenohumeral arthropathy is again noted. IMPRESSION: Low lung volumes with mild bibasilar opacities which could reflect atelectasis or early infection. Electronically Signed   By: Logan Bores M.D.   On: 10/04/2022 15:15   MR ABDOMEN MRCP W WO CONTAST  Result Date: 10/03/2022 CLINICAL DATA:  72 year old female with suspected biliary obstruction. EXAM: MRI ABDOMEN WITHOUT AND WITH CONTRAST (INCLUDING MRCP) TECHNIQUE: Multiplanar multisequence MR imaging of the abdomen was performed both before and after the administration of intravenous contrast. Heavily T2-weighted images of the biliary and pancreatic ducts were obtained, and three-dimensional MRCP images were rendered by post processing. CONTRAST:  36m GADAVIST GADOBUTROL 1 MMOL/ML IV SOLN COMPARISON:  Abdominal MRI 04/11/2022. Right upper quadrant abdominal ultrasound 10/03/2022. CT of the abdomen and pelvis 10/02/2022. FINDINGS: Lower chest: Small right pleural effusion lying dependently. Hepatobiliary: Diffuse but heterogeneous loss of signal intensity throughout the hepatic parenchyma on out of phase dual echo images, indicative of a background of heterogeneous hepatic steatosis. Liver has a shrunken appearance and very nodular contour, indicative of underlying cirrhosis. No definite suspicious cystic or solid hepatic lesions are confidently identified. No intra or extrahepatic biliary ductal dilatation noted on MRCP images. Common bile duct measures 6 mm in the porta hepatis. No filling defect within the common bile duct to suggest choledocholithiasis. However, there is heterogeneous signal intensity within the lumen of the gallbladder, most notable for a large signal void which measures up to 4.5 x 3.0 cm, compatible with a large stone. Gallbladder wall is irregularly thickened and  edematous measuring up to 1 cm, with surrounding pericholecystic fluid and inflammatory changes. Pancreas: No pancreatic mass. No pancreatic ductal dilatation. No pancreatic or peripancreatic fluid collections or inflammatory changes. Spleen:  Unremarkable. Adrenals/Urinary Tract: Bilateral kidneys and bilateral adrenal glands are normal in appearance. No hydroureteronephrosis in the visualized portions of the abdomen. Stomach/Bowel: Visualized portions are unremarkable. Vascular/Lymphatic: No aneurysm identified in the visualized abdominal vasculature. No lymphadenopathy noted in the abdomen. Other: Trace volume of ascites. Inflammatory changes centered around the gallbladder in the right upper quadrant of the abdomen. Incidental imaging of the pelvis demonstrates an enlarged uterus with multiple lesions, presumably fibroids. Musculoskeletal: No aggressive appearing osseous lesions are noted in the visualized portions of the skeleton. IMPRESSION: 1. Cholelithiasis with imaging findings highly concerning for acute cholecystitis. Surgical consultation is recommended. 2. No choledocholithiasis or findings of biliary tract obstruction. 3. Hepatic cirrhosis and heterogeneous hepatic steatosis. No aggressive appearing hepatic lesion noted at this time. 4. Trace volume of ascites. 5. Small right pleural effusion lying dependently. Electronically Signed   By: DVinnie LangtonM.D.   On: 10/03/2022 05:55  MR 3D Recon At Scanner  Result Date: 10/03/2022 CLINICAL DATA:  72 year old female with suspected biliary obstruction. EXAM: MRI ABDOMEN WITHOUT AND WITH CONTRAST (INCLUDING MRCP) TECHNIQUE: Multiplanar multisequence MR imaging of the abdomen was performed both before and after the administration of intravenous contrast. Heavily T2-weighted images of the biliary and pancreatic ducts were obtained, and three-dimensional MRCP images were rendered by post processing. CONTRAST:  42m GADAVIST GADOBUTROL 1 MMOL/ML IV SOLN  COMPARISON:  Abdominal MRI 04/11/2022. Right upper quadrant abdominal ultrasound 10/03/2022. CT of the abdomen and pelvis 10/02/2022. FINDINGS: Lower chest: Small right pleural effusion lying dependently. Hepatobiliary: Diffuse but heterogeneous loss of signal intensity throughout the hepatic parenchyma on out of phase dual echo images, indicative of a background of heterogeneous hepatic steatosis. Liver has a shrunken appearance and very nodular contour, indicative of underlying cirrhosis. No definite suspicious cystic or solid hepatic lesions are confidently identified. No intra or extrahepatic biliary ductal dilatation noted on MRCP images. Common bile duct measures 6 mm in the porta hepatis. No filling defect within the common bile duct to suggest choledocholithiasis. However, there is heterogeneous signal intensity within the lumen of the gallbladder, most notable for a large signal void which measures up to 4.5 x 3.0 cm, compatible with a large stone. Gallbladder wall is irregularly thickened and edematous measuring up to 1 cm, with surrounding pericholecystic fluid and inflammatory changes. Pancreas: No pancreatic mass. No pancreatic ductal dilatation. No pancreatic or peripancreatic fluid collections or inflammatory changes. Spleen:  Unremarkable. Adrenals/Urinary Tract: Bilateral kidneys and bilateral adrenal glands are normal in appearance. No hydroureteronephrosis in the visualized portions of the abdomen. Stomach/Bowel: Visualized portions are unremarkable. Vascular/Lymphatic: No aneurysm identified in the visualized abdominal vasculature. No lymphadenopathy noted in the abdomen. Other: Trace volume of ascites. Inflammatory changes centered around the gallbladder in the right upper quadrant of the abdomen. Incidental imaging of the pelvis demonstrates an enlarged uterus with multiple lesions, presumably fibroids. Musculoskeletal: No aggressive appearing osseous lesions are noted in the visualized  portions of the skeleton. IMPRESSION: 1. Cholelithiasis with imaging findings highly concerning for acute cholecystitis. Surgical consultation is recommended. 2. No choledocholithiasis or findings of biliary tract obstruction. 3. Hepatic cirrhosis and heterogeneous hepatic steatosis. No aggressive appearing hepatic lesion noted at this time. 4. Trace volume of ascites. 5. Small right pleural effusion lying dependently. Electronically Signed   By: DVinnie LangtonM.D.   On: 10/03/2022 05:55   [place] LIMITED RUQ (LIVER/GB)  Result Date: 10/03/2022 CLINICAL DATA:  Abdominal pain. EXAM: ULTRASOUND ABDOMEN LIMITED RIGHT UPPER QUADRANT COMPARISON:  April 11, 2022 FINDINGS: Gallbladder: Large echogenic gallstones are seen within the lumen of a distended gallbladder (the largest measures approximately 3.0 cm). This is seen on the prior study. Stable gallbladder wall thickening (5.4 mm) and pericholecystic fluid are also noted. The presence or absence of a sonographic MPercell Millersign was not provided by the sonographer. Common bile duct: Diameter: 10.1 mm (measured 9.2 mm on the prior study) Liver: A 1.0 cm x 1.1 cm x 1.0 cm hypoechoic area is seen within the left lobe of the liver. No flow is noted within this region on color Doppler evaluation. The liver parenchyma is nodular in contour and diffusely increased in echogenicity. Portal vein is patent on color Doppler imaging with normal direction of blood flow towards the liver. Other: There is a mild to moderate amount of perihepatic fluid. IMPRESSION: 1. Cholelithiasis and stable gallbladder wall thickening, which may be secondary to ascites. Sequelae associated with acute cholecystitis cannot  be excluded. 2. Hepatic steatosis and hepatic cirrhosis with additional findings that may represent a hepatic cyst within the left lobe. 3. Ascites. Electronically Signed   By: Virgina Norfolk M.D.   On: 10/03/2022 00:08   CT CHEST ABDOMEN PELVIS W CONTRAST  Result  Date: 10/02/2022 CLINICAL DATA:  Sepsis, abdominal pain out of proportion, lactic acidosis. Bladder cancer. EXAM: CT CHEST, ABDOMEN, AND PELVIS WITH CONTRAST TECHNIQUE: Multidetector CT imaging of the chest, abdomen and pelvis was performed following the standard protocol during bolus administration of intravenous contrast. RADIATION DOSE REDUCTION: This exam was performed according to the departmental dose-optimization program which includes automated exposure control, adjustment of the mA and/or kV according to patient size and/or use of iterative reconstruction technique. CONTRAST:  124m OMNIPAQUE IOHEXOL 300 MG/ML  SOLN COMPARISON:  PET CT 08/26/2022, CT abdomen pelvis 04/11/2022 FINDINGS: CT CHEST FINDINGS Cardiovascular: Mild coronary artery calcification. Extensive calcification of the mitral valve annulus. Global cardiac size within normal limits. No pericardial effusion. Central pulmonary arteries are enlarged in keeping with changes of pulmonary arterial hypertension. Moderate atherosclerotic calcification within the thoracic aorta. No aortic aneurysm. Mediastinum/Nodes: Pathologic right paratracheal lymph node demonstrating hypermetabolism on prior PET CT examination is stable measuring 15 mm in short axis diameter at axial image # 17/2. No new pathologic thoracic adenopathy. Esophagus unremarkable. Visualized thyroid unremarkable. Lungs/Pleura: Interval development of small right pleural effusion. Interval development of trace interstitial pulmonary edema, possibly cardiogenic in nature. No pneumothorax. No central obstructing lesion. 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 Stable subacute fractures of the superior endplate of [id number] and the medial right eleventh and twelfth ribs proximal to the costotransverse junction demonstrating callus is again noted. No acute bone abnormality. No focal lytic lesion identified. CT ABDOMEN  PELVIS FINDINGS Hepatobiliary: Cholelithiasis again noted. The gallbladder is distended and a gallstone is seen impacted within the gallbladder neck, similar to prior examination. There has, however, developed increasing pericholecystic infiltration which may relate to inflammatory changes the gallbladder, as can be seen with calculus cholecystitis, or progressive ascites. Cirrhosis. No enhancing intrahepatic mass. No intra or extrahepatic biliary ductal dilation. Pancreas: Unremarkable Spleen: Unremarkable Adrenals/Urinary Tract: The adrenal glands are unremarkable. The kidneys are normal in size and position. Mild left hydronephrosis and hydroureter to the level of the left ureterovesicular junction is stable since prior PET CT examination. No hydronephrosis on the right. No intrarenal or ureteral calculi. No enhancing intrarenal masses. The bladder is unremarkable. Stomach/Bowel: There is subtle asymmetric bowel wall thickening involving the hepatic flexure of the colon adjacent to the gallbladder suggesting an adjacent inflammatory process. The stomach, small bowel, and large bowel are otherwise unremarkable. Appendix normal. Mild ascites is present, new since prior PET CT examination. No free intraperitoneal gas. Vascular/Lymphatic: Extensive aortoiliac atherosclerotic calcification. No aortic aneurysm. Stable left periaortic adenopathy since prior PET CT examination where this demonstrate no significant metabolic activity and likely represents the residua of treated disease. No new pathologic adenopathy within the abdomen and pelvis. Reproductive: Multiple hypoenhancing masses are again seen within the uterus most in keeping with multiple uterine fibroids. The pelvic organs are otherwise unremarkable. Other: Increasing diffuse subcutaneous body wall edema in keeping with progressive anasarca. No abdominal wall hernia. Musculoskeletal: Degenerative changes are seen within the lumbar spine. No acute bone  abnormality. No lytic bone lesion. IMPRESSION: 1. Interval development of small right pleural effusion, trace interstitial pulmonary edema, mild ascites, and progressive diffuse subcutaneous body wall edema in keeping with progressive anasarca  and/or cardiogenic failure. 2. Cholelithiasis with gallstone impacted within the gallbladder neck. Interval development of pericholecystic infiltration which may relate to inflammatory changes the gallbladder, as can be seen with calculus cholecystitis, or progressive ascites. Subtle inflammatory change involving the adjacent hepatic flexure of the colon, however, suggests acute cholecystitis. Correlation with liver enzymes and possible right upper quadrant sonography may be helpful for further evaluation. 3. Cirrhosis. 4. Stable mild left hydronephrosis and hydroureter to the level of the left ureterovesicular junction, possibly related to the patient's known underlying bladder malignancy. No mass lesion identified, however. 5. Stable subacute fractures of the superior endplate of [id number] anteriorly and the medial right eleventh and twelfth ribs proximal to the costotransverse junction. No acute bone abnormality identified. 6. Mild coronary artery calcification. 7.  Aortic Atherosclerosis (ICD10-I70.0). Electronically Signed   By: Fidela Salisbury M.D.   On: 10/02/2022 21:53   DG Chest Port 1 View  Result Date: 10/02/2022 CLINICAL DATA:  Sepsis EXAM: PORTABLE CHEST 1 VIEW COMPARISON:  04/11/2022 FINDINGS: Lungs are well expanded, symmetric, and clear. No pneumothorax or pleural effusion. Cardiac size within normal limits. Extensive mitral valve annular calcifications again noted. Pulmonary vascularity is normal. Osseous structures are age-appropriate. Advanced degenerative changes noted within the shoulders bilaterally. No acute bone abnormality. IMPRESSION: 1. No active disease. Electronically Signed   By: Fidela Salisbury M.D.   On: 10/02/2022 19:38   NM PET Image Restag (PS)  Skull Base To Thigh  Result Date: 08/27/2022 CLINICAL DATA:  Subsequent treatment strategy for bladder cancer, staging. Chemotherapy 2 weeks ago. EXAM: NUCLEAR MEDICINE PET SKULL BASE TO THIGH TECHNIQUE: 6.6 mCi F-18 FDG was injected intravenously. Full-ring PET imaging was performed from the skull base to thigh after the radiotracer. CT data was obtained and used for attenuation correction and anatomic localization. Fasting blood glucose: 120 mg/dl COMPARISON:  05/26/2022 FINDINGS: Mediastinal blood pool activity: SUV max 1.6 Liver activity: SUV max NA NECK: Resolution of left low jugular hypermetabolic adenopathy. Index 7 mm node on 48/2 measured 1.5 cm on the prior. Incidental CT findings: Dense bilateral carotid atherosclerosis. Cerebral atrophy. Mucosal thickening of bilateral maxillary sinuses. CHEST: The previously described hypermetabolic mediastinal adenopathy has resolved. No pulmonary parenchymal hypermetabolism identified. Incidental CT findings: Mild cardiomegaly. Aortic and coronary artery calcification. Tiny, right larger than left pleural effusions are new. Mild septal thickening including at the right lung base is consistent with interstitial edema. ABDOMEN/PELVIS: Resolved hypermetabolic liver lesions. Resolved hypermetabolic abdominal adenopathy. An index non FDG avid left periaortic node measures 11 mm on 141/2 versus 2.2 cm on the prior exam (when remeasured). Incidental CT findings: Cirrhosis and hepatic steatosis. Gallstones up to 2.6 cm. Normal adrenal glands. Bilateral punctate renal collecting system calculi. Development of moderate left-sided hydroureteronephrosis. Suboptimal evaluation of the bladder, which appears mildly thick walled but is underdistended. The ureters are difficult to follow and there are extensive vascular calcifications. Enlarged, globular uterus.  Trace pelvic fluid is similar. SKELETON: Marked response to therapy of multifocal osseous metastasis. Low-level residual  proximal left femoral activity at a S.U.V. max of 1.8 versus a S.U.V. max of 5.4 on the prior exam. Anterior right fifth rib expansile lesion measures a S.U.V. max of 1.2 today versus a S.U.V. max of 10.1 on the prior exam. Increased sclerosis with possible pathologic fracture healing included on 93/2. Incidental CT findings: Right greater than left shoulder osteoarthritis. IMPRESSION: 1. Complete metabolic response to therapy of hepatic and nodal metastasis. 2. Near complete metabolic response to therapy of osseous metastasis. Low-level  activity within proximal left femoral and anterior right fifth rib lesions persist. 3. Development of left-sided hydroureteronephrosis, incompletely characterized due to nondedicated CT technique. Consider pre and post-contrast abdominopelvic CT using a hematuria protocol. 4. Mild congestive heart failure with tiny bilateral pleural effusions, new. 5. Incidental findings, including: Cirrhosis. Cholelithiasis. Aortic atherosclerosis (ICD10-I70.0), coronary artery atherosclerosis . Uterine enlargement is most likely related to fibroids. Trace free pelvic fluid. Sinus disease. Electronically Signed   By: Abigail Miyamoto M.D.   On: 08/27/2022 10:32     Assessment and plan-   # Stage IV bladder cancer on Keytruda, recent imaging showed NED. # Acute cholecystitis, surgery recommend percutaneous percutaneous cholecystostomy tube placement.  I had a discussion with patient and she is made aware that there is no restriction of surgery or procedures from oncology aspect.  Untreated acute cholecystitis/bacteremia may further delay her cancer treatments Patient is not interested with procedure at this time. Continue antibiotics.  Thank you for allowing me to participate in the care of this patient.   Earlie Server, MD, PhD Hematology Oncology 10/04/2022

## 2022-10-04 NOTE — Progress Notes (Signed)
*  PRELIMINARY RESULTS* Echocardiogram 2D Echocardiogram has been performed.  Kristina Ellis 10/04/2022, 4:21 PM

## 2022-10-04 NOTE — Consult Note (Incomplete)
Hematology/Oncology Consult note [phone] [phone]      Patient Care Team: Center, [facility] as PCP - General ([place]) Lloyd Huger, MD as Consulting Physician (Hematology and Oncology)   Name of the patient: Kristina Ellis  CE:[id number]  [birth year]   REASON FOR COSULTATION:   History of presenting illness-  72 y.o. female with PMH listed at below who presents to ER      Allergies  Allergen Reactions   Atenolol Other (See Comments)    bradycardia bradycardia     Patient Active Problem List   Diagnosis Date Noted   Severe sepsis ([place]) 10/03/2022   Bacteremia due to Streptococcus 10/03/2022   Abdominal pain    Atrial fibrillation with rapid ventricular response ([place]) 04/11/2022   Hypomagnesemia 04/11/2022   Chest pain 04/11/2022   Alcohol use 04/11/2022   Diarrhea 04/11/2022   Calculus of common duct    Duodenal stenosis    Cholecystitis 12/17/2021   UTI (urinary tract infection) 12/17/2021   Arthritis of left shoulder region 09/22/2021   Pain of cervical spine 09/22/2021   Cervical radiculopathy 09/22/2021   Cervical spondylosis 09/22/2021   Lactic acidosis 08/09/2021   Elevated troponin level not due myocardial infarction 08/09/2021   Acute pain of left shoulder 08/09/2021   Bladder cancer ([place]) 05/29/2021   Moderate pulmonary arterial systolic hypertension ([place]) 12/22/2019   Severe tricuspid regurgitation 12/22/2019   Moderate mitral regurgitation 12/22/2019   Coronary artery disease involving native coronary artery of native heart without angina pectoris 12/21/2019   NSTEMI (non-ST elevated myocardial infarction) ([place]) 07/26/2016   Open-angle glaucoma 09/11/2010     Past Medical History:  Diagnosis Date   Aortic atherosclerosis ([place])    Arthritis    Atrial fibrillation ([place])    a.) s/p TEE with cardioversion on 02/04/2020. b.) on daily apixaban.   Bladder mass    a.) CT 04/08/2021 -->  3.2 cm intraluminal bladder mass.   Carotid stenosis, bilateral    a.) Doppler 07/04/2020 --> mild; 1-49% stenosis BILATERALLY.   Cholelithiasis    a.) CT 11/06/2020 --> largest measured 4.5 cm   Chronic anticoagulation    a.) Apixaban   Common biliary duct calculus    a.) CT 04/08/2021 --> CBD dilated at 8 mm; 57m calculus within distal CBD.   Coronary artery disease involving native coronary artery of native heart without angina pectoris [id number]  Diastolic dysfunction    a.) TTE 07/04/2020 --> LVEF 60-65%; G1DD.   Hepatic cirrhosis (HCC)    Hepatic steatosis    Hepatitis 1970   History of 2019 novel coronavirus disease (COVID-19) 12/20/2019   History of marijuana use    Hypertension    Hypertensive retinopathy    IDA (iron deficiency anemia)    Mitral stenosis    a.) TEE 04/10/2020 --> mild. b.) TTE 07/04/2020 --> EF 60-65%; mild (mean gradient 5 mmHg).   Moderate pulmonary arterial systolic hypertension ([place] 12/22/2019   Nephrolithiasis    NSTEMI (non-ST elevated myocardial infarction) ([place] 07/26/2016   Open-angle glaucoma 09/11/2010   PAH (pulmonary artery hypertension) ([place]    a.) TTE 12/21/2019 --> PASP 44 mmHg.   Uterine fibroid    a.) CT 04/08/2021 --> multiple with largest measuring 7 cm.   Valvular regurgitation    a.) TTE 09/01/2010 --> trivial to mild pan-valvular. b.) TTE 12/21/2019 --> mild MR and AR; moderate TR. c.) TTE 07/04/2020 --> mild TR; trivial MR and PR.   Vestibular neuronitis  Past Surgical History:  Procedure Laterality Date   BREAST CYST ASPIRATION Left    COLONOSCOPY  2012   ERCP N/A 12/18/2021   Procedure: ENDOSCOPIC RETROGRADE CHOLANGIOPANCREATOGRAPHY (ERCP);  Surgeon: Lucilla Lame, MD;  Location: [facility] ENDOSCOPY;  Service: Endoscopy;  Laterality: N/A;   TEE WITH CARDIOVERSION N/A 02/04/2020   Procedure: TEE WITH CARDIOVERSION; Location: [facility]; Surgeon: Kandis Cocking, MD   TRANSURETHRAL RESECTION OF BLADDER TUMOR N/A 05/15/2021   Procedure:  TRANSURETHRAL RESECTION OF BLADDER TUMOR (TURBT);  Surgeon: Billey Co, MD;  Location: [facility] ORS;  Service: Urology;  Laterality: N/A;    Social History   Socioeconomic History   Marital status: Single    Spouse name: Not on file   Number of children: 1   Years of education: Not on file   Highest education level: Not on file  Occupational History   Not on file  Tobacco Use   Smoking status: Former    Packs/day: 0.10    Years: 10.00    Total pack years: 1.00    Types: Cigarettes    Passive exposure: Past   Smokeless tobacco: Never   Tobacco comments:    occasional smoker  Vaping Use   Vaping Use: Never used  Substance and Sexual Activity   Alcohol use: Yes    Alcohol/week: 5.0 standard drinks of alcohol    Types: 5 Cans of beer per week    Comment: weekly   Drug use: Not Currently    Types: 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, 70's   Sexual activity: Not on file  Other Topics Concern   Not on file  Social History Narrative   Lives with son   Social Determinants of Health   Financial Resource Strain: Low Risk  (06/18/2022)   Overall Financial Resource Strain (CARDIA)    Difficulty of Paying Living Expenses: Not very hard  Food Insecurity: No Food Insecurity (10/03/2022)   Hunger Vital Sign    Worried About Running Out of Food in the Last Year: Never true    Ran Out of Food in the Last Year: Never true  Transportation Needs: No Transportation Needs (10/03/2022)   PRAPARE - Hydrologist (Medical): No    Lack of Transportation (Non-Medical): No  Physical Activity: Inactive (06/18/2022)   Exercise Vital Sign    Days of Exercise per Week: 0 days    Minutes of Exercise per Session: 0 min  Stress: No Stress Concern Present (06/18/2022)   [place]    Feeling of Stress : Only a little  Social Connections: Socially Isolated (06/18/2022)   Social Connection and Isolation  Panel [NHANES]    Frequency of Communication with Friends and Family: More than three times a week    Frequency of Social Gatherings with Friends and Family: More than three times a week    Attends Religious Services: Never    Marine scientist or Organizations: No    Attends Archivist Meetings: Never    Marital Status: Never married  Intimate Partner Violence: Not At Risk (10/03/2022)   Humiliation, Afraid, Rape, and Kick questionnaire    Fear of Current or Ex-Partner: No    Emotionally Abused: No    Physically Abused: No    Sexually Abused: No     Family History  Problem Relation Age of Onset   Aneurysm Mother    Colon cancer Father    Lung cancer Brother  Breast cancer Neg Hx      Current Facility-Administered Medications:    amiodarone (PACERONE) tablet 200 mg, 200 mg, Oral, Daily, Cyndia Skeeters, Taye T, MD, 200 mg at 10/04/22 1142   lactated ringers 1,000 mL with potassium chloride 40 mEq infusion, , Intravenous, Continuous, Cyndia Skeeters, Taye T, MD, Last Rate: 100 mL/hr at 10/04/22 0354, Infusion Verify at 10/04/22 0354   metoprolol tartrate (LOPRESSOR) injection 2.5 mg, 2.5 mg, Intravenous, Q4H PRN, Cyndia Skeeters, Taye T, MD   metoprolol tartrate (LOPRESSOR) tablet 25 mg, 25 mg, Oral, BID, Gonfa, Taye T, MD   morphine (PF) 2 MG/ML injection 2 mg, 2 mg, Intravenous, Q2H PRN, Jonelle Sidle, Mohammad L, MD, 2 mg at 10/04/22 1049   ondansetron (ZOFRAN) tablet 4 mg, 4 mg, Oral, Q6H PRN **OR** ondansetron (ZOFRAN) injection 4 mg, 4 mg, Intravenous, Q6H PRN, Jonelle Sidle, Mohammad L, MD, 4 mg at 10/04/22 1049   piperacillin-tazobactam (ZOSYN) IVPB 3.375 g, 3.375 g, Intravenous, Q8H, Renda Rolls, RPH, Last Rate: 12.5 mL/hr at 10/04/22 0631, 3.375 g at 10/04/22 0631  Facility-Administered Medications Ordered in Other Encounters:    heparin lock flush 100 unit/mL, 500 Units, Intracatheter, Once PRN, [place] Ormond, Kathlene November, MD   sodium chloride flush (NS) 0.9 % injection 10 mL, 10 mL, Intracatheter, PRN,  Lloyd Huger, MD  Review of Systems - Oncology  PHYSICAL EXAM Vitals:   10/03/22 2240 10/04/22 0436 10/04/22 0823 10/04/22 1138  BP: (!) 114/95 106/76 102/66 98/76  Pulse: (!) 101 62 72 100  Resp:  20 18   Temp:  97.9 F (36.6 C) 98 F (36.7 C)   TempSrc:  Oral Oral   SpO2:  94% 100%   Weight:      Height:       Physical Exam    LABORATORY STUDIES    Latest Ref Rng & Units 10/04/2022    4:49 AM 10/03/2022    8:44 AM 10/02/2022    6:34 PM  CBC  WBC 4.0 - 10.5 K/uL 9.1  10.0  5.6   Hemoglobin 12.0 - 15.0 g/dL 10.2  10.7  11.4   Hematocrit 36.0 - 46.0 % 30.2  32.2  35.4   Platelets 150 - 400 K/uL 175  151  241       Latest Ref Rng & Units 10/04/2022    4:49 AM 10/03/2022    8:44 AM 10/02/2022    6:33 PM  CMP  Glucose 70 - 99 mg/dL 97  90  112   BUN 8 - 23 mg/dL '12  9  8   '$ Creatinine 0.44 - 1.00 mg/dL 0.89  0.84  0.82   Sodium 135 - 145 mmol/L 135  137  136   Potassium 3.5 - 5.1 mmol/L 3.8  3.5  3.3   Chloride 98 - 111 mmol/L 108  109  105   CO2 22 - 32 mmol/L '19  20  17   '$ Calcium 8.9 - 10.3 mg/dL 7.3  7.4  7.9   Total Protein 6.5 - 8.1 g/dL 5.6  6.0  7.4   Total Bilirubin 0.3 - 1.2 mg/dL 1.5  2.0  1.2   Alkaline Phos 38 - 126 U/L 39  40  64   AST 15 - 41 U/L 27  30  40   ALT 0 - 44 U/L '10  13  16      '$ RADIOGRAPHIC STUDIES: I have personally reviewed the radiological images as listed and agreed with the findings in the report. MR ABDOMEN MRCP W  WO CONTAST  Result Date: 10/03/2022 CLINICAL DATA:  72 year old female with suspected biliary obstruction. EXAM: MRI ABDOMEN WITHOUT AND WITH CONTRAST (INCLUDING MRCP) TECHNIQUE: Multiplanar multisequence MR imaging of the abdomen was performed both before and after the administration of intravenous contrast. Heavily T2-weighted images of the biliary and pancreatic ducts were obtained, and three-dimensional MRCP images were rendered by post processing. CONTRAST:  58m GADAVIST GADOBUTROL 1 MMOL/ML IV SOLN COMPARISON:   Abdominal MRI 04/11/2022. Right upper quadrant abdominal ultrasound 10/03/2022. CT of the abdomen and pelvis 10/02/2022. FINDINGS: Lower chest: Small right pleural effusion lying dependently. Hepatobiliary: Diffuse but heterogeneous loss of signal intensity throughout the hepatic parenchyma on out of phase dual echo images, indicative of a background of heterogeneous hepatic steatosis. Liver has a shrunken appearance and very nodular contour, indicative of underlying cirrhosis. No definite suspicious cystic or solid hepatic lesions are confidently identified. No intra or extrahepatic biliary ductal dilatation noted on MRCP images. Common bile duct measures 6 mm in the porta hepatis. No filling defect within the common bile duct to suggest choledocholithiasis. However, there is heterogeneous signal intensity within the lumen of the gallbladder, most notable for a large signal void which measures up to 4.5 x 3.0 cm, compatible with a large stone. Gallbladder wall is irregularly thickened and edematous measuring up to 1 cm, with surrounding pericholecystic fluid and inflammatory changes. Pancreas: No pancreatic mass. No pancreatic ductal dilatation. No pancreatic or peripancreatic fluid collections or inflammatory changes. Spleen:  Unremarkable. Adrenals/Urinary Tract: Bilateral kidneys and bilateral adrenal glands are normal in appearance. No hydroureteronephrosis in the visualized portions of the abdomen. Stomach/Bowel: Visualized portions are unremarkable. Vascular/Lymphatic: No aneurysm identified in the visualized abdominal vasculature. No lymphadenopathy noted in the abdomen. Other: Trace volume of ascites. Inflammatory changes centered around the gallbladder in the right upper quadrant of the abdomen. Incidental imaging of the pelvis demonstrates an enlarged uterus with multiple lesions, presumably fibroids. Musculoskeletal: No aggressive appearing osseous lesions are noted in the visualized portions of the  skeleton. IMPRESSION: 1. Cholelithiasis with imaging findings highly concerning for acute cholecystitis. Surgical consultation is recommended. 2. No choledocholithiasis or findings of biliary tract obstruction. 3. Hepatic cirrhosis and heterogeneous hepatic steatosis. No aggressive appearing hepatic lesion noted at this time. 4. Trace volume of ascites. 5. Small right pleural effusion lying dependently. Electronically Signed   By: DVinnie LangtonM.D.   On: 10/03/2022 05:55   MR 3D Recon At Scanner  Result Date: 10/03/2022 CLINICAL DATA:  72year old female with suspected biliary obstruction. EXAM: MRI ABDOMEN WITHOUT AND WITH CONTRAST (INCLUDING MRCP) TECHNIQUE: Multiplanar multisequence MR imaging of the abdomen was performed both before and after the administration of intravenous contrast. Heavily T2-weighted images of the biliary and pancreatic ducts were obtained, and three-dimensional MRCP images were rendered by post processing. CONTRAST:  51mGADAVIST GADOBUTROL 1 MMOL/ML IV SOLN COMPARISON:  Abdominal MRI 04/11/2022. Right upper quadrant abdominal ultrasound 10/03/2022. CT of the abdomen and pelvis 10/02/2022. FINDINGS: Lower chest: Small right pleural effusion lying dependently. Hepatobiliary: Diffuse but heterogeneous loss of signal intensity throughout the hepatic parenchyma on out of phase dual echo images, indicative of a background of heterogeneous hepatic steatosis. Liver has a shrunken appearance and very nodular contour, indicative of underlying cirrhosis. No definite suspicious cystic or solid hepatic lesions are confidently identified. No intra or extrahepatic biliary ductal dilatation noted on MRCP images. Common bile duct measures 6 mm in the porta hepatis. No filling defect within the common bile duct to suggest choledocholithiasis. However, there is  heterogeneous signal intensity within the lumen of the gallbladder, most notable for a large signal void which measures up to 4.5 x 3.0 cm,  compatible with a large stone. Gallbladder wall is irregularly thickened and edematous measuring up to 1 cm, with surrounding pericholecystic fluid and inflammatory changes. Pancreas: No pancreatic mass. No pancreatic ductal dilatation. No pancreatic or peripancreatic fluid collections or inflammatory changes. Spleen:  Unremarkable. Adrenals/Urinary Tract: Bilateral kidneys and bilateral adrenal glands are normal in appearance. No hydroureteronephrosis in the visualized portions of the abdomen. Stomach/Bowel: Visualized portions are unremarkable. Vascular/Lymphatic: No aneurysm identified in the visualized abdominal vasculature. No lymphadenopathy noted in the abdomen. Other: Trace volume of ascites. Inflammatory changes centered around the gallbladder in the right upper quadrant of the abdomen. Incidental imaging of the pelvis demonstrates an enlarged uterus with multiple lesions, presumably fibroids. Musculoskeletal: No aggressive appearing osseous lesions are noted in the visualized portions of the skeleton. IMPRESSION: 1. Cholelithiasis with imaging findings highly concerning for acute cholecystitis. Surgical consultation is recommended. 2. No choledocholithiasis or findings of biliary tract obstruction. 3. Hepatic cirrhosis and heterogeneous hepatic steatosis. No aggressive appearing hepatic lesion noted at this time. 4. Trace volume of ascites. 5. Small right pleural effusion lying dependently. Electronically Signed   By: Vinnie Langton M.D.   On: 10/03/2022 05:55   US ABDOMEN LIMITED RUQ (LIVER/GB)  Result Date: 10/03/2022 CLINICAL DATA:  Abdominal pain. EXAM: ULTRASOUND ABDOMEN LIMITED RIGHT UPPER QUADRANT COMPARISON:  April 11, 2022 FINDINGS: Gallbladder: Large echogenic gallstones are seen within the lumen of a distended gallbladder (the largest measures approximately 3.0 cm). This is seen on the prior study. Stable gallbladder wall thickening (5.4 mm) and pericholecystic fluid are also noted. The  presence or absence of a sonographic Percell Miller sign was not provided by the sonographer. Common bile duct: Diameter: 10.1 mm (measured 9.2 mm on the prior study) Liver: A 1.0 cm x 1.1 cm x 1.0 cm hypoechoic area is seen within the left lobe of the liver. No flow is noted within this region on color Doppler evaluation. The liver parenchyma is nodular in contour and diffusely increased in echogenicity. Portal vein is patent on color Doppler imaging with normal direction of blood flow towards the liver. Other: There is a mild to moderate amount of perihepatic fluid. IMPRESSION: 1. Cholelithiasis and stable gallbladder wall thickening, which may be secondary to ascites. Sequelae associated with acute cholecystitis cannot be excluded. 2. Hepatic steatosis and hepatic cirrhosis with additional findings that may represent a hepatic cyst within the left lobe. 3. Ascites. Electronically Signed   By: Virgina Norfolk M.D.   On: 10/03/2022 00:08   CT CHEST ABDOMEN PELVIS W CONTRAST  Result Date: 10/02/2022 CLINICAL DATA:  Sepsis, abdominal pain out of proportion, lactic acidosis. Bladder cancer. EXAM: CT CHEST, ABDOMEN, AND PELVIS WITH CONTRAST TECHNIQUE: Multidetector CT imaging of the chest, abdomen and pelvis was performed following the standard protocol during bolus administration of intravenous contrast. RADIATION DOSE REDUCTION: This exam was performed according to the departmental dose-optimization program which includes automated exposure control, adjustment of the mA and/or kV according to patient size and/or use of iterative reconstruction technique. CONTRAST:  149m OMNIPAQUE IOHEXOL 300 MG/ML  SOLN COMPARISON:  PET CT 08/26/2022, CT abdomen pelvis 04/11/2022 FINDINGS: CT CHEST FINDINGS Cardiovascular: Mild coronary artery calcification. Extensive calcification of the mitral valve annulus. Global cardiac size within normal limits. No pericardial effusion. Central pulmonary arteries are enlarged in keeping with  changes of pulmonary arterial hypertension. Moderate atherosclerotic calcification within the  thoracic aorta. No aortic aneurysm. Mediastinum/Nodes: Pathologic right paratracheal lymph node demonstrating hypermetabolism on prior PET CT examination is stable measuring 15 mm in short axis diameter at axial image # 17/2. No new pathologic thoracic adenopathy. Esophagus unremarkable. Visualized thyroid unremarkable. Lungs/Pleura: Interval development of small right pleural effusion. Interval development of trace interstitial pulmonary edema, possibly cardiogenic in nature. No pneumothorax. No central obstructing lesion. 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 Stable subacute fractures of the superior endplate of 624THL anteriorly and the medial right eleventh and twelfth ribs proximal to the costotransverse junction demonstrating callus is again noted. No acute bone abnormality. No focal lytic lesion identified. CT ABDOMEN PELVIS FINDINGS Hepatobiliary: Cholelithiasis again noted. The gallbladder is distended and a gallstone is seen impacted within the gallbladder neck, similar to prior examination. There has, however, developed increasing pericholecystic infiltration which may relate to inflammatory changes the gallbladder, as can be seen with calculus cholecystitis, or progressive ascites. Cirrhosis. No enhancing intrahepatic mass. No intra or extrahepatic biliary ductal dilation. Pancreas: Unremarkable Spleen: Unremarkable Adrenals/Urinary Tract: The adrenal glands are unremarkable. The kidneys are normal in size and position. Mild left hydronephrosis and hydroureter to the level of the left ureterovesicular junction is stable since prior PET CT examination. No hydronephrosis on the right. No intrarenal or ureteral calculi. No enhancing intrarenal masses. The bladder is unremarkable. Stomach/Bowel: There is subtle asymmetric bowel wall thickening  involving the hepatic flexure of the colon adjacent to the gallbladder suggesting an adjacent inflammatory process. The stomach, small bowel, and large bowel are otherwise unremarkable. Appendix normal. Mild ascites is present, new since prior PET CT examination. No free intraperitoneal gas. Vascular/Lymphatic: Extensive aortoiliac atherosclerotic calcification. No aortic aneurysm. Stable left periaortic adenopathy since prior PET CT examination where this demonstrate no significant metabolic activity and likely represents the residua of treated disease. No new pathologic adenopathy within the abdomen and pelvis. Reproductive: Multiple hypoenhancing masses are again seen within the uterus most in keeping with multiple uterine fibroids. The pelvic organs are otherwise unremarkable. Other: Increasing diffuse subcutaneous body wall edema in keeping with progressive anasarca. No abdominal wall hernia. Musculoskeletal: Degenerative changes are seen within the lumbar spine. No acute bone abnormality. No lytic bone lesion. IMPRESSION: 1. Interval development of small right pleural effusion, trace interstitial pulmonary edema, mild ascites, and progressive diffuse subcutaneous body wall edema in keeping with progressive anasarca and/or cardiogenic failure. 2. Cholelithiasis with gallstone impacted within the gallbladder neck. Interval development of pericholecystic infiltration which may relate to inflammatory changes the gallbladder, as can be seen with calculus cholecystitis, or progressive ascites. Subtle inflammatory change involving the adjacent hepatic flexure of the colon, however, suggests acute cholecystitis. Correlation with liver enzymes and possible right upper quadrant sonography may be helpful for further evaluation. 3. Cirrhosis. 4. Stable mild left hydronephrosis and hydroureter to the level of the left ureterovesicular junction, possibly related to the patient's known underlying bladder malignancy. No mass  lesion identified, however. 5. Stable subacute fractures of the superior endplate of 624THL anteriorly and the medial right eleventh and twelfth ribs proximal to the costotransverse junction. No acute bone abnormality identified. 6. Mild coronary artery calcification. 7.  Aortic Atherosclerosis (ICD10-I70.0). Electronically Signed   By: Fidela Salisbury M.D.   On: 10/02/2022 21:53   DG Chest Port 1 View  Result Date: 10/02/2022 CLINICAL DATA:  Sepsis EXAM: PORTABLE CHEST 1 VIEW COMPARISON:  04/11/2022 FINDINGS: Lungs are well expanded, symmetric, and clear. No pneumothorax or pleural effusion. Cardiac size  within normal limits. Extensive mitral valve annular calcifications again noted. Pulmonary vascularity is normal. Osseous structures are age-appropriate. Advanced degenerative changes noted within the shoulders bilaterally. No acute bone abnormality. IMPRESSION: 1. No active disease. Electronically Signed   By: Fidela Salisbury M.D.   On: 10/02/2022 19:38   NM PET Image Restag (PS) Skull Base To Thigh  Result Date: 08/27/2022 CLINICAL DATA:  Subsequent treatment strategy for bladder cancer, staging. Chemotherapy 2 weeks ago. EXAM: NUCLEAR MEDICINE PET SKULL BASE TO THIGH TECHNIQUE: 6.6 mCi F-18 FDG was injected intravenously. Full-ring PET imaging was performed from the skull base to thigh after the radiotracer. CT data was obtained and used for attenuation correction and anatomic localization. Fasting blood glucose: 120 mg/dl COMPARISON:  05/26/2022 FINDINGS: Mediastinal blood pool activity: SUV max 1.6 Liver activity: SUV max NA NECK: Resolution of left low jugular hypermetabolic adenopathy. Index 7 mm node on 48/2 measured 1.5 cm on the prior. Incidental CT findings: Dense bilateral carotid atherosclerosis. Cerebral atrophy. Mucosal thickening of bilateral maxillary sinuses. CHEST: The previously described hypermetabolic mediastinal adenopathy has resolved. No pulmonary parenchymal hypermetabolism identified.  Incidental CT findings: Mild cardiomegaly. Aortic and coronary artery calcification. Tiny, right larger than left pleural effusions are new. Mild septal thickening including at the right lung base is consistent with interstitial edema. ABDOMEN/PELVIS: Resolved hypermetabolic liver lesions. Resolved hypermetabolic abdominal adenopathy. An index non FDG avid left periaortic node measures 11 mm on 141/2 versus 2.2 cm on the prior exam (when remeasured). Incidental CT findings: Cirrhosis and hepatic steatosis. Gallstones up to 2.6 cm. Normal adrenal glands. Bilateral punctate renal collecting system calculi. Development of moderate left-sided hydroureteronephrosis. Suboptimal evaluation of the bladder, which appears mildly thick walled but is underdistended. The ureters are difficult to follow and there are extensive vascular calcifications. Enlarged, globular uterus.  Trace pelvic fluid is similar. SKELETON: Marked response to therapy of multifocal osseous metastasis. Low-level residual proximal left femoral activity at a S.U.V. max of 1.8 versus a S.U.V. max of 5.4 on the prior exam. Anterior right fifth rib expansile lesion measures a S.U.V. max of 1.2 today versus a S.U.V. max of 10.1 on the prior exam. Increased sclerosis with possible pathologic fracture healing included on 93/2. Incidental CT findings: Right greater than left shoulder osteoarthritis. IMPRESSION: 1. Complete metabolic response to therapy of hepatic and nodal metastasis. 2. Near complete metabolic response to therapy of osseous metastasis. Low-level activity within proximal left femoral and anterior right fifth rib lesions persist. 3. Development of left-sided hydroureteronephrosis, incompletely characterized due to nondedicated CT technique. Consider pre and post-contrast abdominopelvic CT using a hematuria protocol. 4. Mild congestive heart failure with tiny bilateral pleural effusions, new. 5. Incidental findings, including: Cirrhosis.  Cholelithiasis. Aortic atherosclerosis (ICD10-I70.0), coronary artery atherosclerosis . Uterine enlargement is most likely related to fibroids. Trace free pelvic fluid. Sinus disease. Electronically Signed   By: Abigail Miyamoto M.D.   On: 08/27/2022 10:32     Assessment and plan-    Thank you for allowing me to participate in the care of this patient.   Earlie Server, MD, PhD Hematology Oncology 10/04/2022

## 2022-10-04 NOTE — Evaluation (Signed)
Physical Therapy Evaluation Patient Details Name: Kristina Ellis MRN: [id number] DOB: 06-07-1951 Today's Date: 10/04/2022  History of Present Illness  Pt is a 71 y/o F admitted on 10/02/22 after presenting with RUQ pain, N&V. Workup concerning for acute calculus cholecystitis, hepatic cirrhosis and heterogeneous hepatic steatosis. General surgery consulted and recommended percutaneous drain but IR feels patient is a poor candidate for percutaneous cholecystostomy due to cirrhosis and ascites with increased risk of bleeding and bacterial peritonitis, and recommended conservative management. PMH: a-fib on eliquis, cholelithiasis, bladder CA, HTN, CAD, hepatitis, NSTEMI, pulmonary artery hypertension, vestibular neuronitis  Clinical Impression  Pt seen for PT evaluation with co-tx with OT as pt reports she does not want to get up twice 2/2 abdominal pain. Pt reports prior to admission she was living alone in a first floor apartment with level entry with family assisting PRN. Pt reports she ambulates in the home by holding to furniture PRN, uses SPC to ambulate longer distances. On this date, pt is limited by elevated HR at rest & with mobility but pt is able to complete bed mobility with CGA, stand pivot to recliner without AD with CGA. Will continue to follow pt acutely to address balance, strengthening & gait with LRAD (did encourage pt to use RW to promote improved balance & reduced fall risk).       Recommendations for follow up therapy are one component of a multi-disciplinary discharge planning process, led by the attending physician.  Recommendations may be updated based on patient status, additional functional criteria and insurance authorization.  Follow Up Recommendations Home health PT      Assistance Recommended at Discharge Intermittent Supervision/Assistance  Patient can return home with the following  Assistance with cooking/housework;A little help with bathing/dressing/bathroom;A  little help with walking and/or transfers;Assist for transportation;Help with stairs or ramp for entrance    Equipment Recommendations None recommended by PT (pt reports she has a RW)  Recommendations for Other Services       Functional Status Assessment Patient has had a recent decline in their functional status and demonstrates the ability to make significant improvements in function in a reasonable and predictable amount of time.     Precautions / Restrictions Precautions Precautions: Fall Restrictions Weight Bearing Restrictions: No      Mobility  Bed Mobility Overal bed mobility: Needs Assistance Bed Mobility: Supine to Sit     Supine to sit: Min guard, HOB elevated     General bed mobility comments: extra time 2/2 abdominal pain    Transfers Overall transfer level: Needs assistance Equipment used: None Transfers: Sit to/from Stand, Bed to chair/wheelchair/BSC Sit to Stand: Supervision   Step pivot transfers: Min assist            Ambulation/Gait                  Stairs            Wheelchair Mobility    Modified Rankin (Stroke Patients Only)       Balance Overall balance assessment: Needs assistance Sitting-balance support: Feet supported, Bilateral upper extremity supported Sitting balance-Leahy Scale: Good     Standing balance support: During functional activity, Bilateral upper extremity supported, Single extremity supported Standing balance-Leahy Scale: Fair                               Pertinent Vitals/Pain Pain Assessment Pain Assessment: Faces Faces Pain Scale: Hurts even more Pain  Location: abdomen, more with movement Pain Descriptors / Indicators: Grimacing, Guarding Pain Intervention(s): Monitored during session, Repositioned, Limited activity within patient's tolerance    Home Living Family/patient expects to be discharged to:: Private residence Living Arrangements: Children Available Help at  Discharge: Available PRN/intermittently;Family Type of Home: Apartment         Home Layout: One level Home Equipment: Cane - single point;Shower seat;Rolling Environmental consultant (2 wheels)      Prior Function               Mobility Comments: Pt reports she ambulates longer distances with SPC, holds to furniture PRN in the home. Notes last fall was around Thanksgiving.       Hand Dominance        Extremity/Trunk Assessment   Upper Extremity Assessment Upper Extremity Assessment: Generalized weakness    Lower Extremity Assessment Lower Extremity Assessment: Generalized weakness       Communication   Communication: No difficulties  Cognition Arousal/Alertness: Awake/alert Behavior During Therapy: WFL for tasks assessed/performed Overall Cognitive Status: Within Functional Limits for tasks assessed                                          General Comments General comments (skin integrity, edema, etc.): HR 111-151 bpm, elevated just lying in bed but telemetry monitor also stating "A-fib" -- MD made aware & cleared pt for OOB to recliner during session.    Exercises     Assessment/Plan    PT Assessment Patient needs continued PT services  PT Problem List Decreased strength;Cardiopulmonary status limiting activity;Decreased activity tolerance;Decreased balance;Decreased mobility;Decreased safety awareness;Decreased knowledge of use of DME;Decreased knowledge of precautions       PT Treatment Interventions DME instruction;Therapeutic exercise;Balance training;Gait training;Stair training;Functional mobility training;Patient/family education;Therapeutic activities;Neuromuscular re-education    PT Goals (Current goals can be found in the Care Plan section)  Acute Rehab PT Goals Patient Stated Goal: decreased pain PT Goal Formulation: With patient Time For Goal Achievement: 10/18/22 Potential to Achieve Goals: Good    Frequency Min 2X/week      Co-evaluation PT/OT/SLP Co-Evaluation/Treatment: Yes Reason for Co-Treatment: Other (comment) (pt reports she does not want to get up twice for therapy 2/2 abdominal pain) PT goals addressed during session: Mobility/safety with mobility;Balance         AM-PAC PT "6 Clicks" Mobility  Outcome Measure Help needed turning from your back to your side while in a flat bed without using bedrails?: None Help needed moving from lying on your back to sitting on the side of a flat bed without using bedrails?: A Little Help needed moving to and from a bed to a chair (including a wheelchair)?: A Little Help needed standing up from a chair using your arms (e.g., wheelchair or bedside chair)?: A Little Help needed to walk in hospital room?: A Little Help needed climbing 3-5 steps with a railing? : A Lot 6 Click Score: 18    End of Session   Activity Tolerance: Patient tolerated treatment well;Treatment limited secondary to medical complications (Comment) Patient left: with chair alarm set;in chair;with call bell/phone within reach Nurse Communication:  (HR) PT Visit Diagnosis: Other abnormalities of gait and mobility (R26.89);Difficulty in walking, not elsewhere classified (R26.2);Muscle weakness (generalized) (M62.81)    Time: [id number] PT Time Calculation (min) (ACUTE ONLY): 15 min   Charges:   PT Evaluation $PT Eval Moderate Complexity: 1 Mod  Lavone Nian, PT, DPT 10/04/22, 11:17 AM   Waunita Schooner 10/04/2022, 11:16 AM

## 2022-10-05 DIAGNOSIS — I330 Acute and subacute infective endocarditis: Secondary | ICD-10-CM | POA: Diagnosis not present

## 2022-10-05 DIAGNOSIS — I4891 Unspecified atrial fibrillation: Secondary | ICD-10-CM | POA: Diagnosis not present

## 2022-10-05 DIAGNOSIS — K810 Acute cholecystitis: Secondary | ICD-10-CM | POA: Diagnosis not present

## 2022-10-05 DIAGNOSIS — E876 Hypokalemia: Secondary | ICD-10-CM | POA: Diagnosis not present

## 2022-10-05 DIAGNOSIS — R7989 Other specified abnormal findings of blood chemistry: Secondary | ICD-10-CM

## 2022-10-05 DIAGNOSIS — K819 Cholecystitis, unspecified: Secondary | ICD-10-CM | POA: Diagnosis not present

## 2022-10-05 DIAGNOSIS — C679 Malignant neoplasm of bladder, unspecified: Secondary | ICD-10-CM | POA: Diagnosis not present

## 2022-10-05 DIAGNOSIS — Z66 Do not resuscitate: Secondary | ICD-10-CM

## 2022-10-05 DIAGNOSIS — Z7189 Other specified counseling: Secondary | ICD-10-CM

## 2022-10-05 DIAGNOSIS — R7881 Bacteremia: Secondary | ICD-10-CM

## 2022-10-05 DIAGNOSIS — C670 Malignant neoplasm of trigone of bladder: Secondary | ICD-10-CM | POA: Diagnosis not present

## 2022-10-05 DIAGNOSIS — I5022 Chronic systolic (congestive) heart failure: Secondary | ICD-10-CM | POA: Insufficient documentation

## 2022-10-05 LAB — CBC
HCT: 30.6 % — ABNORMAL LOW (ref 36.0–46.0)
Hemoglobin: 10 g/dL — ABNORMAL LOW (ref 12.0–15.0)
MCH: 34.7 pg — ABNORMAL HIGH (ref 26.0–34.0)
MCHC: 32.7 g/dL (ref 30.0–36.0)
MCV: 106.3 fL — ABNORMAL HIGH (ref 80.0–100.0)
Platelets: 197 10*3/uL (ref 150–400)
RBC: 2.88 MIL/uL — ABNORMAL LOW (ref 3.87–5.11)
RDW: 14.6 % (ref 11.5–15.5)
WBC: 5.4 10*3/uL (ref 4.0–10.5)
nRBC: 0 % (ref 0.0–0.2)

## 2022-10-05 LAB — CULTURE, BLOOD (ROUTINE X 2): Special Requests: ADEQUATE

## 2022-10-05 LAB — APTT
aPTT: 40 seconds — ABNORMAL HIGH (ref 24–36)
aPTT: 109 seconds — ABNORMAL HIGH (ref 24–36)

## 2022-10-05 LAB — COMPREHENSIVE METABOLIC PANEL
ALT: 14 U/L (ref 0–44)
AST: 37 U/L (ref 15–41)
Albumin: 1.7 g/dL — ABNORMAL LOW (ref 3.5–5.0)
Alkaline Phosphatase: 48 U/L (ref 38–126)
Anion gap: 7 (ref 5–15)
BUN: 17 mg/dL (ref 8–23)
CO2: 19 mmol/L — ABNORMAL LOW (ref 22–32)
Calcium: 7.5 mg/dL — ABNORMAL LOW (ref 8.9–10.3)
Chloride: 105 mmol/L (ref 98–111)
Creatinine, Ser: 1 mg/dL (ref 0.44–1.00)
GFR, Estimated: >60 mL/min (ref >60)
Glucose, Bld: 90 mg/dL (ref 70–99)
Potassium: 3.8 mmol/L (ref 3.5–5.1)
Sodium: 131 mmol/L — ABNORMAL LOW (ref 135–145)
Total Bilirubin: 1.5 mg/dL — ABNORMAL HIGH (ref 0.3–1.2)
Total Protein: 5.7 g/dL — ABNORMAL LOW (ref 6.5–8.1)

## 2022-10-05 LAB — PHOSPHORUS: Phosphorus: 2.7 mg/dL (ref 2.5–4.6)

## 2022-10-05 LAB — PROTIME-INR
INR: 1.6 — ABNORMAL HIGH (ref 0.8–1.2)
Prothrombin Time: 18.5 seconds — ABNORMAL HIGH (ref 11.4–15.2)

## 2022-10-05 LAB — MAGNESIUM: Magnesium: 2 mg/dL (ref 1.7–2.4)

## 2022-10-05 MED ORDER — HEPARIN (PORCINE) 25000 UT/250ML-% IV SOLN
750.0000 [IU]/h | INTRAVENOUS | Status: DC
  Administered 2022-10-05: 750 [IU]/h via INTRAVENOUS
  Filled 2022-10-05: qty 250

## 2022-10-05 MED ORDER — DILTIAZEM HCL 30 MG PO TABS
30.0000 mg | ORAL_TABLET | Freq: Four times a day (QID) | ORAL | Status: DC
  Administered 2022-10-05 – 2022-10-06 (×4): 30 mg via ORAL
  Filled 2022-10-05 (×4): qty 1

## 2022-10-05 MED ORDER — LOPERAMIDE HCL 2 MG PO CAPS
2.0000 mg | ORAL_CAPSULE | ORAL | Status: DC | PRN
  Administered 2022-10-05 – 2022-10-10 (×8): 2 mg via ORAL
  Filled 2022-10-05 (×8): qty 1

## 2022-10-05 MED ORDER — METOPROLOL TARTRATE 25 MG PO TABS
25.0000 mg | ORAL_TABLET | Freq: Four times a day (QID) | ORAL | Status: DC
  Administered 2022-10-05 – 2022-10-06 (×5): 25 mg via ORAL
  Filled 2022-10-05 (×5): qty 1

## 2022-10-05 MED ORDER — SODIUM CHLORIDE 0.9 % IV SOLN
3.0000 g | Freq: Four times a day (QID) | INTRAVENOUS | Status: AC
  Administered 2022-10-05 – 2022-10-12 (×23): 3 g via INTRAVENOUS
  Filled 2022-10-05 (×4): qty 8
  Filled 2022-10-05: qty 3
  Filled 2022-10-05 (×2): qty 8
  Filled 2022-10-05: qty 3
  Filled 2022-10-05: qty 8
  Filled 2022-10-05: qty 3
  Filled 2022-10-05: qty 8
  Filled 2022-10-05: qty 3
  Filled 2022-10-05 (×3): qty 8
  Filled 2022-10-05 (×2): qty 3
  Filled 2022-10-05: qty 8
  Filled 2022-10-05 (×2): qty 3
  Filled 2022-10-05 (×2): qty 8
  Filled 2022-10-05 (×2): qty 3
  Filled 2022-10-05: qty 8

## 2022-10-05 NOTE — Progress Notes (Signed)
OT Cancellation Note  Patient Details Name: Kristina Ellis MRN: [id number] DOB: November 04, 1950   Cancelled Treatment:    Reason Eval/Treat Not Completed: Pain limiting ability to participate;Other (comment). RN cleared pt for participation in therapy. Pt received in bed with family present. Pt endorsing 5/10 abdominal pain and that she has been up to the [facility] with BMs 7x today. Pt deferred all therapeutic intervention at this time. Will re-attempt at later date/time.  Doneta Public 10/05/2022, 5:04 PM

## 2022-10-05 NOTE — Progress Notes (Signed)
PT Cancellation Note  Patient Details Name: Kristina Ellis MRN: [id number] DOB: [birth year]   Cancelled Treatment:    Reason Eval/Treat Not Completed: Medical issues which prohibited therapy  Transfer to 259 last night.  HR remains elevated.  Will hold today and continue tomorrow as appropriate.  Chesley Noon 10/05/2022, 11:35 AM

## 2022-10-05 NOTE — Consult Note (Signed)
[place] NOTE       Patient ID: Kristina Ellis MRN: [id number] DOB/AGE: [birth year] 72 y.o.  Admit date: 10/02/2022 Referring Physician Dr. Wendee Beavers  Primary Physician Princella Ion [facility] Primary Cardiologist Dr. Clayborn Bigness Reason for Consultation AF RVR, MV veg  HPI: Kristina Ellis is a 72yoF with a PMH of paroxysmal AF (apixaban, amiodarone) s/p TEE/DCCV 01/2020 [facility]), mod MR, PR, mod-sev TR, mild AR, liver cirrhosis, stage II bladder cancer s/p TURBT, XRT, and chemo, who presented to [facility] ED 10/02/22 with RUQ abdominal pain, nausea and vomiting. Febrile on admission, and CT abd/pelvis & MRCP c/f acute cholecystitis, pending possible IR cholecystostomy tube. Blood cx positive for strep pneumo bacteremia. Cardiology is consulted on hospital day 2 for assistance with her AF RVR & mitral valve veg on TTE.   The patient presented to [facility] after having "excruciating" abdominal pain, nausea and vomiting. Imaging performed on admission c/w acute cholecystitis for which percutaneous cholecystostomy tube was recommended, but patient continues to refuse this, initially wanted to make sure this was ok with her oncologist (who did not have any restrictions from an oncologic aspect).  She converted to atrial fibrillation with RVR and was started on an amiodarone infusion.  At my time of evaluation she is laying flat in bed and says she feels "pretty good."  She felt frustrated on her first couple days of admission as she was questioned about all of the medications she was taking and really does not want to have "her stomach drilled into" re: IR cholecystostomy tube placement.  She denies chest pain, shortness of breath, or palpitations.  No peripheral edema, orthopnea, or PND. She feels as though she's had issues swallowing lately, with the sensation of having to clear her throat or cough up food she has chewed. She remains in AF RVR on telemetry with rates in the low 100s  to 120s.  Blood pressure recently 137/97 and she is comfortable on room air.  TTE this admission showed moderately reduced LVEF at 45-50% with severe LA dilation, and a small vegetation on the mitral valve with moderate MR, severe mitral annular calcifications moderate TR, mild AS.  TEE was recommended for clarification re mitral valve veg by the interpreting physician.  Review of systems complete and found to be negative unless listed above     Past Medical History:  Diagnosis Date   Aortic atherosclerosis ([place])    Arthritis    Atrial fibrillation ([place])    a.) s/p TEE with cardioversion on 02/04/2020. b.) on daily apixaban.   Bladder mass    a.) CT 04/08/2021 --> 3.2 cm intraluminal bladder mass.   Carotid stenosis, bilateral    a.) Doppler 07/04/2020 --> mild; 1-49% stenosis BILATERALLY.   Cholelithiasis    a.) CT 11/06/2020 --> largest measured 4.5 cm   Chronic anticoagulation    a.) Apixaban   Common biliary duct calculus    a.) CT 04/08/2021 --> CBD dilated at 8 mm; 37m calculus within distal CBD.   Coronary artery disease involving native coronary artery of native heart without angina pectoris [id number]  Diastolic dysfunction    a.) TTE 07/04/2020 --> LVEF 60-65%; G1DD.   Hepatic cirrhosis (HCC)    Hepatic steatosis    Hepatitis 1970   History of 2019 novel coronavirus disease (COVID-19) 12/20/2019   History of marijuana use    Hypertension    Hypertensive retinopathy    IDA (iron deficiency anemia)    Mitral stenosis  a.) TEE 04/10/2020 --> mild. b.) TTE 07/04/2020 --> EF 60-65%; mild (mean gradient 5 mmHg).   Moderate pulmonary arterial systolic hypertension ([place]) 12/22/2019   Nephrolithiasis    NSTEMI (non-ST elevated myocardial infarction) ([place]) 07/26/2016   Open-angle glaucoma 09/11/2010   PAH (pulmonary artery hypertension) ([place])    a.) TTE 12/21/2019 --> PASP 44 mmHg.   Uterine fibroid    a.) CT 04/08/2021 --> multiple with largest measuring 7 cm.   Valvular  regurgitation    a.) TTE 09/01/2010 --> trivial to mild pan-valvular. b.) TTE 12/21/2019 --> mild MR and AR; moderate TR. c.) TTE 07/04/2020 --> mild TR; trivial MR and PR.   Vestibular neuronitis     Past Surgical History:  Procedure Laterality Date   BREAST CYST ASPIRATION Left    COLONOSCOPY  2012   ERCP N/A 12/18/2021   Procedure: ENDOSCOPIC RETROGRADE CHOLANGIOPANCREATOGRAPHY (ERCP);  Surgeon: Lucilla Lame, MD;  Location: [facility] ENDOSCOPY;  Service: Endoscopy;  Laterality: N/A;   TEE WITH CARDIOVERSION N/A 02/04/2020   Procedure: TEE WITH CARDIOVERSION; Location: [facility]; Surgeon: Kandis Cocking, MD   TRANSURETHRAL RESECTION OF BLADDER TUMOR N/A 05/15/2021   Procedure: TRANSURETHRAL RESECTION OF BLADDER TUMOR (TURBT);  Surgeon: Billey Co, MD;  Location: [facility] ORS;  Service: Urology;  Laterality: N/A;    Medications Prior to Admission  Medication Sig Dispense Refill Last Dose   amiodarone (PACERONE) 200 MG tablet Take by mouth.   10/02/2022   magnesium oxide (MAG-OX) 400 MG tablet Take 1 tablet by mouth 2 (two) times daily.   Past Month   metoprolol tartrate (LOPRESSOR) 100 MG tablet Take 1 tablet by mouth 2 (two) times daily.   10/02/2022   potassium chloride SA (KLOR-CON M) 20 MEQ tablet Take 1 tablet (20 mEq total) by mouth 2 (two) times daily. 90 tablet 1 [id number]   trolamine salicylate (ASPERCREME) 10 % cream Apply 1 application topically as needed for muscle pain.   Past Month   atorvastatin (LIPITOR) 40 MG tablet Take 40 mg by mouth at bedtime. (Patient not taking: Reported on 08/31/2022)   Not Taking   diltiazem (CARDIZEM CD) 120 MG 24 hr capsule Take 1 capsule (120 mg total) by mouth daily. (Patient not taking: Reported on 08/31/2022) 30 capsule 0 Not Taking   ELIQUIS 5 MG TABS tablet Take 1 tablet (5 mg total) by mouth 2 (two) times daily. 60 tablet 1    metoprolol succinate (TOPROL-XL) 25 MG 24 hr tablet Take 1 tablet (25 mg total) by mouth 2 (two) times daily. Take with or immediately  following a meal. 60 tablet 0    Multiple Vitamin (MULTIVITAMIN WITH MINERALS) TABS tablet Take 1 tablet by mouth daily. (Patient not taking: Reported on 08/31/2022) 90 tablet 0 Not Taking   oxybutynin (DITROPAN-XL) 10 MG 24 hr tablet Take 1 tablet (10 mg total) by mouth daily. (Patient not taking: Reported on 08/31/2022) 30 tablet 11 Not Taking   Social History   Socioeconomic History   Marital status: Single    Spouse name: Not on file   Number of children: 1   Years of education: Not on file   Highest education level: Not on file  Occupational History   Not on file  Tobacco Use   Smoking status: Former    Packs/day: 0.10    Years: 10.00    Total pack years: 1.00    Types: Cigarettes    Passive exposure: Past   Smokeless tobacco: Never   Tobacco comments:  occasional smoker  Vaping Use   Vaping Use: Never used  Substance and Sexual Activity   Alcohol use: Yes    Alcohol/week: 5.0 standard drinks of alcohol    Types: 5 Cans of beer per week    Comment: weekly   Drug use: Not Currently    Types: 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, 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   Sexual activity: Not on file  Other Topics Concern   Not on file  Social History Narrative   Lives with son   Social Determinants of Health   Financial Resource Strain: Low Risk  (06/18/2022)   Overall Financial Resource Strain (CARDIA)    Difficulty of Paying Living Expenses: Not very hard  Food Insecurity: No Food Insecurity (10/03/2022)   Hunger Vital Sign    Worried About Running Out of Food in the Last Year: Never true    Ran Out of Food in the Last Year: Never true  Transportation Needs: No Transportation Needs (10/03/2022)   PRAPARE - Hydrologist (Medical): No    Lack of Transportation (Non-Medical): No  Physical Activity: Inactive (06/18/2022)   Exercise Vital Sign    Days of Exercise per Week: 0 days    Minutes of Exercise per Session: 0 min  Stress: No Stress Concern Present (06/18/2022)    [place]    Feeling of Stress : Only a little  Social Connections: Socially Isolated (06/18/2022)   Social Connection and Isolation Panel [NHANES]    Frequency of Communication with Friends and Family: More than three times a week    Frequency of Social Gatherings with Friends and Family: More than three times a week    Attends Religious Services: Never    Marine scientist or Organizations: No    Attends Archivist Meetings: Never    Marital Status: Never married  Intimate Partner Violence: Not At Risk (10/03/2022)   Humiliation, Afraid, Rape, and Kick questionnaire    Fear of Current or Ex-Partner: No    Emotionally Abused: No    Physically Abused: No    Sexually Abused: No    Family History  Problem Relation Age of Onset   Aneurysm Mother    Colon cancer Father    Lung cancer Brother    Breast cancer Neg Hx       Intake/Output Summary (Last 24 hours) at 10/05/2022 1352 Last data filed at 10/05/2022 1000 Gross per 24 hour  Intake 1149.09 ml  Output 350 ml  Net 799.09 ml    Vitals:   10/05/22 0700 10/05/22 0830 10/05/22 0914 10/05/22 1200  BP: (!) 113/95 (!) 137/97  114/89  Pulse: (!) 120 (!) 120 (!) 115 (!) 118  Resp: '13 20  17  '$ Temp:  97.9 F (36.6 C)    TempSrc:  Oral    SpO2: 100% 100%  99%  Weight:      Height:        PHYSICAL EXAM General: elderly thin black female, in no acute distress. HEENT:  Normocephalic and atraumatic. Neck:  No JVD.  Lungs: Normal respiratory effort on room air. Clear bilaterally to auscultation. No wheezes, crackles, rhonchi. Heart: Tachycardic irregularly irregular . Normal S1 and S2 without gallops or murmurs.  Abdomen: Non-distended appearing.  Msk: Normal strength and tone for age. Extremities: Warm and well perfused. No clubbing, cyanosis.  No peripheral edema.  Neuro: Alert and oriented X 3. Psych:  Answers questions  appropriately.    Labs: Basic Metabolic Panel: Recent Labs    10/04/22 0449 10/05/22 0516  NA 135 131*  K 3.8 3.8  CL 108 105  CO2 19* 19*  GLUCOSE 97 90  BUN 12 17  CREATININE 0.89 1.00  CALCIUM 7.3* 7.5*  MG 2.2 2.0  PHOS 3.5 2.7   Liver Function Tests: Recent Labs    10/04/22 0449 10/05/22 0516  AST 27 37  ALT 10 14  ALKPHOS 39 48  BILITOT 1.5* 1.5*  PROT 5.6* 5.7*  ALBUMIN 1.6* 1.7*   Recent Labs    10/02/22 1833 10/03/22 0844  LIPASE 55* 44   CBC: Recent Labs    10/03/22 0844 10/04/22 0449 10/05/22 0516  WBC 10.0 9.1 5.4  NEUTROABS 9.0* 8.3*  --   HGB 10.7* 10.2* 10.0*  HCT 32.2* 30.2* 30.6*  MCV 104.5* 104.9* 106.3*  PLT 151 175 197   Cardiac Enzymes: Recent Labs    10/02/22 1833 10/02/22 2058  TROPONINIHS 17 25*   BNP: Recent Labs    10/04/22 1503  BNP 285.4*   D-Dimer: No results for input(s): "DDIMER" in the last 72 hours. Hemoglobin A1C: No results for input(s): "HGBA1C" in the last 72 hours. Fasting Lipid Panel: No results for input(s): "CHOL", "HDL", "LDLCALC", "TRIG", "CHOLHDL", "LDLDIRECT" in the last 72 hours. Thyroid Function Tests: Recent Labs    10/02/22 1833  TSH 8.816*   Anemia Panel: No results for input(s): "VITAMINB12", "FOLATE", "FERRITIN", "TIBC", "IRON", "RETICCTPCT" in the last 72 hours.   Radiology: ECHOCARDIOGRAM COMPLETE  Result Date: 10/04/2022    ECHOCARDIOGRAM REPORT   Patient Name:   Kristina Ellis Date of Exam: 10/04/2022 Medical Rec #:  [id number]        Height:       59.0 in Accession #:    [id number]       Weight:       120.1 lb Date of Birth:  03-23-1951         BSA:          1.485 m Patient Age:    42 years         BP:           131/120 mmHg Patient Gender: F                HR:           113 bpm. Exam Location:  [facility] Procedure: 2D Echo, Cardiac Doppler and Color Doppler Indications:     Bacteremia  History:         Patient has prior history of Echocardiogram examinations, most                  recent 08/10/2021. CAD  and Previous Myocardial Infarction,                  Pulmonary HTN, Arrythmias:Atrial Fibrillation;                  Signs/Symptoms:Bacteremia and Chest Pain. Bladder CA.  Sonographer:     Wenda Low Referring Phys:  [id number] Charlesetta Ivory GONFA Diagnosing Phys: Kathlyn Sacramento MD IMPRESSIONS  1. Left ventricular ejection fraction, by estimation, is 45 to 50%. The left ventricle has mildly decreased function. Left ventricular endocardial border not optimally defined to evaluate regional wall motion. There is moderate left ventricular hypertrophy. Left ventricular diastolic parameters are indeterminate.  2. Right ventricular systolic function is normal. The right ventricular size is normal. There is moderately  elevated pulmonary artery systolic pressure.  3. Left atrial size was severely dilated.  4. Small vegetation on the mitral valve.  5. The mitral valve is abnormal. Moderate mitral valve regurgitation. No evidence of mitral stenosis. Severe mitral annular calcification.  6. Tricuspid valve regurgitation is moderate.  7. The aortic valve is normal in structure. Aortic valve regurgitation is trivial. Mild aortic valve stenosis. Aortic valve area, by VTI measures 1.43 cm. Aortic valve mean gradient measures 6.0 mmHg. Conclusion(s)/Recommendation(s): Findings concerning for mitral valve vegetation, would recommend a Transesophageal Echocardiogram for clarification. FINDINGS  Left Ventricle: Left ventricular ejection fraction, by estimation, is 45 to 50%. The left ventricle has mildly decreased function. Left ventricular endocardial border not optimally defined to evaluate regional wall motion. The left ventricular internal cavity size was normal in size. There is moderate left ventricular hypertrophy. Left ventricular diastolic parameters are indeterminate. Right Ventricle: The right ventricular size is normal. No increase in right ventricular wall thickness. Right ventricular systolic function is normal. There is  moderately elevated pulmonary artery systolic pressure. The tricuspid regurgitant velocity is 3.16 m/s, and with an assumed right atrial pressure of 8 mmHg, the estimated right ventricular systolic pressure is [id number] mmHg. Left Atrium: Left atrial size was severely dilated. Right Atrium: Right atrial size was normal in size. Pericardium: There is no evidence of pericardial effusion. Mitral Valve: The mitral valve is abnormal. There is moderate thickening of the mitral valve leaflet(s). There is moderate calcification of the mitral valve leaflet(s). Severe mitral annular calcification. Moderate mitral valve regurgitation. No evidence  of mitral valve stenosis. MV peak gradient, 16.2 mmHg. The mean mitral valve gradient is 7.0 mmHg. Tricuspid Valve: The tricuspid valve is normal in structure. Tricuspid valve regurgitation is moderate . No evidence of tricuspid stenosis. Aortic Valve: The aortic valve is normal in structure. Aortic valve regurgitation is trivial. Mild aortic stenosis is present. Aortic valve mean gradient measures 6.0 mmHg. Aortic valve peak gradient measures 12.8 mmHg. Aortic valve area, by VTI measures  1.43 cm. Pulmonic Valve: The pulmonic valve was normal in structure. Pulmonic valve regurgitation is trivial. No evidence of pulmonic stenosis. Aorta: The aortic root is normal in size and structure. Venous: The inferior vena cava was not well visualized. IAS/Shunts: No atrial level shunt detected by color flow Doppler.  LEFT VENTRICLE PLAX 2D LVIDd:         3.50 cm LVIDs:         2.70 cm LV PW:         1.20 cm LV IVS:        1.30 cm LVOT diam:     1.90 cm LV SV:         39 LV SV Index:   27 LVOT Area:     2.84 cm  RIGHT VENTRICLE RV Basal diam:  3.20 cm RV Mid diam:    2.40 cm RV S prime:     13.50 cm/s LEFT ATRIUM             Index        RIGHT ATRIUM           Index LA diam:        5.00 cm 3.37 cm/m   RA Area:     14.50 cm LA Vol (A2C):   69.7 ml 46.93 ml/m  RA Volume:   37.90 ml  25.52 ml/m LA  Vol (A4C):   86.6 ml 58.31 ml/m LA Biplane Vol: 79.4 ml 53.46 ml/m  AORTIC VALVE  PULMONIC VALVE AV Area (Vmax):    1.71 cm      PV Vmax:       1.01 m/s AV Area (Vmean):   1.53 cm      PV Peak grad:  4.1 mmHg AV Area (VTI):     1.43 cm AV Vmax:           179.00 cm/s AV Vmean:          112.000 cm/s AV VTI:            0.275 m AV Peak Grad:      12.8 mmHg AV Mean Grad:      6.0 mmHg LVOT Vmax:         108.00 cm/s LVOT Vmean:        60.300 cm/s LVOT VTI:          0.139 m LVOT/AV VTI ratio: 0.51  AORTA Ao Root diam: 3.20 cm Ao Asc diam:  2.70 cm MITRAL VALVE                TRICUSPID VALVE MV Area (PHT): 2.91 cm     TR Peak grad:   39.9 mmHg MV Area VTI:   1.68 cm     TR Vmax:        316.00 cm/s MV Peak grad:  16.2 mmHg MV Mean grad:  7.0 mmHg     SHUNTS MV Vmax:       2.01 m/s     Systemic VTI:  0.14 m MV Vmean:      116.0 cm/s   Systemic Diam: 1.90 cm MV Decel Time: 261 msec MV E velocity: 159.00 cm/s Kathlyn Sacramento MD Electronically signed by Kathlyn Sacramento MD Signature Date/Time: 10/04/2022/5:20:56 PM    Final    DG Chest Port 1 View  Result Date: 10/04/2022 CLINICAL DATA:  Shortness of breath. EXAM: PORTABLE CHEST 1 VIEW COMPARISON:  Chest radiograph and CT 10/02/2022 FINDINGS: The patient is rotated to the right. The cardiac silhouette is borderline enlarged. There is extensive mitral annular calcification. Lung volumes are low with mild interstitial densities in the lung bases. No overt pulmonary edema, lobar consolidation, sizeable pleural effusion, or pneumothorax is identified. Advanced bilateral glenohumeral arthropathy is again noted. IMPRESSION: Low lung volumes with mild bibasilar opacities which could reflect atelectasis or early infection. Electronically Signed   By: Logan Bores M.D.   On: 10/04/2022 15:15   MR ABDOMEN MRCP W WO CONTAST  Result Date: 10/03/2022 CLINICAL DATA:  72 year old female with suspected biliary obstruction. EXAM: MRI ABDOMEN WITHOUT AND WITH  CONTRAST (INCLUDING MRCP) TECHNIQUE: Multiplanar multisequence MR imaging of the abdomen was performed both before and after the administration of intravenous contrast. Heavily T2-weighted images of the biliary and pancreatic ducts were obtained, and three-dimensional MRCP images were rendered by post processing. CONTRAST:  67m GADAVIST GADOBUTROL 1 MMOL/ML IV SOLN COMPARISON:  Abdominal MRI 04/11/2022. Right upper quadrant abdominal ultrasound 10/03/2022. CT of the abdomen and pelvis 10/02/2022. FINDINGS: Lower chest: Small right pleural effusion lying dependently. Hepatobiliary: Diffuse but heterogeneous loss of signal intensity throughout the hepatic parenchyma on out of phase dual echo images, indicative of a background of heterogeneous hepatic steatosis. Liver has a shrunken appearance and very nodular contour, indicative of underlying cirrhosis. No definite suspicious cystic or solid hepatic lesions are confidently identified. No intra or extrahepatic biliary ductal dilatation noted on MRCP images. Common bile duct measures 6 mm in the porta hepatis. No filling defect within the common bile duct to  suggest choledocholithiasis. However, there is heterogeneous signal intensity within the lumen of the gallbladder, most notable for a large signal void which measures up to 4.5 x 3.0 cm, compatible with a large stone. Gallbladder wall is irregularly thickened and edematous measuring up to 1 cm, with surrounding pericholecystic fluid and inflammatory changes. Pancreas: No pancreatic mass. No pancreatic ductal dilatation. No pancreatic or peripancreatic fluid collections or inflammatory changes. Spleen:  Unremarkable. Adrenals/Urinary Tract: Bilateral kidneys and bilateral adrenal glands are normal in appearance. No hydroureteronephrosis in the visualized portions of the abdomen. Stomach/Bowel: Visualized portions are unremarkable. Vascular/Lymphatic: No aneurysm identified in the visualized abdominal vasculature. No  lymphadenopathy noted in the abdomen. Other: Trace volume of ascites. Inflammatory changes centered around the gallbladder in the right upper quadrant of the abdomen. Incidental imaging of the pelvis demonstrates an enlarged uterus with multiple lesions, presumably fibroids. Musculoskeletal: No aggressive appearing osseous lesions are noted in the visualized portions of the skeleton. IMPRESSION: 1. Cholelithiasis with imaging findings highly concerning for acute cholecystitis. Surgical consultation is recommended. 2. No choledocholithiasis or findings of biliary tract obstruction. 3. Hepatic cirrhosis and heterogeneous hepatic steatosis. No aggressive appearing hepatic lesion noted at this time. 4. Trace volume of ascites. 5. Small right pleural effusion lying dependently. Electronically Signed   By: Vinnie Langton M.D.   On: 10/03/2022 05:55   MR 3D Recon At Scanner  Result Date: 10/03/2022 CLINICAL DATA:  72 year old female with suspected biliary obstruction. EXAM: MRI ABDOMEN WITHOUT AND WITH CONTRAST (INCLUDING MRCP) TECHNIQUE: Multiplanar multisequence MR imaging of the abdomen was performed both before and after the administration of intravenous contrast. Heavily T2-weighted images of the biliary and pancreatic ducts were obtained, and three-dimensional MRCP images were rendered by post processing. CONTRAST:  47m GADAVIST GADOBUTROL 1 MMOL/ML IV SOLN COMPARISON:  Abdominal MRI 04/11/2022. Right upper quadrant abdominal ultrasound 10/03/2022. CT of the abdomen and pelvis 10/02/2022. FINDINGS: Lower chest: Small right pleural effusion lying dependently. Hepatobiliary: Diffuse but heterogeneous loss of signal intensity throughout the hepatic parenchyma on out of phase dual echo images, indicative of a background of heterogeneous hepatic steatosis. Liver has a shrunken appearance and very nodular contour, indicative of underlying cirrhosis. No definite suspicious cystic or solid hepatic lesions are  confidently identified. No intra or extrahepatic biliary ductal dilatation noted on MRCP images. Common bile duct measures 6 mm in the porta hepatis. No filling defect within the common bile duct to suggest choledocholithiasis. However, there is heterogeneous signal intensity within the lumen of the gallbladder, most notable for a large signal void which measures up to 4.5 x 3.0 cm, compatible with a large stone. Gallbladder wall is irregularly thickened and edematous measuring up to 1 cm, with surrounding pericholecystic fluid and inflammatory changes. Pancreas: No pancreatic mass. No pancreatic ductal dilatation. No pancreatic or peripancreatic fluid collections or inflammatory changes. Spleen:  Unremarkable. Adrenals/Urinary Tract: Bilateral kidneys and bilateral adrenal glands are normal in appearance. No hydroureteronephrosis in the visualized portions of the abdomen. Stomach/Bowel: Visualized portions are unremarkable. Vascular/Lymphatic: No aneurysm identified in the visualized abdominal vasculature. No lymphadenopathy noted in the abdomen. Other: Trace volume of ascites. Inflammatory changes centered around the gallbladder in the right upper quadrant of the abdomen. Incidental imaging of the pelvis demonstrates an enlarged uterus with multiple lesions, presumably fibroids. Musculoskeletal: No aggressive appearing osseous lesions are noted in the visualized portions of the skeleton. IMPRESSION: 1. Cholelithiasis with imaging findings highly concerning for acute cholecystitis. Surgical consultation is recommended. 2. No choledocholithiasis or findings of biliary tract  obstruction. 3. Hepatic cirrhosis and heterogeneous hepatic steatosis. No aggressive appearing hepatic lesion noted at this time. 4. Trace volume of ascites. 5. Small right pleural effusion lying dependently. Electronically Signed   By: Vinnie Langton M.D.   On: 10/03/2022 05:55   US ABDOMEN LIMITED RUQ (LIVER/GB)  Result Date:  10/03/2022 CLINICAL DATA:  Abdominal pain. EXAM: ULTRASOUND ABDOMEN LIMITED RIGHT UPPER QUADRANT COMPARISON:  April 11, 2022 FINDINGS: Gallbladder: Large echogenic gallstones are seen within the lumen of a distended gallbladder (the largest measures approximately 3.0 cm). This is seen on the prior study. Stable gallbladder wall thickening (5.4 mm) and pericholecystic fluid are also noted. The presence or absence of a sonographic Percell Miller sign was not provided by the sonographer. Common bile duct: Diameter: 10.1 mm (measured 9.2 mm on the prior study) Liver: A 1.0 cm x 1.1 cm x 1.0 cm hypoechoic area is seen within the left lobe of the liver. No flow is noted within this region on color Doppler evaluation. The liver parenchyma is nodular in contour and diffusely increased in echogenicity. Portal vein is patent on color Doppler imaging with normal direction of blood flow towards the liver. Other: There is a mild to moderate amount of perihepatic fluid. IMPRESSION: 1. Cholelithiasis and stable gallbladder wall thickening, which may be secondary to ascites. Sequelae associated with acute cholecystitis cannot be excluded. 2. Hepatic steatosis and hepatic cirrhosis with additional findings that may represent a hepatic cyst within the left lobe. 3. Ascites. Electronically Signed   By: Virgina Norfolk M.D.   On: 10/03/2022 00:08   CT CHEST ABDOMEN PELVIS W CONTRAST  Result Date: 10/02/2022 CLINICAL DATA:  Sepsis, abdominal pain out of proportion, lactic acidosis. Bladder cancer. EXAM: CT CHEST, ABDOMEN, AND PELVIS WITH CONTRAST TECHNIQUE: Multidetector CT imaging of the chest, abdomen and pelvis was performed following the standard protocol during bolus administration of intravenous contrast. RADIATION DOSE REDUCTION: This exam was performed according to the departmental dose-optimization program which includes automated exposure control, adjustment of the mA and/or kV according to patient size and/or use of  iterative reconstruction technique. CONTRAST:  119m OMNIPAQUE IOHEXOL 300 MG/ML  SOLN COMPARISON:  PET CT 08/26/2022, CT abdomen pelvis 04/11/2022 FINDINGS: CT CHEST FINDINGS Cardiovascular: Mild coronary artery calcification. Extensive calcification of the mitral valve annulus. Global cardiac size within normal limits. No pericardial effusion. Central pulmonary arteries are enlarged in keeping with changes of pulmonary arterial hypertension. Moderate atherosclerotic calcification within the thoracic aorta. No aortic aneurysm. Mediastinum/Nodes: Pathologic right paratracheal lymph node demonstrating hypermetabolism on prior PET CT examination is stable measuring 15 mm in short axis diameter at axial image # 17/2. No new pathologic thoracic adenopathy. Esophagus unremarkable. Visualized thyroid unremarkable. Lungs/Pleura: Interval development of small right pleural effusion. Interval development of trace interstitial pulmonary edema, possibly cardiogenic in nature. No pneumothorax. No central obstructing lesion. 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 Stable subacute fractures of the superior endplate of [id number] and the medial right eleventh and twelfth ribs proximal to the costotransverse junction demonstrating callus is again noted. No acute bone abnormality. No focal lytic lesion identified. CT ABDOMEN PELVIS FINDINGS Hepatobiliary: Cholelithiasis again noted. The gallbladder is distended and a gallstone is seen impacted within the gallbladder neck, similar to prior examination. There has, however, developed increasing pericholecystic infiltration which may relate to inflammatory changes the gallbladder, as can be seen with calculus cholecystitis, or progressive ascites. Cirrhosis. No enhancing intrahepatic mass. No intra or extrahepatic biliary ductal dilation. Pancreas: Unremarkable  Spleen: Unremarkable Adrenals/Urinary Tract: The adrenal  glands are unremarkable. The kidneys are normal in size and position. Mild left hydronephrosis and hydroureter to the level of the left ureterovesicular junction is stable since prior PET CT examination. No hydronephrosis on the right. No intrarenal or ureteral calculi. No enhancing intrarenal masses. The bladder is unremarkable. Stomach/Bowel: There is subtle asymmetric bowel wall thickening involving the hepatic flexure of the colon adjacent to the gallbladder suggesting an adjacent inflammatory process. The stomach, small bowel, and large bowel are otherwise unremarkable. Appendix normal. Mild ascites is present, new since prior PET CT examination. No free intraperitoneal gas. Vascular/Lymphatic: Extensive aortoiliac atherosclerotic calcification. No aortic aneurysm. Stable left periaortic adenopathy since prior PET CT examination where this demonstrate no significant metabolic activity and likely represents the residua of treated disease. No new pathologic adenopathy within the abdomen and pelvis. Reproductive: Multiple hypoenhancing masses are again seen within the uterus most in keeping with multiple uterine fibroids. The pelvic organs are otherwise unremarkable. Other: Increasing diffuse subcutaneous body wall edema in keeping with progressive anasarca. No abdominal wall hernia. Musculoskeletal: Degenerative changes are seen within the lumbar spine. No acute bone abnormality. No lytic bone lesion. IMPRESSION: 1. Interval development of small right pleural effusion, trace interstitial pulmonary edema, mild ascites, and progressive diffuse subcutaneous body wall edema in keeping with progressive anasarca and/or cardiogenic failure. 2. Cholelithiasis with gallstone impacted within the gallbladder neck. Interval development of pericholecystic infiltration which may relate to inflammatory changes the gallbladder, as can be seen with calculus cholecystitis, or progressive ascites. Subtle inflammatory change  involving the adjacent hepatic flexure of the colon, however, suggests acute cholecystitis. Correlation with liver enzymes and possible right upper quadrant sonography may be helpful for further evaluation. 3. Cirrhosis. 4. Stable mild left hydronephrosis and hydroureter to the level of the left ureterovesicular junction, possibly related to the patient's known underlying bladder malignancy. No mass lesion identified, however. 5. Stable subacute fractures of the superior endplate of 624THL anteriorly and the medial right eleventh and twelfth ribs proximal to the costotransverse junction. No acute bone abnormality identified. 6. Mild coronary artery calcification. 7.  Aortic Atherosclerosis (ICD10-I70.0). Electronically Signed   By: Fidela Salisbury M.D.   On: 10/02/2022 21:53   DG Chest Port 1 View  Result Date: 10/02/2022 CLINICAL DATA:  Sepsis EXAM: PORTABLE CHEST 1 VIEW COMPARISON:  04/11/2022 FINDINGS: Lungs are well expanded, symmetric, and clear. No pneumothorax or pleural effusion. Cardiac size within normal limits. Extensive mitral valve annular calcifications again noted. Pulmonary vascularity is normal. Osseous structures are age-appropriate. Advanced degenerative changes noted within the shoulders bilaterally. No acute bone abnormality. IMPRESSION: 1. No active disease. Electronically Signed   By: Fidela Salisbury M.D.   On: 10/02/2022 19:38    02/25/22 echo NORMAL LEFT VENTRICULAR SYSTOLIC FUNCTION   WITH MILD LVH  NORMAL RIGHT VENTRICULAR SYSTOLIC FUNCTION  NO VALVULAR STENOSIS  MODERATE MR, PR  MODERATE to SEVERE TR  MILD AR  EF 50-55%   02/22/2022 lexiscan myoview   Moderately abnormal myocardial perfusion scan there is  evidence of inferior borderline defect overall left ventricular function  is moderate to severely depressed 25 to 30% no clear evidence of  reversible ischemia this is a intermediate scan   TELEMETRY reviewed by me (LT) 10/05/2022 : AF RVR 100s-130s  EKG reviewed by me:  AF LBBB 151  Data reviewed by me (LT) 10/05/2022: ed note, admission H&P, surgery and hospitalist progress note, last 24h vitals tele labs imaging I/O  Principal Problem:   Cholecystitis Active Problems:   Bladder cancer (HCC)   Elevated troponin level not due myocardial infarction   Coronary artery disease involving native coronary artery of native heart without angina pectoris   Atrial fibrillation with rapid ventricular response (HCC)   Severe sepsis ([place])   Bacteremia due to Streptococcus   Goals of care, counseling/discussion   DNR (do not resuscitate)   Heart failure with mildly reduced ejection fraction ([place])   Mitral valve vegetation    ASSESSMENT AND PLAN:  Kristina Ellis is a 28yoF with a PMH of paroxysmal AF (apixaban, amiodarone) s/p TEE/DCCV 01/2020 [facility]), mod MR, PR, mod-sev TR, mild AR, liver cirrhosis, stage II bladder cancer s/p TURBT, XRT, and chemo, who presented to [facility] ED 10/02/22 with RUQ abdominal pain, nausea and vomiting. Febrile on admission, and CT abd/pelvis & MRCP c/f acute cholecystitis, pending possible IR cholecystostomy tube. Blood cx positive for strep pneumo bacteremia. Cardiology is consulted on hospital day 2 for assistance with her AF RVR & mitral valve veg on TTE.   # acute cholecystitis Presented with severe RUQ pain, nausea, and vomiting. MRCP with concern for acute cholecystitis for which percutaneous cholecystostomy drain was recommended, although the patient has been refusing this.  Surgery following.  Remains on IV antibiotics  # strep pneumo bacteremia # MV veg on TTE On IV Zosyn.  Small vegetation seen on surface echo for which the interpreting physician recommended a TEE for further clarification the patient reports difficulty swallowing over the past several months and a sensation of food getting stuck in her throat.  She is hesitant to undergo TEE because of this, may not be a good candidate if she does have dysphagia/?stricture.  - will  continue discussions re TEE with the patient, may need to go ahead and continue prolonged ABX for treatment   # paroxysmal AF RVR  In the setting of acute cholecystitis, fever, and severe pain. Rate better controlled today in the 100s-120s.  -continue IV amiodarone for today, not a great long term medication with hx of cirrhosis -start metoprolol tartrate '25mg'$  PO q6h -start cardizem PO '30mg'$  q6h -change eliquis to heparin in the event patient needs a surgical/IR intervention. 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  EF this admission with slight reduction compared to prior from 02/2022 with EF of 50-55%. GDMT with metoprolol, limited by relative hypotension. Consider ARB, MRA. Defer SGLT2i with hx bladder ca.   This patient's plan of care was discussed and created with Dr. Clayborn Bigness and he is in agreement.  Signed: Tristan Schroeder , PA-C 10/05/2022, 1:52 PM [facility]

## 2022-10-05 NOTE — IPAL (Signed)
  Interdisciplinary Goals of Care Family Meeting   Date carried out: 10/05/2022  Location of the meeting: Bedside  Member's involved: Physician  Durable Power of Attorney or acting medical decision maker: Patient  Discussion: We discussed goals of care for Kristina Ellis .   Extensive goals of care discussion with focus on CODE STATUS.  Patient is awake and oriented x 4 with good insight.  We have discussed pros and cons of CPR in light of his comorbidity including but not limited to stage IV bladder cancer, liver cirrhosis, bacteremia, acute calculus cholecystitis and possible endocarditis.  She is deemed to be not a candidate for surgical intervention or percutaneous cholecystostomy.  She is also not interested in surgical intervention.  Patient clearly expressed that she is not interested in cardiopulmonary resuscitation in an event of cardiopulmonary arrest which is reasonable given her underlying condition.  She is okay with continuing IV antibiotics and noninvasive management.  Palliative care has been consulted for further goals of care discussion.  Changed CODE STATUS to DNR/DNI.  Updated patient's RN.  Code status:   Code Status: DNR   Disposition: Continue current acute care with change of CODE STATUS to DNR and DNI.  Time spent for the meeting: 25 minutes.    Mercy Riding, MD  10/05/2022, 9:47 AM

## 2022-10-05 NOTE — Care Management Important Message (Signed)
Important Message  Patient Details  Name: INIYAH PINERA MRN: [id number] Date of Birth: 07/22/1951   Medicare Important Message Given:  N/A - LOS <3 / Initial given by admissions     [name] 10/05/2022, 10:58 AM

## 2022-10-05 NOTE — Progress Notes (Signed)
Date of Admission:  10/02/2022    ID: Kristina Ellis is a 72 y.o. female Principal Problem:   Cholecystitis Active Problems:   Bladder cancer ([place])   Elevated troponin level not due myocardial infarction   Coronary artery disease involving native coronary artery of native heart without angina pectoris   Atrial fibrillation with rapid ventricular response (HCC)   Severe sepsis (HCC)   Bacteremia due to Streptococcus   Goals of care, counseling/discussion   DNR (do not resuscitate)   Heart failure with mildly reduced ejection fraction (HCC)   Mitral valve vegetation    Subjective: Says pain abdomen improving  Medications:   diltiazem  30 mg Oral Q6H   metoprolol tartrate  25 mg Oral Q6H   midodrine  5 mg Oral TID WC    Objective: Vital signs in last 24 hours: Temp:  [97.9 F (36.6 C)-98 F (36.7 C)] 97.9 F (36.6 C) (02/27 0830) Pulse Rate:  [100-152] 118 (02/27 1200) Resp:  [10-25] 17 (02/27 1200) BP: (104-137)/(72-97) 114/89 (02/27 1200) SpO2:  [95 %-100 %] 99 % (02/27 1200)    PHYSICAL EXAM:  General: Alert, cooperative, no distress, appears stated age.  Head: Normocephalic, without obvious abnormality, atraumatic. Eyes: Conjunctivae clear, anicteric sclerae. Pupils are equal ENT Nares normal. No drainage or sinus tenderness. Lips, mucosa, and tongue normal. No Thrush Neck: Supple, symmetrical, no adenopathy, thyroid: non tender no carotid bruit and no JVD. Back: No CVA tenderness. Lungs: Clear to auscultation bilaterally. No Wheezing or Rhonchi. No rales. Heart: Regular rate and rhythm, no murmur, rub or gallop. Abdomen: tender upper abdomen Extremities: atraumatic, no cyanosis. No edema. No clubbing Skin: No rashes or lesions. Or bruising Lymph: Cervical, supraclavicular normal. Neurologic: Grossly non-focal  Lab Results Recent Labs    10/04/22 0449 10/05/22 0516  WBC 9.1 5.4  HGB 10.2* 10.0*  HCT 30.2* 30.6*  NA 135 131*  K 3.8 3.8  CL 108  105  CO2 19* 19*  BUN 12 17  CREATININE 0.89 1.00   Liver Panel Recent Labs    10/04/22 0449 10/05/22 0516  PROT 5.6* 5.7*  ALBUMIN 1.6* 1.7*  AST 27 37  ALT 10 14  ALKPHOS 39 48  BILITOT 1.5* 1.5*   Sedimentation Rate No results for input(s): "ESRSEDRATE" in the last 72 hours. C-Reactive Protein No results for input(s): "CRP" in the last 72 hours.  Microbiology: [facility]- strep pneumo Studies/Results: ECHOCARDIOGRAM COMPLETE  Result Date: 10/04/2022    ECHOCARDIOGRAM REPORT   Patient Name:   Kristina Ellis Date of Exam: 10/04/2022 Medical Rec #:  [id number]        Height:       59.0 in Accession #:    [id number]       Weight:       120.1 lb Date of Birth:  1951/04/01         BSA:          1.485 m Patient Age:    25 years         BP:           131/120 mmHg Patient Gender: F                HR:           113 bpm. Exam Location:  [facility] Procedure: 2D Echo, Cardiac Doppler and Color Doppler Indications:     Bacteremia  History:         Patient has prior history of Echocardiogram  examinations, most                  recent 08/10/2021. CAD and Previous Myocardial Infarction,                  Pulmonary HTN, Arrythmias:Atrial Fibrillation;                  Signs/Symptoms:Bacteremia and Chest Pain. Bladder CA.  Sonographer:     Wenda Low Referring Phys:  [id number] Charlesetta Ivory GONFA Diagnosing Phys: Kathlyn Sacramento MD IMPRESSIONS  1. Left ventricular ejection fraction, by estimation, is 45 to 50%. The left ventricle has mildly decreased function. Left ventricular endocardial border not optimally defined to evaluate regional wall motion. There is moderate left ventricular hypertrophy. Left ventricular diastolic parameters are indeterminate.  2. Right ventricular systolic function is normal. The right ventricular size is normal. There is moderately elevated pulmonary artery systolic pressure.  3. Left atrial size was severely dilated.  4. Small vegetation on the mitral valve.  5. The mitral valve is abnormal.  Moderate mitral valve regurgitation. No evidence of mitral stenosis. Severe mitral annular calcification.  6. Tricuspid valve regurgitation is moderate.  7. The aortic valve is normal in structure. Aortic valve regurgitation is trivial. Mild aortic valve stenosis. Aortic valve area, by VTI measures 1.43 cm. Aortic valve mean gradient measures 6.0 mmHg. Conclusion(s)/Recommendation(s): Findings concerning for mitral valve vegetation, would recommend a Transesophageal Echocardiogram for clarification. FINDINGS  Left Ventricle: Left ventricular ejection fraction, by estimation, is 45 to 50%. The left ventricle has mildly decreased function. Left ventricular endocardial border not optimally defined to evaluate regional wall motion. The left ventricular internal cavity size was normal in size. There is moderate left ventricular hypertrophy. Left ventricular diastolic parameters are indeterminate. Right Ventricle: The right ventricular size is normal. No increase in right ventricular wall thickness. Right ventricular systolic function is normal. There is moderately elevated pulmonary artery systolic pressure. The tricuspid regurgitant velocity is 3.16 m/s, and with an assumed right atrial pressure of 8 mmHg, the estimated right ventricular systolic pressure is [id number] mmHg. Left Atrium: Left atrial size was severely dilated. Right Atrium: Right atrial size was normal in size. Pericardium: There is no evidence of pericardial effusion. Mitral Valve: The mitral valve is abnormal. There is moderate thickening of the mitral valve leaflet(s). There is moderate calcification of the mitral valve leaflet(s). Severe mitral annular calcification. Moderate mitral valve regurgitation. No evidence  of mitral valve stenosis. MV peak gradient, 16.2 mmHg. The mean mitral valve gradient is 7.0 mmHg. Tricuspid Valve: The tricuspid valve is normal in structure. Tricuspid valve regurgitation is moderate . No evidence of tricuspid stenosis.  Aortic Valve: The aortic valve is normal in structure. Aortic valve regurgitation is trivial. Mild aortic stenosis is present. Aortic valve mean gradient measures 6.0 mmHg. Aortic valve peak gradient measures 12.8 mmHg. Aortic valve area, by VTI measures  1.43 cm. Pulmonic Valve: The pulmonic valve was normal in structure. Pulmonic valve regurgitation is trivial. No evidence of pulmonic stenosis. Aorta: The aortic root is normal in size and structure. Venous: The inferior vena cava was not well visualized. IAS/Shunts: No atrial level shunt detected by color flow Doppler.  LEFT VENTRICLE PLAX 2D LVIDd:         3.50 cm LVIDs:         2.70 cm LV PW:         1.20 cm LV IVS:        1.30 cm LVOT diam:  1.90 cm LV SV:         39 LV SV Index:   27 LVOT Area:     2.84 cm  RIGHT VENTRICLE RV Basal diam:  3.20 cm RV Mid diam:    2.40 cm RV S prime:     13.50 cm/s LEFT ATRIUM             Index        RIGHT ATRIUM           Index LA diam:        5.00 cm 3.37 cm/m   RA Area:     14.50 cm LA Vol (A2C):   69.7 ml 46.93 ml/m  RA Volume:   37.90 ml  25.52 ml/m LA Vol (A4C):   86.6 ml 58.31 ml/m LA Biplane Vol: 79.4 ml 53.46 ml/m  AORTIC VALVE                     PULMONIC VALVE AV Area (Vmax):    1.71 cm      PV Vmax:       1.01 m/s AV Area (Vmean):   1.53 cm      PV Peak grad:  4.1 mmHg AV Area (VTI):     1.43 cm AV Vmax:           179.00 cm/s AV Vmean:          112.000 cm/s AV VTI:            0.275 m AV Peak Grad:      12.8 mmHg AV Mean Grad:      6.0 mmHg LVOT Vmax:         108.00 cm/s LVOT Vmean:        60.300 cm/s LVOT VTI:          0.139 m LVOT/AV VTI ratio: 0.51  AORTA Ao Root diam: 3.20 cm Ao Asc diam:  2.70 cm MITRAL VALVE                TRICUSPID VALVE MV Area (PHT): 2.91 cm     TR Peak grad:   39.9 mmHg MV Area VTI:   1.68 cm     TR Vmax:        316.00 cm/s MV Peak grad:  16.2 mmHg MV Mean grad:  7.0 mmHg     SHUNTS MV Vmax:       2.01 m/s     Systemic VTI:  0.14 m MV Vmean:      116.0 cm/s   Systemic Diam:  1.90 cm MV Decel Time: 261 msec MV E velocity: 159.00 cm/s Kathlyn Sacramento MD Electronically signed by Kathlyn Sacramento MD Signature Date/Time: 10/04/2022/5:20:56 PM    Final    DG Chest Port 1 View  Result Date: 10/04/2022 CLINICAL DATA:  Shortness of breath. EXAM: PORTABLE CHEST 1 VIEW COMPARISON:  Chest radiograph and CT 10/02/2022 FINDINGS: The patient is rotated to the right. The cardiac silhouette is borderline enlarged. There is extensive mitral annular calcification. Lung volumes are low with mild interstitial densities in the lung bases. No overt pulmonary edema, lobar consolidation, sizeable pleural effusion, or pneumothorax is identified. Advanced bilateral glenohumeral arthropathy is again noted. IMPRESSION: Low lung volumes with mild bibasilar opacities which could reflect atelectasis or early infection. Electronically Signed   By: Logan Bores M.D.   On: 10/04/2022 15:15    Impression/recommendation Strep pneumo bacteremia Echo Questions mitral valve vegetation Patient does not want TEE-  so will treat her with 4-6 weeks of IV antibiotic- on discharge it will be ceftriaxone   Cholelithiasis and acute cholecystitis- will need Percutaneous cholecystostomy and bile sent for culture Patient has refused any sort of intervention.  Followed by surgeon  Zosyn changed to unasyn     Afib on amiodarone   progressive bladder cancer on Keytruda. ? Mildly elevated bilirubin   Normal kidney function though CT scan showed mild left show hydronephrosis and hydroureter due to the bladder cancer  Discussed the management with patient and care team

## 2022-10-05 NOTE — Progress Notes (Addendum)
[facility] SURGICAL PROGRESS NOTE (cpt [phone])  Hospital Day(s): 3.   Interval History: Patient seen and examined. Overnight went into Atrial fibrillation requiring transfer to 2A and now on Amiodarone drip. Still in Afib this morning with rate 110-120 while in the room.  Patient reports she continues to have abdominal pain but reports this is improving and now more of a soreness. Right is still worse than left abdomen. No fever, chills, nausea, emesis. She is without leukocytosis; WBC 5.4K. Hgb stable at 10.0. Renal function normal; sCr - 1.00; UO - 350 ccs. No electrolyte derangements. Mild hyperbilirubinemia; now improving at 1.5 (from 2.0). INR 1.6. She is on Zosyn. She is on CLD  She was seen by oncology yesterday and cleared her for procedures (ie: cholecystostomy tube); however, patient is still refusing this and would like to talk to family. She states "I'll have an answer in a day or two."  Review of Systems:  Constitutional: denies fever, chills  HEENT: denies cough or congestion  Respiratory: denies any shortness of breath  Cardiovascular: denies chest pain; + palpitations  Gastrointestinal: + abdominal pain (unchanged), denied N/V Genitourinary: denies burning with urination or urinary frequency Musculoskeletal: denies pain, decreased motor or sensation  Vital signs in last 24 hours: [min-max] current  Temp:  [97.9 F (36.6 C)-98 F (36.7 C)] 98 F (36.7 C) (02/27 0300) Pulse Rate:  [72-152] 120 (02/27 0630) Resp:  [10-25] 19 (02/27 0630) BP: (98-131)/(66-120) 111/85 (02/27 0630) SpO2:  [95 %-100 %] 97 % (02/27 0630)     Height: '4\' 11"'$  (149.9 cm) Weight: 54.5 kg BMI (Calculated): 24.25   Intake/Output last 2 shifts:  02/26 0701 - 02/27 0700 In: 1919.9 [P.O.:560; I.V.:1163.1; IV Piggyback:196.8] Out: 350 [Urine:350]   Physical Exam:  Constitutional: alert, cooperative and no distress  HENT: normocephalic without obvious abnormality  Eyes: PERRL, EOM's  grossly intact and symmetric  Respiratory: breathing non-labored at rest  Cardiovascular: tachycardic; irregularly irregular Gastrointestinal: Soft, abdomen remains tender but much improved compared to yesterday, right still slightly worse than left, non-distended.  Musculoskeletal: no edema or wounds, motor and sensation grossly intact, NT    Labs:     Latest Ref Rng & Units 10/05/2022    5:16 AM 10/04/2022    4:49 AM 10/03/2022    8:44 AM  CBC  WBC 4.0 - 10.5 K/uL 5.4  9.1  10.0   Hemoglobin 12.0 - 15.0 g/dL 10.0  10.2  10.7   Hematocrit 36.0 - 46.0 % 30.6  30.2  32.2   Platelets 150 - 400 K/uL 197  175  151       Latest Ref Rng & Units 10/05/2022    5:16 AM 10/04/2022    4:49 AM 10/03/2022    8:44 AM  CMP  Glucose 70 - 99 mg/dL 90  97  90   BUN 8 - 23 mg/dL '17  12  9   '$ Creatinine 0.44 - 1.00 mg/dL 1.00  0.89  0.84   Sodium 135 - 145 mmol/L 131  135  137   Potassium 3.5 - 5.1 mmol/L 3.8  3.8  3.5   Chloride 98 - 111 mmol/L 105  108  109   CO2 22 - 32 mmol/L '19  19  20   '$ Calcium 8.9 - 10.3 mg/dL 7.5  7.3  7.4   Total Protein 6.5 - 8.1 g/dL 5.7  5.6  6.0   Total Bilirubin 0.3 - 1.2 mg/dL 1.5  1.5  2.0   Alkaline  Phos 38 - 126 U/L 48  39  40   AST 15 - 41 U/L 37  27  30   ALT 0 - 44 U/L '14  10  13      '$ Imaging studies: No new pertinent imaging studies   Assessment/Plan: (ICD-10's: K81.0) 72 y.o. female with acute cholecystitis in setting of cirrhosis Ardine Eng B) and metastatic bladder cancer   - We again discussed role for cholecystostomy tube placement in this setting to treat cholecystitis. She understands but would like to talk to family about this. States "she will have an answer in a day or two." Fortunately, this morning her examination is improved, and she is without leukocytosis nor fever, but she did go into atrial fibrillation with RVR last night. We will will continue with Abx management alone for now. She does not want cholecystectomy and she is certainly a  sub-optimal candidate given her comorbidities as mentioned. She is Ardine Eng B (30% peri-operative mortality).    - Would not advance past CLD right now - If she is agreeable with IR Cholecystostomy tube would need to be NPO  - Continue IV Abx (Zosyn) - Monitor abdominal examination - Pain control prn; antiemetics prn  - Mobilize as feasible - Appreciate oncology input  - Further management per primary service; we will follow    All of the above findings and recommendations were discussed with the patient, and the medical team, and all of patient's questions were answered to her expressed satisfaction.  -- Edison Simon, PA-C [facility] 10/05/2022, 7:22 AM M-F: 7am - 4pm

## 2022-10-05 NOTE — Consult Note (Incomplete)
CARDIOLOGY CONSULT NOTE               Patient ID: Kristina Ellis MRN: [id number] DOB/AGE: [birth year] 72 y.o.  Admit date: 10/02/2022 Referring Physician *** Primary Physician *** Primary Cardiologist *** Reason for Consultation ***  HPI: ***  Review of systems complete and found to be negative unless listed above     Past Medical History:  Diagnosis Date   Aortic atherosclerosis ([place])    Arthritis    Atrial fibrillation ([place])    a.) s/p TEE with cardioversion on 02/04/2020. b.) on daily apixaban.   Bladder mass    a.) CT 04/08/2021 --> 3.2 cm intraluminal bladder mass.   Carotid stenosis, bilateral    a.) Doppler 07/04/2020 --> mild; 1-49% stenosis BILATERALLY.   Cholelithiasis    a.) CT 11/06/2020 --> largest measured 4.5 cm   Chronic anticoagulation    a.) Apixaban   Common biliary duct calculus    a.) CT 04/08/2021 --> CBD dilated at 8 mm; 72m calculus within distal CBD.   Coronary artery disease involving native coronary artery of native heart without angina pectoris [id number]  Diastolic dysfunction    a.) TTE 07/04/2020 --> LVEF 60-65%; G1DD.   Hepatic cirrhosis (HCC)    Hepatic steatosis    Hepatitis 1970   History of 2019 novel coronavirus disease (COVID-19) 12/20/2019   History of marijuana use    Hypertension    Hypertensive retinopathy    IDA (iron deficiency anemia)    Mitral stenosis    a.) TEE 04/10/2020 --> mild. b.) TTE 07/04/2020 --> EF 60-65%; mild (mean gradient 5 mmHg).   Moderate pulmonary arterial systolic hypertension ([place] 12/22/2019   Nephrolithiasis    [place]STEMI (non-ST elevated [place] infarction) [place] 07/26/2016   Open-angle glaucoma 09/11/2010   PAH (pulmonary artery hypertension) ([place]    a.) TTE 12/21/2019 --> PASP 44 mmHg.   Uterine fibroid    a.) CT 04/08/2021 --> multiple with largest measuring 7 cm.   Valvular regurgitation    a.) TTE 09/01/2010 --> trivial to mild pan-valvular. b.) TTE 12/21/2019 --> mild MR and AR;  moderate TR. c.) TTE 07/04/2020 --> mild TR; trivial MR and PR.   Vestibular neuronitis     Past Surgical History:  Procedure Laterality Date   BREAST CYST ASPIRATION Left    COLONOSCOPY  2012   ERCP N/A 12/18/2021   Procedure: ENDOSCOPIC RETROGRADE CHOLANGIOPANCREATOGRAPHY (ERCP);  Surgeon: WLucilla Lame MD;  [facility];  Service: Endoscopy;  Laterality: N/A;   TEE WITH CARDIOVERSION N/A 02/04/2020   Procedure: TEE WITH CARDIOVERSION; Location: [facility]; Surgeon: CKandis Cocking MD   TRANSURETHRAL RESECTION OF BLADDER TUMOR N/A 05/15/2021   Procedure: TRANSURETHRAL RESECTION OF BLADDER TUMOR (TURBT);  Surgeon: SBilley Co MD;  Location: [facility] ORS;  Service: Urology;  Laterality: N/A;    Medications Prior to Admission  Medication Sig Dispense Refill Last Dose   amiodarone (PACERONE) 200 MG tablet Take by mouth.   10/02/2022   magnesium oxide (MAG-OX) 400 MG tablet Take 1 tablet by mouth 2 (two) times daily.   Past Month   metoprolol tartrate (LOPRESSOR) 100 MG tablet Take 1 tablet by mouth 2 (two) times daily.   10/02/2022   potassium chloride SA (KLOR-CON M) 20 MEQ tablet Take 1 tablet (20 mEq total) by mouth 2 (two) times daily. 90 tablet 1 [id number]  trolamine salicylate (ASPERCREME) 10 % cream Apply 1 application topically as needed for muscle pain.   Past Month  atorvastatin (LIPITOR) 40 MG tablet Take 40 mg by mouth at bedtime. (Patient not taking: Reported on 08/31/2022)   Not Taking   diltiazem (CARDIZEM CD) 120 MG 24 hr capsule Take 1 capsule (120 mg total) by mouth daily. (Patient not taking: Reported on 08/31/2022) 30 capsule 0 Not Taking   ELIQUIS 5 MG TABS tablet Take 1 tablet (5 mg total) by mouth 2 (two) times daily. 60 tablet 1    metoprolol succinate (TOPROL-XL) 25 MG 24 hr tablet Take 1 tablet (25 mg total) by mouth 2 (two) times daily. Take with or immediately following a meal. 60 tablet 0    Multiple Vitamin (MULTIVITAMIN WITH MINERALS) TABS tablet Take 1 tablet by  mouth daily. (Patient not taking: Reported on 08/31/2022) 90 tablet 0 Not Taking   oxybutynin (DITROPAN-XL) 10 MG 24 hr tablet Take 1 tablet (10 mg total) by mouth daily. (Patient not taking: Reported on 08/31/2022) 30 tablet 11 Not Taking   Social History   Socioeconomic History   Marital status: Single    Spouse name: Not on file   Number of children: 1   Years of education: Not on file   Highest education level: Not on file  Occupational History   Not on file  Tobacco Use   Smoking status: Former    Packs/day: 0.10    Years: 10.00    Total pack years: 1.00    Types: Cigarettes    Passive exposure: Past   Smokeless tobacco: Never   Tobacco comments:    occasional smoker  Vaping Use   Vaping Use: Never used  Substance and Sexual Activity   Alcohol use: Yes    Alcohol/week: 5.0 standard drinks of alcohol    Types: 5 Cans of beer per week    Comment: weekly   Drug use: Not Currently    Types: 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, 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   Sexual activity: Not on file  Other Topics Concern   Not on file  Social History Narrative   Lives with son   Social Determinants of Health   Financial Resource Strain: Low Risk  (06/18/2022)   Overall Financial Resource Strain (CARDIA)    Difficulty of Paying Living Expenses: Not very hard  Food Insecurity: No Food Insecurity (10/03/2022)   Hunger Vital Sign    Worried About Running Out of Food in the Last Year: Never true    Ran Out of Food in the Last Year: Never true  Transportation Needs: No Transportation Needs (10/03/2022)   PRAPARE - Hydrologist (Medical): No    Lack of Transportation (Non-Medical): No  Physical Activity: Inactive (06/18/2022)   Exercise Vital Sign    Days of Exercise per Week: 0 days    Minutes of Exercise per Session: 0 min  Stress: No Stress Concern Present (06/18/2022)   [place]    Feeling of Stress : Only a  little  Social Connections: Socially Isolated (06/18/2022)   Social Connection and Isolation Panel [NHANES]    Frequency of Communication with Friends and Family: More than three times a week    Frequency of Social Gatherings with Friends and Family: More than three times a week    Attends Religious Services: Never    Marine scientist or Organizations: No    Attends Archivist Meetings: Never    Marital Status: Never married  Intimate Partner Violence: Not At Risk (10/03/2022)  Humiliation, Afraid, Rape, and Kick questionnaire    Fear of Current or Ex-Partner: No    Emotionally Abused: No    Physically Abused: No    Sexually Abused: No    Family History  Problem Relation Age of Onset   Aneurysm Mother    Colon cancer Father    Lung cancer Brother    Breast cancer Neg Hx       Review of systems complete and found to be negative unless listed above      PHYSICAL EXAM  General: Well developed, well nourished, in no acute distress HEENT:  Normocephalic and atramatic Neck:  No JVD.  Lungs: Clear bilaterally to auscultation and percussion. Heart: HRRR . Normal S1 and S2 without gallops or murmurs.  Abdomen: Bowel sounds are positive, abdomen soft and non-tender  Msk:  Back normal, normal gait. Normal strength and tone for age. Extremities: No clubbing, cyanosis or edema.   Neuro: Alert and oriented X 3. Psych:  Good affect, responds appropriately  Labs:   Lab Results  Component Value Date   WBC 5.4 10/05/2022   HGB 10.0 (L) 10/05/2022   HCT 30.6 (L) 10/05/2022   MCV 106.3 (H) 10/05/2022   PLT 197 10/05/2022    Recent Labs  Lab 10/05/22 0516  NA 131*  K 3.8  CL 105  CO2 19*  BUN 17  CREATININE 1.00  CALCIUM 7.5*  PROT 5.7*  BILITOT 1.5*  ALKPHOS 48  ALT 14  AST 37  GLUCOSE 90   Lab Results  Component Value Date   TROPONINI 0.33 (HH) 07/27/2016    Lab Results  Component Value Date   CHOL 98 07/27/2016   Lab Results  Component  Value Date   HDL 42 07/27/2016   Lab Results  Component Value Date   LDLCALC 46 07/27/2016   Lab Results  Component Value Date   TRIG 50 07/27/2016   Lab Results  Component Value Date   CHOLHDL 2.3 07/27/2016   No results found for: "LDLDIRECT"    Radiology: ECHOCARDIOGRAM COMPLETE  Result Date: 10/04/2022    ECHOCARDIOGRAM REPORT   Patient Name:   ANALISE DOMIN Date of Exam: 10/04/2022 Medical Rec #:  [id number]        Height:       59.0 in Accession #:    [id number]       Weight:       120.1 lb Date of Birth:  02/16/1951         BSA:          1.485 m Patient Age:    8 years         BP:           131/120 mmHg Patient Gender: F                HR:           113 bpm. Exam Location:  [facility] Procedure: 2D Echo, Cardiac Doppler and Color Doppler Indications:     Bacteremia  History:         Patient has prior history of Echocardiogram examinations, most                  recent 08/10/2021. CAD and Previous Myocardial Infarction,                  Pulmonary HTN, Arrythmias:Atrial Fibrillation;                  Signs/Symptoms:Bacteremia  and Chest Pain. Bladder CA.  Sonographer:     Wenda Low Referring Phys:  [id number] Charlesetta Ivory GONFA Diagnosing Phys: Kathlyn Sacramento MD IMPRESSIONS  1. Left ventricular ejection fraction, by estimation, is 45 to 50%. The left ventricle has mildly decreased function. Left ventricular endocardial border not optimally defined to evaluate regional wall motion. There is moderate left ventricular hypertrophy. Left ventricular diastolic parameters are indeterminate.  2. Right ventricular systolic function is normal. The right ventricular size is normal. There is moderately elevated pulmonary artery systolic pressure.  3. Left atrial size was severely dilated.  4. Small vegetation on the mitral valve.  5. The mitral valve is abnormal. Moderate mitral valve regurgitation. No evidence of mitral stenosis. Severe mitral annular calcification.  6. Tricuspid valve regurgitation is moderate.   7. The aortic valve is normal in structure. Aortic valve regurgitation is trivial. Mild aortic valve stenosis. Aortic valve area, by VTI measures 1.43 cm. Aortic valve mean gradient measures 6.0 mmHg. Conclusion(s)/Recommendation(s): Findings concerning for mitral valve vegetation, would recommend a Transesophageal Echocardiogram for clarification. FINDINGS  Left Ventricle: Left ventricular ejection fraction, by estimation, is 45 to 50%. The left ventricle has mildly decreased function. Left ventricular endocardial border not optimally defined to evaluate regional wall motion. The left ventricular internal cavity size was normal in size. There is moderate left ventricular hypertrophy. Left ventricular diastolic parameters are indeterminate. Right Ventricle: The right ventricular size is normal. No increase in right ventricular wall thickness. Right ventricular systolic function is normal. There is moderately elevated pulmonary artery systolic pressure. The tricuspid regurgitant velocity is 3.16 m/s, and with an assumed right atrial pressure of 8 mmHg, the estimated right ventricular systolic pressure is [id number] mmHg. Left Atrium: Left atrial size was severely dilated. Right Atrium: Right atrial size was normal in size. Pericardium: There is no evidence of pericardial effusion. Mitral Valve: The mitral valve is abnormal. There is moderate thickening of the mitral valve leaflet(s). There is moderate calcification of the mitral valve leaflet(s). Severe mitral annular calcification. Moderate mitral valve regurgitation. No evidence  of mitral valve stenosis. MV peak gradient, 16.2 mmHg. The mean mitral valve gradient is 7.0 mmHg. Tricuspid Valve: The tricuspid valve is normal in structure. Tricuspid valve regurgitation is moderate . No evidence of tricuspid stenosis. Aortic Valve: The aortic valve is normal in structure. Aortic valve regurgitation is trivial. Mild aortic stenosis is present. Aortic valve mean gradient  measures 6.0 mmHg. Aortic valve peak gradient measures 12.8 mmHg. Aortic valve area, by VTI measures  1.43 cm. Pulmonic Valve: The pulmonic valve was normal in structure. Pulmonic valve regurgitation is trivial. No evidence of pulmonic stenosis. Aorta: The aortic root is normal in size and structure. Venous: The inferior vena cava was not well visualized. IAS/Shunts: No atrial level shunt detected by color flow Doppler.  LEFT VENTRICLE PLAX 2D LVIDd:         3.50 cm LVIDs:         2.70 cm LV PW:         1.20 cm LV IVS:        1.30 cm LVOT diam:     1.90 cm LV SV:         39 LV SV Index:   27 LVOT Area:     2.84 cm  RIGHT VENTRICLE RV Basal diam:  3.20 cm RV Mid diam:    2.40 cm RV S prime:     13.50 cm/s LEFT ATRIUM  Index        RIGHT ATRIUM           Index LA diam:        5.00 cm 3.37 cm/m   RA Area:     14.50 cm LA Vol (A2C):   69.7 ml 46.93 ml/m  RA Volume:   37.90 ml  25.52 ml/m LA Vol (A4C):   86.6 ml 58.31 ml/m LA Biplane Vol: 79.4 ml 53.46 ml/m  AORTIC VALVE                     PULMONIC VALVE AV Area (Vmax):    1.71 cm      PV Vmax:       1.01 m/s AV Area (Vmean):   1.53 cm      PV Peak grad:  4.1 mmHg AV Area (VTI):     1.43 cm AV Vmax:           179.00 cm/s AV Vmean:          112.000 cm/s AV VTI:            0.275 m AV Peak Grad:      12.8 mmHg AV Mean Grad:      6.0 mmHg LVOT Vmax:         108.00 cm/s LVOT Vmean:        60.300 cm/s LVOT VTI:          0.139 m LVOT/AV VTI ratio: 0.51  AORTA Ao Root diam: 3.20 cm Ao Asc diam:  2.70 cm MITRAL VALVE                TRICUSPID VALVE MV Area (PHT): 2.91 cm     TR Peak grad:   39.9 mmHg MV Area VTI:   1.68 cm     TR Vmax:        316.00 cm/s MV Peak grad:  16.2 mmHg MV Mean grad:  7.0 mmHg     SHUNTS MV Vmax:       2.01 m/s     Systemic VTI:  0.14 m MV Vmean:      116.0 cm/s   Systemic Diam: 1.90 cm MV Decel Time: 261 msec MV E velocity: 159.00 cm/s Kathlyn Sacramento MD Electronically signed by Kathlyn Sacramento MD Signature Date/Time:  10/04/2022/5:20:56 PM    Final    DG Chest Port 1 View  Result Date: 10/04/2022 CLINICAL DATA:  Shortness of breath. EXAM: PORTABLE CHEST 1 VIEW COMPARISON:  Chest radiograph and CT 10/02/2022 FINDINGS: The patient is rotated to the right. The cardiac silhouette is borderline enlarged. There is extensive mitral annular calcification. Lung volumes are low with mild interstitial densities in the lung bases. No overt pulmonary edema, lobar consolidation, sizeable pleural effusion, or pneumothorax is identified. Advanced bilateral glenohumeral arthropathy is again noted. IMPRESSION: Low lung volumes with mild bibasilar opacities which could reflect atelectasis or early infection. Electronically Signed   By: Logan Bores M.D.   On: 10/04/2022 15:15   MR ABDOMEN MRCP W WO CONTAST  Result Date: 10/03/2022 CLINICAL DATA:  72 year old female with suspected biliary obstruction. EXAM: MRI ABDOMEN WITHOUT AND WITH CONTRAST (INCLUDING MRCP) TECHNIQUE: Multiplanar multisequence MR imaging of the abdomen was performed both before and after the administration of intravenous contrast. Heavily T2-weighted images of the biliary and pancreatic ducts were obtained, and three-dimensional MRCP images were rendered by post processing. CONTRAST:  38m GADAVIST GADOBUTROL 1 MMOL/ML IV SOLN COMPARISON:  Abdominal MRI 04/11/2022. Right  upper quadrant abdominal ultrasound 10/03/2022. CT of the abdomen and pelvis 10/02/2022. FINDINGS: Lower chest: Small right pleural effusion lying dependently. Hepatobiliary: Diffuse but heterogeneous loss of signal intensity throughout the hepatic parenchyma on out of phase dual echo images, indicative of a background of heterogeneous hepatic steatosis. Liver has a shrunken appearance and very nodular contour, indicative of underlying cirrhosis. No definite suspicious cystic or solid hepatic lesions are confidently identified. No intra or extrahepatic biliary ductal dilatation noted on MRCP images. Common  bile duct measures 6 mm in the porta hepatis. No filling defect within the common bile duct to suggest choledocholithiasis. However, there is heterogeneous signal intensity within the lumen of the gallbladder, most notable for a large signal void which measures up to 4.5 x 3.0 cm, compatible with a large stone. Gallbladder wall is irregularly thickened and edematous measuring up to 1 cm, with surrounding pericholecystic fluid and inflammatory changes. Pancreas: No pancreatic mass. No pancreatic ductal dilatation. No pancreatic or peripancreatic fluid collections or inflammatory changes. Spleen:  Unremarkable. Adrenals/Urinary Tract: Bilateral kidneys and bilateral adrenal glands are normal in appearance. No hydroureteronephrosis in the visualized portions of the abdomen. Stomach/Bowel: Visualized portions are unremarkable. Vascular/Lymphatic: No aneurysm identified in the visualized abdominal vasculature. No lymphadenopathy noted in the abdomen. Other: Trace volume of ascites. Inflammatory changes centered around the gallbladder in the right upper quadrant of the abdomen. Incidental imaging of the pelvis demonstrates an enlarged uterus with multiple lesions, presumably fibroids. Musculoskeletal: No aggressive appearing osseous lesions are noted in the visualized portions of the skeleton. IMPRESSION: 1. Cholelithiasis with imaging findings highly concerning for acute cholecystitis. Surgical consultation is recommended. 2. No choledocholithiasis or findings of biliary tract obstruction. 3. Hepatic cirrhosis and heterogeneous hepatic steatosis. No aggressive appearing hepatic lesion noted at this time. 4. Trace volume of ascites. 5. Small right pleural effusion lying dependently. Electronically Signed   By: Vinnie Langton M.D.   On: 10/03/2022 05:55   MR 3D Recon At Scanner  Result Date: 10/03/2022 CLINICAL DATA:  72 year old female with suspected biliary obstruction. EXAM: MRI ABDOMEN WITHOUT AND WITH CONTRAST  (INCLUDING MRCP) TECHNIQUE: Multiplanar multisequence MR imaging of the abdomen was performed both before and after the administration of intravenous contrast. Heavily T2-weighted images of the biliary and pancreatic ducts were obtained, and three-dimensional MRCP images were rendered by post processing. CONTRAST:  49m GADAVIST GADOBUTROL 1 MMOL/ML IV SOLN COMPARISON:  Abdominal MRI 04/11/2022. Right upper quadrant abdominal ultrasound 10/03/2022. CT of the abdomen and pelvis 10/02/2022. FINDINGS: Lower chest: Small right pleural effusion lying dependently. Hepatobiliary: Diffuse but heterogeneous loss of signal intensity throughout the hepatic parenchyma on out of phase dual echo images, indicative of a background of heterogeneous hepatic steatosis. Liver has a shrunken appearance and very nodular contour, indicative of underlying cirrhosis. No definite suspicious cystic or solid hepatic lesions are confidently identified. No intra or extrahepatic biliary ductal dilatation noted on MRCP images. Common bile duct measures 6 mm in the porta hepatis. No filling defect within the common bile duct to suggest choledocholithiasis. However, there is heterogeneous signal intensity within the lumen of the gallbladder, most notable for a large signal void which measures up to 4.5 x 3.0 cm, compatible with a large stone. Gallbladder wall is irregularly thickened and edematous measuring up to 1 cm, with surrounding pericholecystic fluid and inflammatory changes. Pancreas: No pancreatic mass. No pancreatic ductal dilatation. No pancreatic or peripancreatic fluid collections or inflammatory changes. Spleen:  Unremarkable. Adrenals/Urinary Tract: Bilateral kidneys and bilateral adrenal glands are normal  in appearance. No hydroureteronephrosis in the visualized portions of the abdomen. Stomach/Bowel: Visualized portions are unremarkable. Vascular/Lymphatic: No aneurysm identified in the visualized abdominal vasculature. No  lymphadenopathy noted in the abdomen. Other: Trace volume of ascites. Inflammatory changes centered around the gallbladder in the right upper quadrant of the abdomen. Incidental imaging of the pelvis demonstrates an enlarged uterus with multiple lesions, presumably fibroids. Musculoskeletal: No aggressive appearing osseous lesions are noted in the visualized portions of the skeleton. IMPRESSION: 1. Cholelithiasis with imaging findings highly concerning for acute cholecystitis. Surgical consultation is recommended. 2. No choledocholithiasis or findings of biliary tract obstruction. 3. Hepatic cirrhosis and heterogeneous hepatic steatosis. No aggressive appearing hepatic lesion noted at this time. 4. Trace volume of ascites. 5. Small right pleural effusion lying dependently. Electronically Signed   By: Vinnie Langton M.D.   On: 10/03/2022 05:55   US ABDOMEN LIMITED RUQ (LIVER/GB)  Result Date: 10/03/2022 CLINICAL DATA:  Abdominal pain. EXAM: ULTRASOUND ABDOMEN LIMITED RIGHT UPPER QUADRANT COMPARISON:  April 11, 2022 FINDINGS: Gallbladder: Large echogenic gallstones are seen within the lumen of a distended gallbladder (the largest measures approximately 3.0 cm). This is seen on the prior study. Stable gallbladder wall thickening (5.4 mm) and pericholecystic fluid are also noted. The presence or absence of a sonographic Percell Miller sign was not provided by the sonographer. Common bile duct: Diameter: 10.1 mm (measured 9.2 mm on the prior study) Liver: A 1.0 cm x 1.1 cm x 1.0 cm hypoechoic area is seen within the left lobe of the liver. No flow is noted within this region on color Doppler evaluation. The liver parenchyma is nodular in contour and diffusely increased in echogenicity. Portal vein is patent on color Doppler imaging with normal direction of blood flow towards the liver. Other: There is a mild to moderate amount of perihepatic fluid. IMPRESSION: 1. Cholelithiasis and stable gallbladder wall thickening,  which may be secondary to ascites. Sequelae associated with acute cholecystitis cannot be excluded. 2. Hepatic steatosis and hepatic cirrhosis with additional findings that may represent a hepatic cyst within the left lobe. 3. Ascites. Electronically Signed   By: Virgina Norfolk M.D.   On: 10/03/2022 00:08   CT CHEST ABDOMEN PELVIS W CONTRAST  Result Date: 10/02/2022 CLINICAL DATA:  Sepsis, abdominal pain out of proportion, lactic acidosis. Bladder cancer. EXAM: CT CHEST, ABDOMEN, AND PELVIS WITH CONTRAST TECHNIQUE: Multidetector CT imaging of the chest, abdomen and pelvis was performed following the standard protocol during bolus administration of intravenous contrast. RADIATION DOSE REDUCTION: This exam was performed according to the departmental dose-optimization program which includes automated exposure control, adjustment of the mA and/or kV according to patient size and/or use of iterative reconstruction technique. CONTRAST:  183m OMNIPAQUE IOHEXOL 300 MG/ML  SOLN COMPARISON:  PET CT 08/26/2022, CT abdomen pelvis 04/11/2022 FINDINGS: CT CHEST FINDINGS Cardiovascular: Mild coronary artery calcification. Extensive calcification of the mitral valve annulus. Global cardiac size within normal limits. No pericardial effusion. Central pulmonary arteries are enlarged in keeping with changes of pulmonary arterial hypertension. Moderate atherosclerotic calcification within the thoracic aorta. No aortic aneurysm. Mediastinum/Nodes: Pathologic right paratracheal lymph node demonstrating hypermetabolism on prior PET CT examination is stable measuring 15 mm in short axis diameter at axial image # 17/2. No new pathologic thoracic adenopathy. Esophagus unremarkable. Visualized thyroid unremarkable. Lungs/Pleura: Interval development of small right pleural effusion. Interval development of trace interstitial pulmonary edema, possibly cardiogenic in nature. No pneumothorax. No central obstructing lesion. Musculoskeletal:  Periosteal reaction involving metastatic lesion involving the right fifth rib  anteriorly. Stable subacute fractures of the superior endplate of [id number] anteriorly and the medial right eleventh and twelfth ribs proximal to the costotransverse junction demonstrating callus is again noted. No acute bone abnormality. No focal lytic lesion identified. CT ABDOMEN PELVIS FINDINGS Hepatobiliary: Cholelithiasis again noted. The gallbladder is distended and a gallstone is seen impacted within the gallbladder neck, similar to prior examination. There has, however, developed increasing pericholecystic infiltration which may relate to inflammatory changes the gallbladder, as can be seen with calculus cholecystitis, or progressive ascites. Cirrhosis. No enhancing intrahepatic mass. No intra or extrahepatic biliary ductal dilation. Pancreas: Unremarkable Spleen: Unremarkable Adrenals/Urinary Tract: The adrenal glands are unremarkable. The kidneys are normal in size and position. Mild left hydronephrosis and hydroureter to the level of the left ureterovesicular junction is stable since prior PET CT examination. No hydronephrosis on the right. No intrarenal or ureteral calculi. No enhancing intrarenal masses. The bladder is unremarkable. Stomach/Bowel: There is subtle asymmetric bowel wall thickening involving the hepatic flexure of the colon adjacent to the gallbladder suggesting an adjacent inflammatory process. The stomach, small bowel, and large bowel are otherwise unremarkable. Appendix normal. Mild ascites is present, new since prior PET CT examination. No free intraperitoneal gas. Vascular/Lymphatic: Extensive aortoiliac atherosclerotic calcification. No aortic aneurysm. Stable left periaortic adenopathy since prior PET CT examination where this demonstrate no significant metabolic activity and likely represents the residua of treated disease. No new pathologic adenopathy within the abdomen and pelvis. Reproductive: Multiple  hypoenhancing masses are again seen within the uterus most in keeping with multiple uterine fibroids. The pelvic organs are otherwise unremarkable. Other: Increasing diffuse subcutaneous body wall edema in keeping with progressive anasarca. No abdominal wall hernia. Musculoskeletal: Degenerative changes are seen within the lumbar spine. No acute bone abnormality. No lytic bone lesion. IMPRESSION: 1. Interval development of small right pleural effusion, trace interstitial pulmonary edema, mild ascites, and progressive diffuse subcutaneous body wall edema in keeping with progressive anasarca and/or cardiogenic failure. 2. Cholelithiasis with gallstone impacted within the gallbladder neck. Interval development of pericholecystic infiltration which may relate to inflammatory changes the gallbladder, as can be seen with calculus cholecystitis, or progressive ascites. Subtle inflammatory change involving the adjacent hepatic flexure of the colon, however, suggests acute cholecystitis. Correlation with liver enzymes and possible right upper quadrant sonography may be helpful for further evaluation. 3. Cirrhosis. 4. Stable mild left hydronephrosis and hydroureter to the level of the left ureterovesicular junction, possibly related to the patient's known underlying bladder malignancy. No mass lesion identified, however. 5. Stable subacute fractures of the superior endplate of 624THL anteriorly and the medial right eleventh and twelfth ribs proximal to the costotransverse junction. No acute bone abnormality identified. 6. Mild coronary artery calcification. 7.  Aortic Atherosclerosis (ICD10-I70.0). Electronically Signed   By: Fidela Salisbury M.D.   On: 10/02/2022 21:53   DG Chest Port 1 View  Result Date: 10/02/2022 CLINICAL DATA:  Sepsis EXAM: PORTABLE CHEST 1 VIEW COMPARISON:  04/11/2022 FINDINGS: Lungs are well expanded, symmetric, and clear. No pneumothorax or pleural effusion. Cardiac size within normal limits. Extensive  mitral valve annular calcifications again noted. Pulmonary vascularity is normal. Osseous structures are age-appropriate. Advanced degenerative changes noted within the shoulders bilaterally. No acute bone abnormality. IMPRESSION: 1. No active disease. Electronically Signed   By: Fidela Salisbury M.D.   On: 10/02/2022 19:38    EKG: ***  ASSESSMENT AND PLAN:  ***  Signed: Yolonda Kida MD, PHD, [facility] 10/05/2022, 8:35 AM

## 2022-10-05 NOTE — Consult Note (Addendum)
ANTICOAGULATION CONSULT NOTE   Pharmacy Consult for Heparin Indication: atrial fibrillation  Allergies  Allergen Reactions   Atenolol Other (See Comments)    bradycardia bradycardia     Patient Measurements: Height: '4\' 11"'$  (149.9 cm) Weight: 54.5 kg (120 lb 2.4 oz) IBW/kg (Calculated) : 43.2 Heparin Dosing Weight: 54.5 kg  Vital Signs: Temp: 97.9 F (36.6 C) (02/27 0830) Temp Source: Oral (02/27 0830) BP: 137/97 (02/27 0830) Pulse Rate: 115 (02/27 0914)  Labs: Recent Labs     0000 10/02/22 1832 10/02/22 1833 10/02/22 1834 10/02/22 2058 10/03/22 0353 10/03/22 0844 10/04/22 0449 10/05/22 0516  HGB  --   --   --    < >  --   --  10.7* 10.2* 10.0*  HCT  --   --   --    < >  --   --  32.2* 30.2* 30.6*  PLT  --   --   --    < >  --   --  151 175 197  APTT  --  35  --   --   --   --   --   --   --   LABPROT  --  20.1*  --   --   --  20.2*  --  19.2* 18.5*  INR  --  1.7*  --   --   --  1.7*  --  1.6* 1.6*  CREATININE   < >  --  0.82  --   --   --  0.84 0.89 1.00  TROPONINIHS  --   --  17  --  25*  --   --   --   --    < > = values in this interval not displayed.    Estimated Creatinine Clearance: 38.9 mL/min (by C-G formula based on SCr of 1 mg/dL).   Medical History: Past Medical History:  Diagnosis Date   Aortic atherosclerosis ([place])    Arthritis    Atrial fibrillation ([place])    a.) s/p TEE with cardioversion on 02/04/2020. b.) on daily apixaban.   Bladder mass    a.) CT 04/08/2021 --> 3.2 cm intraluminal bladder mass.   Carotid stenosis, bilateral    a.) Doppler 07/04/2020 --> mild; 1-49% stenosis BILATERALLY.   Cholelithiasis    a.) CT 11/06/2020 --> largest measured 4.5 cm   Chronic anticoagulation    a.) Apixaban   Common biliary duct calculus    a.) CT 04/08/2021 --> CBD dilated at 8 mm; 71m calculus within distal CBD.   Coronary artery disease involving native coronary artery of native heart without angina pectoris [id number]  Diastolic dysfunction     a.) TTE 07/04/2020 --> LVEF 60-65%; G1DD.   Hepatic cirrhosis (HCC)    Hepatic steatosis    Hepatitis 1970   History of 2019 novel coronavirus disease (COVID-19) 12/20/2019   History of marijuana use    Hypertension    Hypertensive retinopathy    IDA (iron deficiency anemia)    Mitral stenosis    a.) TEE 04/10/2020 --> mild. b.) TTE 07/04/2020 --> EF 60-65%; mild (mean gradient 5 mmHg).   Moderate pulmonary arterial systolic hypertension ([facility] 12/22/2019   Nephrolithiasis    NSTEMI (non-ST elevated myocardial infarction) ([facility] 07/26/2016   Open-angle glaucoma 09/11/2010   PAH (pulmonary artery hypertension) ([place]    a.) TTE 12/21/2019 --> PASP 44 mmHg.   Uterine fibroid    a.) CT 04/08/2021 --> multiple with largest measuring 7 cm.  Valvular regurgitation    a.) TTE 09/01/2010 --> trivial to mild pan-valvular. b.) TTE 12/21/2019 --> mild MR and AR; moderate TR. c.) TTE 07/04/2020 --> mild TR; trivial MR and PR.   Vestibular neuronitis     Medications:  Medications Prior to Admission  Medication Sig Dispense Refill Last Dose   amiodarone (PACERONE) 200 MG tablet Take by mouth.   10/02/2022   magnesium oxide (MAG-OX) 400 MG tablet Take 1 tablet by mouth 2 (two) times daily.   Past Month   metoprolol tartrate (LOPRESSOR) 100 MG tablet Take 1 tablet by mouth 2 (two) times daily.   10/02/2022   potassium chloride SA (KLOR-CON M) 20 MEQ tablet Take 1 tablet (20 mEq total) by mouth 2 (two) times daily. 90 tablet 1 [id number]   trolamine salicylate (ASPERCREME) 10 % cream Apply 1 application topically as needed for muscle pain.   Past Month   atorvastatin (LIPITOR) 40 MG tablet Take 40 mg by mouth at bedtime. (Patient not taking: Reported on 08/31/2022)   Not Taking   diltiazem (CARDIZEM CD) 120 MG 24 hr capsule Take 1 capsule (120 mg total) by mouth daily. (Patient not taking: Reported on 08/31/2022) 30 capsule 0 Not Taking   ELIQUIS 5 MG TABS tablet Take 1 tablet (5 mg total) by mouth 2  (two) times daily. 60 tablet 1    metoprolol succinate (TOPROL-XL) 25 MG 24 hr tablet Take 1 tablet (25 mg total) by mouth 2 (two) times daily. Take with or immediately following a meal. 60 tablet 0    Multiple Vitamin (MULTIVITAMIN WITH MINERALS) TABS tablet Take 1 tablet by mouth daily. (Patient not taking: Reported on 08/31/2022) 90 tablet 0 Not Taking   oxybutynin (DITROPAN-XL) 10 MG 24 hr tablet Take 1 tablet (10 mg total) by mouth daily. (Patient not taking: Reported on 08/31/2022) 30 tablet 11 Not Taking   Scheduled:   metoprolol tartrate  25 mg Oral Q6H   midodrine  5 mg Oral TID WC   Infusions:   amiodarone 30 mg/hr (10/05/22 0509)   piperacillin-tazobactam (ZOSYN)  IV 12.5 mL/hr at 10/05/22 0509   PRN: morphine injection, ondansetron **OR** ondansetron (ZOFRAN) IV Anti-infectives (From admission, onward)    Start     Dose/Rate Route Frequency Ordered Stop   10/03/22 0900  cefTRIAXone (ROCEPHIN) 1 g in sodium chloride 0.9 % 100 mL IVPB  Status:  Discontinued        1 g 200 mL/hr over 30 Minutes Intravenous Every 24 hours 10/03/22 0813 10/03/22 0814   10/03/22 0900  metroNIDAZOLE (FLAGYL) IVPB 500 mg  Status:  Discontinued        500 mg 100 mL/hr over 60 Minutes Intravenous Every 12 hours 10/03/22 0813 10/03/22 0814   10/03/22 0600  piperacillin-tazobactam (ZOSYN) IVPB 3.375 g        3.375 g 12.5 mL/hr over 240 Minutes Intravenous Every 8 hours 10/02/22 2310     10/02/22 1915  vancomycin (VANCOCIN) IVPB 1000 mg/200 mL premix        1,000 mg 200 mL/hr over 60 Minutes Intravenous  Once 10/02/22 1911 10/02/22 2154   10/02/22 1900  ceFEPIme (MAXIPIME) 2 g in sodium chloride 0.9 % 100 mL IVPB        2 g 200 mL/hr over 30 Minutes Intravenous  Once 10/02/22 1857 10/02/22 1941   10/02/22 1900  metroNIDAZOLE (FLAGYL) IVPB 500 mg        500 mg 100 mL/hr over 60 Minutes Intravenous  Once 10/02/22  1857 10/02/22 2046       Assessment: 72 year old F with PMH of A-fib on Eliquis,  cholelithiasis, bladder cancer, HTN, CAD and hepatitis presenting with RUQ pain, nausea and vomiting. Possibility of perc chole drain placement. Last dose apixaban 2/26 PM. Pharmacy will start heparin while we are holding apixaban. CHADSVASc 4. Will need to use aPTT for monitoring. Will not order baseline HL.   Goal of Therapy:  aPTT 66-102 seconds Monitor platelets by anticoagulation protocol: Yes   Plan:  Start heparin infusion at 750 units/hr Check aPTT level in 8 hours and daily while on heparin CBC and heparin level with AM labs. Once heparin level and aPTT correlate, switch to heparin monitoring.   Oswald Hillock, PharmD, BCPS 10/05/2022,10:08 AM

## 2022-10-05 NOTE — Consult Note (Signed)
ANTICOAGULATION CONSULT NOTE   Pharmacy Consult for Heparin Indication: atrial fibrillation  Allergies  Allergen Reactions   Atenolol Other (See Comments)    bradycardia bradycardia     Patient Measurements: Height: '4\' 11"'$  (149.9 cm) Weight: 54.5 kg (120 lb 2.4 oz) IBW/kg (Calculated) : 43.2 Heparin Dosing Weight: 54.5 kg  Vital Signs: Temp: 98 F (36.7 C) (02/27 1901) Temp Source: Oral (02/27 1901) BP: 123/95 (02/27 1930) Pulse Rate: 74 (02/27 2030)  Labs: Recent Labs    10/03/22 0353 10/03/22 0844 10/03/22 0844 10/04/22 0449 10/05/22 0516 10/05/22 1047 10/05/22 1947  HGB  --  10.7*   < > 10.2* 10.0*  --   --   HCT  --  32.2*  --  30.2* 30.6*  --   --   PLT  --  151  --  175 197  --   --   APTT  --   --   --   --   --  40* 109*  LABPROT 20.2*  --   --  19.2* 18.5*  --   --   INR 1.7*  --   --  1.6* 1.6*  --   --   CREATININE  --  0.84  --  0.89 1.00  --   --    < > = values in this interval not displayed.     Estimated Creatinine Clearance: 38.9 mL/min (by C-G formula based on SCr of 1 mg/dL).   Medical History: Past Medical History:  Diagnosis Date   Aortic atherosclerosis ([place])    Arthritis    Atrial fibrillation ([place])    a.) s/p TEE with cardioversion on 02/04/2020. b.) on daily apixaban.   Bladder mass    a.) CT 04/08/2021 --> 3.2 cm intraluminal bladder mass.   Carotid stenosis, bilateral    a.) Doppler 07/04/2020 --> mild; 1-49% stenosis BILATERALLY.   Cholelithiasis    a.) CT 11/06/2020 --> largest measured 4.5 cm   Chronic anticoagulation    a.) Apixaban   Common biliary duct calculus    a.) CT 04/08/2021 --> CBD dilated at 8 mm; 60m calculus within distal CBD.   Coronary artery disease involving native coronary artery of native heart without angina pectoris [id number]  Diastolic dysfunction    a.) TTE 07/04/2020 --> LVEF 60-65%; G1DD.   Hepatic cirrhosis (HCC)    Hepatic steatosis    Hepatitis 1970   History of 2019 novel coronavirus  disease (COVID-19) 12/20/2019   History of marijuana use    Hypertension    Hypertensive retinopathy    IDA (iron deficiency anemia)    Mitral stenosis    a.) TEE 04/10/2020 --> mild. b.) TTE 07/04/2020 --> EF 60-65%; mild (mean gradient 5 mmHg).   Moderate pulmonary arterial systolic hypertension ([facility] 12/22/2019   Nephrolithiasis    NSTEMI (non-ST elevated myocardial infarction) ([facility] 07/26/2016   Open-angle glaucoma 09/11/2010   PAH (pulmonary artery hypertension) ([place]    a.) TTE 12/21/2019 --> PASP 44 mmHg.   Uterine fibroid    a.) CT 04/08/2021 --> multiple with largest measuring 7 cm.   Valvular regurgitation    a.) TTE 09/01/2010 --> trivial to mild pan-valvular. b.) TTE 12/21/2019 --> mild MR and AR; moderate TR. c.) TTE 07/04/2020 --> mild TR; trivial MR and PR.   Vestibular neuronitis     Medications:  PTA: Eliquis '5mg'$  BID (last dose 2/26 PM)  Inpatient; Heparin infusion (2/27 >>) Allergies: No AC/APT related allergies   Assessment: 72year old F  with PMH of A-fib on Eliquis, cholelithiasis, bladder cancer, HTN, CAD and hepatitis presenting with RUQ pain, nausea and vomiting. Possibility of perc chole drain placement. Last dose apixaban 2/26 PM. Pharmacy will start heparin while we are holding apixaban. CHADSVASc 4. Will need to use aPTT for monitoring. Will not order baseline HL.   Date Time aPTT/HL Rate/Comment 2/27 1947 109 / ---- SUPRAtherapeutic / 750 > 700 u/hr  Goal of Therapy:  Heparin level 0.3-0.7 units/ml aPTT 66-102 seconds Monitor platelets by anticoagulation protocol: Yes   Plan:  Decrease heparin infusion to 700 units/hr Check aPTT/Anti-Xa level in 8 hours and daily once consecutively therapeutic.  Titrate by aPTT's until lab correlation is noted, then titrate by anti-xa alone. Continue to monitor H&H and platelets daily while on heparin gtt.  Darrick Penna, PharmD, BCPS 10/05/2022,9:37 PM

## 2022-10-05 NOTE — Progress Notes (Signed)
PROGRESS NOTE  Kristina Ellis [id number] DOB: February 14, 1951   PCP: [facility] [place]  Patient is from: Home  DOA: 10/02/2022 LOS: 3  Chief complaints Chief Complaint  Patient presents with   Abdominal Pain    X2 days      Brief Narrative / Interim history: 72 year old F with PMH of A-fib on Eliquis, cholelithiasis, bladder cancer, HTN, CAD and hepatitis presenting with RUQ pain, nausea and vomiting.  Workup including CT abdomen and pelvis and MRCP concerning for acute calculus cholecystitis, hepatic cirrhosis and heterogeneous hepatic steatosis.  General surgery consulted and recommended percutaneous drain but IR feels patient is a poor candidate for percutaneous cholecystostomy due to cirrhosis and ascites with increased risk of bleeding and bacterial peritonitis, and recommended conservative management.  Patient is not interested in surgical intervention either.  Patient is on IV Zosyn.  General surgery following.  Blood culture with Streptococcus pneumonia.  Echocardiogram concerning for mitral valve vegetation.  ID consulted and following.  Patient is also in A-fib with RVR.  Started on amiodarone drip.  Cardiology consulted.  Subjective: Seen and examined earlier this morning.  Remains in A-fib with RVR with heart rate into 120s.  She was started on amiodarone yesterday.  Continues to endorse abdominal pain but denies nausea or vomiting.  Denies chest pain or shortness of breath.  Objective: Vitals:   10/05/22 0700 10/05/22 0830 10/05/22 0914 10/05/22 1200  BP: (!) 113/95 (!) 137/97  114/89  Pulse: (!) 120 (!) 120 (!) 115 (!) 118  Resp: '13 20  17  '$ Temp:  97.9 F (36.6 C)    TempSrc:  Oral    SpO2: 100% 100%  99%  Weight:      Height:        Examination:  GENERAL: Appears frail.  No apparent distress. HEENT: MMM.  Vision and hearing grossly intact.  NECK: Supple.  No apparent JVD.  RESP:  No IWOB.  Fair aeration bilaterally. CVS: IR IR.  Heart  sounds normal.  ABD/GI/GU: BS+. Abd firm.  Very tender.  Seems to have rebound tenderness.  She is also guarding. MSK/EXT:   No apparent deformity. Moves extremities. No edema.  SKIN: no apparent skin lesion or wound NEURO: Awake and alert. Oriented x 4..  No apparent focal neuro deficit. PSYCH: Calm. Normal affect.   Procedures:  None  Microbiology summarized: 2/24-blood culture with pansensitive Streptococcus pneumonia 2/26-repeat blood cultures NGTD  Assessment and plan: Principal Problem:   Cholecystitis Active Problems:   Atrial fibrillation with rapid ventricular response (HCC)   Bladder cancer (HCC)   Elevated troponin level not due myocardial infarction   Coronary artery disease involving native coronary artery of native heart without angina pectoris   Severe sepsis ([place])   Bacteremia due to Streptococcus   Goals of care, counseling/discussion   DNR (do not resuscitate)   Heart failure with mildly reduced ejection fraction ([place])   Mitral valve vegetation  Severe sepsis/Streptococcus pneumonia with concern for mitral valve vegetation: POA.  Patient had fever, tachycardia, tachypnea and lactic acidosis on presentation.  Initial blood culture with pansensitive Streptococcus pneumoniae in 1 out of 4 bottles.  Repeat blood cultures NGTD.  TTE concerning for mitral valve vegetation. -Continue IV Zosyn -ID following.  Acute calculus cholecystitis: Imaging including CT and MRI concerning for acute calculus cholecystitis.  -General surgery following and ordered IR PERC chole -IR-poor candidate for perc chole due to cirrhosis and ascites with increased risk of bleeding and bacterial peritonitis -IR recommends  conservative management.  Patient is not interested in any surgical intervention either. -Continue IV Zosyn -Advance diet to full liquid  Liver cirrhosis: Imaging concerning for hepatic cirrhosis and heterogeneous hepatic steatosis.  Appears compensated.  Paroxysmal  atrial fibrillation with RVR:   Seems to be on Lopressor 100 mg twice daily and amiodarone at home which were held while NPO. -Cardiology consulted. -On IV amiodarone, p.o. 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  CXR without pulmonary edema.  On room air.  No respiratory distress.  BNP slightly elevated.  Not on diuretics at home. -Hold off diuretics -Monitor fluid status  History of CAD: Stable. -Continue home meds   Hypokalemia/hypomagnesemia -Monitor replenish as appropriate  Lactic acidosis: Likely due to severe sepsis and liver cirrhosis.  Resolved.   History of bladder cancer: Seen by oncology. -Outpatient follow-up with oncology  Goal of care counseling: Change CODE STATUS to DNR/DNI. -See IPAL note   Body mass index is 24.27 kg/m.          DVT prophylaxis:  SCDs Start: 10/02/22 2313  Code Status: Full code Family Communication: Updated patient's sister, [name] over the phone on 2/26.  None at bedside today.. Level of care: Progressive Status is: Inpatient Remains inpatient appropriate because: Severe sepsis, Streptococcus bacteremia, mitral valve endocarditis, and calculus cholecystitis   Final disposition: TBD Consultants:  General surgery IR Infectious disease Cardiology Palliative medicine  55 minutes with more than 50% spent in reviewing records, counseling patient/family and coordinating care.   Sch Meds:  Scheduled Meds:  diltiazem  30 mg Oral Q6H   metoprolol tartrate  25 mg Oral Q6H   midodrine  5 mg Oral TID WC   Continuous Infusions:  amiodarone 30 mg/hr (10/05/22 0509)   heparin 750 Units/hr (10/05/22 1106)   piperacillin-tazobactam (ZOSYN)  IV 12.5 mL/hr at 10/05/22 0509   PRN Meds:.morphine injection, ondansetron **OR** ondansetron (ZOFRAN)  IV  Antimicrobials: Anti-infectives (From admission, onward)    Start     Dose/Rate Route Frequency Ordered Stop   10/03/22 0900  cefTRIAXone (ROCEPHIN) 1 g in sodium chloride 0.9 % 100 mL IVPB  Status:  Discontinued        1 g 200 mL/hr over 30 Minutes Intravenous Every 24 hours 10/03/22 0813 10/03/22 0814   10/03/22 0900  metroNIDAZOLE (FLAGYL) IVPB 500 mg  Status:  Discontinued        500 mg 100 mL/hr over 60 Minutes Intravenous Every 12 hours 10/03/22 0813 10/03/22 0814   10/03/22 0600  piperacillin-tazobactam (ZOSYN) IVPB 3.375 g        3.375 g 12.5 mL/hr over 240 Minutes Intravenous Every 8 hours 10/02/22 2310     10/02/22 1915  vancomycin (VANCOCIN) IVPB 1000 mg/200 mL premix        1,000 mg 200 mL/hr over 60 Minutes Intravenous  Once 10/02/22 1911 10/02/22 2154   10/02/22 1900  ceFEPIme (MAXIPIME) 2 g in sodium chloride 0.9 % 100 mL IVPB        2 g 200 mL/hr over 30 Minutes Intravenous  Once 10/02/22 1857 10/02/22 1941   10/02/22 1900  metroNIDAZOLE (FLAGYL) IVPB 500 mg        500 mg 100 mL/hr over 60 Minutes Intravenous  Once 10/02/22 1857 10/02/22 2046        I have personally reviewed the following labs and  images: CBC: Recent Labs  Lab 10/02/22 1834 10/03/22 0844 10/04/22 0449 10/05/22 0516  WBC 5.6 10.0 9.1 5.4  NEUTROABS  --  9.0* 8.3*  --   HGB 11.4* 10.7* 10.2* 10.0*  HCT 35.4* 32.2* 30.2* 30.6*  MCV 108.3* 104.5* 104.9* 106.3*  PLT 241 151 175 197   BMP &GFR Recent Labs  Lab 10/02/22 1833 10/03/22 0844 10/04/22 0449 10/05/22 0516  NA 136 137 135 131*  K 3.3* 3.5 3.8 3.8  CL 105 109 108 105  CO2 17* 20* 19* 19*  GLUCOSE 112* 90 97 90  BUN '8 9 12 17  '$ CREATININE 0.82 0.84 0.89 1.00  CALCIUM 7.9* 7.4* 7.3* 7.5*  MG 1.6* 1.2* 2.2 2.0  PHOS  --  4.3 3.5 2.7   Estimated Creatinine Clearance: 38.9 mL/min (by C-G formula based on SCr of 1 mg/dL). Liver & Pancreas: Recent Labs  Lab 10/02/22 1833 10/03/22 0844 10/04/22 0449 10/05/22 0516   AST 40 30 27 37  ALT '16 13 10 14  '$ ALKPHOS 64 40 39 48  BILITOT 1.2 2.0* 1.5* 1.5*  PROT 7.4 6.0* 5.6* 5.7*  ALBUMIN 2.3* 1.9* 1.6* 1.7*   Recent Labs  Lab 10/02/22 1833 10/03/22 0844  LIPASE 55* 44   No results for input(s): "AMMONIA" in the last 168 hours. Diabetic: No results for input(s): "HGBA1C" in the last 72 hours. No results for input(s): "GLUCAP" in the last 168 hours. Cardiac Enzymes: No results for input(s): "CKTOTAL", "CKMB", "CKMBINDEX", "TROPONINI" in the last 168 hours. No results for input(s): "PROBNP" in the last 8760 hours. Coagulation Profile: Recent Labs  Lab 10/02/22 1832 10/03/22 0353 10/04/22 0449 10/05/22 0516  INR 1.7* 1.7* 1.6* 1.6*   Thyroid Function Tests: Recent Labs    10/02/22 1833  TSH 8.816*  FREET4 1.53*   Lipid Profile: No results for input(s): "CHOL", "HDL", "LDLCALC", "TRIG", "CHOLHDL", "LDLDIRECT" in the last 72 hours. Anemia Panel: No results for input(s): "VITAMINB12", "FOLATE", "FERRITIN", "TIBC", "IRON", "RETICCTPCT" in the last 72 hours. Urine analysis:    Component Value Date/Time   COLORURINE AMBER (A) 10/02/2022 2005   COLORURINE AMBER (A) 10/02/2022 2005   APPEARANCEUR HAZY (A) 10/02/2022 2005   APPEARANCEUR HAZY (A) 10/02/2022 2005   APPEARANCEUR Clear 07/29/2022 1353   LABSPEC 1.019 10/02/2022 2005   LABSPEC 1.019 10/02/2022 2005   PHURINE 6.0 10/02/2022 2005   PHURINE 6.0 10/02/2022 2005   GLUCOSEU NEGATIVE 10/02/2022 2005   GLUCOSEU NEGATIVE 10/02/2022 2005   HGBUR NEGATIVE 10/02/2022 2005   HGBUR NEGATIVE 10/02/2022 2005   [place] NEGATIVE 10/02/2022 2005   BILIRUBINUR SMALL (A) 10/02/2022 2005   BILIRUBINUR Negative 07/29/2022 [place] 10/02/2022 2005   KETONESUR NEGATIVE 10/02/2022 2005   PROTEINUR 100 (A) 10/02/2022 2005   PROTEINUR 100 (A) 10/02/2022 2005   NITRITE NEGATIVE 10/02/2022 2005   NITRITE NEGATIVE 10/02/2022 2005   LEUKOCYTESUR TRACE (A) 10/02/2022 2005    LEUKOCYTESUR SMALL (A) 10/02/2022 2005   Sepsis Labs: Invalid input(s): "PROCALCITONIN", "LACTICIDVEN"  Microbiology: Recent Results (from the past 240 hour(s))  Blood Culture (routine x 2)     Status: Abnormal   Collection Time: 10/02/22  6:32 PM   Specimen: BLOOD  Result Value Ref Range Status   Specimen Description   Final    BLOOD LEFT ANTECUBITAL Performed at [facility], [address], [place], [place] [address]    Special Requests   Final    BOTTLES DRAWN AEROBIC AND ANAEROBIC Blood Culture adequate volume  Performed at [facility], [address] [place], [place] [address]    Culture  Setup Time   Final    Organism ID to follow [place] CRITICAL RESULT CALLED TO, READ BACK BY AND VERIFIED WITH: CAROLINE COULTER 10/03/22 1431 AMK Performed at [facility], [address] [place], [place] [address]    Culture STREPTOCOCCUS PNEUMONIAE (A)  Final   Report Status 10/05/2022 FINAL  Final   Organism ID, Bacteria STREPTOCOCCUS PNEUMONIAE  Final      Susceptibility   Streptococcus pneumoniae - MIC*    ERYTHROMYCIN <=0.12 SENSITIVE Sensitive     LEVOFLOXACIN 0.5 SENSITIVE Sensitive     VANCOMYCIN 0.5 SENSITIVE Sensitive     PENICILLIN (meningitis) <=0.06 SENSITIVE Sensitive     PENO - penicillin <=0.06      PENICILLIN (non-meningitis) <=0.06 SENSITIVE Sensitive     PENICILLIN (oral) <=0.06 SENSITIVE Sensitive     CEFTRIAXONE (non-meningitis) <=0.12 SENSITIVE Sensitive     CEFTRIAXONE (meningitis) <=0.12 SENSITIVE Sensitive     * STREPTOCOCCUS PNEUMONIAE  Blood Culture ID Panel (Reflexed)     Status: Abnormal   Collection Time: 10/02/22  6:32 PM  Result Value Ref Range Status   Enterococcus faecalis NOT DETECTED NOT DETECTED Final   Enterococcus Faecium NOT DETECTED NOT DETECTED Final   Listeria monocytogenes NOT DETECTED NOT DETECTED Final   Staphylococcus species NOT DETECTED NOT DETECTED Final    Staphylococcus aureus (BCID) NOT DETECTED NOT DETECTED Final   Staphylococcus epidermidis NOT DETECTED NOT DETECTED Final   Staphylococcus lugdunensis NOT DETECTED NOT DETECTED Final   Streptococcus species DETECTED (A) NOT DETECTED Final    Comment: CRITICAL RESULT CALLED TO, READ BACK BY AND VERIFIED WITH: CAROLINE COULTER 10/03/22 1431 AMK    Streptococcus agalactiae NOT DETECTED NOT DETECTED Final   Streptococcus pneumoniae DETECTED (A) NOT DETECTED Final    Comment: CRITICAL RESULT CALLED TO, READ BACK BY AND VERIFIED WITH: CAROLINE COULTER 10/03/22 1431 AMK    Streptococcus pyogenes NOT DETECTED NOT DETECTED Final   A.calcoaceticus-baumannii NOT DETECTED NOT DETECTED Final   Bacteroides fragilis NOT DETECTED NOT DETECTED Final   Enterobacterales NOT DETECTED NOT DETECTED Final   Enterobacter cloacae complex NOT DETECTED NOT DETECTED Final   Escherichia coli NOT DETECTED NOT DETECTED Final   Klebsiella aerogenes NOT DETECTED NOT DETECTED Final   Klebsiella oxytoca NOT DETECTED NOT DETECTED Final   Klebsiella pneumoniae NOT DETECTED NOT DETECTED Final   Proteus species NOT DETECTED NOT DETECTED Final   Salmonella species NOT DETECTED NOT DETECTED Final   Serratia marcescens NOT DETECTED NOT DETECTED Final   Haemophilus influenzae NOT DETECTED NOT DETECTED Final   Neisseria meningitidis NOT DETECTED NOT DETECTED Final   Pseudomonas aeruginosa NOT DETECTED NOT DETECTED Final   Stenotrophomonas maltophilia NOT DETECTED NOT DETECTED Final   Candida albicans NOT DETECTED NOT DETECTED Final   Candida auris NOT DETECTED NOT DETECTED Final   Candida glabrata NOT DETECTED NOT DETECTED Final   Candida krusei NOT DETECTED NOT DETECTED Final   Candida parapsilosis NOT DETECTED NOT DETECTED Final   Candida tropicalis NOT DETECTED NOT DETECTED Final   Cryptococcus neoformans/gattii NOT DETECTED NOT DETECTED Final    Comment: Performed at [facility], [address]  [place], [place] [address]  Blood Culture (routine x 2)     Status: None (Preliminary result)   Collection Time: 10/02/22  8:03 PM   Specimen: BLOOD RIGHT ARM  Result Value Ref Range Status  Specimen Description BLOOD RIGHT ARM  Final   Special Requests   Final    BOTTLES DRAWN AEROBIC AND ANAEROBIC Blood Culture results may not be optimal due to an inadequate volume of blood received in culture bottles   Culture   Final    NO GROWTH 3 DAYS Performed at [facility], [address] [place], [place] [address]    Report Status PENDING  Incomplete  Culture, blood (Routine X 2) w Reflex to ID Panel     Status: None (Preliminary result)   Collection Time: 10/04/22  8:43 AM   Specimen: BLOOD RIGHT HAND  Result Value Ref Range Status   Specimen Description BLOOD RIGHT HAND  Final   Special Requests   Final    BOTTLES DRAWN AEROBIC AND ANAEROBIC Blood Culture adequate volume   Culture   Final    NO GROWTH < 24 HOURS Performed at [facility], [address] [place], [place] [address]    Report Status PENDING  Incomplete  Culture, blood (Routine X 2) w Reflex to ID Panel     Status: None (Preliminary result)   Collection Time: 10/04/22 10:18 AM   Specimen: BLOOD RIGHT HAND  Result Value Ref Range Status   Specimen Description BLOOD RIGHT HAND  Final   Special Requests   Final    BOTTLES DRAWN AEROBIC AND ANAEROBIC Blood Culture adequate volume   Culture   Final    NO GROWTH < 24 HOURS Performed at [facility], [address], [place], [place] [address]    Report Status PENDING  Incomplete    Radiology Studies: ECHOCARDIOGRAM COMPLETE  Result Date: 10/04/2022    ECHOCARDIOGRAM REPORT   Patient Name:   ZAHAIRA LOWING Date of Exam: 10/04/2022 Medical Rec #:  [id number]        Height:       59.0 in Accession #:    [id number]       Weight:       120.1 lb Date of Birth:  1951-07-05         BSA:          1.485 m Patient Age:    62 years         BP:            131/120 mmHg Patient Gender: F                HR:           113 bpm. Exam Location:  [facility] Procedure: 2D Echo, Cardiac Doppler and Color Doppler Indications:     Bacteremia  History:         Patient has prior history of Echocardiogram examinations, most                  recent 08/10/2021. CAD and Previous Myocardial Infarction,                  Pulmonary HTN, Arrythmias:Atrial Fibrillation;                  Signs/Symptoms:Bacteremia and Chest Pain. Bladder CA.  Sonographer:     Wenda Low Referring Phys:  [id number] Charlesetta Ivory [facility] Diagnosing Phys: Kathlyn Sacramento MD IMPRESSIONS  1. Left ventricular ejection fraction, by estimation, is 45 to 50%. The left ventricle has mildly decreased function. Left ventricular endocardial border not optimally defined to evaluate regional wall motion. There is moderate left ventricular hypertrophy. Left ventricular diastolic parameters are indeterminate.  2.  Right ventricular systolic function is normal. The right ventricular size is normal. There is moderately elevated pulmonary artery systolic pressure.  3. Left atrial size was severely dilated.  4. Small vegetation on the mitral valve.  5. The mitral valve is abnormal. Moderate mitral valve regurgitation. No evidence of mitral stenosis. Severe mitral annular calcification.  6. Tricuspid valve regurgitation is moderate.  7. The aortic valve is normal in structure. Aortic valve regurgitation is trivial. Mild aortic valve stenosis. Aortic valve area, by VTI measures 1.43 cm. Aortic valve mean gradient measures 6.0 mmHg. Conclusion(s)/Recommendation(s): Findings concerning for mitral valve vegetation, would recommend a Transesophageal Echocardiogram for clarification. FINDINGS  Left Ventricle: Left ventricular ejection fraction, by estimation, is 45 to 50%. The left ventricle has mildly decreased function. Left ventricular endocardial border not optimally defined to evaluate regional wall motion. The left ventricular internal cavity  size was normal in size. There is moderate left ventricular hypertrophy. Left ventricular diastolic parameters are indeterminate. Right Ventricle: The right ventricular size is normal. No increase in right ventricular wall thickness. Right ventricular systolic function is normal. There is moderately elevated pulmonary artery systolic pressure. The tricuspid regurgitant velocity is 3.16 m/s, and with an assumed right atrial pressure of 8 mmHg, the estimated right ventricular systolic pressure is [id number] mmHg. Left Atrium: Left atrial size was severely dilated. Right Atrium: Right atrial size was normal in size. Pericardium: There is no evidence of pericardial effusion. Mitral Valve: The mitral valve is abnormal. There is moderate thickening of the mitral valve leaflet(s). There is moderate calcification of the mitral valve leaflet(s). Severe mitral annular calcification. Moderate mitral valve regurgitation. No evidence  of mitral valve stenosis. MV peak gradient, 16.2 mmHg. The mean mitral valve gradient is 7.0 mmHg. Tricuspid Valve: The tricuspid valve is normal in structure. Tricuspid valve regurgitation is moderate . No evidence of tricuspid stenosis. Aortic Valve: The aortic valve is normal in structure. Aortic valve regurgitation is trivial. Mild aortic stenosis is present. Aortic valve mean gradient measures 6.0 mmHg. Aortic valve peak gradient measures 12.8 mmHg. Aortic valve area, by VTI measures  1.43 cm. Pulmonic Valve: The pulmonic valve was normal in structure. Pulmonic valve regurgitation is trivial. No evidence of pulmonic stenosis. Aorta: The aortic root is normal in size and structure. Venous: The inferior vena cava was not well visualized. IAS/Shunts: No atrial level shunt detected by color flow Doppler.  LEFT VENTRICLE PLAX 2D LVIDd:         3.50 cm LVIDs:         2.70 cm LV PW:         1.20 cm LV IVS:        1.30 cm LVOT diam:     1.90 cm LV SV:         39 LV SV Index:   27 LVOT Area:     2.84 cm   RIGHT VENTRICLE RV Basal diam:  3.20 cm RV Mid diam:    2.40 cm RV S prime:     13.50 cm/s LEFT ATRIUM             Index        RIGHT ATRIUM           Index LA diam:        5.00 cm 3.37 cm/m   RA Area:     14.50 cm LA Vol (A2C):   69.7 ml 46.93 ml/m  RA Volume:   37.90 ml  25.52 ml/m LA Vol (A4C):  86.6 ml 58.31 ml/m LA Biplane Vol: 79.4 ml 53.46 ml/m  AORTIC VALVE                     PULMONIC VALVE AV Area (Vmax):    1.71 cm      PV Vmax:       1.01 m/s AV Area (Vmean):   1.53 cm      PV Peak grad:  4.1 mmHg AV Area (VTI):     1.43 cm AV Vmax:           179.00 cm/s AV Vmean:          112.000 cm/s AV VTI:            0.275 m AV Peak Grad:      12.8 mmHg AV Mean Grad:      6.0 mmHg LVOT Vmax:         108.00 cm/s LVOT Vmean:        60.300 cm/s LVOT VTI:          0.139 m LVOT/AV VTI ratio: 0.51  AORTA Ao Root diam: 3.20 cm Ao Asc diam:  2.70 cm MITRAL VALVE                TRICUSPID VALVE MV Area (PHT): 2.91 cm     TR Peak grad:   39.9 mmHg MV Area VTI:   1.68 cm     TR Vmax:        316.00 cm/s MV Peak grad:  16.2 mmHg MV Mean grad:  7.0 mmHg     SHUNTS MV Vmax:       2.01 m/s     Systemic VTI:  0.14 m MV Vmean:      116.0 cm/s   Systemic Diam: 1.90 cm MV Decel Time: 261 msec MV E velocity: 159.00 cm/s Kathlyn Sacramento MD Electronically signed by Kathlyn Sacramento MD Signature Date/Time: 10/04/2022/5:20:56 PM    Final    DG Chest Port 1 View  Result Date: 10/04/2022 CLINICAL DATA:  Shortness of breath. EXAM: PORTABLE CHEST 1 VIEW COMPARISON:  Chest radiograph and CT 10/02/2022 FINDINGS: The patient is rotated to the right. The cardiac silhouette is borderline enlarged. There is extensive mitral annular calcification. Lung volumes are low with mild interstitial densities in the lung bases. No overt pulmonary edema, lobar consolidation, sizeable pleural effusion, or pneumothorax is identified. Advanced bilateral glenohumeral arthropathy is again noted. IMPRESSION: Low lung volumes with mild bibasilar opacities  which could reflect atelectasis or early infection. Electronically Signed   By: Logan Bores M.D.   On: 10/04/2022 15:15      Tita Terhaar T. Chitina  If 7PM-7AM, please contact night-coverage [email or web address] 10/05/2022, 12:58 PM

## 2022-10-06 DIAGNOSIS — R6520 Severe sepsis without septic shock: Secondary | ICD-10-CM | POA: Diagnosis not present

## 2022-10-06 DIAGNOSIS — K819 Cholecystitis, unspecified: Principal | ICD-10-CM | POA: Insufficient documentation

## 2022-10-06 DIAGNOSIS — K7460 Unspecified cirrhosis of liver: Secondary | ICD-10-CM | POA: Insufficient documentation

## 2022-10-06 DIAGNOSIS — K7469 Other cirrhosis of liver: Secondary | ICD-10-CM

## 2022-10-06 DIAGNOSIS — K810 Acute cholecystitis: Secondary | ICD-10-CM

## 2022-10-06 DIAGNOSIS — Z515 Encounter for palliative care: Secondary | ICD-10-CM | POA: Diagnosis not present

## 2022-10-06 DIAGNOSIS — E871 Hypo-osmolality and hyponatremia: Secondary | ICD-10-CM | POA: Insufficient documentation

## 2022-10-06 DIAGNOSIS — I959 Hypotension, unspecified: Secondary | ICD-10-CM

## 2022-10-06 DIAGNOSIS — D649 Anemia, unspecified: Secondary | ICD-10-CM

## 2022-10-06 DIAGNOSIS — E8809 Other disorders of plasma-protein metabolism, not elsewhere classified: Secondary | ICD-10-CM | POA: Insufficient documentation

## 2022-10-06 DIAGNOSIS — A419 Sepsis, unspecified organism: Secondary | ICD-10-CM | POA: Diagnosis not present

## 2022-10-06 LAB — CBC
HCT: 29 % — ABNORMAL LOW (ref 36.0–46.0)
Hemoglobin: 9.7 g/dL — ABNORMAL LOW (ref 12.0–15.0)
MCH: 34.8 pg — ABNORMAL HIGH (ref 26.0–34.0)
MCHC: 33.4 g/dL (ref 30.0–36.0)
MCV: 103.9 fL — ABNORMAL HIGH (ref 80.0–100.0)
Platelets: 196 10*3/uL (ref 150–400)
RBC: 2.79 MIL/uL — ABNORMAL LOW (ref 3.87–5.11)
RDW: 14.4 % (ref 11.5–15.5)
WBC: 5.2 10*3/uL (ref 4.0–10.5)
nRBC: 0 % (ref 0.0–0.2)

## 2022-10-06 LAB — PROTIME-INR
INR: 2.1 — ABNORMAL HIGH (ref 0.8–1.2)
Prothrombin Time: 23.7 seconds — ABNORMAL HIGH (ref 11.4–15.2)

## 2022-10-06 LAB — APTT
aPTT: 136 seconds — ABNORMAL HIGH (ref 24–36)
aPTT: 65 seconds — ABNORMAL HIGH (ref 24–36)

## 2022-10-06 LAB — COMPREHENSIVE METABOLIC PANEL
ALT: 14 U/L (ref 0–44)
AST: 41 U/L (ref 15–41)
Albumin: 1.5 g/dL — ABNORMAL LOW (ref 3.5–5.0)
Alkaline Phosphatase: 62 U/L (ref 38–126)
Anion gap: 6 (ref 5–15)
BUN: 17 mg/dL (ref 8–23)
CO2: 22 mmol/L (ref 22–32)
Calcium: 7.6 mg/dL — ABNORMAL LOW (ref 8.9–10.3)
Chloride: 104 mmol/L (ref 98–111)
Creatinine, Ser: 1.02 mg/dL — ABNORMAL HIGH (ref 0.44–1.00)
GFR, Estimated: 59 mL/min — ABNORMAL LOW (ref >60)
Glucose, Bld: 90 mg/dL (ref 70–99)
Potassium: 3.7 mmol/L (ref 3.5–5.1)
Sodium: 132 mmol/L — ABNORMAL LOW (ref 135–145)
Total Bilirubin: 1.5 mg/dL — ABNORMAL HIGH (ref 0.3–1.2)
Total Protein: 5.6 g/dL — ABNORMAL LOW (ref 6.5–8.1)

## 2022-10-06 LAB — PHOSPHORUS: Phosphorus: 2.6 mg/dL (ref 2.5–4.6)

## 2022-10-06 LAB — HEPARIN LEVEL (UNFRACTIONATED): Heparin Unfractionated: >1.1 IU/mL — ABNORMAL HIGH (ref 0.30–0.70)

## 2022-10-06 LAB — MAGNESIUM: Magnesium: 1.8 mg/dL (ref 1.7–2.4)

## 2022-10-06 MED ORDER — METOPROLOL TARTRATE 50 MG PO TABS
50.0000 mg | ORAL_TABLET | Freq: Two times a day (BID) | ORAL | Status: DC
  Administered 2022-10-06 – 2022-10-12 (×11): 50 mg via ORAL
  Filled 2022-10-06 (×12): qty 1

## 2022-10-06 MED ORDER — DILTIAZEM HCL 30 MG PO TABS
60.0000 mg | ORAL_TABLET | Freq: Two times a day (BID) | ORAL | Status: DC
  Administered 2022-10-06 – 2022-10-12 (×12): 60 mg via ORAL
  Filled 2022-10-06 (×12): qty 2

## 2022-10-06 MED ORDER — HEPARIN (PORCINE) 25000 UT/250ML-% IV SOLN
600.0000 [IU]/h | INTRAVENOUS | Status: DC
  Administered 2022-10-06: 550 [IU]/h via INTRAVENOUS
  Administered 2022-10-07: 650 [IU]/h via INTRAVENOUS
  Filled 2022-10-06: qty 250

## 2022-10-06 MED ORDER — ORAL CARE MOUTH RINSE: 15.0000 mL | OROMUCOSAL | Status: DC | PRN

## 2022-10-06 NOTE — Progress Notes (Signed)
Progress Note   Patient: Kristina Ellis [id number] DOB: 1950/11/10 DOA: 10/02/2022     4 DOS: the patient was seen and examined on 10/06/2022   Brief hospital course: 72 year old F with PMH of A-fib on Eliquis, cholelithiasis, bladder cancer, HTN, CAD and hepatitis presenting with RUQ pain, nausea and vomiting.  Workup including CT abdomen and pelvis and MRCP concerning for acute calculus cholecystitis, hepatic cirrhosis and heterogeneous hepatic steatosis.  General surgery consulted and recommended percutaneous drain but IR feels patient is a poor candidate for percutaneous cholecystostomy due to cirrhosis and ascites with increased risk of bleeding and bacterial peritonitis, and recommended conservative management.  Patient is not interested in surgical intervention either.  Patient is on IV Zosyn.  General surgery following.   Blood culture with Streptococcus pneumonia.  Echocardiogram concerning for mitral valve vegetation.  ID consulted and following.   Patient is also in A-fib with RVR.  Started on amiodarone drip.   2/28.  Patient declined cholecystectomy tube with the general surgery team.  Still having abdominal pain.  Assessment and Plan: * Severe sepsis ([facility]) Present on admission, with Streptococcus pneumonia growing out of blood cultures.  Seen by infectious disease and recommended 4 to 6 weeks of IV antibiotics.  Currently on Unasyn.  Acute cholecystitis Patient declined cholecystectomy tube placement.  Currently on Unasyn.  General surgery following.  Not a great surgical candidate.  Clear liquid diet.  Atrial fibrillation with rapid ventricular response (HCC) This morning was on amiodarone drip and now currently on metoprolol and Cardizem.  Continue heparin drip.  Hypotension Continue midodrine.  Bladder cancer [facility]) Will need follow-up as outpatient.  Anemia Will check iron studies tomorrow.  Hemoglobin 9.7 today.  Hypoalbuminemia Albumin  1.5.  Hyponatremia Sodium 132  Liver cirrhosis (HCC) Seen on imaging.  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  Does not want a cholecystectomy tube.  Admitted with sepsis and acute cholecystitis.  Physical Exam: Vitals:   10/06/22 0800 10/06/22 1000 10/06/22 1143 10/06/22 1200  BP: 138/70 (!) 143/85 121/80 137/89  Pulse: (!) 59 64  75  Resp: 17 (!) 24  15  Temp:   98 F (36.7 C) 98.2 F (36.8 C)  TempSrc:   Oral   SpO2: 100% 100%  94%  Weight:      Height:       Physical Exam HENT:     Head: Normocephalic.     Mouth/Throat:     Pharynx: No oropharyngeal exudate.  Eyes:     General: Lids are normal.     Conjunctiva/sclera: Conjunctivae normal.  Cardiovascular:     Rate and Rhythm: Normal rate. Rhythm irregularly irregular.     Heart sounds: Normal heart sounds, S1 normal and S2 normal.  Pulmonary:     Breath sounds: No decreased breath sounds, wheezing, rhonchi or rales.  Abdominal:     Palpations: Abdomen is soft.     Tenderness: There is abdominal tenderness in the epigastric area.  Musculoskeletal:     Right lower leg: No swelling.     Left lower leg: No swelling.  Skin:    General: Skin is warm.     Findings: No rash.  Neurological:     Mental Status: She is alert and oriented to person, place, and time.     Data Reviewed: Albumin 1.5.  Sodium 132, total bilirubin 1.5, hemoglobin  9.7, platelet count 196  Family Communication: Spoke with patient's sister on the phone  Disposition: Status is: Inpatient Remains inpatient appropriate because: Will need long-term IV antibiotics.  Planned Discharge Destination: To be determined    Time spent: 28 minutes  Author: Loletha Grayer, MD 10/06/2022 2:49 PM  For on call review [email or web address].

## 2022-10-06 NOTE — Progress Notes (Signed)
PT Cancellation Note  Patient Details Name: Kristina Ellis MRN: [id number] DOB: 1950-11-05   Cancelled Treatment:     Pt on amiodarone IV drip earlier, now only receiving IV heparin, cleared by nursing for PT session, however pt refused mobility with this therapist and NT. Will continue PT per POC as pt willing next available date/time.   Josie Dixon 10/06/2022, 3:06 PM

## 2022-10-06 NOTE — Assessment & Plan Note (Addendum)
Will need follow-up as outpatient.  Kristina Ellis as outpatient Dr. Grayland Ormond from oncology is trying to arrange follow-up within 1 week of discharge

## 2022-10-06 NOTE — Progress Notes (Signed)
Kristina Ellis  [phone] [phone]  ID: Kristina Ellis OB: [birth year]  MR#: [id number]  [id number]  Patient Care Team: [facility] [place] as PCP - General (General Practice) Lloyd Huger, MD as Consulting Physician (Hematology and Oncology)  CHIEF COMPLAINT: Sepsis secondary to cholecystitis, stage IV bladder cancer.  INTERVAL HISTORY: Patient recent admitted to hospital with increasing abdominal pain and found to have sepsis secondary to cholecystitis.  Both surgery, infectious disease, and interventional radiology are recommending conservative measures with 4 to 6 weeks of antibiotics.  Patient also declining any invasive procedures.  She feels improved since admission.  REVIEW OF SYSTEMS:   Review of Systems  Constitutional:  Positive for malaise/fatigue. Negative for fever and weight loss.  Respiratory: Negative.  Negative for cough, hemoptysis and shortness of breath.   Cardiovascular: Negative.  Negative for leg swelling.  Gastrointestinal:  Positive for abdominal pain. Negative for constipation and diarrhea.  Genitourinary: Negative.  Negative for dysuria.  Musculoskeletal: Negative.  Negative for back pain.  Skin: Negative.  Negative for rash.  Neurological:  Positive for weakness. Negative for dizziness, focal weakness and headaches.  Psychiatric/Behavioral: Negative.  The patient is not nervous/anxious.     As per HPI. Otherwise, a complete review of systems is negative.  PAST MEDICAL HISTORY: Past Medical History:  Diagnosis Date   Aortic atherosclerosis ([place])    Arthritis    Atrial fibrillation ([place])    a.) s/p TEE with cardioversion on 02/04/2020. b.) on daily apixaban.   Bladder mass    a.) CT 04/08/2021 --> 3.2 cm intraluminal bladder mass.   Carotid stenosis, bilateral    a.) Doppler 07/04/2020 --> mild; 1-49% stenosis BILATERALLY.   Cholelithiasis    a.) CT 11/06/2020 --> largest measured  4.5 cm   Chronic anticoagulation    a.) Apixaban   Common biliary duct calculus    a.) CT 04/08/2021 --> CBD dilated at 8 mm; 70m calculus within distal CBD.   Coronary artery disease involving native coronary artery of native heart without angina pectoris [id number]  Diastolic dysfunction    a.) TTE 07/04/2020 --> LVEF 60-65%; G1DD.   Hepatic cirrhosis (H[facility])    Hepatic steatosis    Hepatitis 1970   History of 2019 novel coronavirus disease (COVID-19) 12/20/2019   History of marijuana use    Hypertension    Hypertensive retinopathy    IDA (iron deficiency anemia)    Mitral stenosis    a.) TEE 04/10/2020 --> mild. b.) TTE 07/04/2020 --> EF 60-65%; mild (mean gradient 5 mmHg).   Moderate pulmonary arterial systolic hypertension ([facility] 12/22/2019   Nephrolithiasis    NSTEMI (non-ST elevated myocardial infarction) ([facility] 07/26/2016   Open-angle glaucoma 09/11/2010   PAH (pulmonary artery hypertension) ([facility]    a.) TTE 12/21/2019 --> PASP 44 mmHg.   Uterine fibroid    a.) CT 04/08/2021 --> multiple with largest measuring 7 cm.   Valvular regurgitation    a.) TTE 09/01/2010 --> trivial to mild pan-valvular. b.) TTE 12/21/2019 --> mild MR and AR; moderate TR. c.) TTE 07/04/2020 --> mild TR; trivial MR and PR.   Vestibular neuronitis     PAST SURGICAL HISTORY: Past Surgical History:  Procedure Laterality Date   BREAST CYST ASPIRATION Left    COLONOSCOPY  2012   ERCP N/A 12/18/2021   Procedure: ENDOSCOPIC RETROGRADE CHOLANGIOPANCREATOGRAPHY (ERCP);  Surgeon: WLucilla Lame MD;  [facility]  Service: Endoscopy;  Laterality: N/A;   TEE WITH CARDIOVERSION N/A  02/04/2020   Procedure: TEE WITH CARDIOVERSION; Location: [facility]; Surgeon: Kandis Cocking, MD   TRANSURETHRAL RESECTION OF BLADDER TUMOR N/A 05/15/2021   Procedure: TRANSURETHRAL RESECTION OF BLADDER TUMOR (TURBT);  Surgeon: Billey Co, MD;  Location: [facility];  Service: Urology;  Laterality: N/A;    FAMILY HISTORY: Family  History  Problem Relation Age of Onset   Aneurysm Mother    Colon cancer Father    Lung cancer Brother    Breast cancer Neg Hx     ADVANCED DIRECTIVES (Y/N):  '@ADVDIR'$ @  HEALTH MAINTENANCE: Social History   Tobacco Use   Smoking status: Former    Packs/day: 0.10    Years: 10.00    Total pack years: 1.00    Types: Cigarettes    Passive exposure: Past   Smokeless tobacco: Never   Tobacco comments:    occasional smoker  Vaping Use   Vaping Use: Never used  Substance Use Topics   Alcohol use: Yes    Alcohol/week: 5.0 standard drinks of alcohol    Types: 5 Cans of beer per week    Comment: weekly   Drug use: Not Currently    Types: 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, 72's     Colonoscopy:  PAP:  Bone density:  Lipid panel:  Allergies  Allergen Reactions   Atenolol Other (See Comments)    bradycardia bradycardia     Current Facility-Administered Medications  Medication Dose Route Frequency Provider Last Rate Last Admin   Ampicillin-Sulbactam (UNASYN) 3 g in sodium chloride 0.9 % 100 mL IVPB  3 g Intravenous Q6H Ravishankar, Jayashree, MD 200 mL/hr at 10/06/22 1710 3 g at 10/06/22 1710   diltiazem (CARDIZEM) tablet 60 mg  60 mg Oral Q12H Tang, Alanson Puls, PA-C       heparin ADULT infusion 100 units/mL (25000 units/286m)  650 Units/hr Intravenous Continuous CMickeal SkinnerA, RPH 6.5 mL/hr at 10/06/22 1710 650 Units/hr at 10/06/22 1710   loperamide (IMODIUM) capsule 2 mg  2 mg Oral PRN GWendee BeaversT, MD   2 mg at 10/06/22 0313   metoprolol tartrate (LOPRESSOR) tablet 50 mg  50 mg Oral BID Tang, LAlanson Puls PA-C       midodrine (PROAMATINE) tablet 5 mg  5 mg Oral TID WC GWendee BeaversT, MD   5 mg at 10/06/22 1708   morphine (PF) 2 MG/ML injection 2 mg  2 mg Intravenous Q2H PRN GElwyn Reach MD   2 mg at 10/05/22 2315   ondansetron (ZOFRAN) tablet 4 mg  4 mg Oral Q6H PRN GElwyn Reach MD       Or   ondansetron (ZOFRAN) injection 4 mg  4 mg Intravenous  Q6H PRN GElwyn Reach MD   4 mg at 10/04/22 1049   Oral care mouth rinse  15 mL Mouth Rinse PRN GMercy Riding MD       Facility-Administered Medications Ordered in Other Encounters  Medication Dose Route Frequency Provider Last Rate Last Admin   heparin lock flush 100 unit/mL  500 Units Intracatheter Once PRN FLloyd Huger MD       sodium chloride flush (NS) 0.9 % injection 10 mL  10 mL Intracatheter PRN FLloyd Huger MD        OBJECTIVE: Vitals:   10/06/22 1400 10/06/22 1500  BP: (!) 147/76 (!) 155/130  Pulse: 77 85  Resp: (!) 22 18  Temp:  99 F (37.2 C)  SpO2: 97% 99%  Body mass index is 24.27 kg/m.    ECOG FS:2 - Symptomatic, <50% confined to bed  General: Thin, no acute distress. Eyes: Pink conjunctiva, anicteric sclera. HEENT: Normocephalic, moist mucous membranes. Lungs: No audible wheezing or coughing. Heart: Regular rate and rhythm. Abdomen: Soft, no obvious distention. Musculoskeletal: No edema, cyanosis, or clubbing. Neuro: Alert, answering all questions appropriately. Cranial nerves grossly intact. Skin: No rashes or petechiae noted. Psych: Normal affect.  LAB RESULTS:  Lab Results  Component Value Date   NA 132 (L) 10/06/2022   K 3.7 10/06/2022   CL 104 10/06/2022   CO2 22 10/06/2022   GLUCOSE 90 10/06/2022   BUN 17 10/06/2022   CREATININE 1.02 (H) 10/06/2022   CALCIUM 7.6 (L) 10/06/2022   PROT 5.6 (L) 10/06/2022   ALBUMIN 1.5 (L) 10/06/2022   AST 41 10/06/2022   ALT 14 10/06/2022   ALKPHOS 62 10/06/2022   BILITOT 1.5 (H) 10/06/2022   GFRNONAA 59 (L) 10/06/2022   GFRAA >60 03/07/2018    Lab Results  Component Value Date   WBC 5.2 10/06/2022   NEUTROABS 8.3 (H) 10/04/2022   HGB 9.7 (L) 10/06/2022   HCT 29.0 (L) 10/06/2022   MCV 103.9 (H) 10/06/2022   PLT 196 10/06/2022     STUDIES: ECHOCARDIOGRAM COMPLETE  Result Date: 10/04/2022    ECHOCARDIOGRAM REPORT   Patient Name:   WANONA YAP Date of Exam: 10/04/2022  Medical Rec #:  [id number]        Height:       59.0 in Accession #:    [id number]       Weight:       120.1 lb Date of Birth:  1951/01/06         BSA:          1.485 m Patient Age:    89 years         BP:           131/120 mmHg Patient Gender: F                HR:           113 bpm. Exam Location:  [facility] Procedure: 2D Echo, Cardiac Doppler and Color Doppler Indications:     Bacteremia  History:         Patient has prior history of Echocardiogram examinations, most                  recent 08/10/2021. CAD and Previous Myocardial Infarction,                  Pulmonary HTN, Arrythmias:Atrial Fibrillation;                  Signs/Symptoms:Bacteremia and Chest Pain. Bladder CA.  Sonographer:     Wenda Low Referring Phys:  [id number] Charlesetta Ivory GONFA Diagnosing Phys: Kathlyn Sacramento MD IMPRESSIONS  1. Left ventricular ejection fraction, by estimation, is 45 to 50%. The left ventricle has mildly decreased function. Left ventricular endocardial border not optimally defined to evaluate regional wall motion. There is moderate left ventricular hypertrophy. Left ventricular diastolic parameters are indeterminate.  2. Right ventricular systolic function is normal. The right ventricular size is normal. There is moderately elevated pulmonary artery systolic pressure.  3. Left atrial size was severely dilated.  4. Small vegetation on the mitral valve.  5. The mitral valve is abnormal. Moderate mitral valve regurgitation. No evidence of mitral stenosis. Severe mitral annular calcification.  6. Tricuspid valve  regurgitation is moderate.  7. The aortic valve is normal in structure. Aortic valve regurgitation is trivial. Mild aortic valve stenosis. Aortic valve area, by VTI measures 1.43 cm. Aortic valve mean gradient measures 6.0 mmHg. Conclusion(s)/Recommendation(s): Findings concerning for mitral valve vegetation, would recommend a Transesophageal Echocardiogram for clarification. FINDINGS  Left Ventricle: Left ventricular ejection  fraction, by estimation, is 45 to 50%. The left ventricle has mildly decreased function. Left ventricular endocardial border not optimally defined to evaluate regional wall motion. The left ventricular internal cavity size was normal in size. There is moderate left ventricular hypertrophy. Left ventricular diastolic parameters are indeterminate. Right Ventricle: The right ventricular size is normal. No increase in right ventricular wall thickness. Right ventricular systolic function is normal. There is moderately elevated pulmonary artery systolic pressure. The tricuspid regurgitant velocity is 3.16 m/s, and with an assumed right atrial pressure of 8 mmHg, the estimated right ventricular systolic pressure is [id number] mmHg. Left Atrium: Left atrial size was severely dilated. Right Atrium: Right atrial size was normal in size. Pericardium: There is no evidence of pericardial effusion. Mitral Valve: The mitral valve is abnormal. There is moderate thickening of the mitral valve leaflet(s). There is moderate calcification of the mitral valve leaflet(s). Severe mitral annular calcification. Moderate mitral valve regurgitation. No evidence  of mitral valve stenosis. MV peak gradient, 16.2 mmHg. The mean mitral valve gradient is 7.0 mmHg. Tricuspid Valve: The tricuspid valve is normal in structure. Tricuspid valve regurgitation is moderate . No evidence of tricuspid stenosis. Aortic Valve: The aortic valve is normal in structure. Aortic valve regurgitation is trivial. Mild aortic stenosis is present. Aortic valve mean gradient measures 6.0 mmHg. Aortic valve peak gradient measures 12.8 mmHg. Aortic valve area, by VTI measures  1.43 cm. Pulmonic Valve: The pulmonic valve was normal in structure. Pulmonic valve regurgitation is trivial. No evidence of pulmonic stenosis. Aorta: The aortic root is normal in size and structure. Venous: The inferior vena cava was not well visualized. IAS/Shunts: No atrial level shunt detected by  color flow Doppler.  LEFT VENTRICLE PLAX 2D LVIDd:         3.50 cm LVIDs:         2.70 cm LV PW:         1.20 cm LV IVS:        1.30 cm LVOT diam:     1.90 cm LV SV:         39 LV SV Index:   27 LVOT Area:     2.84 cm  RIGHT VENTRICLE RV Basal diam:  3.20 cm RV Mid diam:    2.40 cm RV S prime:     13.50 cm/s LEFT ATRIUM             Index        RIGHT ATRIUM           Index LA diam:        5.00 cm 3.37 cm/m   RA Area:     14.50 cm LA Vol (A2C):   69.7 ml 46.93 ml/m  RA Volume:   37.90 ml  25.52 ml/m LA Vol (A4C):   86.6 ml 58.31 ml/m LA Biplane Vol: 79.4 ml 53.46 ml/m  AORTIC VALVE                     PULMONIC VALVE AV Area (Vmax):    1.71 cm      PV Vmax:       1.01 m/s  AV Area (Vmean):   1.53 cm      PV Peak grad:  4.1 mmHg AV Area (VTI):     1.43 cm AV Vmax:           179.00 cm/s AV Vmean:          112.000 cm/s AV VTI:            0.275 m AV Peak Grad:      12.8 mmHg AV Mean Grad:      6.0 mmHg LVOT Vmax:         108.00 cm/s LVOT Vmean:        60.300 cm/s LVOT VTI:          0.139 m LVOT/AV VTI ratio: 0.51  AORTA Ao Root diam: 3.20 cm Ao Asc diam:  2.70 cm MITRAL VALVE                TRICUSPID VALVE MV Area (PHT): 2.91 cm     TR Peak grad:   39.9 mmHg MV Area VTI:   1.68 cm     TR Vmax:        316.00 cm/s MV Peak grad:  16.2 mmHg MV Mean grad:  7.0 mmHg     SHUNTS MV Vmax:       2.01 m/s     Systemic VTI:  0.14 m MV Vmean:      116.0 cm/s   Systemic Diam: 1.90 cm MV Decel Time: 261 msec MV E velocity: 159.00 cm/s Kathlyn Sacramento MD Electronically signed by Kathlyn Sacramento MD Signature Date/Time: 10/04/2022/5:20:56 PM    Final    DG Chest Port 1 View  Result Date: 10/04/2022 CLINICAL DATA:  Shortness of breath. EXAM: PORTABLE CHEST 1 VIEW COMPARISON:  Chest radiograph and CT 10/02/2022 FINDINGS: The patient is rotated to the right. The cardiac silhouette is borderline enlarged. There is extensive mitral annular calcification. Lung volumes are low with mild interstitial densities in the lung bases. No  overt pulmonary edema, lobar consolidation, sizeable pleural effusion, or pneumothorax is identified. Advanced bilateral glenohumeral arthropathy is again noted. IMPRESSION: Low lung volumes with mild bibasilar opacities which could reflect atelectasis or early infection. Electronically Signed   By: Logan Bores M.D.   On: 10/04/2022 15:15   MR ABDOMEN MRCP W WO CONTAST  Result Date: 10/03/2022 CLINICAL DATA:  72 year old female with suspected biliary obstruction. EXAM: MRI ABDOMEN WITHOUT AND WITH CONTRAST (INCLUDING MRCP) TECHNIQUE: Multiplanar multisequence MR imaging of the abdomen was performed both before and after the administration of intravenous contrast. Heavily T2-weighted images of the biliary and pancreatic ducts were obtained, and three-dimensional MRCP images were rendered by post processing. CONTRAST:  7m GADAVIST GADOBUTROL 1 MMOL/ML IV SOLN COMPARISON:  Abdominal MRI 04/11/2022. Right upper quadrant abdominal ultrasound 10/03/2022. CT of the abdomen and pelvis 10/02/2022. FINDINGS: Lower chest: Small right pleural effusion lying dependently. Hepatobiliary: Diffuse but heterogeneous loss of signal intensity throughout the hepatic parenchyma on out of phase dual echo images, indicative of a background of heterogeneous hepatic steatosis. Liver has a shrunken appearance and very nodular contour, indicative of underlying cirrhosis. No definite suspicious cystic or solid hepatic lesions are confidently identified. No intra or extrahepatic biliary ductal dilatation noted on MRCP images. Common bile duct measures 6 mm in the porta hepatis. No filling defect within the common bile duct to suggest choledocholithiasis. However, there is heterogeneous signal intensity within the lumen of the gallbladder, most notable for a large signal void which measures up to  4.5 x 3.0 cm, compatible with a large stone. Gallbladder wall is irregularly thickened and edematous measuring up to 1 cm, with surrounding  pericholecystic fluid and inflammatory changes. Pancreas: No pancreatic mass. No pancreatic ductal dilatation. No pancreatic or peripancreatic fluid collections or inflammatory changes. Spleen:  Unremarkable. Adrenals/Urinary Tract: Bilateral kidneys and bilateral adrenal glands are normal in appearance. No hydroureteronephrosis in the visualized portions of the abdomen. Stomach/Bowel: Visualized portions are unremarkable. Vascular/Lymphatic: No aneurysm identified in the visualized abdominal vasculature. No lymphadenopathy noted in the abdomen. Other: Trace volume of ascites. Inflammatory changes centered around the gallbladder in the right upper quadrant of the abdomen. Incidental imaging of the pelvis demonstrates an enlarged uterus with multiple lesions, presumably fibroids. Musculoskeletal: No aggressive appearing osseous lesions are noted in the visualized portions of the skeleton. IMPRESSION: 1. Cholelithiasis with imaging findings highly concerning for acute cholecystitis. Surgical consultation is recommended. 2. No choledocholithiasis or findings of biliary tract obstruction. 3. Hepatic cirrhosis and heterogeneous hepatic steatosis. No aggressive appearing hepatic lesion noted at this time. 4. Trace volume of ascites. 5. Small right pleural effusion lying dependently. Electronically Signed   By: Vinnie Langton M.D.   On: 10/03/2022 05:55   MR 3D Recon At Scanner  Result Date: 10/03/2022 CLINICAL DATA:  72 year old female with suspected biliary obstruction. EXAM: MRI ABDOMEN WITHOUT AND WITH CONTRAST (INCLUDING MRCP) TECHNIQUE: Multiplanar multisequence MR imaging of the abdomen was performed both before and after the administration of intravenous contrast. Heavily T2-weighted images of the biliary and pancreatic ducts were obtained, and three-dimensional MRCP images were rendered by post processing. CONTRAST:  13m GADAVIST GADOBUTROL 1 MMOL/ML IV SOLN COMPARISON:  Abdominal MRI 04/11/2022. Right upper  quadrant abdominal ultrasound 10/03/2022. CT of the abdomen and pelvis 10/02/2022. FINDINGS: Lower chest: Small right pleural effusion lying dependently. Hepatobiliary: Diffuse but heterogeneous loss of signal intensity throughout the hepatic parenchyma on out of phase dual echo images, indicative of a background of heterogeneous hepatic steatosis. Liver has a shrunken appearance and very nodular contour, indicative of underlying cirrhosis. No definite suspicious cystic or solid hepatic lesions are confidently identified. No intra or extrahepatic biliary ductal dilatation noted on MRCP images. Common bile duct measures 6 mm in the porta hepatis. No filling defect within the common bile duct to suggest choledocholithiasis. However, there is heterogeneous signal intensity within the lumen of the gallbladder, most notable for a large signal void which measures up to 4.5 x 3.0 cm, compatible with a large stone. Gallbladder wall is irregularly thickened and edematous measuring up to 1 cm, with surrounding pericholecystic fluid and inflammatory changes. Pancreas: No pancreatic mass. No pancreatic ductal dilatation. No pancreatic or peripancreatic fluid collections or inflammatory changes. Spleen:  Unremarkable. Adrenals/Urinary Tract: Bilateral kidneys and bilateral adrenal glands are normal in appearance. No hydroureteronephrosis in the visualized portions of the abdomen. Stomach/Bowel: Visualized portions are unremarkable. Vascular/Lymphatic: No aneurysm identified in the visualized abdominal vasculature. No lymphadenopathy noted in the abdomen. Other: Trace volume of ascites. Inflammatory changes centered around the gallbladder in the right upper quadrant of the abdomen. Incidental imaging of the pelvis demonstrates an enlarged uterus with multiple lesions, presumably fibroids. Musculoskeletal: No aggressive appearing osseous lesions are noted in the visualized portions of the skeleton. IMPRESSION: 1. Cholelithiasis  with imaging findings highly concerning for acute cholecystitis. Surgical consultation is recommended. 2. No choledocholithiasis or findings of biliary tract obstruction. 3. Hepatic cirrhosis and heterogeneous hepatic steatosis. No aggressive appearing hepatic lesion noted at this time. 4. Trace volume of ascites. 5. Small  right pleural effusion lying dependently. Electronically Signed   By: Vinnie Langton M.D.   On: 10/03/2022 05:55   US ABDOMEN LIMITED RUQ (LIVER/GB)  Result Date: 10/03/2022 CLINICAL DATA:  Abdominal pain. EXAM: ULTRASOUND ABDOMEN LIMITED RIGHT UPPER QUADRANT COMPARISON:  April 11, 2022 FINDINGS: Gallbladder: Large echogenic gallstones are seen within the lumen of a distended gallbladder (the largest measures approximately 3.0 cm). This is seen on the prior study. Stable gallbladder wall thickening (5.4 mm) and pericholecystic fluid are also noted. The presence or absence of a sonographic Percell Miller sign was not provided by the sonographer. Common bile duct: Diameter: 10.1 mm (measured 9.2 mm on the prior study) Liver: A 1.0 cm x 1.1 cm x 1.0 cm hypoechoic area is seen within the left lobe of the liver. No flow is noted within this region on color Doppler evaluation. The liver parenchyma is nodular in contour and diffusely increased in echogenicity. Portal vein is patent on color Doppler imaging with normal direction of blood flow towards the liver. Other: There is a mild to moderate amount of perihepatic fluid. IMPRESSION: 1. Cholelithiasis and stable gallbladder wall thickening, which may be secondary to ascites. Sequelae associated with acute cholecystitis cannot be excluded. 2. Hepatic steatosis and hepatic cirrhosis with additional findings that may represent a hepatic cyst within the left lobe. 3. Ascites. Electronically Signed   By: Virgina Norfolk M.D.   On: 10/03/2022 00:08   CT CHEST ABDOMEN PELVIS W CONTRAST  Result Date: 10/02/2022 CLINICAL DATA:  Sepsis, abdominal pain out  of proportion, lactic acidosis. Bladder cancer. EXAM: CT CHEST, ABDOMEN, AND PELVIS WITH CONTRAST TECHNIQUE: Multidetector CT imaging of the chest, abdomen and pelvis was performed following the standard protocol during bolus administration of intravenous contrast. RADIATION DOSE REDUCTION: This exam was performed according to the departmental dose-optimization program which includes automated exposure control, adjustment of the mA and/or kV according to patient size and/or use of iterative reconstruction technique. CONTRAST:  137m OMNIPAQUE IOHEXOL 300 MG/ML  SOLN COMPARISON:  PET CT 08/26/2022, CT abdomen pelvis 04/11/2022 FINDINGS: CT CHEST FINDINGS Cardiovascular: Mild coronary artery calcification. Extensive calcification of the mitral valve annulus. Global cardiac size within normal limits. No pericardial effusion. Central pulmonary arteries are enlarged in keeping with changes of pulmonary arterial hypertension. Moderate atherosclerotic calcification within the thoracic aorta. No aortic aneurysm. Mediastinum/Nodes: Pathologic right paratracheal lymph node demonstrating hypermetabolism on prior PET CT examination is stable measuring 15 mm in short axis diameter at axial image # 17/2. No new pathologic thoracic adenopathy. Esophagus unremarkable. Visualized thyroid unremarkable. Lungs/Pleura: Interval development of small right pleural effusion. Interval development of trace interstitial pulmonary edema, possibly cardiogenic in nature. No pneumothorax. No central obstructing lesion. 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 Stable subacute fractures of the superior endplate of [id number] and the medial right eleventh and twelfth ribs proximal to the costotransverse junction demonstrating callus is again noted. No acute bone abnormality. No focal lytic lesion identified. CT ABDOMEN PELVIS FINDINGS Hepatobiliary: Cholelithiasis again noted. The  gallbladder is distended and a gallstone is seen impacted within the gallbladder neck, similar to prior examination. There has, however, developed increasing pericholecystic infiltration which may relate to inflammatory changes the gallbladder, as can be seen with calculus cholecystitis, or progressive ascites. Cirrhosis. No enhancing intrahepatic mass. No intra or extrahepatic biliary ductal dilation. Pancreas: Unremarkable Spleen: Unremarkable Adrenals/Urinary Tract: The adrenal glands are unremarkable. The kidneys are normal in size and position. Mild left hydronephrosis and hydroureter to the  level of the left ureterovesicular junction is stable since prior PET CT examination. No hydronephrosis on the right. No intrarenal or ureteral calculi. No enhancing intrarenal masses. The bladder is unremarkable. Stomach/Bowel: There is subtle asymmetric bowel wall thickening involving the hepatic flexure of the colon adjacent to the gallbladder suggesting an adjacent inflammatory process. The stomach, small bowel, and large bowel are otherwise unremarkable. Appendix normal. Mild ascites is present, new since prior PET CT examination. No free intraperitoneal gas. Vascular/Lymphatic: Extensive aortoiliac atherosclerotic calcification. No aortic aneurysm. Stable left periaortic adenopathy since prior PET CT examination where this demonstrate no significant metabolic activity and likely represents the residua of treated disease. No new pathologic adenopathy within the abdomen and pelvis. Reproductive: Multiple hypoenhancing masses are again seen within the uterus most in keeping with multiple uterine fibroids. The pelvic organs are otherwise unremarkable. Other: Increasing diffuse subcutaneous body wall edema in keeping with progressive anasarca. No abdominal wall hernia. Musculoskeletal: Degenerative changes are seen within the lumbar spine. No acute bone abnormality. No lytic bone lesion. IMPRESSION: 1. Interval development  of small right pleural effusion, trace interstitial pulmonary edema, mild ascites, and progressive diffuse subcutaneous body wall edema in keeping with progressive anasarca and/or cardiogenic failure. 2. Cholelithiasis with gallstone impacted within the gallbladder neck. Interval development of pericholecystic infiltration which may relate to inflammatory changes the gallbladder, as can be seen with calculus cholecystitis, or progressive ascites. Subtle inflammatory change involving the adjacent hepatic flexure of the colon, however, suggests acute cholecystitis. Correlation with liver enzymes and possible right upper quadrant sonography may be helpful for further evaluation. 3. Cirrhosis. 4. Stable mild left hydronephrosis and hydroureter to the level of the left ureterovesicular junction, possibly related to the patient's known underlying bladder malignancy. No mass lesion identified, however. 5. Stable subacute fractures of the superior endplate of [id number] anteriorly and the medial right eleventh and twelfth ribs proximal to the costotransverse junction. No acute bone abnormality identified. 6. Mild coronary artery calcification. 7.  Aortic Atherosclerosis (ICD10-I70.0). Electronically Signed   By: Fidela Salisbury M.D.   On: 10/02/2022 21:53   DG Chest Port 1 View  Result Date: 10/02/2022 CLINICAL DATA:  Sepsis EXAM: PORTABLE CHEST 1 VIEW COMPARISON:  04/11/2022 FINDINGS: Lungs are well expanded, symmetric, and clear. No pneumothorax or pleural effusion. Cardiac size within normal limits. Extensive mitral valve annular calcifications again noted. Pulmonary vascularity is normal. Osseous structures are age-appropriate. Advanced degenerative changes noted within the shoulders bilaterally. No acute bone abnormality. IMPRESSION: 1. No active disease. Electronically Signed   By: Fidela Salisbury M.D.   On: 10/02/2022 19:38    ASSESSMENT: Sepsis secondary to cholecystitis, stage IV bladder cancer.  PLAN:    Sepsis  secondary to cholecystitis: Patient continues to refuse any invasive procedures.  Both surgery and interventional radiology have recommended conservative management.  Appreciate ID input recommending 4 to 6 weeks antibiotics. Stage IV bladder cancer: In remission.  Patient continues with maintenance Keytruda.  She has been instructed to keep her previously scheduled follow-up appointment next week for continuation of treatment. Hyponatremia: Mild, monitor.  Patient sodium is 132 today. Hyperbilirubinemia: Improving. Anemia: Chronic and unchanged.  Patient's most recent hemoglobin is 9.7.  Will follow.  Lloyd Huger, MD   10/06/2022 6:17 PM

## 2022-10-06 NOTE — Consult Note (Signed)
ANTICOAGULATION CONSULT NOTE   Pharmacy Consult for Heparin Indication: atrial fibrillation  Allergies  Allergen Reactions   Atenolol Other (See Comments)    bradycardia bradycardia     Patient Measurements: Height: '4\' 11"'$  (149.9 cm) Weight: 54.5 kg (120 lb 2.4 oz) IBW/kg (Calculated) : 43.2 Heparin Dosing Weight: 54.5 kg  Vital Signs: Temp: 97.8 F (36.6 C) (02/28 0300) Temp Source: Oral (02/28 0300) BP: 130/69 (02/28 0500) Pulse Rate: 62 (02/28 0500)  Labs: Recent Labs    10/04/22 0449 10/05/22 0516 10/05/22 1047 10/05/22 1947 10/06/22 0454  HGB 10.2* 10.0*  --   --  9.7*  HCT 30.2* 30.6*  --   --  29.0*  PLT 175 197  --   --  196  APTT  --   --  40* 109* 136*  LABPROT 19.2* 18.5*  --   --   --   INR 1.6* 1.6*  --   --   --   HEPARINUNFRC  --   --   --   --  >1.10*  CREATININE 0.89 1.00  --   --  1.02*     Estimated Creatinine Clearance: 38.1 mL/min (A) (by C-G formula based on SCr of 1.02 mg/dL (H)).   Medical History: Past Medical History:  Diagnosis Date   Aortic atherosclerosis ([place])    Arthritis    Atrial fibrillation ([place])    a.) s/p TEE with cardioversion on 02/04/2020. b.) on daily apixaban.   Bladder mass    a.) CT 04/08/2021 --> 3.2 cm intraluminal bladder mass.   Carotid stenosis, bilateral    a.) Doppler 07/04/2020 --> mild; 1-49% stenosis BILATERALLY.   Cholelithiasis    a.) CT 11/06/2020 --> largest measured 4.5 cm   Chronic anticoagulation    a.) Apixaban   Common biliary duct calculus    a.) CT 04/08/2021 --> CBD dilated at 8 mm; 91m calculus within distal CBD.   Coronary artery disease involving native coronary artery of native heart without angina pectoris [id number]  Diastolic dysfunction    a.) TTE 07/04/2020 --> LVEF 60-65%; G1DD.   Hepatic cirrhosis (HCC)    Hepatic steatosis    Hepatitis 1970   History of 2019 novel coronavirus disease (COVID-19) 12/20/2019   History of marijuana use    Hypertension    Hypertensive  retinopathy    IDA (iron deficiency anemia)    Mitral stenosis    a.) TEE 04/10/2020 --> mild. b.) TTE 07/04/2020 --> EF 60-65%; mild (mean gradient 5 mmHg).   Moderate pulmonary arterial systolic hypertension [place] 12/22/2019   Nephrolithiasis    NSTEMI (non-ST elevated myocardial infarction) ([facility] 07/26/2016   Open-angle glaucoma 09/11/2010   PAH (pulmonary artery hypertension) ([facility]    a.) TTE 12/21/2019 --> PASP 44 mmHg.   Uterine fibroid    a.) CT 04/08/2021 --> multiple with largest measuring 7 cm.   Valvular regurgitation    a.) TTE 09/01/2010 --> trivial to mild pan-valvular. b.) TTE 12/21/2019 --> mild MR and AR; moderate TR. c.) TTE 07/04/2020 --> mild TR; trivial MR and PR.   Vestibular neuronitis     Medications:  PTA: Eliquis '5mg'$  BID (last dose 2/26 PM)  Inpatient; Heparin infusion (2/27 >>) Allergies: No AC/APT related allergies   Assessment: 72year old F with PMH of A-fib on Eliquis, cholelithiasis, bladder cancer, HTN, CAD and hepatitis presenting with RUQ pain, nausea and vomiting. Possibility of perc chole drain placement. Last dose apixaban 2/26 PM. Pharmacy will start heparin while we are holding  apixaban. CHADSVASc 4. Will need to use aPTT for monitoring. Will not order baseline HL.   Date Time aPTT/HL Rate/Comment 2/27 1947 109 / ---- SUPRAtherapeutic / 750 > 700 u/hr 2/28     0454    136/> 1.10     SUPRATherapeutic   Goal of Therapy:  Heparin level 0.3-0.7 units/ml aPTT 66-102 seconds Monitor platelets by anticoagulation protocol: Yes   Plan:  2/28 @ 0454:  aPTT = > 136,   HL = > 1.10 - Will hold heparin drip for 1 hr and restart @ 550 units/hr.  Will recheck aPTT 8 hrs after restart. - Will recheck HL on 2/29 with AM labs.  Titrate by aPTT's until lab correlation is noted, then titrate by anti-xa alone. Continue to monitor H&H and platelets daily while on heparin gtt.  Demitrious Mccannon D, PharmD 10/06/2022,6:35 AM

## 2022-10-06 NOTE — Assessment & Plan Note (Signed)
Albumin 1.5.

## 2022-10-06 NOTE — Assessment & Plan Note (Addendum)
Present on admission, with Streptococcus pneumoniae growing out of blood cultures.  Echocardiogram showed a possible vegetation on the mitral valve.  Seen by infectious disease and recommended 4 to 6 weeks of IV antibiotics (since she refused TEE).  Currently on Unasyn but likely will be switched over to Rocephin upon discharge. PICC line was placed and outpatient IV antibiotic management team will train her Sister [name] today.

## 2022-10-06 NOTE — Assessment & Plan Note (Signed)
Continue midodrine  

## 2022-10-06 NOTE — Assessment & Plan Note (Addendum)
Patient declined cholecystectomy tube placement.  Currently on Unasyn.  General surgery following.  Not a great surgical candidate.  Try to advance diet

## 2022-10-06 NOTE — Consult Note (Addendum)
ANTICOAGULATION CONSULT NOTE   Pharmacy Consult for Heparin Indication: atrial fibrillation  Allergies  Allergen Reactions   Atenolol Other (See Comments)    bradycardia bradycardia     Patient Measurements: Height: '4\' 11"'$  (149.9 cm) Weight: 54.5 kg (120 lb 2.4 oz) IBW/kg (Calculated) : 43.2 Heparin Dosing Weight: 54.5 kg  Vital Signs: Temp: 99 F (37.2 C) (02/28 1500) Temp Source: Oral (02/28 1500) BP: 155/130 (02/28 1500) Pulse Rate: 85 (02/28 1500)  Labs: Recent Labs    10/04/22 0449 10/05/22 0516 10/05/22 1047 10/05/22 1947 10/06/22 0454 10/06/22 1550  HGB 10.2* 10.0*  --   --  9.7*  --   HCT 30.2* 30.6*  --   --  29.0*  --   PLT 175 197  --   --  196  --   APTT  --   --    < > 109* 136* 65*  LABPROT 19.2* 18.5*  --   --  23.7*  --   INR 1.6* 1.6*  --   --  2.1*  --   HEPARINUNFRC  --   --   --   --  >1.10*  --   CREATININE 0.89 1.00  --   --  1.02*  --    < > = values in this interval not displayed.     Estimated Creatinine Clearance: 38.1 mL/min (A) (by C-G formula based on SCr of 1.02 mg/dL (H)).   Medical History: Past Medical History:  Diagnosis Date   Aortic atherosclerosis ([place])    Arthritis    Atrial fibrillation ([place])    a.) s/p TEE with cardioversion on 02/04/2020. b.) on daily apixaban.   Bladder mass    a.) CT 04/08/2021 --> 3.2 cm intraluminal bladder mass.   Carotid stenosis, bilateral    a.) Doppler 07/04/2020 --> mild; 1-49% stenosis BILATERALLY.   Cholelithiasis    a.) CT 11/06/2020 --> largest measured 4.5 cm   Chronic anticoagulation    a.) Apixaban   Common biliary duct calculus    a.) CT 04/08/2021 --> CBD dilated at 8 mm; 24m calculus within distal CBD.   Coronary artery disease involving native coronary artery of native heart without angina pectoris [id number]  Diastolic dysfunction    a.) TTE 07/04/2020 --> LVEF 60-65%; G1DD.   Hepatic cirrhosis (HCC)    Hepatic steatosis    Hepatitis 1970   History of 2019 novel  coronavirus disease (COVID-19) 12/20/2019   History of marijuana use    Hypertension    Hypertensive retinopathy    IDA (iron deficiency anemia)    Mitral stenosis    a.) TEE 04/10/2020 --> mild. b.) TTE 07/04/2020 --> EF 60-65%; mild (mean gradient 5 mmHg).   Moderate pulmonary arterial systolic hypertension ([facility] 12/22/2019   Nephrolithiasis    [place]I (non-ST elevated [place] infarction) [place] 07/26/2016   Open-angle glaucoma 09/11/2010   PA[place] ([place] artery hypertension) [place]    a.) TTE 12/21/2019 --> PASP 44 mmHg.   Uterine fibroid    a.) CT 04/08/2021 --> multiple with largest measuring 7 cm.   Valvular regurgitation    a.) TTE 09/01/2010 --> trivial to mild pan-valvular. b.) TTE 12/21/2019 --> mild MR and AR; moderate TR. c.) TTE 07/04/2020 --> mild TR; trivial MR and PR.   Vestibular neuronitis     Medications:  PTA: Eliquis '5mg'$  BID (last dose 2/26 PM)  Inpatient; Heparin infusion (2/27 >>) Allergies: No AC/APT related allergies   Assessment: 72year old F with PMH of A-fib on  Eliquis, cholelithiasis, bladder cancer, HTN, CAD and hepatitis presenting with RUQ pain, nausea and vomiting. Possibility of perc chole drain placement. Last dose apixaban 2/26 PM. Pharmacy will start heparin while we are holding apixaban. CHADSVASc 4. Will need to use aPTT for monitoring. Will not order baseline HL.   Date Time aPTT/HL Rate/Comment 2/27 1947 109 / ---- SUPRAtherapeutic / 750 > 700 u/hr 2/28     0454    136/> 1.10     SUPRATherapeutic   Goal of Therapy:  Heparin level 0.3-0.7 units/ml aPTT 66-102 seconds Monitor platelets by anticoagulation protocol: Yes   Plan: aPTT 65 - slightly subtherapeutic -Will increase heparin to @ 650 units/hr. No bolus.  Will recheck aPTT 8 hrs after rate change. - Will recheck HL on 2/29 with AM labs.  Titrate by aPTT's until lab correlation is noted, then titrate by anti-xa alone. Continue to monitor H&H and platelets daily while on heparin  gtt.  Wynelle Cleveland, PharmD, BCPS 10/06/2022,4:51 PM

## 2022-10-06 NOTE — Assessment & Plan Note (Addendum)
Paroxysmal in nature.  Currently rate controlled on metoprolol and Cardizem.  Since patient refusing procedure, we placed back on Eliquis.

## 2022-10-06 NOTE — Hospital Course (Addendum)
72 year old F with PMH of A-fib on Eliquis, cholelithiasis, bladder cancer, HTN, CAD and hepatitis presenting with RUQ pain, nausea and vomiting.  Workup including CT abdomen and pelvis and MRCP concerning for acute calculus cholecystitis, hepatic cirrhosis and heterogeneous hepatic steatosis.  General surgery consulted and recommended percutaneous drain but IR feels patient is a poor candidate for percutaneous cholecystostomy due to cirrhosis and ascites with increased risk of bleeding and bacterial peritonitis, and recommended conservative management.  Patient is not interested in surgical intervention either.  Patient is on IV Zosyn.  General surgery following.   Blood culture with Streptococcus pneumonia.  Echocardiogram concerning for mitral valve vegetation.  ID consulted and following.   Patient is also in A-fib with RVR.  Started on amiodarone drip.   2/28.  Patient declined cholecystectomy tube with the general surgery team.  Still having abdominal pain.  Taken off amiodarone drip on metoprolol and Cardizem. 2/29.  Patient again refused cholecystectomy tube.  Will change heparin drip over to Eliquis.  Try to advance diet to see how things go. 3/1.  Patient again refused cholecystectomy tube.  3/2: Vital stable.  Repeat blood cultures from 2/26 remain negative.  Continue to refuse any procedures.  PICC line ordered. 3/3: Remained stable.  Still awaiting PICC line placement and hopefully discharge tomorrow after arranging outpatient antibiotics. 3/4: Hemodynamically stable.  PT is recommending SNF, patient does not want to go to rehab.  PICC line was placed.  Outpatient IV antibiotic team will teach family member, her Sister [name] is going to help and will get the training.  Most likely can go home tomorrow morning.  Oncology is also trying to arrange outpatient follow-up appointment. 3/5: Patient remained stable.  ID placed orders for outpatient IV antibiotics with cefazolin 2 g every 8 hourly,  and patient will continue antibiotics until 11/11/2022 and will require weekly CBC with differential and CMP.  Initially ceftriaxone was considered but due to her history of gall stone and not having a perc cholecystostomy, the risk for further stones or sludge is increased with longer duration of ceftriaxone-antibiotics switched to cefazolin.  Few changes were made to her medications.  Patient was not taking a lot of them at baseline.  Amiodarone was discontinued and she will continue with metoprolol at a lower dose and Cardizem 60 mg twice daily.  She will continue with Eliquis.  Patient need to have a follow-up with her oncologist continuation of bladder cancer treatment.  Patient also need to follow-up with infectious disease for further recommendations.  She will continue on current medications and need to have a close follow-up with her providers for further recommendations.

## 2022-10-06 NOTE — Consult Note (Signed)
[place] at [facility] [phone]) [phone] [phone]   Name: Kristina Ellis Date: 10/06/2022 MRN: [id number]  DOB: July 12, 1951  Patient Care Team: [facility] [place] as PCP - General (General Practice) Lloyd Huger, MD as Consulting Physician (Hematology and Oncology)    REASON FOR CONSULTATION: Kristina Ellis is a 72 y.o. female with multiple medical problems including A-fib, cholelithiasis, diastolic CHF, and pulmonary hypertension. Patient has history of bladder cancer status post TURBT on 05/15/2021. She declined cystectomy and instead opted for chemotherapy and radiation.  Most recently, patient has been on treatment with Keytruda.  She is admitted to hospital on 10/02/2022 with acute cholecystitis.  General surgery felt patient was a poor surgical candidate and recommended percutaneous drain.  IR felt the patient was a poor candidate for percutaneous cholecystostomy due to cirrhosis and ascites.  She has been managed on IV antibiotics.  Palliative care was consulted to address goals.  SOCIAL HISTORY:     reports that she has quit smoking. Her smoking use included cigarettes. She has a 1.00 pack-year smoking history. She has been exposed to tobacco smoke. She has never used smokeless tobacco. She reports current alcohol use of about 5.0 standard drinks of alcohol per week. She reports that she does not currently use drugs after having used the following drugs: Marijuana.  Patient is unmarried.  She lives at home with her son.  Patient retired from a Writer.   ADVANCE DIRECTIVES:  Not on file  CODE STATUS: DNR  PAST MEDICAL HISTORY: Past Medical History:  Diagnosis Date   Aortic atherosclerosis ([place])    Arthritis    Atrial fibrillation ([place])    a.) s/p TEE with cardioversion on 02/04/2020. b.) on daily apixaban.   Bladder mass    a.) CT 04/08/2021 --> 3.2 cm intraluminal bladder mass.    Carotid stenosis, bilateral    a.) Doppler 07/04/2020 --> mild; 1-49% stenosis BILATERALLY.   Cholelithiasis    a.) CT 11/06/2020 --> largest measured 4.5 cm   Chronic anticoagulation    a.) Apixaban   Common biliary duct calculus    a.) CT 04/08/2021 --> CBD dilated at 8 mm; 66m calculus within distal CBD.   Coronary artery disease involving native coronary artery of native heart without angina pectoris [id number]  Diastolic dysfunction    a.) TTE 07/04/2020 --> LVEF 60-65%; G1DD.   Hepatic cirrhosis (HCC)    Hepatic steatosis    Hepatitis 1970   History of 2019 novel coronavirus disease (COVID-19) 12/20/2019   History of marijuana use    Hypertension    Hypertensive retinopathy    IDA (iron deficiency anemia)    Mitral stenosis    a.) TEE 04/10/2020 --> mild. b.) TTE 07/04/2020 --> EF 60-65%; mild (mean gradient 5 mmHg).   Moderate pulmonary arterial systolic hypertension ([facility] 12/22/2019   Nephrolithiasis    NSTEMI (non-ST elevated myocardial infarction) ([facility] 07/26/2016   Open-angle glaucoma 09/11/2010   PAH (pulmonary artery hypertension) ([place]    a.) TTE 12/21/2019 --> PASP 44 mmHg.   Uterine fibroid    a.) CT 04/08/2021 --> multiple with largest measuring 7 cm.   Valvular regurgitation    a.) TTE 09/01/2010 --> trivial to mild pan-valvular. b.) TTE 12/21/2019 --> mild MR and AR; moderate TR. c.) TTE 07/04/2020 --> mild TR; trivial MR and PR.   Vestibular neuronitis     PAST SURGICAL HISTORY:  Past Surgical History:  Procedure  Laterality Date   BREAST CYST ASPIRATION Left    COLONOSCOPY  2012   ERCP N/A 12/18/2021   Procedure: ENDOSCOPIC RETROGRADE CHOLANGIOPANCREATOGRAPHY (ERCP);  Surgeon: Lucilla Lame, MD;  Location: [facility] ENDOSCOPY;  Service: Endoscopy;  Laterality: N/A;   TEE WITH CARDIOVERSION N/A 02/04/2020   Procedure: TEE WITH CARDIOVERSION; Location: [facility]; Surgeon: Kandis Cocking, MD   TRANSURETHRAL RESECTION OF BLADDER TUMOR N/A 05/15/2021   Procedure: TRANSURETHRAL  RESECTION OF BLADDER TUMOR (TURBT);  Surgeon: Billey Co, MD;  Location: [facility] ORS;  Service: Urology;  Laterality: N/A;    HEMATOLOGY/ONCOLOGY HISTORY:  Oncology History  Bladder cancer ([place])  05/29/2021 Initial Diagnosis   Bladder cancer ([place])   06/04/2021 Cancer Staging   Staging form: Urinary Bladder, AJCC 8th Edition - Clinical stage from 06/04/2021: Stage II (cT2, cN0, cM0) - Signed by Lloyd Huger, MD on 06/04/2021 WHO/ISUP grade (low/high): High Grade Histologic grading system: 2 grade system   06/23/2021 - 08/04/2021 Chemotherapy   Patient is on Treatment Plan : BLADDER Cisplatin q7d + XRT     06/08/2022 -  Chemotherapy   Patient is on Treatment Plan : BLADDER Pembrolizumab (200) q21d       ALLERGIES:  is allergic to atenolol.  MEDICATIONS:  Current Facility-Administered Medications  Medication Dose Route Frequency Provider Last Rate Last Admin   Ampicillin-Sulbactam (UNASYN) 3 g in sodium chloride 0.9 % 100 mL IVPB  3 g Intravenous Q6H Ravishankar, Joellyn Quails, MD 200 mL/hr at 10/06/22 1142 3 g at 10/06/22 1142   diltiazem (CARDIZEM) tablet 60 mg  60 mg Oral Q12H Tang, Alanson Puls, PA-C       heparin ADULT infusion 100 units/mL (25000 units/229m)  550 Units/hr Intravenous Continuous GWendee BeaversT, MD 5.5 mL/hr at 10/06/22 0756 550 Units/hr at 10/06/22 0756   loperamide (IMODIUM) capsule 2 mg  2 mg Oral PRN GWendee BeaversT, MD   2 mg at 10/06/22 0313   metoprolol tartrate (LOPRESSOR) tablet 50 mg  50 mg Oral BID Tang, LAlanson Puls PA-C       midodrine (PROAMATINE) tablet 5 mg  5 mg Oral TID WC GWendee BeaversT, MD   5 mg at 10/06/22 1140   morphine (PF) 2 MG/ML injection 2 mg  2 mg Intravenous Q2H PRN GElwyn Reach MD   2 mg at 10/05/22 2315   ondansetron (ZOFRAN) tablet 4 mg  4 mg Oral Q6H PRN GElwyn Reach MD       Or   ondansetron (ZOFRAN) injection 4 mg  4 mg Intravenous Q6H PRN GElwyn Reach MD   4 mg at 10/04/22 1049   Oral care mouth  rinse  15 mL Mouth Rinse PRN GMercy Riding MD       Facility-Administered Medications Ordered in Other Encounters  Medication Dose Route Frequency Provider Last Rate Last Admin   heparin lock flush 100 unit/mL  500 Units Intracatheter Once PRN FLloyd Huger MD       sodium chloride flush (NS) 0.9 % injection 10 mL  10 mL Intracatheter PRN FLloyd Huger MD        VITAL SIGNS: BP 137/89   Pulse 75   Temp 98.2 F (36.8 C)   Resp 15   Ht '4\' 11"'$  (1.499 m)   Wt 120 lb 2.4 oz (54.5 kg)   SpO2 94%   BMI 24.27 kg/m  Filed Weights   10/02/22 1827  Weight: 120 lb 2.4 oz (54.5 kg)    Estimated body  mass index is 24.27 kg/m as calculated from the following:   Height as of this encounter: '4\' 11"'$  (1.499 m).   Weight as of this encounter: 120 lb 2.4 oz (54.5 kg).  LABS: CBC:    Component Value Date/Time   WBC 5.2 10/06/2022 0454   HGB 9.7 (L) 10/06/2022 0454   HCT 29.0 (L) 10/06/2022 0454   PLT 196 10/06/2022 0454   MCV 103.9 (H) 10/06/2022 0454   NEUTROABS 8.3 (H) 10/04/2022 0449   LYMPHSABS 0.3 (L) 10/04/2022 0449   MONOABS 0.4 10/04/2022 0449   EOSABS 0.0 10/04/2022 0449   BASOSABS 0.0 10/04/2022 0449   Comprehensive Metabolic Panel:    Component Value Date/Time   NA 132 (L) 10/06/2022 0454   K 3.7 10/06/2022 0454   CL 104 10/06/2022 0454   CO2 22 10/06/2022 0454   BUN 17 10/06/2022 0454   CREATININE 1.02 (H) 10/06/2022 0454   GLUCOSE 90 10/06/2022 0454   CALCIUM 7.6 (L) 10/06/2022 0454   AST 41 10/06/2022 0454   ALT 14 10/06/2022 0454   ALKPHOS 62 10/06/2022 0454   BILITOT 1.5 (H) 10/06/2022 0454   PROT 5.6 (L) 10/06/2022 0454   ALBUMIN 1.5 (L) 10/06/2022 0454    RADIOGRAPHIC STUDIES: ECHOCARDIOGRAM COMPLETE  Result Date: 10/04/2022    ECHOCARDIOGRAM REPORT   Patient Name:   ANNE KILMON Date of Exam: 10/04/2022 Medical Rec #:  [id number]        Height:       59.0 in Accession #:    [id number]       Weight:       120.1 lb Date of Birth:  03/25/1951          BSA:          1.485 m Patient Age:    89 years         BP:           131/120 mmHg Patient Gender: F                HR:           113 bpm. Exam Location:  [facility] Procedure: 2D Echo, Cardiac Doppler and Color Doppler Indications:     Bacteremia  History:         Patient has prior history of Echocardiogram examinations, most                  recent 08/10/2021. CAD and Previous Myocardial Infarction,                  Pulmonary HTN, Arrythmias:Atrial Fibrillation;                  Signs/Symptoms:Bacteremia and Chest Pain. Bladder CA.  Sonographer:     Wenda Low Referring Phys:  [id number] Charlesetta Ivory GONFA Diagnosing Phys: Kathlyn Sacramento MD IMPRESSIONS  1. Left ventricular ejection fraction, by estimation, is 45 to 50%. The left ventricle has mildly decreased function. Left ventricular endocardial border not optimally defined to evaluate regional wall motion. There is moderate left ventricular hypertrophy. Left ventricular diastolic parameters are indeterminate.  2. Right ventricular systolic function is normal. The right ventricular size is normal. There is moderately elevated pulmonary artery systolic pressure.  3. Left atrial size was severely dilated.  4. Small vegetation on the mitral valve.  5. The mitral valve is abnormal. Moderate mitral valve regurgitation. No evidence of mitral stenosis. Severe mitral annular calcification.  6. Tricuspid valve regurgitation is moderate.  7.  The aortic valve is normal in structure. Aortic valve regurgitation is trivial. Mild aortic valve stenosis. Aortic valve area, by VTI measures 1.43 cm. Aortic valve mean gradient measures 6.0 mmHg. Conclusion(s)/Recommendation(s): Findings concerning for mitral valve vegetation, would recommend a Transesophageal Echocardiogram for clarification. FINDINGS  Left Ventricle: Left ventricular ejection fraction, by estimation, is 45 to 50%. The left ventricle has mildly decreased function. Left ventricular endocardial border not optimally  defined to evaluate regional wall motion. The left ventricular internal cavity size was normal in size. There is moderate left ventricular hypertrophy. Left ventricular diastolic parameters are indeterminate. Right Ventricle: The right ventricular size is normal. No increase in right ventricular wall thickness. Right ventricular systolic function is normal. There is moderately elevated pulmonary artery systolic pressure. The tricuspid regurgitant velocity is 3.16 m/s, and with an assumed right atrial pressure of 8 mmHg, the estimated right ventricular systolic pressure is [id number] mmHg. Left Atrium: Left atrial size was severely dilated. Right Atrium: Right atrial size was normal in size. Pericardium: There is no evidence of pericardial effusion. Mitral Valve: The mitral valve is abnormal. There is moderate thickening of the mitral valve leaflet(s). There is moderate calcification of the mitral valve leaflet(s). Severe mitral annular calcification. Moderate mitral valve regurgitation. No evidence  of mitral valve stenosis. MV peak gradient, 16.2 mmHg. The mean mitral valve gradient is 7.0 mmHg. Tricuspid Valve: The tricuspid valve is normal in structure. Tricuspid valve regurgitation is moderate . No evidence of tricuspid stenosis. Aortic Valve: The aortic valve is normal in structure. Aortic valve regurgitation is trivial. Mild aortic stenosis is present. Aortic valve mean gradient measures 6.0 mmHg. Aortic valve peak gradient measures 12.8 mmHg. Aortic valve area, by VTI measures  1.43 cm. Pulmonic Valve: The pulmonic valve was normal in structure. Pulmonic valve regurgitation is trivial. No evidence of pulmonic stenosis. Aorta: The aortic root is normal in size and structure. Venous: The inferior vena cava was not well visualized. IAS/Shunts: No atrial level shunt detected by color flow Doppler.  LEFT VENTRICLE PLAX 2D LVIDd:         3.50 cm LVIDs:         2.70 cm LV PW:         1.20 cm LV IVS:        1.30 cm LVOT  diam:     1.90 cm LV SV:         39 LV SV Index:   27 LVOT Area:     2.84 cm  RIGHT VENTRICLE RV Basal diam:  3.20 cm RV Mid diam:    2.40 cm RV S prime:     13.50 cm/s LEFT ATRIUM             Index        RIGHT ATRIUM           Index LA diam:        5.00 cm 3.37 cm/m   RA Area:     14.50 cm LA Vol (A2C):   69.7 ml 46.93 ml/m  RA Volume:   37.90 ml  25.52 ml/m LA Vol (A4C):   86.6 ml 58.31 ml/m LA Biplane Vol: 79.4 ml 53.46 ml/m  AORTIC VALVE                     PULMONIC VALVE AV Area (Vmax):    1.71 cm      PV Vmax:       1.01 m/s AV Area (Vmean):  1.53 cm      PV Peak grad:  4.1 mmHg AV Area (VTI):     1.43 cm AV Vmax:           179.00 cm/s AV Vmean:          112.000 cm/s AV VTI:            0.275 m AV Peak Grad:      12.8 mmHg AV Mean Grad:      6.0 mmHg LVOT Vmax:         108.00 cm/s LVOT Vmean:        60.300 cm/s LVOT VTI:          0.139 m LVOT/AV VTI ratio: 0.51  AORTA Ao Root diam: 3.20 cm Ao Asc diam:  2.70 cm MITRAL VALVE                TRICUSPID VALVE MV Area (PHT): 2.91 cm     TR Peak grad:   39.9 mmHg MV Area VTI:   1.68 cm     TR Vmax:        316.00 cm/s MV Peak grad:  16.2 mmHg MV Mean grad:  7.0 mmHg     SHUNTS MV Vmax:       2.01 m/s     Systemic VTI:  0.14 m MV Vmean:      116.0 cm/s   Systemic Diam: 1.90 cm MV Decel Time: 261 msec MV E velocity: 159.00 cm/s Kathlyn Sacramento MD Electronically signed by Kathlyn Sacramento MD Signature Date/Time: 10/04/2022/5:20:56 PM    Final    DG Chest Port 1 View  Result Date: 10/04/2022 CLINICAL DATA:  Shortness of breath. EXAM: PORTABLE CHEST 1 VIEW COMPARISON:  Chest radiograph and CT 10/02/2022 FINDINGS: The patient is rotated to the right. The cardiac silhouette is borderline enlarged. There is extensive mitral annular calcification. Lung volumes are low with mild interstitial densities in the lung bases. No overt pulmonary edema, lobar consolidation, sizeable pleural effusion, or pneumothorax is identified. Advanced bilateral glenohumeral  arthropathy is again noted. IMPRESSION: Low lung volumes with mild bibasilar opacities which could reflect atelectasis or early infection. Electronically Signed   By: Logan Bores M.D.   On: 10/04/2022 15:15   MR ABDOMEN MRCP W WO CONTAST  Result Date: 10/03/2022 CLINICAL DATA:  72 year old female with suspected biliary obstruction. EXAM: MRI ABDOMEN WITHOUT AND WITH CONTRAST (INCLUDING MRCP) TECHNIQUE: Multiplanar multisequence MR imaging of the abdomen was performed both before and after the administration of intravenous contrast. Heavily T2-weighted images of the biliary and pancreatic ducts were obtained, and three-dimensional MRCP images were rendered by post processing. CONTRAST:  21m GADAVIST GADOBUTROL 1 MMOL/ML IV SOLN COMPARISON:  Abdominal MRI 04/11/2022. Right upper quadrant abdominal ultrasound 10/03/2022. CT of the abdomen and pelvis 10/02/2022. FINDINGS: Lower chest: Small right pleural effusion lying dependently. Hepatobiliary: Diffuse but heterogeneous loss of signal intensity throughout the hepatic parenchyma on out of phase dual echo images, indicative of a background of heterogeneous hepatic steatosis. Liver has a shrunken appearance and very nodular contour, indicative of underlying cirrhosis. No definite suspicious cystic or solid hepatic lesions are confidently identified. No intra or extrahepatic biliary ductal dilatation noted on MRCP images. Common bile duct measures 6 mm in the porta hepatis. No filling defect within the common bile duct to suggest choledocholithiasis. However, there is heterogeneous signal intensity within the lumen of the gallbladder, most notable for a large signal void which measures up to 4.5 x 3.0 cm, compatible  with a large stone. Gallbladder wall is irregularly thickened and edematous measuring up to 1 cm, with surrounding pericholecystic fluid and inflammatory changes. Pancreas: No pancreatic mass. No pancreatic ductal dilatation. No pancreatic or  peripancreatic fluid collections or inflammatory changes. Spleen:  Unremarkable. Adrenals/Urinary Tract: Bilateral kidneys and bilateral adrenal glands are normal in appearance. No hydroureteronephrosis in the visualized portions of the abdomen. Stomach/Bowel: Visualized portions are unremarkable. Vascular/Lymphatic: No aneurysm identified in the visualized abdominal vasculature. No lymphadenopathy noted in the abdomen. Other: Trace volume of ascites. Inflammatory changes centered around the gallbladder in the right upper quadrant of the abdomen. Incidental imaging of the pelvis demonstrates an enlarged uterus with multiple lesions, presumably fibroids. Musculoskeletal: No aggressive appearing osseous lesions are noted in the visualized portions of the skeleton. IMPRESSION: 1. Cholelithiasis with imaging findings highly concerning for acute cholecystitis. Surgical consultation is recommended. 2. No choledocholithiasis or findings of biliary tract obstruction. 3. Hepatic cirrhosis and heterogeneous hepatic steatosis. No aggressive appearing hepatic lesion noted at this time. 4. Trace volume of ascites. 5. Small right pleural effusion lying dependently. Electronically Signed   By: Vinnie Langton M.D.   On: 10/03/2022 05:55   MR 3D Recon At Scanner  Result Date: 10/03/2022 CLINICAL DATA:  72 year old female with suspected biliary obstruction. EXAM: MRI ABDOMEN WITHOUT AND WITH CONTRAST (INCLUDING MRCP) TECHNIQUE: Multiplanar multisequence MR imaging of the abdomen was performed both before and after the administration of intravenous contrast. Heavily T2-weighted images of the biliary and pancreatic ducts were obtained, and three-dimensional MRCP images were rendered by post processing. CONTRAST:  107m GADAVIST GADOBUTROL 1 MMOL/ML IV SOLN COMPARISON:  Abdominal MRI 04/11/2022. Right upper quadrant abdominal ultrasound 10/03/2022. CT of the abdomen and pelvis 10/02/2022. FINDINGS: Lower chest: Small right pleural  effusion lying dependently. Hepatobiliary: Diffuse but heterogeneous loss of signal intensity throughout the hepatic parenchyma on out of phase dual echo images, indicative of a background of heterogeneous hepatic steatosis. Liver has a shrunken appearance and very nodular contour, indicative of underlying cirrhosis. No definite suspicious cystic or solid hepatic lesions are confidently identified. No intra or extrahepatic biliary ductal dilatation noted on MRCP images. Common bile duct measures 6 mm in the porta hepatis. No filling defect within the common bile duct to suggest choledocholithiasis. However, there is heterogeneous signal intensity within the lumen of the gallbladder, most notable for a large signal void which measures up to 4.5 x 3.0 cm, compatible with a large stone. Gallbladder wall is irregularly thickened and edematous measuring up to 1 cm, with surrounding pericholecystic fluid and inflammatory changes. Pancreas: No pancreatic mass. No pancreatic ductal dilatation. No pancreatic or peripancreatic fluid collections or inflammatory changes. Spleen:  Unremarkable. Adrenals/Urinary Tract: Bilateral kidneys and bilateral adrenal glands are normal in appearance. No hydroureteronephrosis in the visualized portions of the abdomen. Stomach/Bowel: Visualized portions are unremarkable. Vascular/Lymphatic: No aneurysm identified in the visualized abdominal vasculature. No lymphadenopathy noted in the abdomen. Other: Trace volume of ascites. Inflammatory changes centered around the gallbladder in the right upper quadrant of the abdomen. Incidental imaging of the pelvis demonstrates an enlarged uterus with multiple lesions, presumably fibroids. Musculoskeletal: No aggressive appearing osseous lesions are noted in the visualized portions of the skeleton. IMPRESSION: 1. Cholelithiasis with imaging findings highly concerning for acute cholecystitis. Surgical consultation is recommended. 2. No choledocholithiasis  or findings of biliary tract obstruction. 3. Hepatic cirrhosis and heterogeneous hepatic steatosis. No aggressive appearing hepatic lesion noted at this time. 4. Trace volume of ascites. 5. Small right pleural effusion lying dependently.  Electronically Signed   By: Vinnie Langton M.D.   On: 10/03/2022 05:55   US ABDOMEN LIMITED RUQ (LIVER/GB)  Result Date: 10/03/2022 CLINICAL DATA:  Abdominal pain. EXAM: ULTRASOUND ABDOMEN LIMITED RIGHT UPPER QUADRANT COMPARISON:  April 11, 2022 FINDINGS: Gallbladder: Large echogenic gallstones are seen within the lumen of a distended gallbladder (the largest measures approximately 3.0 cm). This is seen on the prior study. Stable gallbladder wall thickening (5.4 mm) and pericholecystic fluid are also noted. The presence or absence of a sonographic Percell Miller sign was not provided by the sonographer. Common bile duct: Diameter: 10.1 mm (measured 9.2 mm on the prior study) Liver: A 1.0 cm x 1.1 cm x 1.0 cm hypoechoic area is seen within the left lobe of the liver. No flow is noted within this region on color Doppler evaluation. The liver parenchyma is nodular in contour and diffusely increased in echogenicity. Portal vein is patent on color Doppler imaging with normal direction of blood flow towards the liver. Other: There is a mild to moderate amount of perihepatic fluid. IMPRESSION: 1. Cholelithiasis and stable gallbladder wall thickening, which may be secondary to ascites. Sequelae associated with acute cholecystitis cannot be excluded. 2. Hepatic steatosis and hepatic cirrhosis with additional findings that may represent a hepatic cyst within the left lobe. 3. Ascites. Electronically Signed   By: Virgina Norfolk M.D.   On: 10/03/2022 00:08   CT CHEST ABDOMEN PELVIS W CONTRAST  Result Date: 10/02/2022 CLINICAL DATA:  Sepsis, abdominal pain out of proportion, lactic acidosis. Bladder cancer. EXAM: CT CHEST, ABDOMEN, AND PELVIS WITH CONTRAST TECHNIQUE: Multidetector CT  imaging of the chest, abdomen and pelvis was performed following the standard protocol during bolus administration of intravenous contrast. RADIATION DOSE REDUCTION: This exam was performed according to the departmental dose-optimization program which includes automated exposure control, adjustment of the mA and/or kV according to patient size and/or use of iterative reconstruction technique. CONTRAST:  147m OMNIPAQUE IOHEXOL 300 MG/ML  SOLN COMPARISON:  PET CT 08/26/2022, CT abdomen pelvis 04/11/2022 FINDINGS: CT CHEST FINDINGS Cardiovascular: Mild coronary artery calcification. Extensive calcification of the mitral valve annulus. Global cardiac size within normal limits. No pericardial effusion. Central pulmonary arteries are enlarged in keeping with changes of pulmonary arterial hypertension. Moderate atherosclerotic calcification within the thoracic aorta. No aortic aneurysm. Mediastinum/Nodes: Pathologic right paratracheal lymph node demonstrating hypermetabolism on prior PET CT examination is stable measuring 15 mm in short axis diameter at axial image # 17/2. No new pathologic thoracic adenopathy. Esophagus unremarkable. Visualized thyroid unremarkable. Lungs/Pleura: Interval development of small right pleural effusion. Interval development of trace interstitial pulmonary edema, possibly cardiogenic in nature. No pneumothorax. No central obstructing lesion. 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 Stable subacute fractures of the superior endplate of [id number] and the medial right eleventh and twelfth ribs proximal to the costotransverse junction demonstrating callus is again noted. No acute bone abnormality. No focal lytic lesion identified. CT ABDOMEN PELVIS FINDINGS Hepatobiliary: Cholelithiasis again noted. The gallbladder is distended and a gallstone is seen impacted within the gallbladder neck, similar to prior examination. There  has, however, developed increasing pericholecystic infiltration which may relate to inflammatory changes the gallbladder, as can be seen with calculus cholecystitis, or progressive ascites. Cirrhosis. No enhancing intrahepatic mass. No intra or extrahepatic biliary ductal dilation. Pancreas: Unremarkable Spleen: Unremarkable Adrenals/Urinary Tract: The adrenal glands are unremarkable. The kidneys are normal in size and position. Mild left hydronephrosis and hydroureter to the level of the left ureterovesicular  junction is stable since prior PET CT examination. No hydronephrosis on the right. No intrarenal or ureteral calculi. No enhancing intrarenal masses. The bladder is unremarkable. Stomach/Bowel: There is subtle asymmetric bowel wall thickening involving the hepatic flexure of the colon adjacent to the gallbladder suggesting an adjacent inflammatory process. The stomach, small bowel, and large bowel are otherwise unremarkable. Appendix normal. Mild ascites is present, new since prior PET CT examination. No free intraperitoneal gas. Vascular/Lymphatic: Extensive aortoiliac atherosclerotic calcification. No aortic aneurysm. Stable left periaortic adenopathy since prior PET CT examination where this demonstrate no significant metabolic activity and likely represents the residua of treated disease. No new pathologic adenopathy within the abdomen and pelvis. Reproductive: Multiple hypoenhancing masses are again seen within the uterus most in keeping with multiple uterine fibroids. The pelvic organs are otherwise unremarkable. Other: Increasing diffuse subcutaneous body wall edema in keeping with progressive anasarca. No abdominal wall hernia. Musculoskeletal: Degenerative changes are seen within the lumbar spine. No acute bone abnormality. No lytic bone lesion. IMPRESSION: 1. Interval development of small right pleural effusion, trace interstitial pulmonary edema, mild ascites, and progressive diffuse subcutaneous  body wall edema in keeping with progressive anasarca and/or cardiogenic failure. 2. Cholelithiasis with gallstone impacted within the gallbladder neck. Interval development of pericholecystic infiltration which may relate to inflammatory changes the gallbladder, as can be seen with calculus cholecystitis, or progressive ascites. Subtle inflammatory change involving the adjacent hepatic flexure of the colon, however, suggests acute cholecystitis. Correlation with liver enzymes and possible right upper quadrant sonography may be helpful for further evaluation. 3. Cirrhosis. 4. Stable mild left hydronephrosis and hydroureter to the level of the left ureterovesicular junction, possibly related to the patient's known underlying bladder malignancy. No mass lesion identified, however. 5. Stable subacute fractures of the superior endplate of [id number] anteriorly and the medial right eleventh and twelfth ribs proximal to the costotransverse junction. No acute bone abnormality identified. 6. Mild coronary artery calcification. 7.  Aortic Atherosclerosis (ICD10-I70.0). Electronically Signed   By: Fidela Salisbury M.D.   On: 10/02/2022 21:53   DG Chest Port 1 View  Result Date: 10/02/2022 CLINICAL DATA:  Sepsis EXAM: PORTABLE CHEST 1 VIEW COMPARISON:  04/11/2022 FINDINGS: Lungs are well expanded, symmetric, and clear. No pneumothorax or pleural effusion. Cardiac size within normal limits. Extensive mitral valve annular calcifications again noted. Pulmonary vascularity is normal. Osseous structures are age-appropriate. Advanced degenerative changes noted within the shoulders bilaterally. No acute bone abnormality. IMPRESSION: 1. No active disease. Electronically Signed   By: Fidela Salisbury M.D.   On: 10/02/2022 19:38    PERFORMANCE STATUS (ECOG) : 2 - Symptomatic, <50% confined to bed  Review of Systems Unless otherwise noted, a complete review of systems is negative.  Physical Exam General: NAD Pulmonary:  Unlabored Extremities: no edema, no joint deformities Skin: no rashes Neurological: Weakness but otherwise nonfocal  IMPRESSION: Patient is known to me from the clinic.  General surgery feels patient is likely a poor surgical candidate.  They recommended cholecystotomy tube but patient has declined this.  She is currently managed on antibiotics.  Discussed overall goals with patient.  She states clearly and repeatedly that she is not interested in any interventional management.  She says she recognizes that there is possibility of decline, sepsis, and death.  She says "why is everyone so afraid of death."  She does confirm agreement with the current scope of treatment even if IV antibiotics are required for prolonged period of time.  She recognizes that her cancer treatments  would need to be delayed in light of active infection.  Patient will also be seen by Dr. Grayland Ormond.  PLAN: -Continue current scope of treatment -DNR/DNI  Time Total: 30 minutes  Visit consisted of counseling and education dealing with the complex and emotionally intense issues of symptom management and palliative care in the setting of serious and potentially life-threatening illness.Greater than 50%  of this time was spent counseling and coordinating care related to the above assessment and plan.  Signed by: Altha Harm, PhD, NP-C

## 2022-10-06 NOTE — Assessment & Plan Note (Addendum)
Sodium 134

## 2022-10-06 NOTE — Assessment & Plan Note (Signed)
EF 45% with moderate mitral valve regurgitation

## 2022-10-06 NOTE — Progress Notes (Signed)
[place] NOTE       Patient ID: Kristina Ellis MRN: [id number] DOB/AGE: 72-Nov-1952 72 y.o.  Admit date: 10/02/2022 Referring Physician Dr. Wendee Beavers  Primary Physician [facility] Primary Cardiologist Dr. Clayborn Bigness Reason for Consultation AF RVR, MV veg  HPI: Kristina Ellis is a 72yoF with a PMH of paroxysmal AF (apixaban, amiodarone) s/p TEE/DCCV 01/2020 [facility]), mod MR, PR, mod-sev TR, mild AR, liver cirrhosis, stage II bladder cancer s/p TURBT, XRT, and chemo, who presented to [facility] ED 10/02/22 with RUQ abdominal pain, nausea and vomiting. Febrile on admission, and CT abd/pelvis & MRCP c/f acute cholecystitis, pending possible IR cholecystostomy tube. Blood cx positive for strep pneumo bacteremia. Cardiology is consulted on hospital day 2 for assistance with her AF RVR & mitral valve veg on TTE.   Interval History:  - continues to refuse surgical or IR interventions - feels "upset." Relays frustrations with being in the hospital. No palpitations, heart racing, or shortness of breath.  - discussed rationale for TEE, she also refuses this.  - in AF, rate controlled in the 60s-70s  Review of systems complete and found to be negative unless listed above     Past Medical History:  Diagnosis Date   Aortic atherosclerosis (HCC)    Arthritis    Atrial fibrillation ([place])    a.) s/p TEE with cardioversion on 02/04/2020. b.) on daily apixaban.   Bladder mass    a.) CT 04/08/2021 --> 3.2 cm intraluminal bladder mass.   Carotid stenosis, bilateral    a.) Doppler 07/04/2020 --> mild; 1-49% stenosis BILATERALLY.   Cholelithiasis    a.) CT 11/06/2020 --> largest measured 4.5 cm   Chronic anticoagulation    a.) Apixaban   Common biliary duct calculus    a.) CT 04/08/2021 --> CBD dilated at 8 mm; 69m calculus within distal CBD.   Coronary artery disease involving native coronary artery of native heart without angina pectoris [id number]   Diastolic dysfunction    a.) TTE 07/04/2020 --> LVEF 60-65%; G1DD.   Hepatic cirrhosis (HCC)    Hepatic steatosis    Hepatitis 1970   History of 2019 novel coronavirus disease (COVID-19) 12/20/2019   History of marijuana use    Hypertension    Hypertensive retinopathy    IDA (iron deficiency anemia)    Mitral stenosis    a.) TEE 04/10/2020 --> mild. b.) TTE 07/04/2020 --> EF 60-65%; mild (mean gradient 5 mmHg).   Moderate pulmonary arterial systolic hypertension ([place] 12/22/2019   Nephrolithiasis    [place]STEMI (non-ST elevated myocardial infarction) [place] 07/26/2016   Open-angle glaucoma 09/11/2010   PAH (pulmonary artery hypertension) ([place]    a.) TTE 12/21/2019 --> PASP 44 mmHg.   Uterine fibroid    a.) CT 04/08/2021 --> multiple with largest measuring 7 cm.   Valvular regurgitation    a.) TTE 09/01/2010 --> trivial to mild pan-valvular. b.) TTE 12/21/2019 --> mild MR and AR; moderate TR. c.) TTE 07/04/2020 --> mild TR; trivial MR and PR.   Vestibular neuronitis     Past Surgical History:  Procedure Laterality Date   BREAST CYST ASPIRATION Left    COLONOSCOPY  2012   ERCP N/A 12/18/2021   Procedure: ENDOSCOPIC RETROGRADE CHOLANGIOPANCREATOGRAPHY (ERCP);  Surgeon: WLucilla Lame MD;  [facility];  Service: Endoscopy;  Laterality: N/A;   TEE WITH CARDIOVERSION N/A 02/04/2020   Procedure: TEE WITH CARDIOVERSION; Location: [facility]; Surgeon: CKandis Cocking MD   TRANSURETHRAL RESECTION OF BLADDER TUMOR  N/A 05/15/2021   Procedure: TRANSURETHRAL RESECTION OF BLADDER TUMOR (TURBT);  Surgeon: Billey Co, MD;  Location: [facility] ORS;  Service: Urology;  Laterality: N/A;    Medications Prior to Admission  Medication Sig Dispense Refill Last Dose   amiodarone (PACERONE) 200 MG tablet Take by mouth.   10/02/2022   magnesium oxide (MAG-OX) 400 MG tablet Take 1 tablet by mouth 2 (two) times daily.   Past Month   metoprolol tartrate (LOPRESSOR) 100 MG tablet Take 1 tablet by mouth 2 (two) times  daily.   10/02/2022   potassium chloride SA (KLOR-CON M) 20 MEQ tablet Take 1 tablet (20 mEq total) by mouth 2 (two) times daily. 90 tablet 1 [id number]   trolamine salicylate (ASPERCREME) 10 % cream Apply 1 application topically as needed for muscle pain.   Past Month   atorvastatin (LIPITOR) 40 MG tablet Take 40 mg by mouth at bedtime. (Patient not taking: Reported on 08/31/2022)   Not Taking   diltiazem (CARDIZEM CD) 120 MG 24 hr capsule Take 1 capsule (120 mg total) by mouth daily. (Patient not taking: Reported on 08/31/2022) 30 capsule 0 Not Taking   ELIQUIS 5 MG TABS tablet Take 1 tablet (5 mg total) by mouth 2 (two) times daily. 60 tablet 1    metoprolol succinate (TOPROL-XL) 25 MG 24 hr tablet Take 1 tablet (25 mg total) by mouth 2 (two) times daily. Take with or immediately following a meal. 60 tablet 0    Multiple Vitamin (MULTIVITAMIN WITH MINERALS) TABS tablet Take 1 tablet by mouth daily. (Patient not taking: Reported on 08/31/2022) 90 tablet 0 Not Taking   oxybutynin (DITROPAN-XL) 10 MG 24 hr tablet Take 1 tablet (10 mg total) by mouth daily. (Patient not taking: Reported on 08/31/2022) 30 tablet 11 Not Taking   Social History   Socioeconomic History   Marital status: Single    Spouse name: Not on file   Number of children: 1   Years of education: Not on file   Highest education level: Not on file  Occupational History   Not on file  Tobacco Use   Smoking status: Former    Packs/day: 0.10    Years: 10.00    Total pack years: 1.00    Types: Cigarettes    Passive exposure: Past   Smokeless tobacco: Never   Tobacco comments:    occasional smoker  Vaping Use   Vaping Use: Never used  Substance and Sexual Activity   Alcohol use: Yes    Alcohol/week: 5.0 standard drinks of alcohol    Types: 5 Cans of beer per week    Comment: weekly   Drug use: Not Currently    Types: 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, 70's   Sexual activity: Not on file  Other Topics Concern   Not on  file  Social History Narrative   Lives with son   Social Determinants of Health   Financial Resource Strain: Low Risk  (06/18/2022)   Overall Financial Resource Strain (CARDIA)    Difficulty of Paying Living Expenses: Not very hard  Food Insecurity: No Food Insecurity (10/03/2022)   Hunger Vital Sign    Worried About Running Out of Food in the Last Year: Never true    Ran Out of Food in the Last Year: Never true  Transportation Needs: No Transportation Needs (10/03/2022)   PRAPARE - Hydrologist (Medical): No    Lack of Transportation (Non-Medical): No  Physical  Activity: Inactive (06/18/2022)   Exercise Vital Sign    Days of Exercise per Week: 0 days    Minutes of Exercise per Session: 0 min  Stress: No Stress Concern Present (06/18/2022)   [place]    Feeling of Stress : Only a little  Social Connections: Socially Isolated (06/18/2022)   Social Connection and Isolation Panel [NHANES]    Frequency of Communication with Friends and Family: More than three times a week    Frequency of Social Gatherings with Friends and Family: More than three times a week    Attends Religious Services: Never    Marine scientist or Organizations: No    Attends Archivist Meetings: Never    Marital Status: Never married  Intimate Partner Violence: Not At Risk (10/03/2022)   Humiliation, Afraid, Rape, and Kick questionnaire    Fear of Current or Ex-Partner: No    Emotionally Abused: No    Physically Abused: No    Sexually Abused: No    Family History  Problem Relation Age of Onset   Aneurysm Mother    Colon cancer Father    Lung cancer Brother    Breast cancer Neg Hx       Intake/Output Summary (Last 24 hours) at 10/06/2022 1044 Last data filed at 10/06/2022 1000 Gross per 24 hour  Intake 1656.74 ml  Output 200 ml  Net 1456.74 ml     Vitals:   10/06/22 0200 10/06/22 0300  10/06/22 0400 10/06/22 0500  BP: 122/82 123/72 125/70 130/69  Pulse: 72 66 66 62  Resp: (!) 23 19 (!) 22 17  Temp:  97.8 F (36.6 C)    TempSrc:  Oral    SpO2: 94% 97% 100% 99%  Weight:      Height:        PHYSICAL EXAM General: elderly thin black female, in no acute distress. Laying flat in bed. HEENT:  Normocephalic and atraumatic. Neck:  No JVD.  Lungs: Normal respiratory effort on room air. Clear bilaterally to auscultation. No wheezes, crackles, rhonchi. Heart: irregularly irregular with controlled rate . Normal S1 and S2 without gallops or murmurs.  Abdomen: Non-distended appearing.  Msk: Normal strength and tone for age. Extremities: Warm and well perfused. No clubbing, cyanosis.  No peripheral edema.  Neuro: Alert and oriented X 3. Psych:  Answers questions appropriately.   Labs: Basic Metabolic Panel: Recent Labs    10/05/22 0516 10/06/22 0454  NA 131* 132*  K 3.8 3.7  CL 105 104  CO2 19* 22  GLUCOSE 90 90  BUN 17 17  CREATININE 1.00 1.02*  CALCIUM 7.5* 7.6*  MG 2.0 1.8  PHOS 2.7 2.6    Liver Function Tests: Recent Labs    10/05/22 0516 10/06/22 0454  AST 37 41  ALT 14 14  ALKPHOS 48 62  BILITOT 1.5* 1.5*  PROT 5.7* 5.6*  ALBUMIN 1.7* 1.5*    No results for input(s): "LIPASE", "AMYLASE" in the last 72 hours.  CBC: Recent Labs    10/04/22 0449 10/05/22 0516 10/06/22 0454  WBC 9.1 5.4 5.2  NEUTROABS 8.3*  --   --   HGB 10.2* 10.0* 9.7*  HCT 30.2* 30.6* 29.0*  MCV 104.9* 106.3* 103.9*  PLT 175 197 196    Cardiac Enzymes: No results for input(s): "CKTOTAL", "CKMB", "CKMBINDEX", "TROPONINIHS" in the last 72 hours.  BNP: Recent Labs    10/04/22 1503  BNP 285.4*  D-Dimer: No results for input(s): "DDIMER" in the last 72 hours. Hemoglobin A1C: No results for input(s): "HGBA1C" in the last 72 hours. Fasting Lipid Panel: No results for input(s): "CHOL", "HDL", "LDLCALC", "TRIG", "CHOLHDL", "LDLDIRECT" in the last 72  hours. Thyroid Function Tests: No results for input(s): "TSH", "T4TOTAL", "T3FREE", "THYROIDAB" in the last 72 hours.  Invalid input(s): "FREET3"  Anemia Panel: No results for input(s): "VITAMINB12", "FOLATE", "FERRITIN", "TIBC", "IRON", "RETICCTPCT" in the last 72 hours.   Radiology: ECHOCARDIOGRAM COMPLETE  Result Date: 10/04/2022    ECHOCARDIOGRAM REPORT   Patient Name:   MAKHIA MOROS Date of Exam: 10/04/2022 Medical Rec #:  [id number]        Height:       59.0 in Accession #:    [id number]       Weight:       120.1 lb Date of Birth:  03/02/1951         BSA:          1.485 m Patient Age:    41 years         BP:           131/120 mmHg Patient Gender: F                HR:           113 bpm. Exam Location:  [facility] Procedure: 2D Echo, Cardiac Doppler and Color Doppler Indications:     Bacteremia  History:         Patient has prior history of Echocardiogram examinations, most                  recent 08/10/2021. CAD and Previous Myocardial Infarction,                  Pulmonary HTN, Arrythmias:Atrial Fibrillation;                  Signs/Symptoms:Bacteremia and Chest Pain. Bladder CA.  Sonographer:     Wenda Low Referring Phys:  [id number] Charlesetta Ivory GONFA Diagnosing Phys: Kathlyn Sacramento MD IMPRESSIONS  1. Left ventricular ejection fraction, by estimation, is 45 to 50%. The left ventricle has mildly decreased function. Left ventricular endocardial border not optimally defined to evaluate regional wall motion. There is moderate left ventricular hypertrophy. Left ventricular diastolic parameters are indeterminate.  2. Right ventricular systolic function is normal. The right ventricular size is normal. There is moderately elevated pulmonary artery systolic pressure.  3. Left atrial size was severely dilated.  4. Small vegetation on the mitral valve.  5. The mitral valve is abnormal. Moderate mitral valve regurgitation. No evidence of mitral stenosis. Severe mitral annular calcification.  6. Tricuspid valve  regurgitation is moderate.  7. The aortic valve is normal in structure. Aortic valve regurgitation is trivial. Mild aortic valve stenosis. Aortic valve area, by VTI measures 1.43 cm. Aortic valve mean gradient measures 6.0 mmHg. Conclusion(s)/Recommendation(s): Findings concerning for mitral valve vegetation, would recommend a Transesophageal Echocardiogram for clarification. FINDINGS  Left Ventricle: Left ventricular ejection fraction, by estimation, is 45 to 50%. The left ventricle has mildly decreased function. Left ventricular endocardial border not optimally defined to evaluate regional wall motion. The left ventricular internal cavity size was normal in size. There is moderate left ventricular hypertrophy. Left ventricular diastolic parameters are indeterminate. Right Ventricle: The right ventricular size is normal. No increase in right ventricular wall thickness. Right ventricular systolic function is normal. There is moderately elevated pulmonary artery systolic pressure. The tricuspid  regurgitant velocity is 3.16 m/s, and with an assumed right atrial pressure of 8 mmHg, the estimated right ventricular systolic pressure is [id number] mmHg. Left Atrium: Left atrial size was severely dilated. Right Atrium: Right atrial size was normal in size. Pericardium: There is no evidence of pericardial effusion. Mitral Valve: The mitral valve is abnormal. There is moderate thickening of the mitral valve leaflet(s). There is moderate calcification of the mitral valve leaflet(s). Severe mitral annular calcification. Moderate mitral valve regurgitation. No evidence  of mitral valve stenosis. MV peak gradient, 16.2 mmHg. The mean mitral valve gradient is 7.0 mmHg. Tricuspid Valve: The tricuspid valve is normal in structure. Tricuspid valve regurgitation is moderate . No evidence of tricuspid stenosis. Aortic Valve: The aortic valve is normal in structure. Aortic valve regurgitation is trivial. Mild aortic stenosis is present.  Aortic valve mean gradient measures 6.0 mmHg. Aortic valve peak gradient measures 12.8 mmHg. Aortic valve area, by VTI measures  1.43 cm. Pulmonic Valve: The pulmonic valve was normal in structure. Pulmonic valve regurgitation is trivial. No evidence of pulmonic stenosis. Aorta: The aortic root is normal in size and structure. Venous: The inferior vena cava was not well visualized. IAS/Shunts: No atrial level shunt detected by color flow Doppler.  LEFT VENTRICLE PLAX 2D LVIDd:         3.50 cm LVIDs:         2.70 cm LV PW:         1.20 cm LV IVS:        1.30 cm LVOT diam:     1.90 cm LV SV:         39 LV SV Index:   27 LVOT Area:     2.84 cm  RIGHT VENTRICLE RV Basal diam:  3.20 cm RV Mid diam:    2.40 cm RV S prime:     13.50 cm/s LEFT ATRIUM             Index        RIGHT ATRIUM           Index LA diam:        5.00 cm 3.37 cm/m   RA Area:     14.50 cm LA Vol (A2C):   69.7 ml 46.93 ml/m  RA Volume:   37.90 ml  25.52 ml/m LA Vol (A4C):   86.6 ml 58.31 ml/m LA Biplane Vol: 79.4 ml 53.46 ml/m  AORTIC VALVE                     PULMONIC VALVE AV Area (Vmax):    1.71 cm      PV Vmax:       1.01 m/s AV Area (Vmean):   1.53 cm      PV Peak grad:  4.1 mmHg AV Area (VTI):     1.43 cm AV Vmax:           179.00 cm/s AV Vmean:          112.000 cm/s AV VTI:            0.275 m AV Peak Grad:      12.8 mmHg AV Mean Grad:      6.0 mmHg LVOT Vmax:         108.00 cm/s LVOT Vmean:        60.300 cm/s LVOT VTI:          0.139 m LVOT/AV VTI ratio: 0.51  AORTA Ao Root diam: 3.20 cm Ao  Asc diam:  2.70 cm MITRAL VALVE                TRICUSPID VALVE MV Area (PHT): 2.91 cm     TR Peak grad:   39.9 mmHg MV Area VTI:   1.68 cm     TR Vmax:        316.00 cm/s MV Peak grad:  16.2 mmHg MV Mean grad:  7.0 mmHg     SHUNTS MV Vmax:       2.01 m/s     Systemic VTI:  0.14 m MV Vmean:      116.0 cm/s   Systemic Diam: 1.90 cm MV Decel Time: 261 msec MV E velocity: 159.00 cm/s Kathlyn Sacramento MD Electronically signed by Kathlyn Sacramento MD  Signature Date/Time: 10/04/2022/5:20:56 PM    Final    DG Chest Port 1 View  Result Date: 10/04/2022 CLINICAL DATA:  Shortness of breath. EXAM: PORTABLE CHEST 1 VIEW COMPARISON:  Chest radiograph and CT 10/02/2022 FINDINGS: The patient is rotated to the right. The cardiac silhouette is borderline enlarged. There is extensive mitral annular calcification. Lung volumes are low with mild interstitial densities in the lung bases. No overt pulmonary edema, lobar consolidation, sizeable pleural effusion, or pneumothorax is identified. Advanced bilateral glenohumeral arthropathy is again noted. IMPRESSION: Low lung volumes with mild bibasilar opacities which could reflect atelectasis or early infection. Electronically Signed   By: Logan Bores M.D.   On: 10/04/2022 15:15   MR ABDOMEN MRCP W WO CONTAST  Result Date: 10/03/2022 CLINICAL DATA:  72 year old female with suspected biliary obstruction. EXAM: MRI ABDOMEN WITHOUT AND WITH CONTRAST (INCLUDING MRCP) TECHNIQUE: Multiplanar multisequence MR imaging of the abdomen was performed both before and after the administration of intravenous contrast. Heavily T2-weighted images of the biliary and pancreatic ducts were obtained, and three-dimensional MRCP images were rendered by post processing. CONTRAST:  34m GADAVIST GADOBUTROL 1 MMOL/ML IV SOLN COMPARISON:  Abdominal MRI 04/11/2022. Right upper quadrant abdominal ultrasound 10/03/2022. CT of the abdomen and pelvis 10/02/2022. FINDINGS: Lower chest: Small right pleural effusion lying dependently. Hepatobiliary: Diffuse but heterogeneous loss of signal intensity throughout the hepatic parenchyma on out of phase dual echo images, indicative of a background of heterogeneous hepatic steatosis. Liver has a shrunken appearance and very nodular contour, indicative of underlying cirrhosis. No definite suspicious cystic or solid hepatic lesions are confidently identified. No intra or extrahepatic biliary ductal dilatation noted  on MRCP images. Common bile duct measures 6 mm in the porta hepatis. No filling defect within the common bile duct to suggest choledocholithiasis. However, there is heterogeneous signal intensity within the lumen of the gallbladder, most notable for a large signal void which measures up to 4.5 x 3.0 cm, compatible with a large stone. Gallbladder wall is irregularly thickened and edematous measuring up to 1 cm, with surrounding pericholecystic fluid and inflammatory changes. Pancreas: No pancreatic mass. No pancreatic ductal dilatation. No pancreatic or peripancreatic fluid collections or inflammatory changes. Spleen:  Unremarkable. Adrenals/Urinary Tract: Bilateral kidneys and bilateral adrenal glands are normal in appearance. No hydroureteronephrosis in the visualized portions of the abdomen. Stomach/Bowel: Visualized portions are unremarkable. Vascular/Lymphatic: No aneurysm identified in the visualized abdominal vasculature. No lymphadenopathy noted in the abdomen. Other: Trace volume of ascites. Inflammatory changes centered around the gallbladder in the right upper quadrant of the abdomen. Incidental imaging of the pelvis demonstrates an enlarged uterus with multiple lesions, presumably fibroids. Musculoskeletal: No aggressive appearing osseous lesions are noted in the visualized portions of the  skeleton. IMPRESSION: 1. Cholelithiasis with imaging findings highly concerning for acute cholecystitis. Surgical consultation is recommended. 2. No choledocholithiasis or findings of biliary tract obstruction. 3. Hepatic cirrhosis and heterogeneous hepatic steatosis. No aggressive appearing hepatic lesion noted at this time. 4. Trace volume of ascites. 5. Small right pleural effusion lying dependently. Electronically Signed   By: Vinnie Langton M.D.   On: 10/03/2022 05:55   MR 3D Recon At Scanner  Result Date: 10/03/2022 CLINICAL DATA:  72 year old female with suspected biliary obstruction. EXAM: MRI ABDOMEN  WITHOUT AND WITH CONTRAST (INCLUDING MRCP) TECHNIQUE: Multiplanar multisequence MR imaging of the abdomen was performed both before and after the administration of intravenous contrast. Heavily T2-weighted images of the biliary and pancreatic ducts were obtained, and three-dimensional MRCP images were rendered by post processing. CONTRAST:  22m GADAVIST GADOBUTROL 1 MMOL/ML IV SOLN COMPARISON:  Abdominal MRI 04/11/2022. Right upper quadrant abdominal ultrasound 10/03/2022. CT of the abdomen and pelvis 10/02/2022. FINDINGS: Lower chest: Small right pleural effusion lying dependently. Hepatobiliary: Diffuse but heterogeneous loss of signal intensity throughout the hepatic parenchyma on out of phase dual echo images, indicative of a background of heterogeneous hepatic steatosis. Liver has a shrunken appearance and very nodular contour, indicative of underlying cirrhosis. No definite suspicious cystic or solid hepatic lesions are confidently identified. No intra or extrahepatic biliary ductal dilatation noted on MRCP images. Common bile duct measures 6 mm in the porta hepatis. No filling defect within the common bile duct to suggest choledocholithiasis. However, there is heterogeneous signal intensity within the lumen of the gallbladder, most notable for a large signal void which measures up to 4.5 x 3.0 cm, compatible with a large stone. Gallbladder wall is irregularly thickened and edematous measuring up to 1 cm, with surrounding pericholecystic fluid and inflammatory changes. Pancreas: No pancreatic mass. No pancreatic ductal dilatation. No pancreatic or peripancreatic fluid collections or inflammatory changes. Spleen:  Unremarkable. Adrenals/Urinary Tract: Bilateral kidneys and bilateral adrenal glands are normal in appearance. No hydroureteronephrosis in the visualized portions of the abdomen. Stomach/Bowel: Visualized portions are unremarkable. Vascular/Lymphatic: No aneurysm identified in the visualized abdominal  vasculature. No lymphadenopathy noted in the abdomen. Other: Trace volume of ascites. Inflammatory changes centered around the gallbladder in the right upper quadrant of the abdomen. Incidental imaging of the pelvis demonstrates an enlarged uterus with multiple lesions, presumably fibroids. Musculoskeletal: No aggressive appearing osseous lesions are noted in the visualized portions of the skeleton. IMPRESSION: 1. Cholelithiasis with imaging findings highly concerning for acute cholecystitis. Surgical consultation is recommended. 2. No choledocholithiasis or findings of biliary tract obstruction. 3. Hepatic cirrhosis and heterogeneous hepatic steatosis. No aggressive appearing hepatic lesion noted at this time. 4. Trace volume of ascites. 5. Small right pleural effusion lying dependently. Electronically Signed   By: DVinnie LangtonM.D.   On: 10/03/2022 05:55   [place]ABDOMEN LIMITED RUQ (LIVER/GB)  Result Date: 10/03/2022 CLINICAL DATA:  Abdominal pain. EXAM: ULTRASOUND ABDOMEN LIMITED RIGHT UPPER QUADRANT COMPARISON:  April 11, 2022 FINDINGS: Gallbladder: Large echogenic gallstones are seen within the lumen of a distended gallbladder (the largest measures approximately 3.0 cm). This is seen on the prior study. Stable gallbladder wall thickening (5.4 mm) and pericholecystic fluid are also noted. The presence or absence of a sonographic MPercell Millersign was not provided by the sonographer. Common bile duct: Diameter: 10.1 mm (measured 9.2 mm on the prior study) Liver: A 1.0 cm x 1.1 cm x 1.0 cm hypoechoic area is seen within the left lobe of the liver. No flow  is noted within this region on color Doppler evaluation. The liver parenchyma is nodular in contour and diffusely increased in echogenicity. Portal vein is patent on color Doppler imaging with normal direction of blood flow towards the liver. Other: There is a mild to moderate amount of perihepatic fluid. IMPRESSION: 1. Cholelithiasis and stable gallbladder  wall thickening, which may be secondary to ascites. Sequelae associated with acute cholecystitis cannot be excluded. 2. Hepatic steatosis and hepatic cirrhosis with additional findings that may represent a hepatic cyst within the left lobe. 3. Ascites. Electronically Signed   By: Virgina Norfolk M.D.   On: 10/03/2022 00:08   CT CHEST ABDOMEN PELVIS W CONTRAST  Result Date: 10/02/2022 CLINICAL DATA:  Sepsis, abdominal pain out of proportion, lactic acidosis. Bladder cancer. EXAM: CT CHEST, ABDOMEN, AND PELVIS WITH CONTRAST TECHNIQUE: Multidetector CT imaging of the chest, abdomen and pelvis was performed following the standard protocol during bolus administration of intravenous contrast. RADIATION DOSE REDUCTION: This exam was performed according to the departmental dose-optimization program which includes automated exposure control, adjustment of the mA and/or kV according to patient size and/or use of iterative reconstruction technique. CONTRAST:  150m OMNIPAQUE IOHEXOL 300 MG/ML  SOLN COMPARISON:  PET CT 08/26/2022, CT abdomen pelvis 04/11/2022 FINDINGS: CT CHEST FINDINGS Cardiovascular: Mild coronary artery calcification. Extensive calcification of the mitral valve annulus. Global cardiac size within normal limits. No pericardial effusion. Central pulmonary arteries are enlarged in keeping with changes of pulmonary arterial hypertension. Moderate atherosclerotic calcification within the thoracic aorta. No aortic aneurysm. Mediastinum/Nodes: Pathologic right paratracheal lymph node demonstrating hypermetabolism on prior PET CT examination is stable measuring 15 mm in short axis diameter at axial image # 17/2. No new pathologic thoracic adenopathy. Esophagus unremarkable. Visualized thyroid unremarkable. Lungs/Pleura: Interval development of small right pleural effusion. Interval development of trace interstitial pulmonary edema, possibly cardiogenic in nature. No pneumothorax. No central obstructing lesion.  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 Stable subacute fractures of the superior endplate of [id number] and the medial right eleventh and twelfth ribs proximal to the costotransverse junction demonstrating callus is again noted. No acute bone abnormality. No focal lytic lesion identified. CT ABDOMEN PELVIS FINDINGS Hepatobiliary: Cholelithiasis again noted. The gallbladder is distended and a gallstone is seen impacted within the gallbladder neck, similar to prior examination. There has, however, developed increasing pericholecystic infiltration which may relate to inflammatory changes the gallbladder, as can be seen with calculus cholecystitis, or progressive ascites. Cirrhosis. No enhancing intrahepatic mass. No intra or extrahepatic biliary ductal dilation. Pancreas: Unremarkable Spleen: Unremarkable Adrenals/Urinary Tract: The adrenal glands are unremarkable. The kidneys are normal in size and position. Mild left hydronephrosis and hydroureter to the level of the left ureterovesicular junction is stable since prior PET CT examination. No hydronephrosis on the right. No intrarenal or ureteral calculi. No enhancing intrarenal masses. The bladder is unremarkable. Stomach/Bowel: There is subtle asymmetric bowel wall thickening involving the hepatic flexure of the colon adjacent to the gallbladder suggesting an adjacent inflammatory process. The stomach, small bowel, and large bowel are otherwise unremarkable. Appendix normal. Mild ascites is present, new since prior PET CT examination. No free intraperitoneal gas. Vascular/Lymphatic: Extensive aortoiliac atherosclerotic calcification. No aortic aneurysm. Stable left periaortic adenopathy since prior PET CT examination where this demonstrate no significant metabolic activity and likely represents the residua of treated disease. No new pathologic adenopathy within the abdomen and pelvis.  Reproductive: Multiple hypoenhancing masses are again seen within the uterus most in keeping with  multiple uterine fibroids. The pelvic organs are otherwise unremarkable. Other: Increasing diffuse subcutaneous body wall edema in keeping with progressive anasarca. No abdominal wall hernia. Musculoskeletal: Degenerative changes are seen within the lumbar spine. No acute bone abnormality. No lytic bone lesion. IMPRESSION: 1. Interval development of small right pleural effusion, trace interstitial pulmonary edema, mild ascites, and progressive diffuse subcutaneous body wall edema in keeping with progressive anasarca and/or cardiogenic failure. 2. Cholelithiasis with gallstone impacted within the gallbladder neck. Interval development of pericholecystic infiltration which may relate to inflammatory changes the gallbladder, as can be seen with calculus cholecystitis, or progressive ascites. Subtle inflammatory change involving the adjacent hepatic flexure of the colon, however, suggests acute cholecystitis. Correlation with liver enzymes and possible right upper quadrant sonography may be helpful for further evaluation. 3. Cirrhosis. 4. Stable mild left hydronephrosis and hydroureter to the level of the left ureterovesicular junction, possibly related to the patient's known underlying bladder malignancy. No mass lesion identified, however. 5. Stable subacute fractures of the superior endplate of 624THL anteriorly and the medial right eleventh and twelfth ribs proximal to the costotransverse junction. No acute bone abnormality identified. 6. Mild coronary artery calcification. 7.  Aortic Atherosclerosis (ICD10-I70.0). Electronically Signed   By: Fidela Salisbury M.D.   On: 10/02/2022 21:53   DG Chest Port 1 View  Result Date: 10/02/2022 CLINICAL DATA:  Sepsis EXAM: PORTABLE CHEST 1 VIEW COMPARISON:  04/11/2022 FINDINGS: Lungs are well expanded, symmetric, and clear. No pneumothorax or pleural effusion. Cardiac size within  normal limits. Extensive mitral valve annular calcifications again noted. Pulmonary vascularity is normal. Osseous structures are age-appropriate. Advanced degenerative changes noted within the shoulders bilaterally. No acute bone abnormality. IMPRESSION: 1. No active disease. Electronically Signed   By: Fidela Salisbury M.D.   On: 10/02/2022 19:38    02/25/22 echo NORMAL LEFT VENTRICULAR SYSTOLIC FUNCTION   WITH MILD LVH  NORMAL RIGHT VENTRICULAR SYSTOLIC FUNCTION  NO VALVULAR STENOSIS  MODERATE MR, PR  MODERATE to SEVERE TR  MILD AR  EF 50-55%   02/22/2022 lexiscan myoview   Moderately abnormal myocardial perfusion scan there is  evidence of inferior borderline defect overall left ventricular function  is moderate to severely depressed 25 to 30% no clear evidence of  reversible ischemia this is a intermediate scan   TELEMETRY reviewed by me (LT) 10/06/2022 : AF RVR 100s-130s  EKG reviewed by me: AF LBBB 151  Data reviewed by me (LT) 10/06/2022: ed note, admission H&P, surgery and hospitalist progress note, last 24h vitals tele labs imaging I/O    Principal Problem:   Cholecystitis Active Problems:   Bladder cancer ([place])   Elevated troponin level not due myocardial infarction   Coronary artery disease involving native coronary artery of native heart without angina pectoris   Atrial fibrillation with rapid ventricular response (HCC)   Severe sepsis ([place])   Bacteremia due to Streptococcus pneumoniae   Goals of care, counseling/discussion   DNR (do not resuscitate)   Heart failure with mildly reduced ejection fraction ([place])   Mitral valve vegetation    ASSESSMENT AND PLAN:  Kharmen Buschmann is a 47yoF with a PMH of paroxysmal AF (apixaban, amiodarone) s/p TEE/DCCV 01/2020 [facility]), mod MR, PR, mod-sev TR, mild AR, liver cirrhosis, stage II bladder cancer s/p TURBT, XRT, and chemo, who presented to [facility] ED 10/02/22 with RUQ abdominal pain, nausea and vomiting. Febrile on admission, and CT  abd/pelvis & MRCP c/f acute cholecystitis, pending possible IR cholecystostomy tube. Blood cx positive for strep pneumo  bacteremia. Cardiology is consulted on hospital day 2 for assistance with her AF RVR & mitral valve veg on TTE.   # acute cholecystitis Presented with severe RUQ pain, nausea, and vomiting. MRCP with concern for acute cholecystitis for which percutaneous cholecystostomy drain was recommended, although the patient has been refusing this.  Surgery following.  Remains on IV antibiotics  # strep pneumo bacteremia # MV veg on TTE On IV Zosyn.  Small vegetation seen on surface echo for which the interpreting physician recommended a TEE for further clarification the patient reports difficulty swallowing over the past several months and a sensation of food getting stuck in her throat.  She is hesitant to undergo TEE because of this, may not be a good candidate if she does have dysphagia/?stricture.  -continues to refuse TEE today, ID following. Planning for extended Abx treatment.   # paroxysmal AF RVR  In the setting of acute cholecystitis, fever, and severe pain. Rate better controlled today in the 100s-120s.  -stop IV amiodarone, hx cirrhosis  -consolidate metoprolol tartrate to '50mg'$  BID -consolidate cardizem PO '60mg'$  BID -continue heparin in the event patient needs / is agreeable for surgical/IR intervention. Last dose of eliquis was 2/26 PM. Restart eliquis at discharge.  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  EF this admission with slight reduction compared to prior from 02/2022 with EF of 50-55%. GDMT with metoprolol, limited by relative hypotension. Consider ARB, MRA. Defer SGLT2i with hx bladder ca.   This patient's plan of care was discussed and created with Dr. Saralyn Pilar and he is in agreement.  Signed: Tristan Schroeder , PA-C 10/06/2022, 10:44 AM [facility]

## 2022-10-06 NOTE — Assessment & Plan Note (Signed)
Will check iron studies tomorrow.  Hemoglobin 9.7 today.

## 2022-10-06 NOTE — Assessment & Plan Note (Signed)
Seen on imaging.

## 2022-10-06 NOTE — Progress Notes (Signed)
[facility] SURGICAL PROGRESS NOTE (cpt [id number])  Hospital Day(s): 4.   Interval History: Patient seen and examined. No acute events overnight. Still in Afib this morning but much better rate control.  Patient reports she continues to have abdominal pain; seems worse in lower abdomen this morning but she insists "this is better." No fever, chills, nausea, emesis. She is without leukocytosis; WBC 5.2K. Hgb stable at 9.7. Renal function stable; sCr - 1.02; UO - 150 ccs + unmeasured. No electrolyte derangements. Mild hyperbilirubinemia; stable at 1.5. INR 2.1. She is on Zosyn. She is on CLD  Again discussed role for percutaneous cholecystostomy tube. She talked with her sister who she states was "adamantly against it." Patient also continues to refuse procedures.   Review of Systems:  Constitutional: denies fever, chills  HEENT: denies cough or congestion  Respiratory: denies any shortness of breath  Cardiovascular: denies chest pain; + palpitations  Gastrointestinal: + abdominal pain (unchanged), denied N/V Genitourinary: denies burning with urination or urinary frequency Musculoskeletal: denies pain, decreased motor or sensation  Vital signs in last 24 hours: [min-max] current  Temp:  [97.8 F (36.6 C)-98.1 F (36.7 C)] 97.8 F (36.6 C) (02/28 0300) Pulse Rate:  [61-147] 62 (02/28 0500) Resp:  [11-29] 17 (02/28 0500) BP: (114-149)/(67-126) 130/69 (02/28 0500) SpO2:  [94 %-100 %] 99 % (02/28 0500)     Height: '4\' 11"'$  (149.9 cm) Weight: 54.5 kg BMI (Calculated): 24.25   Intake/Output last 2 shifts:  02/27 0701 - 02/28 0700 In: 1581.1 [P.O.:720; I.V.:562.9; IV Piggyback:298.3] Out: 150 [Urine:150]   Physical Exam:  Constitutional: alert, cooperative and no distress  HENT: normocephalic without obvious abnormality  Eyes: PERRL, EOM's grossly intact and symmetric  Respiratory: breathing non-labored at rest  Cardiovascular: regular rate; irregularly  irregular Gastrointestinal: Soft, abdomen remains tender but seems worse across lower abdomen, non-distended Musculoskeletal: no edema or wounds, motor and sensation grossly intact, NT    Labs:     Latest Ref Rng & Units 10/06/2022    4:54 AM 10/05/2022    5:16 AM 10/04/2022    4:49 AM  CBC  WBC 4.0 - 10.5 K/uL 5.2  5.4  9.1   Hemoglobin 12.0 - 15.0 g/dL 9.7  10.0  10.2   Hematocrit 36.0 - 46.0 % 29.0  30.6  30.2   Platelets 150 - 400 K/uL 196  197  175       Latest Ref Rng & Units 10/06/2022    4:54 AM 10/05/2022    5:16 AM 10/04/2022    4:49 AM  CMP  Glucose 70 - 99 mg/dL 90  90  97   BUN 8 - 23 mg/dL '17  17  12   '$ Creatinine 0.44 - 1.00 mg/dL 1.02  1.00  0.89   Sodium 135 - 145 mmol/L 132  131  135   Potassium 3.5 - 5.1 mmol/L 3.7  3.8  3.8   Chloride 98 - 111 mmol/L 104  105  108   CO2 22 - 32 mmol/L '22  19  19   '$ Calcium 8.9 - 10.3 mg/dL 7.6  7.5  7.3   Total Protein 6.5 - 8.1 g/dL 5.6  5.7  5.6   Total Bilirubin 0.3 - 1.2 mg/dL 1.5  1.5  1.5   Alkaline Phos 38 - 126 U/L 62  48  39   AST 15 - 41 U/L 41  37  27   ALT 0 - 44 U/L 14  14  10  Imaging studies: No new pertinent imaging studies   Assessment/Plan: (ICD-10's: K81.0) 72 y.o. female with acute cholecystitis in setting of cirrhosis Ardine Eng B) and metastatic bladder cancer   - We again discussed role for cholecystostomy tube placement in this setting to treat cholecystitis. She did talk to her sister apparently and notes "she was adamantly against it." I directly asked the patient what she wanted to do and she continues to REFUSE any procedures including cholecystostomy. Given patient wishes, we will continue with Abx management alone for now. She does not want cholecystectomy and she is certainly a sub-optimal candidate given her comorbidities as mentioned. She is Ardine Eng B (30% peri-operative mortality).    - Would not advance past CLD right now given her continued abdominal pain.  - If she is agreeable with IR  Cholecystostomy tube would need to be NPO  - Continue IV Abx (Zosyn) - Monitor abdominal examination - Pain control prn; antiemetics prn  - Mobilize as feasible - Appreciate oncology input  - Further management per primary service; we will follow  -- Unfortunately not much to add from the surgical perspective given patient's refusal of treatments regarding the gallbladder   All of the above findings and recommendations were discussed with the patient, and the medical team, and all of patient's questions were answered to her expressed satisfaction.  -- Edison Simon, PA-C [facility] 10/06/2022, 10:03 AM M-F: 7am - 4pm

## 2022-10-07 ENCOUNTER — Encounter: Payer: Self-pay | Admitting: Oncology

## 2022-10-07 ENCOUNTER — Other Ambulatory Visit (HOSPITAL_COMMUNITY): Payer: Self-pay

## 2022-10-07 DIAGNOSIS — R7881 Bacteremia: Secondary | ICD-10-CM | POA: Diagnosis not present

## 2022-10-07 DIAGNOSIS — I9589 Other hypotension: Secondary | ICD-10-CM | POA: Diagnosis not present

## 2022-10-07 DIAGNOSIS — D509 Iron deficiency anemia, unspecified: Secondary | ICD-10-CM | POA: Insufficient documentation

## 2022-10-07 DIAGNOSIS — C679 Malignant neoplasm of bladder, unspecified: Secondary | ICD-10-CM | POA: Diagnosis not present

## 2022-10-07 DIAGNOSIS — A419 Sepsis, unspecified organism: Secondary | ICD-10-CM | POA: Diagnosis not present

## 2022-10-07 DIAGNOSIS — K819 Cholecystitis, unspecified: Secondary | ICD-10-CM | POA: Diagnosis not present

## 2022-10-07 DIAGNOSIS — K810 Acute cholecystitis: Secondary | ICD-10-CM | POA: Diagnosis not present

## 2022-10-07 DIAGNOSIS — I4891 Unspecified atrial fibrillation: Secondary | ICD-10-CM | POA: Diagnosis not present

## 2022-10-07 DIAGNOSIS — D508 Other iron deficiency anemias: Secondary | ICD-10-CM

## 2022-10-07 LAB — CULTURE, BLOOD (ROUTINE X 2): Culture: NO GROWTH

## 2022-10-07 LAB — CBC
HCT: 33.1 % — ABNORMAL LOW (ref 36.0–46.0)
Hemoglobin: 10.9 g/dL — ABNORMAL LOW (ref 12.0–15.0)
MCH: 34.2 pg — ABNORMAL HIGH (ref 26.0–34.0)
MCHC: 32.9 g/dL (ref 30.0–36.0)
MCV: 103.8 fL — ABNORMAL HIGH (ref 80.0–100.0)
Platelets: 197 10*3/uL (ref 150–400)
RBC: 3.19 MIL/uL — ABNORMAL LOW (ref 3.87–5.11)
RDW: 14.5 % (ref 11.5–15.5)
WBC: 5 10*3/uL (ref 4.0–10.5)
nRBC: 0 % (ref 0.0–0.2)

## 2022-10-07 LAB — HEPARIN LEVEL (UNFRACTIONATED): Heparin Unfractionated: 0.87 IU/mL — ABNORMAL HIGH (ref 0.30–0.70)

## 2022-10-07 LAB — APTT
aPTT: 104 seconds — ABNORMAL HIGH (ref 24–36)
aPTT: 68 seconds — ABNORMAL HIGH (ref 24–36)

## 2022-10-07 LAB — PROTIME-INR
INR: 1.5 — ABNORMAL HIGH (ref 0.8–1.2)
Prothrombin Time: 17.8 seconds — ABNORMAL HIGH (ref 11.4–15.2)

## 2022-10-07 LAB — FERRITIN: Ferritin: 92 ng/mL (ref 11–307)

## 2022-10-07 MED ORDER — APIXABAN 5 MG PO TABS
5.0000 mg | ORAL_TABLET | Freq: Two times a day (BID) | ORAL | Status: DC
  Administered 2022-10-07 – 2022-10-12 (×11): 5 mg via ORAL
  Filled 2022-10-07 (×11): qty 1

## 2022-10-07 NOTE — Progress Notes (Signed)
   Date of Admission:  10/02/2022      ID: Kristina Ellis is a 72 y.o. femalePrincipal Problem:   Severe sepsis ([place]) Active Problems:   Bladder cancer ([place])   Hypotension   Elevated troponin level not due myocardial infarction   Coronary artery disease involving native coronary artery of native heart without angina pectoris   Atrial fibrillation with rapid ventricular response (HCC)   Bacteremia due to Streptococcus pneumoniae   Goals of care, counseling/discussion   DNR (do not resuscitate)   Heart failure with mildly reduced ejection fraction ([place])   Mitral valve vegetation   Acute cholecystitis   Liver cirrhosis (HCC)   Hyponatremia   Hypoalbuminemia   Palliative care encounter   Iron deficiency anemia    Subjective: Pt has abdominal pain She tried to get up and sit on the [facility] and had severe pain  Medications:   apixaban  5 mg Oral BID   diltiazem  60 mg Oral Q12H   metoprolol tartrate  50 mg Oral BID   midodrine  5 mg Oral TID WC    Objective: Vital signs in last 24 hours: Temp:  [97.8 F (36.6 C)-98.1 F (36.7 C)] 98.1 F (36.7 C) (02/29 1200) Pulse Rate:  [61-97] 85 (02/29 1200) Resp:  [11-35] 17 (02/29 1200) BP: (114-170)/(61-108) 170/108 (02/29 1200) SpO2:  [95 %-100 %] 100 % (02/29 1200)   PHYSICAL EXAM:  General: Alert, cooperative, no distress at rest , appears stated age.  Lungs:b/l air entry. Heart: irregular Abdomen: not examined as she says the pain worsens on touching Extremities: atraumatic, no cyanosis. No edema. No clubbing Skin: No rashes or lesions. Or bruising Lymph: Cervical, supraclavicular normal. Neurologic: Grossly non-focal  Lab Results Recent Labs    10/05/22 0516 10/06/22 0454 10/07/22 0642  WBC 5.4 5.2 5.0  HGB 10.0* 9.7* 10.9*  HCT 30.6* 29.0* 33.1*  NA 131* 132*  --   K 3.8 3.7  --   CL 105 104  --   CO2 19* 22  --   BUN 17 17  --   CREATININE 1.00 1.02*  --    Liver Panel Recent Labs    10/05/22 0516  10/06/22 0454  PROT 5.7* 5.6*  ALBUMIN 1.7* 1.5*  AST 37 41  ALT 14 14  ALKPHOS 48 62  BILITOT 1.5* 1.5*    Microbiology: 10/02/22 BC- strep pneumo 1 of 4 10/04/22 BC- NG Studies/Results: No results found.   Assessment/Plan: Strep pneumo bacteremia Echo Questions mitral valve vegetation Patient does not want TEE- so will treat her with 4-6 weeks of IV antibiotic- on discharge it will be ceftriaxone   Cholelithiasis and acute cholecystitis- will need Percutaneous cholecystostomy and bile sent for culture Patient has refused any sort of intervention. Consider repeating imaging Currently on unasyn     Afib on amiodarone   progressive bladder cancer on Keytruda. ? Mildly elevated bilirubin   Normal kidney function though CT scan showed mild left show hydronephrosis and hydroureter due to the bladder cancer   Discussed the management with patient, her niece at bed side  and with oncologist and hospitalist

## 2022-10-07 NOTE — TOC Benefit Eligibility Note (Signed)
Patient Advocate Encounter  Insurance verification completed.    The patient is currently admitted and upon discharge could be taking Eliquis 5 mg.  The current 30 day co-pay is $0.00.   The patient is insured through [employer] Medicare Part D   Jaray Boliver, CPHT Pharmacy Patient Advocate Specialist [facility] Patient Advocate Team Direct Number: [phone]  Fax: [phone]       

## 2022-10-07 NOTE — Progress Notes (Signed)
Progress Note   Patient: Kristina Ellis [id number] DOB: 16-Jun-1951 DOA: 10/02/2022     5 DOS: the patient was seen and examined on 10/07/2022   Brief hospital course: 72 year old F with PMH of A-fib on Eliquis, cholelithiasis, bladder cancer, HTN, CAD and hepatitis presenting with RUQ pain, nausea and vomiting.  Workup including CT abdomen and pelvis and MRCP concerning for acute calculus cholecystitis, hepatic cirrhosis and heterogeneous hepatic steatosis.  General surgery consulted and recommended percutaneous drain but IR feels patient is a poor candidate for percutaneous cholecystostomy due to cirrhosis and ascites with increased risk of bleeding and bacterial peritonitis, and recommended conservative management.  Patient is not interested in surgical intervention either.  Patient is on IV Zosyn.  General surgery following.   Blood culture with Streptococcus pneumonia.  Echocardiogram concerning for mitral valve vegetation.  ID consulted and following.   Patient is also in A-fib with RVR.  Started on amiodarone drip.   2/28.  Patient declined cholecystectomy tube with the general surgery team.  Still having abdominal pain.  Taken off amiodarone drip on metoprolol and Cardizem. 2/29.  Patient again refused cholecystectomy tube.  Will change heparin drip over to Eliquis.  Try to advance diet to see how things go.  Assessment and Plan: * Severe sepsis ([place]) Present on admission, with Streptococcus pneumoniae growing out of blood cultures.  Seen by infectious disease and recommended 4 to 6 weeks of IV antibiotics.  Currently on Unasyn.  Acute cholecystitis Patient declined cholecystectomy tube placement.  Currently on Unasyn.  General surgery following.  Not a great surgical candidate.  Try to advance diet  Atrial fibrillation with rapid ventricular response (HCC) Paroxysmal in nature.  Currently rate controlled on metoprolol and Cardizem.  Since patient refusing procedure we will get rid  of heparin drip and put back on Eliquis.  Hypotension Continue midodrine.  Bladder cancer [facility]) Will need follow-up as outpatient.  Iron deficiency anemia Continue to monitor hemoglobin especially being on blood thinners.  Hypoalbuminemia Albumin 1.5.  Hyponatremia Sodium 132  Liver cirrhosis (HCC) Seen on imaging.  Heart failure with mildly reduced ejection fraction (HCC) EF 45% with moderate mitral valve regurgitation        Subjective: Patient did have some abdominal pain with initial touch but after the initial touch she did not have pain when I was palpating.  Treating for sepsis and acute cholecystitis.  Physical Exam: Vitals:   10/07/22 0900 10/07/22 1000 10/07/22 1100 10/07/22 1200  BP: 129/83 (!) 144/97 (!) 135/91 (!) 170/108  Pulse:  92 81 85  Resp: 13 18 (!) 35 17  Temp:    98.1 F (36.7 C)  TempSrc:    Oral  SpO2:  99% 100% 100%  Weight:      Height:       Physical Exam HENT:     Head: Normocephalic.     Mouth/Throat:     Pharynx: No oropharyngeal exudate.  Eyes:     General: Lids are normal.     Conjunctiva/sclera: Conjunctivae normal.  Cardiovascular:     Rate and Rhythm: Normal rate. Rhythm irregularly irregular.     Heart sounds: Normal heart sounds, S1 normal and S2 normal.  Pulmonary:     Breath sounds: No decreased breath sounds, wheezing, rhonchi or rales.  Abdominal:     Palpations: Abdomen is soft.     Tenderness: There is abdominal tenderness in the epigastric area.  Musculoskeletal:     Right lower leg: No swelling.  Left lower leg: No swelling.  Skin:    General: Skin is warm.     Findings: No rash.  Neurological:     Mental Status: She is alert and oriented to person, place, and time.     Data Reviewed: Last creatinine 1.02, hemoglobin 10.9, MCV 103.8, ferritin 92  Family Communication: Updated patient's sister on the phone  Disposition: Status is: Inpatient Remains inpatient appropriate because: Being treated  for acute cholecystitis with conservative management IV antibiotics.  Planned Discharge Destination: Home    Time spent: 28 minutes  Author: Loletha Grayer, MD 10/07/2022 12:10 PM  For on call review [email or web address].

## 2022-10-07 NOTE — Assessment & Plan Note (Addendum)
Continue to monitor hemoglobin especially being on blood thinners.  Last hemoglobin 10.9.

## 2022-10-07 NOTE — TOC Initial Note (Signed)
Transition of Care [facility]) - Initial/Assessment Note    Patient Details  Name: Kristina Ellis MRN: [id number] Date of Birth: 1951/03/19  Transition of Care [facility]) CM/SW Contact:    Laurena Slimmer, RN Phone Number: 10/07/2022, 9:51 AM  Clinical Narrative:                 Spoke with patient at bedside regarding therapy recommendations. Patient is agreeable to [facility] services upon discharge. She does not have a preference of an [facility] agency.           Patient Goals and CMS Choice            Expected Discharge Plan and Services                                              Prior Living Arrangements/Services                       Activities of Daily Living Home Assistive Devices/Equipment: None ADL Screening (condition at time of admission) Patient's cognitive ability adequate to safely complete daily activities?: Yes Is the patient deaf or have difficulty hearing?: No Does the patient have difficulty seeing, even when wearing glasses/contacts?: No Does the patient have difficulty concentrating, remembering, or making decisions?: No Patient able to express need for assistance with ADLs?: Yes Does the patient have difficulty dressing or bathing?: Yes Independently performs ADLs?: Yes (appropriate for developmental age) Does the patient have difficulty walking or climbing stairs?: Yes Weakness of Legs: Both Weakness of Arms/Hands: Right  Permission Sought/Granted                  Emotional Assessment              Admission diagnosis:  Acute cholecystitis [K81.0] Cholecystitis [K81.9] Atrial fibrillation with RVR ([place]) [I48.91] Sepsis, due to unspecified organism, unspecified whether acute organ dysfunction present [facility]) [A41.9] Patient Active Problem List   Diagnosis Date Noted   Acute cholecystitis 10/06/2022   Liver cirrhosis ([place]) 10/06/2022   Hyponatremia 10/06/2022   Hypoalbuminemia 10/06/2022   Anemia 10/06/2022   Palliative care  encounter 10/06/2022   Goals of care, counseling/discussion 10/05/2022   DNR (do not resuscitate) 10/05/2022   Heart failure with mildly reduced ejection fraction ([place]) 10/05/2022   Mitral valve vegetation 10/05/2022   Severe sepsis ([place]) 10/03/2022   Bacteremia due to Streptococcus pneumoniae 10/03/2022   Abdominal pain    Atrial fibrillation with rapid ventricular response ([place]) 04/11/2022   Hypomagnesemia 04/11/2022   Chest pain 04/11/2022   Alcohol use 04/11/2022   Diarrhea 04/11/2022   Calculus of common duct    Duodenal stenosis    UTI (urinary tract infection) 12/17/2021   Arthritis of left shoulder region 09/22/2021   Pain of cervical spine 09/22/2021   Cervical radiculopathy 09/22/2021   Cervical spondylosis 09/22/2021   Hypotension 08/09/2021   Lactic acidosis 08/09/2021   Elevated troponin level not due myocardial infarction 08/09/2021   Acute pain of left shoulder 08/09/2021   Bladder cancer ([place]) 05/29/2021   Atrial fibrillation with RVR ([place]) 07/03/2020   Moderate pulmonary arterial systolic hypertension ([place]) 12/22/2019   Severe tricuspid regurgitation 12/22/2019   Moderate mitral regurgitation 12/22/2019   Coronary artery disease involving native coronary artery of native heart without angina pectoris 12/21/2019   NSTEMI (non-ST elevated myocardial infarction) ([place]) 07/26/2016  Open-angle glaucoma 09/11/2010   PCP:  Center, [place]:   [place], [place] [address] [place] [address] Phone: [phone] Fax: [phone]  [place] [address]), [place]) [place] [address] Phone: [phone] Fax: [phone]     Social Determinants of Health (SDOH) Social History: [place]: No Food Insecurity (10/03/2022)  Housing: Low Risk  (10/03/2022)  Transportation  Needs: No Transportation Needs (10/03/2022)  Utilities: Not At Risk (10/03/2022)  Alcohol Screen: Low Risk  (06/18/2022)  Depression (PHQ2-9): Low Risk  (06/18/2022)  Financial Resource Strain: Low Risk  (06/18/2022)  Physical Activity: Inactive (06/18/2022)  Social Connections: Socially Isolated (06/18/2022)  Stress: No Stress Concern Present (06/18/2022)  Tobacco Use: Medium Risk (10/02/2022)   SDOH Interventions:     Readmission Risk Interventions     No data to display

## 2022-10-07 NOTE — TOC Progression Note (Signed)
Transition of Care [facility]) - Progression Note    Patient Details  Name: Kristina Ellis MRN: [id number] Date of Birth: 1950-10-31  Transition of Care [facility]) CM/SW Contact  Laurena Slimmer, RN Phone Number: 10/07/2022, 10:05 AM  Clinical Narrative:    Referral for [facility] sent to [facility] at [facility].         Expected Discharge Plan and Services                                               Social Determinants of Health (SDOH) Interventions SDOH Screenings   Food Insecurity: No Food Insecurity (10/03/2022)  Housing: Low Risk  (10/03/2022)  Transportation Needs: No Transportation Needs (10/03/2022)  Utilities: Not At Risk (10/03/2022)  Alcohol Screen: Low Risk  (06/18/2022)  Depression (PHQ2-9): Low Risk  (06/18/2022)  Financial Resource Strain: Low Risk  (06/18/2022)  Physical Activity: Inactive (06/18/2022)  Social Connections: Socially Isolated (06/18/2022)  Stress: No Stress Concern Present (06/18/2022)  Tobacco Use: Medium Risk (10/02/2022)    Readmission Risk Interventions     No data to display

## 2022-10-07 NOTE — Progress Notes (Signed)
[place] NOTE       Patient ID: Kristina Ellis MRN: [id number] DOB/AGE: 11-06-1950 72 y.o.  Admit date: 10/02/2022 Referring Physician Dr. Wendee Beavers  Primary Physician Princella Ion [facility] Primary Cardiologist Dr. Clayborn Bigness Reason for Consultation AF RVR, MV veg  HPI: Kristina Ellis is a 45yoF with a PMH of paroxysmal AF (apixaban, amiodarone) s/p TEE/DCCV 01/2020 [facility]), mod MR, PR, mod-sev TR, mild AR, liver cirrhosis, stage II bladder cancer s/p TURBT, XRT, and chemo, who presented to [facility] ED 10/02/22 with RUQ abdominal pain, nausea and vomiting. Febrile on admission, and CT abd/pelvis & MRCP c/f acute cholecystitis, pending possible IR cholecystostomy tube. Blood cx positive for strep pneumo bacteremia. Cardiology is consulted on hospital day 2 for assistance with her AF RVR & mitral valve veg on TTE.   Interval History:  - no acute events - no chest pain, palpitations, or shortness of breath - remains in AF, rate controlled in the 60s-90s  Review of systems complete and found to be negative unless listed above     Past Medical History:  Diagnosis Date   Aortic atherosclerosis (HCC)    Arthritis    Atrial fibrillation ([place])    a.) s/p TEE with cardioversion on 02/04/2020. b.) on daily apixaban.   Bladder mass    a.) CT 04/08/2021 --> 3.2 cm intraluminal bladder mass.   Carotid stenosis, bilateral    a.) Doppler 07/04/2020 --> mild; 1-49% stenosis BILATERALLY.   Cholelithiasis    a.) CT 11/06/2020 --> largest measured 4.5 cm   Chronic anticoagulation    a.) Apixaban   Common biliary duct calculus    a.) CT 04/08/2021 --> CBD dilated at 8 mm; 24m calculus within distal CBD.   Coronary artery disease involving native coronary artery of native heart without angina pectoris [id number]  Diastolic dysfunction    a.) TTE 07/04/2020 --> LVEF 60-65%; G1DD.   Hepatic cirrhosis (HCC)    Hepatic steatosis    Hepatitis 1970   History of 2019  novel coronavirus disease (COVID-19) 12/20/2019   History of marijuana use    Hypertension    Hypertensive retinopathy    IDA (iron deficiency anemia)    Mitral stenosis    a.) TEE 04/10/2020 --> mild. b.) TTE 07/04/2020 --> EF 60-65%; mild (mean gradient 5 mmHg).   Moderate pulmonary arterial systolic hypertension ([place] 12/22/2019   Nephrolithiasis    NSTEMI (non-ST elevated myocardial infarction) ([place] 07/26/2016   Open-angle glaucoma 09/11/2010   PAH (pulmonary artery hypertension) ([place]    a.) TTE 12/21/2019 --> PASP 44 mmHg.   Uterine fibroid    a.) CT 04/08/2021 --> multiple with largest measuring 7 cm.   Valvular regurgitation    a.) TTE 09/01/2010 --> trivial to mild pan-valvular. b.) TTE 12/21/2019 --> mild MR and AR; moderate TR. c.) TTE 07/04/2020 --> mild TR; trivial MR and PR.   Vestibular neuronitis     Past Surgical History:  Procedure Laterality Date   BREAST CYST ASPIRATION Left    COLONOSCOPY  2012   ERCP N/A 12/18/2021   Procedure: ENDOSCOPIC RETROGRADE CHOLANGIOPANCREATOGRAPHY (ERCP);  Surgeon: WLucilla Lame MD;  [facility]  Service: Endoscopy;  Laterality: N/A;   TEE WITH CARDIOVERSION N/A 02/04/2020   Procedure: TEE WITH CARDIOVERSION; Location: [facility]; Surgeon: CKandis Cocking MD   TRANSURETHRAL RESECTION OF BLADDER TUMOR N/A 05/15/2021   Procedure: TRANSURETHRAL RESECTION OF BLADDER TUMOR (TURBT);  Surgeon: SBilley Co MD;  Location: [facility] ORS;  Service:  Urology;  Laterality: N/A;    Medications Prior to Admission  Medication Sig Dispense Refill Last Dose   amiodarone (PACERONE) 200 MG tablet Take by mouth.   10/02/2022   magnesium oxide (MAG-OX) 400 MG tablet Take 1 tablet by mouth 2 (two) times daily.   Past Month   metoprolol tartrate (LOPRESSOR) 100 MG tablet Take 1 tablet by mouth 2 (two) times daily.   10/02/2022   potassium chloride SA (KLOR-CON M) 20 MEQ tablet Take 1 tablet (20 mEq total) by mouth 2 (two) times daily. 90 tablet 1 [id number]    trolamine salicylate (ASPERCREME) 10 % cream Apply 1 application topically as needed for muscle pain.   Past Month   atorvastatin (LIPITOR) 40 MG tablet Take 40 mg by mouth at bedtime. (Patient not taking: Reported on 08/31/2022)   Not Taking   diltiazem (CARDIZEM CD) 120 MG 24 hr capsule Take 1 capsule (120 mg total) by mouth daily. (Patient not taking: Reported on 08/31/2022) 30 capsule 0 Not Taking   ELIQUIS 5 MG TABS tablet Take 1 tablet (5 mg total) by mouth 2 (two) times daily. 60 tablet 1    metoprolol succinate (TOPROL-XL) 25 MG 24 hr tablet Take 1 tablet (25 mg total) by mouth 2 (two) times daily. Take with or immediately following a meal. 60 tablet 0    Multiple Vitamin (MULTIVITAMIN WITH MINERALS) TABS tablet Take 1 tablet by mouth daily. (Patient not taking: Reported on 08/31/2022) 90 tablet 0 Not Taking   oxybutynin (DITROPAN-XL) 10 MG 24 hr tablet Take 1 tablet (10 mg total) by mouth daily. (Patient not taking: Reported on 08/31/2022) 30 tablet 11 Not Taking   Social History   Socioeconomic History   Marital status: Single    Spouse name: Not on file   Number of children: 1   Years of education: Not on file   Highest education level: Not on file  Occupational History   Not on file  Tobacco Use   Smoking status: Former    Packs/day: 0.10    Years: 10.00    Total pack years: 1.00    Types: Cigarettes    Passive exposure: Past   Smokeless tobacco: Never   Tobacco comments:    occasional smoker  Vaping Use   Vaping Use: Never used  Substance and Sexual Activity   Alcohol use: Yes    Alcohol/week: 5.0 standard drinks of alcohol    Types: 5 Cans of beer per week    Comment: weekly   Drug use: Not Currently    Types: 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, 70's   Sexual activity: Not on file  Other Topics Concern   Not on file  Social History Narrative   Lives with son   Social Determinants of Health   Financial Resource Strain: Low Risk  (06/18/2022)   Overall  Financial Resource Strain (CARDIA)    Difficulty of Paying Living Expenses: Not very hard  Food Insecurity: No Food Insecurity (10/03/2022)   Hunger Vital Sign    Worried About Running Out of Food in the Last Year: Never true    Ran Out of Food in the Last Year: Never true  Transportation Needs: No Transportation Needs (10/03/2022)   PRAPARE - Hydrologist (Medical): No    Lack of Transportation (Non-Medical): No  Physical Activity: Inactive (06/18/2022)   Exercise Vital Sign    Days of Exercise per Week: 0 days    Minutes of  Exercise per Session: 0 min  Stress: No Stress Concern Present (06/18/2022)   [place]    Feeling of Stress : Only a little  Social Connections: Socially Isolated (06/18/2022)   Social Connection and Isolation Panel [NHANES]    Frequency of Communication with Friends and Family: More than three times a week    Frequency of Social Gatherings with Friends and Family: More than three times a week    Attends Religious Services: Never    Marine scientist or Organizations: No    Attends Archivist Meetings: Never    Marital Status: Never married  Intimate Partner Violence: Not At Risk (10/03/2022)   Humiliation, Afraid, Rape, and Kick questionnaire    Fear of Current or Ex-Partner: No    Emotionally Abused: No    Physically Abused: No    Sexually Abused: No    Family History  Problem Relation Age of Onset   Aneurysm Mother    Colon cancer Father    Lung cancer Brother    Breast cancer Neg Hx       Intake/Output Summary (Last 24 hours) at 10/07/2022 0841 Last data filed at 10/06/2022 1800 Gross per 24 hour  Intake 1188.77 ml  Output 200 ml  Net 988.77 ml     Vitals:   10/06/22 2123 10/06/22 2318 10/06/22 2319 10/07/22 0300  BP: 130/72 131/64  119/86  Pulse: 86 77 75 62  Resp: 16 (!) '22 11 18  '$ Temp: 98.1 F (36.7 C) 98 F (36.7 C)     TempSrc: Oral Oral    SpO2: 100% 95% 98% 97%  Weight:      Height:        PHYSICAL EXAM General: elderly thin black female, in no acute distress. Laying flat in bed. HEENT:  Normocephalic and atraumatic. Neck:  No JVD.  Lungs: Normal respiratory effort on room air. Clear bilaterally to auscultation. No wheezes, crackles, rhonchi. Heart: irregularly irregular with controlled rate . Normal S1 and S2 without gallops or murmurs.  Abdomen: Non-distended appearing.  Msk: Normal strength and tone for age. Extremities: Warm and well perfused. No clubbing, cyanosis.  No peripheral edema.  Neuro: Alert and oriented X 3. Psych:  Answers questions appropriately.   Labs: Basic Metabolic Panel: Recent Labs    10/05/22 0516 10/06/22 0454  NA 131* 132*  K 3.8 3.7  CL 105 104  CO2 19* 22  GLUCOSE 90 90  BUN 17 17  CREATININE 1.00 1.02*  CALCIUM 7.5* 7.6*  MG 2.0 1.8  PHOS 2.7 2.6    Liver Function Tests: Recent Labs    10/05/22 0516 10/06/22 0454  AST 37 41  ALT 14 14  ALKPHOS 48 62  BILITOT 1.5* 1.5*  PROT 5.7* 5.6*  ALBUMIN 1.7* 1.5*    No results for input(s): "LIPASE", "AMYLASE" in the last 72 hours.  CBC: Recent Labs    10/06/22 0454 10/07/22 0642  WBC 5.2 5.0  HGB 9.7* 10.9*  HCT 29.0* 33.1*  MCV 103.9* 103.8*  PLT 196 197    Cardiac Enzymes: No results for input(s): "CKTOTAL", "CKMB", "CKMBINDEX", "TROPONINIHS" in the last 72 hours.  BNP: Recent Labs    10/04/22 1503  BNP 285.4*    D-Dimer: No results for input(s): "DDIMER" in the last 72 hours. Hemoglobin A1C: No results for input(s): "HGBA1C" in the last 72 hours. Fasting Lipid Panel: No results for input(s): "CHOL", "HDL", "LDLCALC", "TRIG", "CHOLHDL", "LDLDIRECT" in  the last 72 hours. Thyroid Function Tests: No results for input(s): "TSH", "T4TOTAL", "T3FREE", "THYROIDAB" in the last 72 hours.  Invalid input(s): "FREET3"  Anemia Panel: Recent Labs    10/07/22 0642  FERRITIN 92      Radiology: ECHOCARDIOGRAM COMPLETE  Result Date: 10/04/2022    ECHOCARDIOGRAM REPORT   Patient Name:   Kristina Ellis Date of Exam: 10/04/2022 Medical Rec #:  [id number]        Height:       59.0 in Accession #:    [id number]       Weight:       120.1 lb Date of Birth:  1951/07/18         BSA:          1.485 m Patient Age:    33 years         BP:           131/120 mmHg Patient Gender: F                HR:           113 bpm. Exam Location:  [facility] Procedure: 2D Echo, Cardiac Doppler and Color Doppler Indications:     Bacteremia  History:         Patient has prior history of Echocardiogram examinations, most                  recent 08/10/2021. CAD and Previous Myocardial Infarction,                  Pulmonary HTN, Arrythmias:Atrial Fibrillation;                  Signs/Symptoms:Bacteremia and Chest Pain. Bladder CA.  Sonographer:     Wenda Low Referring Phys:  [id number] Charlesetta Ivory GONFA Diagnosing Phys: Kathlyn Sacramento MD IMPRESSIONS  1. Left ventricular ejection fraction, by estimation, is 45 to 50%. The left ventricle has mildly decreased function. Left ventricular endocardial border not optimally defined to evaluate regional wall motion. There is moderate left ventricular hypertrophy. Left ventricular diastolic parameters are indeterminate.  2. Right ventricular systolic function is normal. The right ventricular size is normal. There is moderately elevated pulmonary artery systolic pressure.  3. Left atrial size was severely dilated.  4. Small vegetation on the mitral valve.  5. The mitral valve is abnormal. Moderate mitral valve regurgitation. No evidence of mitral stenosis. Severe mitral annular calcification.  6. Tricuspid valve regurgitation is moderate.  7. The aortic valve is normal in structure. Aortic valve regurgitation is trivial. Mild aortic valve stenosis. Aortic valve area, by VTI measures 1.43 cm. Aortic valve mean gradient measures 6.0 mmHg. Conclusion(s)/Recommendation(s): Findings concerning for  mitral valve vegetation, would recommend a Transesophageal Echocardiogram for clarification. FINDINGS  Left Ventricle: Left ventricular ejection fraction, by estimation, is 45 to 50%. The left ventricle has mildly decreased function. Left ventricular endocardial border not optimally defined to evaluate regional wall motion. The left ventricular internal cavity size was normal in size. There is moderate left ventricular hypertrophy. Left ventricular diastolic parameters are indeterminate. Right Ventricle: The right ventricular size is normal. No increase in right ventricular wall thickness. Right ventricular systolic function is normal. There is moderately elevated pulmonary artery systolic pressure. The tricuspid regurgitant velocity is 3.16 m/s, and with an assumed right atrial pressure of 8 mmHg, the estimated right ventricular systolic pressure is [id number] mmHg. Left Atrium: Left atrial size was severely dilated. Right Atrium: Right atrial size was normal in  size. Pericardium: There is no evidence of pericardial effusion. Mitral Valve: The mitral valve is abnormal. There is moderate thickening of the mitral valve leaflet(s). There is moderate calcification of the mitral valve leaflet(s). Severe mitral annular calcification. Moderate mitral valve regurgitation. No evidence  of mitral valve stenosis. MV peak gradient, 16.2 mmHg. The mean mitral valve gradient is 7.0 mmHg. Tricuspid Valve: The tricuspid valve is normal in structure. Tricuspid valve regurgitation is moderate . No evidence of tricuspid stenosis. Aortic Valve: The aortic valve is normal in structure. Aortic valve regurgitation is trivial. Mild aortic stenosis is present. Aortic valve mean gradient measures 6.0 mmHg. Aortic valve peak gradient measures 12.8 mmHg. Aortic valve area, by VTI measures  1.43 cm. Pulmonic Valve: The pulmonic valve was normal in structure. Pulmonic valve regurgitation is trivial. No evidence of pulmonic stenosis. Aorta: The  aortic root is normal in size and structure. Venous: The inferior vena cava was not well visualized. IAS/Shunts: No atrial level shunt detected by color flow Doppler.  LEFT VENTRICLE PLAX 2D LVIDd:         3.50 cm LVIDs:         2.70 cm LV PW:         1.20 cm LV IVS:        1.30 cm LVOT diam:     1.90 cm LV SV:         39 LV SV Index:   27 LVOT Area:     2.84 cm  RIGHT VENTRICLE RV Basal diam:  3.20 cm RV Mid diam:    2.40 cm RV S prime:     13.50 cm/s LEFT ATRIUM             Index        RIGHT ATRIUM           Index LA diam:        5.00 cm 3.37 cm/m   RA Area:     14.50 cm LA Vol (A2C):   69.7 ml 46.93 ml/m  RA Volume:   37.90 ml  25.52 ml/m LA Vol (A4C):   86.6 ml 58.31 ml/m LA Biplane Vol: 79.4 ml 53.46 ml/m  AORTIC VALVE                     PULMONIC VALVE AV Area (Vmax):    1.71 cm      PV Vmax:       1.01 m/s AV Area (Vmean):   1.53 cm      PV Peak grad:  4.1 mmHg AV Area (VTI):     1.43 cm AV Vmax:           179.00 cm/s AV Vmean:          112.000 cm/s AV VTI:            0.275 m AV Peak Grad:      12.8 mmHg AV Mean Grad:      6.0 mmHg LVOT Vmax:         108.00 cm/s LVOT Vmean:        60.300 cm/s LVOT VTI:          0.139 m LVOT/AV VTI ratio: 0.51  AORTA Ao Root diam: 3.20 cm Ao Asc diam:  2.70 cm MITRAL VALVE                TRICUSPID VALVE MV Area (PHT): 2.91 cm     TR Peak grad:   39.9 mmHg  MV Area VTI:   1.68 cm     TR Vmax:        316.00 cm/s MV Peak grad:  16.2 mmHg MV Mean grad:  7.0 mmHg     SHUNTS MV Vmax:       2.01 m/s     Systemic VTI:  0.14 m MV Vmean:      116.0 cm/s   Systemic Diam: 1.90 cm MV Decel Time: 261 msec MV E velocity: 159.00 cm/s Kathlyn Sacramento MD Electronically signed by Kathlyn Sacramento MD Signature Date/Time: 10/04/2022/5:20:56 PM    Final    DG Chest Port 1 View  Result Date: 10/04/2022 CLINICAL DATA:  Shortness of breath. EXAM: PORTABLE CHEST 1 VIEW COMPARISON:  Chest radiograph and CT 10/02/2022 FINDINGS: The patient is rotated to the right. The cardiac silhouette is  borderline enlarged. There is extensive mitral annular calcification. Lung volumes are low with mild interstitial densities in the lung bases. No overt pulmonary edema, lobar consolidation, sizeable pleural effusion, or pneumothorax is identified. Advanced bilateral glenohumeral arthropathy is again noted. IMPRESSION: Low lung volumes with mild bibasilar opacities which could reflect atelectasis or early infection. Electronically Signed   By: Logan Bores M.D.   On: 10/04/2022 15:15   MR ABDOMEN MRCP W WO CONTAST  Result Date: 10/03/2022 CLINICAL DATA:  72 year old female with suspected biliary obstruction. EXAM: MRI ABDOMEN WITHOUT AND WITH CONTRAST (INCLUDING MRCP) TECHNIQUE: Multiplanar multisequence MR imaging of the abdomen was performed both before and after the administration of intravenous contrast. Heavily T2-weighted images of the biliary and pancreatic ducts were obtained, and three-dimensional MRCP images were rendered by post processing. CONTRAST:  32m GADAVIST GADOBUTROL 1 MMOL/ML IV SOLN COMPARISON:  Abdominal MRI 04/11/2022. Right upper quadrant abdominal ultrasound 10/03/2022. CT of the abdomen and pelvis 10/02/2022. FINDINGS: Lower chest: Small right pleural effusion lying dependently. Hepatobiliary: Diffuse but heterogeneous loss of signal intensity throughout the hepatic parenchyma on out of phase dual echo images, indicative of a background of heterogeneous hepatic steatosis. Liver has a shrunken appearance and very nodular contour, indicative of underlying cirrhosis. No definite suspicious cystic or solid hepatic lesions are confidently identified. No intra or extrahepatic biliary ductal dilatation noted on MRCP images. Common bile duct measures 6 mm in the porta hepatis. No filling defect within the common bile duct to suggest choledocholithiasis. However, there is heterogeneous signal intensity within the lumen of the gallbladder, most notable for a large signal void which measures up to  4.5 x 3.0 cm, compatible with a large stone. Gallbladder wall is irregularly thickened and edematous measuring up to 1 cm, with surrounding pericholecystic fluid and inflammatory changes. Pancreas: No pancreatic mass. No pancreatic ductal dilatation. No pancreatic or peripancreatic fluid collections or inflammatory changes. Spleen:  Unremarkable. Adrenals/Urinary Tract: Bilateral kidneys and bilateral adrenal glands are normal in appearance. No hydroureteronephrosis in the visualized portions of the abdomen. Stomach/Bowel: Visualized portions are unremarkable. Vascular/Lymphatic: No aneurysm identified in the visualized abdominal vasculature. No lymphadenopathy noted in the abdomen. Other: Trace volume of ascites. Inflammatory changes centered around the gallbladder in the right upper quadrant of the abdomen. Incidental imaging of the pelvis demonstrates an enlarged uterus with multiple lesions, presumably fibroids. Musculoskeletal: No aggressive appearing osseous lesions are noted in the visualized portions of the skeleton. IMPRESSION: 1. Cholelithiasis with imaging findings highly concerning for acute cholecystitis. Surgical consultation is recommended. 2. No choledocholithiasis or findings of biliary tract obstruction. 3. Hepatic cirrhosis and heterogeneous hepatic steatosis. No aggressive appearing hepatic lesion noted at this  time. 4. Trace volume of ascites. 5. Small right pleural effusion lying dependently. Electronically Signed   By: Vinnie Langton M.D.   On: 10/03/2022 05:55   MR 3D Recon At Scanner  Result Date: 10/03/2022 CLINICAL DATA:  72 year old female with suspected biliary obstruction. EXAM: MRI ABDOMEN WITHOUT AND WITH CONTRAST (INCLUDING MRCP) TECHNIQUE: Multiplanar multisequence MR imaging of the abdomen was performed both before and after the administration of intravenous contrast. Heavily T2-weighted images of the biliary and pancreatic ducts were obtained, and three-dimensional MRCP  images were rendered by post processing. CONTRAST:  61m GADAVIST GADOBUTROL 1 MMOL/ML IV SOLN COMPARISON:  Abdominal MRI 04/11/2022. Right upper quadrant abdominal ultrasound 10/03/2022. CT of the abdomen and pelvis 10/02/2022. FINDINGS: Lower chest: Small right pleural effusion lying dependently. Hepatobiliary: Diffuse but heterogeneous loss of signal intensity throughout the hepatic parenchyma on out of phase dual echo images, indicative of a background of heterogeneous hepatic steatosis. Liver has a shrunken appearance and very nodular contour, indicative of underlying cirrhosis. No definite suspicious cystic or solid hepatic lesions are confidently identified. No intra or extrahepatic biliary ductal dilatation noted on MRCP images. Common bile duct measures 6 mm in the porta hepatis. No filling defect within the common bile duct to suggest choledocholithiasis. However, there is heterogeneous signal intensity within the lumen of the gallbladder, most notable for a large signal void which measures up to 4.5 x 3.0 cm, compatible with a large stone. Gallbladder wall is irregularly thickened and edematous measuring up to 1 cm, with surrounding pericholecystic fluid and inflammatory changes. Pancreas: No pancreatic mass. No pancreatic ductal dilatation. No pancreatic or peripancreatic fluid collections or inflammatory changes. Spleen:  Unremarkable. Adrenals/Urinary Tract: Bilateral kidneys and bilateral adrenal glands are normal in appearance. No hydroureteronephrosis in the visualized portions of the abdomen. Stomach/Bowel: Visualized portions are unremarkable. Vascular/Lymphatic: No aneurysm identified in the visualized abdominal vasculature. No lymphadenopathy noted in the abdomen. Other: Trace volume of ascites. Inflammatory changes centered around the gallbladder in the right upper quadrant of the abdomen. Incidental imaging of the pelvis demonstrates an enlarged uterus with multiple lesions, presumably  fibroids. Musculoskeletal: No aggressive appearing osseous lesions are noted in the visualized portions of the skeleton. IMPRESSION: 1. Cholelithiasis with imaging findings highly concerning for acute cholecystitis. Surgical consultation is recommended. 2. No choledocholithiasis or findings of biliary tract obstruction. 3. Hepatic cirrhosis and heterogeneous hepatic steatosis. No aggressive appearing hepatic lesion noted at this time. 4. Trace volume of ascites. 5. Small right pleural effusion lying dependently. Electronically Signed   By: DVinnie LangtonM.D.   On: 10/03/2022 05:55   [place] LIMITED RUQ (LIVER/GB)  Result Date: 10/03/2022 CLINICAL DATA:  Abdominal pain. EXAM: ULTRASOUND ABDOMEN LIMITED RIGHT UPPER QUADRANT COMPARISON:  April 11, 2022 FINDINGS: Gallbladder: Large echogenic gallstones are seen within the lumen of a distended gallbladder (the largest measures approximately 3.0 cm). This is seen on the prior study. Stable gallbladder wall thickening (5.4 mm) and pericholecystic fluid are also noted. The presence or absence of a sonographic MPercell Millersign was not provided by the sonographer. Common bile duct: Diameter: 10.1 mm (measured 9.2 mm on the prior study) Liver: A 1.0 cm x 1.1 cm x 1.0 cm hypoechoic area is seen within the left lobe of the liver. No flow is noted within this region on color Doppler evaluation. The liver parenchyma is nodular in contour and diffusely increased in echogenicity. Portal vein is patent on color Doppler imaging with normal direction of blood flow towards the liver. Other: There  is a mild to moderate amount of perihepatic fluid. IMPRESSION: 1. Cholelithiasis and stable gallbladder wall thickening, which may be secondary to ascites. Sequelae associated with acute cholecystitis cannot be excluded. 2. Hepatic steatosis and hepatic cirrhosis with additional findings that may represent a hepatic cyst within the left lobe. 3. Ascites. Electronically Signed   By:  Virgina Norfolk M.D.   On: 10/03/2022 00:08   CT CHEST ABDOMEN PELVIS W CONTRAST  Result Date: 10/02/2022 CLINICAL DATA:  Sepsis, abdominal pain out of proportion, lactic acidosis. Bladder cancer. EXAM: CT CHEST, ABDOMEN, AND PELVIS WITH CONTRAST TECHNIQUE: Multidetector CT imaging of the chest, abdomen and pelvis was performed following the standard protocol during bolus administration of intravenous contrast. RADIATION DOSE REDUCTION: This exam was performed according to the departmental dose-optimization program which includes automated exposure control, adjustment of the mA and/or kV according to patient size and/or use of iterative reconstruction technique. CONTRAST:  110m OMNIPAQUE IOHEXOL 300 MG/ML  SOLN COMPARISON:  PET CT 08/26/2022, CT abdomen pelvis 04/11/2022 FINDINGS: CT CHEST FINDINGS Cardiovascular: Mild coronary artery calcification. Extensive calcification of the mitral valve annulus. Global cardiac size within normal limits. No pericardial effusion. Central pulmonary arteries are enlarged in keeping with changes of pulmonary arterial hypertension. Moderate atherosclerotic calcification within the thoracic aorta. No aortic aneurysm. Mediastinum/Nodes: Pathologic right paratracheal lymph node demonstrating hypermetabolism on prior PET CT examination is stable measuring 15 mm in short axis diameter at axial image # 17/2. No new pathologic thoracic adenopathy. Esophagus unremarkable. Visualized thyroid unremarkable. Lungs/Pleura: Interval development of small right pleural effusion. Interval development of trace interstitial pulmonary edema, possibly cardiogenic in nature. No pneumothorax. No central obstructing lesion. 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 Stable subacute fractures of the superior endplate of [id number] and the medial right eleventh and twelfth ribs proximal to the costotransverse junction demonstrating  callus is again noted. No acute bone abnormality. No focal lytic lesion identified. CT ABDOMEN PELVIS FINDINGS Hepatobiliary: Cholelithiasis again noted. The gallbladder is distended and a gallstone is seen impacted within the gallbladder neck, similar to prior examination. There has, however, developed increasing pericholecystic infiltration which may relate to inflammatory changes the gallbladder, as can be seen with calculus cholecystitis, or progressive ascites. Cirrhosis. No enhancing intrahepatic mass. No intra or extrahepatic biliary ductal dilation. Pancreas: Unremarkable Spleen: Unremarkable Adrenals/Urinary Tract: The adrenal glands are unremarkable. The kidneys are normal in size and position. Mild left hydronephrosis and hydroureter to the level of the left ureterovesicular junction is stable since prior PET CT examination. No hydronephrosis on the right. No intrarenal or ureteral calculi. No enhancing intrarenal masses. The bladder is unremarkable. Stomach/Bowel: There is subtle asymmetric bowel wall thickening involving the hepatic flexure of the colon adjacent to the gallbladder suggesting an adjacent inflammatory process. The stomach, small bowel, and large bowel are otherwise unremarkable. Appendix normal. Mild ascites is present, new since prior PET CT examination. No free intraperitoneal gas. Vascular/Lymphatic: Extensive aortoiliac atherosclerotic calcification. No aortic aneurysm. Stable left periaortic adenopathy since prior PET CT examination where this demonstrate no significant metabolic activity and likely represents the residua of treated disease. No new pathologic adenopathy within the abdomen and pelvis. Reproductive: Multiple hypoenhancing masses are again seen within the uterus most in keeping with multiple uterine fibroids. The pelvic organs are otherwise unremarkable. Other: Increasing diffuse subcutaneous body wall edema in keeping with progressive anasarca. No abdominal wall  hernia. Musculoskeletal: Degenerative changes are seen within the lumbar spine. No acute bone abnormality. No lytic  bone lesion. IMPRESSION: 1. Interval development of small right pleural effusion, trace interstitial pulmonary edema, mild ascites, and progressive diffuse subcutaneous body wall edema in keeping with progressive anasarca and/or cardiogenic failure. 2. Cholelithiasis with gallstone impacted within the gallbladder neck. Interval development of pericholecystic infiltration which may relate to inflammatory changes the gallbladder, as can be seen with calculus cholecystitis, or progressive ascites. Subtle inflammatory change involving the adjacent hepatic flexure of the colon, however, suggests acute cholecystitis. Correlation with liver enzymes and possible right upper quadrant sonography may be helpful for further evaluation. 3. Cirrhosis. 4. Stable mild left hydronephrosis and hydroureter to the level of the left ureterovesicular junction, possibly related to the patient's known underlying bladder malignancy. No mass lesion identified, however. 5. Stable subacute fractures of the superior endplate of 624THL anteriorly and the medial right eleventh and twelfth ribs proximal to the costotransverse junction. No acute bone abnormality identified. 6. Mild coronary artery calcification. 7.  Aortic Atherosclerosis (ICD10-I70.0). Electronically Signed   By: Fidela Salisbury M.D.   On: 10/02/2022 21:53   DG Chest Port 1 View  Result Date: 10/02/2022 CLINICAL DATA:  Sepsis EXAM: PORTABLE CHEST 1 VIEW COMPARISON:  04/11/2022 FINDINGS: Lungs are well expanded, symmetric, and clear. No pneumothorax or pleural effusion. Cardiac size within normal limits. Extensive mitral valve annular calcifications again noted. Pulmonary vascularity is normal. Osseous structures are age-appropriate. Advanced degenerative changes noted within the shoulders bilaterally. No acute bone abnormality. IMPRESSION: 1. No active disease.  Electronically Signed   By: Fidela Salisbury M.D.   On: 10/02/2022 19:38    02/25/22 echo NORMAL LEFT VENTRICULAR SYSTOLIC FUNCTION   WITH MILD LVH  NORMAL RIGHT VENTRICULAR SYSTOLIC FUNCTION  NO VALVULAR STENOSIS  MODERATE MR, PR  MODERATE to SEVERE TR  MILD AR  EF 50-55%   02/22/2022 lexiscan myoview   Moderately abnormal myocardial perfusion scan there is  evidence of inferior borderline defect overall left ventricular function  is moderate to severely depressed 25 to 30% no clear evidence of  reversible ischemia this is a intermediate scan   TELEMETRY reviewed by me (LT) 10/07/2022 : AF RVR 100s-130s  EKG reviewed by me: AF LBBB 151  Data reviewed by me (LT) 10/07/2022: ed note, admission H&P, surgery and hospitalist progress note, last 24h vitals tele labs imaging I/O    Principal Problem:   Severe sepsis ([place]) Active Problems:   Atrial fibrillation with RVR (HCC)   Bladder cancer (HCC)   Hypotension   Elevated troponin level not due myocardial infarction   Coronary artery disease involving native coronary artery of native heart without angina pectoris   Atrial fibrillation with rapid ventricular response ([place])   Bacteremia due to Streptococcus pneumoniae   Goals of care, counseling/discussion   DNR (do not resuscitate)   Heart failure with mildly reduced ejection fraction ([place])   Mitral valve vegetation   Acute cholecystitis   Liver cirrhosis ([place])   Hyponatremia   Hypoalbuminemia   Anemia   Palliative care encounter    ASSESSMENT AND PLAN:  Kristina Ellis is a 83yoF with a PMH of paroxysmal AF (apixaban, amiodarone) s/p TEE/DCCV 01/2020 [facility]), mod MR, PR, mod-sev TR, mild AR, liver cirrhosis, stage II bladder cancer s/p TURBT, XRT, and chemo, who presented to [facility] ED 10/02/22 with RUQ abdominal pain, nausea and vomiting. Febrile on admission, and CT abd/pelvis & MRCP c/f acute cholecystitis, pending possible IR cholecystostomy tube. Blood cx positive for strep pneumo  bacteremia. Cardiology is consulted on hospital day 2 for assistance  with her AF RVR & mitral valve veg on TTE.   # acute cholecystitis Presented with severe RUQ pain, nausea, and vomiting. MRCP with concern for acute cholecystitis for which percutaneous cholecystostomy drain was recommended, although the patient continues to refuse surgical interventions.  Remains on IV antibiotics  # strep pneumo bacteremia # MV veg on TTE On IV Zosyn.  Small vegetation seen on surface echo for which the interpreting physician recommended a TEE for further clarification the patient reports difficulty swallowing over the past several months and a sensation of food getting stuck in her throat.  She is hesitant to undergo TEE because of this, may not be a good candidate if she does have dysphagia/?stricture.  -continues to refuse TEE, ID following. Planning for extended Abx treatment.   # paroxysmal AF RVR  In the setting of acute cholecystitis, fever, and severe pain. Rate controlled in the 60s-90s.  -stop IV amiodarone, hx cirrhosis  -continue metoprolol tartrate to '50mg'$  BID -continue cardizem PO '60mg'$  BID, can eventually consolidate to CD dosing -agree to change heparin back to eliquis '5mg'$  BID as pt is refusing surgical or IR interventions.   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  EF this admission with slight reduction compared to prior from 02/2022 with EF of 50-55%. GDMT with metoprolol, limited by relative hypotension. Consider ARB, MRA. Defer SGLT2i with hx bladder ca.   This patient's plan of care was discussed and created with Dr. Saralyn Pilar and he is in agreement.  Signed: Tristan Schroeder , PA-C 10/07/2022, 8:41 AM [facility]

## 2022-10-07 NOTE — Consult Note (Signed)
ANTICOAGULATION CONSULT NOTE   Pharmacy Consult for Heparin Indication: atrial fibrillation  Allergies  Allergen Reactions   Atenolol Other (See Comments)    bradycardia bradycardia     Patient Measurements: Height: '4\' 11"'$  (149.9 cm) Weight: 54.5 kg (120 lb 2.4 oz) IBW/kg (Calculated) : 43.2 Heparin Dosing Weight: 54.5 kg  Vital Signs: Temp: 98 F (36.7 C) (02/28 2318) Temp Source: Oral (02/28 2318) BP: 131/64 (02/28 2318) Pulse Rate: 75 (02/28 2319)  Labs: Recent Labs    10/04/22 0449 10/05/22 0516 10/05/22 1047 10/06/22 0454 10/06/22 1550 10/07/22 0050  HGB 10.2* 10.0*  --  9.7*  --   --   HCT 30.2* 30.6*  --  29.0*  --   --   PLT 175 197  --  196  --   --   APTT  --   --    < > 136* 65* 104*  LABPROT 19.2* 18.5*  --  23.7*  --   --   INR 1.6* 1.6*  --  2.1*  --   --   HEPARINUNFRC  --   --   --  >1.10*  --   --   CREATININE 0.89 1.00  --  1.02*  --   --    < > = values in this interval not displayed.     Estimated Creatinine Clearance: 38.1 mL/min (A) (by C-G formula based on SCr of 1.02 mg/dL (H)).   Medical History: Past Medical History:  Diagnosis Date   Aortic atherosclerosis ([place])    Arthritis    Atrial fibrillation ([place])    a.) s/p TEE with cardioversion on 02/04/2020. b.) on daily apixaban.   Bladder mass    a.) CT 04/08/2021 --> 3.2 cm intraluminal bladder mass.   Carotid stenosis, bilateral    a.) Doppler 07/04/2020 --> mild; 1-49% stenosis BILATERALLY.   Cholelithiasis    a.) CT 11/06/2020 --> largest measured 4.5 cm   Chronic anticoagulation    a.) Apixaban   Common biliary duct calculus    a.) CT 04/08/2021 --> CBD dilated at 8 mm; 55m calculus within distal CBD.   Coronary artery disease involving native coronary artery of native heart without angina pectoris [id number]  Diastolic dysfunction    a.) TTE 07/04/2020 --> LVEF 60-65%; G1DD.   Hepatic cirrhosis (HCC)    Hepatic steatosis    Hepatitis 1970   History of 2019 novel  coronavirus disease (COVID-19) 12/20/2019   History of marijuana use    Hypertension    Hypertensive retinopathy    IDA (iron deficiency anemia)    Mitral stenosis    a.) TEE 04/10/2020 --> mild. b.) TTE 07/04/2020 --> EF 60-65%; mild (mean gradient 5 mmHg).   Moderate pulmonary arterial systolic hypertension ([place] 12/22/2019   Nephrolithiasis    NSTEMI (non-ST elevated myocardial infarction) [place] 07/26/2016   Open-angle glaucoma 09/11/2010   PAH (pulmonary artery hypertension) ([place]    a.) TTE 12/21/2019 --> PASP 44 mmHg.   Uterine fibroid    a.) CT 04/08/2021 --> multiple with largest measuring 7 cm.   Valvular regurgitation    a.) TTE 09/01/2010 --> trivial to mild pan-valvular. b.) TTE 12/21/2019 --> mild MR and AR; moderate TR. c.) TTE 07/04/2020 --> mild TR; trivial MR and PR.   Vestibular neuronitis     Medications:  PTA: Eliquis '5mg'$  BID (last dose 2/26 PM)  Inpatient; Heparin infusion (2/27 >>) Allergies: No AC/APT related allergies   Assessment: 72year old F with PMH of A-fib on  Eliquis, cholelithiasis, bladder cancer, HTN, CAD and hepatitis presenting with RUQ pain, nausea and vomiting. Possibility of perc chole drain placement. Last dose apixaban 2/26 PM. Pharmacy will start heparin while we are holding apixaban. CHADSVASc 4. Will need to use aPTT for monitoring. Will not order baseline HL.   Date Time aPTT/HL Rate/Comment 2/27 1947 109 / ---- SUPRAtherapeutic / 750 > 700 u/hr 2/28     0454    136/> 1.10     SUPRATherapeutic  2/29     0050      104               SUPRAtherapeutic/ 650 > 600  Goal of Therapy:  Heparin level 0.3-0.7 units/ml aPTT 66-102 seconds Monitor platelets by anticoagulation protocol: Yes   Plan: 2/29:  aPTT @ 0050 = 104, slightly elevated - Will decease heparin drip to 600 units/hr and recheck aPTT and HL 8 hrs after rate change  Titrate by aPTT's until lab correlation is noted, then titrate by anti-xa alone. Continue to monitor H&H and  platelets daily while on heparin gtt.  Teyla Skidgel D, PharmD, BCPS 10/07/2022,1:27 AM

## 2022-10-08 ENCOUNTER — Ambulatory Visit: Payer: 59 | Admitting: Oncology

## 2022-10-08 ENCOUNTER — Other Ambulatory Visit: Payer: 59

## 2022-10-08 ENCOUNTER — Other Ambulatory Visit: Payer: Self-pay | Admitting: Oncology

## 2022-10-08 DIAGNOSIS — I9589 Other hypotension: Secondary | ICD-10-CM | POA: Diagnosis not present

## 2022-10-08 DIAGNOSIS — I330 Acute and subacute infective endocarditis: Secondary | ICD-10-CM | POA: Diagnosis not present

## 2022-10-08 DIAGNOSIS — R6520 Severe sepsis without septic shock: Secondary | ICD-10-CM | POA: Diagnosis not present

## 2022-10-08 DIAGNOSIS — E860 Dehydration: Secondary | ICD-10-CM | POA: Insufficient documentation

## 2022-10-08 DIAGNOSIS — A419 Sepsis, unspecified organism: Secondary | ICD-10-CM | POA: Diagnosis not present

## 2022-10-08 DIAGNOSIS — I4891 Unspecified atrial fibrillation: Secondary | ICD-10-CM | POA: Diagnosis not present

## 2022-10-08 DIAGNOSIS — R7881 Bacteremia: Secondary | ICD-10-CM | POA: Diagnosis not present

## 2022-10-08 DIAGNOSIS — C679 Malignant neoplasm of bladder, unspecified: Secondary | ICD-10-CM | POA: Diagnosis not present

## 2022-10-08 DIAGNOSIS — K819 Cholecystitis, unspecified: Secondary | ICD-10-CM | POA: Diagnosis not present

## 2022-10-08 LAB — COMPREHENSIVE METABOLIC PANEL
ALT: 15 U/L (ref 0–44)
AST: 40 U/L (ref 15–41)
Albumin: 1.5 g/dL — ABNORMAL LOW (ref 3.5–5.0)
Alkaline Phosphatase: 93 U/L (ref 38–126)
Anion gap: 9 (ref 5–15)
BUN: 20 mg/dL (ref 8–23)
CO2: 21 mmol/L — ABNORMAL LOW (ref 22–32)
Calcium: 7.6 mg/dL — ABNORMAL LOW (ref 8.9–10.3)
Chloride: 104 mmol/L (ref 98–111)
Creatinine, Ser: 1.11 mg/dL — ABNORMAL HIGH (ref 0.44–1.00)
GFR, Estimated: 53 mL/min — ABNORMAL LOW (ref >60)
Glucose, Bld: 88 mg/dL (ref 70–99)
Potassium: 3.3 mmol/L — ABNORMAL LOW (ref 3.5–5.1)
Sodium: 134 mmol/L — ABNORMAL LOW (ref 135–145)
Total Bilirubin: 1.4 mg/dL — ABNORMAL HIGH (ref 0.3–1.2)
Total Protein: 5.6 g/dL — ABNORMAL LOW (ref 6.5–8.1)

## 2022-10-08 LAB — VITAMIN B12: Vitamin B-12: 1153 pg/mL — ABNORMAL HIGH (ref 180–914)

## 2022-10-08 MED ORDER — SODIUM CHLORIDE 0.9 % IV SOLN: INTRAVENOUS | Status: DC

## 2022-10-08 MED ORDER — POTASSIUM CHLORIDE 10 MEQ/100ML IV SOLN
10.0000 meq | INTRAVENOUS | Status: AC
  Administered 2022-10-08 (×2): 10 meq via INTRAVENOUS
  Filled 2022-10-08 (×2): qty 100

## 2022-10-08 NOTE — Progress Notes (Signed)
Progress Note   Patient: Kristina Ellis [id number] DOB: [birth year] DOA: 10/02/2022     6 DOS: the patient was seen and examined on 10/08/2022   Brief hospital course: 72 year old F with PMH of A-fib on Eliquis, cholelithiasis, bladder cancer, HTN, CAD and hepatitis presenting with RUQ pain, nausea and vomiting.  Workup including CT abdomen and pelvis and MRCP concerning for acute calculus cholecystitis, hepatic cirrhosis and heterogeneous hepatic steatosis.  General surgery consulted and recommended percutaneous drain but IR feels patient is a poor candidate for percutaneous cholecystostomy due to cirrhosis and ascites with increased risk of bleeding and bacterial peritonitis, and recommended conservative management.  Patient is not interested in surgical intervention either.  Patient is on IV Zosyn.  General surgery following.   Blood culture with Streptococcus pneumonia.  Echocardiogram concerning for mitral valve vegetation.  ID consulted and following.   Patient is also in A-fib with RVR.  Started on amiodarone drip.   2/28.  Patient declined cholecystectomy tube with the general surgery team.  Still having abdominal pain.  Taken off amiodarone drip on metoprolol and Cardizem. 2/29.  Patient again refused cholecystectomy tube.  Will change heparin drip over to Eliquis.  Try to advance diet to see how things go. 3/1.  Patient again refused cholecystectomy tube.   Assessment and Plan: * Severe sepsis ([place]) Present on admission, with Streptococcus pneumoniae growing out of blood cultures.  Echocardiogram showed a possible vegetation on the mitral valve.  Seen by infectious disease and recommended 4 to 6 weeks of IV antibiotics (since she refused TEE).  Currently on Unasyn but likely will be switched over to Rocephin upon discharge.  Cholecystitis Patient declined cholecystectomy tube placement.  Currently on Unasyn.  General surgery following.  Not a great surgical candidate.  Try to advance  diet  Atrial fibrillation with RVR (HCC) Paroxysmal in nature.  Currently rate controlled on metoprolol and Cardizem.  Since patient refusing procedure, we placed back on Eliquis.  Hypotension Continue midodrine.  Hypokalemia Potassium 3.3 will replace IV potassium today.  Bladder cancer [facility]) Will need follow-up as outpatient.  [place] [address] as outpatient  Dehydration Creatinine up to 1.1.  Does not currently meet criteria for acute kidney injury.  Will give IV fluid hydration.  Iron deficiency anemia Continue to monitor hemoglobin especially being on blood thinners.  Last hemoglobin 10.9.  Hypoalbuminemia Albumin 1.5.  Hyponatremia Sodium 134  Liver cirrhosis (HCC) Seen on imaging.  Heart failure with mildly reduced ejection fraction (HCC) EF 45% with moderate mitral valve regurgitation        Subjective: Patient again refused percutaneous cholecystectomy drain.  She states she feels okay.  She stated she just wants to go home.  I advised that she needs IV antibiotics at home.  Physical Exam: Vitals:   10/08/22 0900 10/08/22 1000 10/08/22 1100 10/08/22 1200  BP:    132/66  Pulse: 65  62 63  Resp: '16 17 16 17  '$ Temp:   98.2 F (36.8 C)   TempSrc:   Oral   SpO2: 94%  100% 97%  Weight:      Height:       Physical Exam HENT:     Head: Normocephalic.     Mouth/Throat:     Pharynx: No oropharyngeal exudate.  Eyes:     General: Lids are normal.     Conjunctiva/sclera: Conjunctivae normal.  Cardiovascular:     Rate and Rhythm: Normal rate. Rhythm irregularly irregular.     Heart sounds: Normal  heart sounds, S1 normal and S2 normal.  Pulmonary:     Breath sounds: No decreased breath sounds, wheezing, rhonchi or rales.  Abdominal:     Palpations: Abdomen is soft.     Tenderness: There is abdominal tenderness in the epigastric area.  Musculoskeletal:     Right lower leg: No swelling.     Left lower leg: No swelling.  Skin:    General: Skin is warm.      Findings: No rash.  Neurological:     Mental Status: She is alert and oriented to person, place, and time.     Data Reviewed: Sodium 134, potassium 3.3, creatinine 1.11, albumin 1.5, total bilirubin 1.4, AST and ALT normal range. Family Communication: Updated patient's sister on the phone  Disposition: Status is: Inpatient Remains inpatient appropriate because: Continue IV antibiotics through the weekend.  Will end up needing a PICC line if she is agreeable and IV antibiotics for 4 to 6 weeks at home  Planned Discharge Destination: Home with home health    Time spent: 28 minutes  Author: Loletha Grayer, MD 10/08/2022 2:02 PM  For on call review [email or web address].

## 2022-10-08 NOTE — Progress Notes (Signed)
Occupational Therapy Treatment Patient Details Name: Kristina Ellis MRN: [id number] DOB: 05/29/1951 Today's Date: 10/08/2022   History of present illness Pt is a 72 y/o F admitted on 10/02/22 after presenting with RUQ pain, N&V. Workup concerning for acute calculus cholecystitis, hepatic cirrhosis and heterogeneous hepatic steatosis. General surgery consulted and recommended percutaneous drain but IR feels patient is a poor candidate for percutaneous cholecystostomy due to cirrhosis and ascites with increased risk of bleeding and bacterial peritonitis, and recommended conservative management. PMH: a-fib on eliquis, cholelithiasis, bladder CA, HTN, CAD, hepatitis, NSTEMI, pulmonary artery hypertension, vestibular neuronitis   OT comments  Chart reviewed, pt greeted in care of PT requesting to transfer to bsc. Poor activity tolerance noted on this date. Transfer completed with MIN-MOD A to bsc, STS with MIN A via HHA. Pt declines further therapetuic intervention and requests to remain on bsc. Edcuatino provided re: importance of mobilization and continued partiicpaton with thearpy. Discussed concerns re: assist level at home. Visitors in room report she will have assist. Discharge recommendation has been updated, if pt chooses to return home, would recommend physical assist for ADL at this time. OT will continue to follow acutely.    Recommendations for follow up therapy are one component of a multi-disciplinary discharge planning process, led by the attending physician.  Recommendations may be updated based on patient status, additional functional criteria and insurance authorization.    Follow Up Recommendations  Skilled nursing-short term rehab (<3 hours/day)     Assistance Recommended at Discharge Intermittent Supervision/Assistance  Patient can return home with the following  A little help with walking and/or transfers;A little help with bathing/dressing/bathroom;Assistance with  cooking/housework;Assist for transportation;Help with stairs or ramp for entrance   Equipment Recommendations  None recommended by OT    Recommendations for Other Services      Precautions / Restrictions Precautions Precautions: Fall Restrictions Weight Bearing Restrictions: No       Mobility Bed Mobility               General bed mobility comments: Pt in care of PT at edge of bed start of session, on bsc end of session    Transfers Overall transfer level: Needs assistance Equipment used: None Transfers: Sit to/from Stand Sit to Stand: Min assist                 Balance Overall balance assessment: Needs assistance Sitting-balance support: Feet supported, Bilateral upper extremity supported Sitting balance-Leahy Scale: Good     Standing balance support: During functional activity, Bilateral upper extremity supported, Single extremity supported Standing balance-Leahy Scale: Fair                             ADL either performed or assessed with clinical judgement   ADL Overall ADL's : Needs assistance/impaired                         Toilet Transfer: Moderate assistance   Toileting- Clothing Manipulation and Hygiene: Moderate assistance;Sit to/from stand              Extremity/Trunk Assessment              Vision       Perception     Praxis      Cognition Arousal/Alertness: Awake/alert Behavior During Therapy: WFL for tasks assessed/performed Overall Cognitive Status: Within Functional Limits for tasks assessed  Exercises Other Exercises Other Exercises: edu re: discharge recommendations    Shoulder Instructions       General Comments vital signs monitored, appear stable throughout. Pt reports she wants to sit on commode for extended period of time, tech, charge nurse notified    Pertinent Vitals/ Pain       Pain Assessment Pain Assessment:  Faces Faces Pain Scale: Hurts a little bit Pain Intervention(s): Monitored during session  Home Living                                          Prior Functioning/Environment              Frequency  Min 2X/week        Progress Toward Goals  OT Goals(current goals can now be found in the care plan section)  Progress towards OT goals: Progressing toward goals     Plan Discharge plan needs to be updated;Frequency remains appropriate    Co-evaluation                 AM-PAC OT "6 Clicks" Daily Activity     Outcome Measure   Help from another person eating meals?: None Help from another person taking care of personal grooming?: A Little Help from another person toileting, which includes using toliet, bedpan, or urinal?: A Lot Help from another person bathing (including washing, rinsing, drying)?: A Lot Help from another person to put on and taking off regular upper body clothing?: A Little Help from another person to put on and taking off regular lower body clothing?: A Lot 6 Click Score: 16    End of Session Equipment Utilized During Treatment: Rolling walker (2 wheels)  OT Visit Diagnosis: Other abnormalities of gait and mobility (R26.89);Muscle weakness (generalized) (M62.81)   Activity Tolerance Patient tolerated treatment well   Patient Left Other (comment) (on bsc, all needs met. Visitors in room. Staff notified (charge nurse and tech) of pt status)   Nurse Communication Mobility status        Time: 1353-1401 OT Time Calculation (min): 8 min  Charges: OT General Charges $OT Visit: 1 Visit OT Treatments $Self Care/Home Management : 8-22 mins Shanon Payor, OTD OTR/L  10/08/22, 4:22 PM

## 2022-10-08 NOTE — Progress Notes (Signed)
[place]  [phone] [phone]  ID: Kristina Ellis OB: 16-Jan-1951  MR#: [id number]  [id number]  Patient Care Team: [facility], [facility] as PCP - General (General Practice) Lloyd Huger, MD as Consulting Physician (Hematology and Oncology)  CHIEF COMPLAINT: Sepsis secondary to cholecystitis with possible endocarditis, stage IV bladder cancer.  INTERVAL HISTORY: Patient continues to decline any invasive interventions including TEE, percutaneous draining of gallbladder, or surgery.  She continues to have weakness and fatigue and mild abdominal pain, but otherwise feels well.  We discussed at length the need for 4 to 6 weeks of IV antibiotics.  She feels improved since admission.  REVIEW OF SYSTEMS:   Review of Systems  Constitutional:  Positive for malaise/fatigue. Negative for fever and weight loss.  Respiratory: Negative.  Negative for cough, hemoptysis and shortness of breath.   Cardiovascular: Negative.  Negative for leg swelling.  Gastrointestinal:  Positive for abdominal pain. Negative for constipation and diarrhea.  Genitourinary: Negative.  Negative for dysuria.  Musculoskeletal: Negative.  Negative for back pain.  Skin: Negative.  Negative for rash.  Neurological:  Positive for weakness. Negative for dizziness, focal weakness and headaches.  Psychiatric/Behavioral: Negative.  The patient is not nervous/anxious.     As per HPI. Otherwise, a complete review of systems is negative.  PAST MEDICAL HISTORY: Past Medical History:  Diagnosis Date   Aortic atherosclerosis ([place])    Arthritis    Atrial fibrillation ([place])    a.) s/p TEE with cardioversion on 02/04/2020. b.) on daily apixaban.   Bladder mass    a.) CT 04/08/2021 --> 3.2 cm intraluminal bladder mass.   Carotid stenosis, bilateral    a.) Doppler 07/04/2020 --> mild; 1-49% stenosis BILATERALLY.   Cholelithiasis    a.) CT 11/06/2020 --> largest  measured 4.5 cm   Chronic anticoagulation    a.) Apixaban   Common biliary duct calculus    a.) CT 04/08/2021 --> CBD dilated at 8 mm; 19m calculus within distal CBD.   Coronary artery disease involving native coronary artery of native heart without angina pectoris [id number]  Diastolic dysfunction    a.) TTE 07/04/2020 --> LVEF 60-65%; G1DD.   Hepatic cirrhosis (HCC)    Hepatic steatosis    Hepatitis 1970   History of 2019 novel coronavirus disease (COVID-19) 12/20/2019   History of marijuana use    Hypertension    Hypertensive retinopathy    IDA (iron deficiency anemia)    Mitral stenosis    a.) TEE 04/10/2020 --> mild. b.) TTE 07/04/2020 --> EF 60-65%; mild (mean gradient 5 mmHg).   Moderate pulmonary arterial systolic hypertension ([facility] 12/22/2019   Nephrolithiasis    NSTEMI (non-ST elevated myocardial infarction) ([place] 07/26/2016   Open-angle glaucoma 09/11/2010   PAH (pulmonary artery hypertension) ([place]    a.) TTE 12/21/2019 --> PASP 44 mmHg.   Uterine fibroid    a.) CT 04/08/2021 --> multiple with largest measuring 7 cm.   Valvular regurgitation    a.) TTE 09/01/2010 --> trivial to mild pan-valvular. b.) TTE 12/21/2019 --> mild MR and AR; moderate TR. c.) TTE 07/04/2020 --> mild TR; trivial MR and PR.   Vestibular neuronitis     PAST SURGICAL HISTORY: Past Surgical History:  Procedure Laterality Date   BREAST CYST ASPIRATION Left    COLONOSCOPY  2012   ERCP N/A 12/18/2021   Procedure: ENDOSCOPIC RETROGRADE CHOLANGIOPANCREATOGRAPHY (ERCP);  Surgeon: WLucilla Lame MD;  [facility];  Service: Endoscopy;  Laterality: N/A;  TEE WITH CARDIOVERSION N/A 02/04/2020   Procedure: TEE WITH CARDIOVERSION; Location: [facility]; Surgeon: Kandis Cocking, MD   TRANSURETHRAL RESECTION OF BLADDER TUMOR N/A 05/15/2021   Procedure: TRANSURETHRAL RESECTION OF BLADDER TUMOR (TURBT);  Surgeon: Billey Co, MD;  Location: [facility] ORS;  Service: Urology;  Laterality: N/A;    FAMILY  HISTORY: Family History  Problem Relation Age of Onset   Aneurysm Mother    Colon cancer Father    Lung cancer Brother    Breast cancer Neg Hx     ADVANCED DIRECTIVES (Y/N):  '@ADVDIR'$ @  HEALTH MAINTENANCE: Social History   Tobacco Use   Smoking status: Former    Packs/day: 0.10    Years: 10.00    Total pack years: 1.00    Types: Cigarettes    Passive exposure: Past   Smokeless tobacco: Never   Tobacco comments:    occasional smoker  Vaping Use   Vaping Use: Never used  Substance Use Topics   Alcohol use: Yes    Alcohol/week: 5.0 standard drinks of alcohol    Types: 5 Cans of beer per week    Comment: weekly   Drug use: Not Currently    Types: 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, 70's     Colonoscopy:  PAP:  Bone density:  Lipid panel:  Allergies  Allergen Reactions   Atenolol Other (See Comments)    bradycardia bradycardia     Current Facility-Administered Medications  Medication Dose Route Frequency Provider Last Rate Last Admin   Ampicillin-Sulbactam (UNASYN) 3 g in sodium chloride 0.9 % 100 mL IVPB  3 g Intravenous Q6H Ravishankar, Jayashree, MD 200 mL/hr at 10/08/22 0552 3 g at 10/08/22 0552   apixaban (ELIQUIS) tablet 5 mg  5 mg Oral BID Loletha Grayer, MD   5 mg at 10/08/22 0841   diltiazem (CARDIZEM) tablet 60 mg  60 mg Oral Q12H Tristan Schroeder, PA-C   60 mg at 10/08/22 [id number]   loperamide (IMODIUM) capsule 2 mg  2 mg Oral PRN Wendee Beavers T, MD   2 mg at 10/07/22 2327   metoprolol tartrate (LOPRESSOR) tablet 50 mg  50 mg Oral BID Tristan Schroeder, PA-C   50 mg at 10/08/22 0841   midodrine (PROAMATINE) tablet 5 mg  5 mg Oral TID WC Wendee Beavers T, MD   5 mg at 10/08/22 0841   morphine (PF) 2 MG/ML injection 2 mg  2 mg Intravenous Q2H PRN Gala Romney L, MD   2 mg at 10/07/22 1645   ondansetron (ZOFRAN) tablet 4 mg  4 mg Oral Q6H PRN Elwyn Reach, MD       Or   ondansetron (ZOFRAN) injection 4 mg  4 mg Intravenous Q6H PRN Gala Romney  L, MD   4 mg at 10/04/22 1049   Oral care mouth rinse  15 mL Mouth Rinse PRN Gonfa, Taye T, MD       potassium chloride 10 mEq in 100 mL IVPB  10 mEq Intravenous Q1 Hr x 2 Loletha Grayer, MD 100 mL/hr at 10/08/22 0847 10 mEq at 10/08/22 0847   Facility-Administered Medications Ordered in Other Encounters  Medication Dose Route Frequency Provider Last Rate Last Admin   heparin lock flush 100 unit/mL  500 Units Intracatheter Once PRN Lloyd Huger, MD       sodium chloride flush (NS) 0.9 % injection 10 mL  10 mL Intracatheter PRN Lloyd Huger, MD  OBJECTIVE: Vitals:   10/08/22 0800 10/08/22 0834  BP: 126/68   Pulse: 63   Resp: 17   Temp:  98.4 F (36.9 C)  SpO2: 100%      Body mass index is 24.27 kg/m.    ECOG FS:2 - Symptomatic, <50% confined to bed  General: Thin, no acute distress. Eyes: Pink conjunctiva, anicteric sclera. HEENT: Normocephalic, moist mucous membranes. Lungs: No audible wheezing or coughing. Heart: Regular rate and rhythm. Abdomen: Soft, nontender, no obvious distention. Musculoskeletal: No edema, cyanosis, or clubbing. Neuro: Alert, answering all questions appropriately. Cranial nerves grossly intact. Skin: No rashes or petechiae noted. Psych: Normal affect.   LAB RESULTS:  Lab Results  Component Value Date   NA 134 (L) 10/08/2022   K 3.3 (L) 10/08/2022   CL 104 10/08/2022   CO2 21 (L) 10/08/2022   GLUCOSE 88 10/08/2022   BUN 20 10/08/2022   CREATININE 1.11 (H) 10/08/2022   CALCIUM 7.6 (L) 10/08/2022   PROT 5.6 (L) 10/08/2022   ALBUMIN 1.5 (L) 10/08/2022   AST 40 10/08/2022   ALT 15 10/08/2022   ALKPHOS 93 10/08/2022   BILITOT 1.4 (H) 10/08/2022   GFRNONAA 53 (L) 10/08/2022   GFRAA >60 03/07/2018    Lab Results  Component Value Date   WBC 5.0 10/07/2022   NEUTROABS 8.3 (H) 10/04/2022   HGB 10.9 (L) 10/07/2022   HCT 33.1 (L) 10/07/2022   MCV 103.8 (H) 10/07/2022   PLT 197 10/07/2022     STUDIES: ECHOCARDIOGRAM  COMPLETE  Result Date: 10/04/2022    ECHOCARDIOGRAM REPORT   Patient Name:   LISAANNE SOLIS Date of Exam: 10/04/2022 Medical Rec #:  [id number]        Height:       59.0 in Accession #:    [id number]       Weight:       120.1 lb Date of Birth:  1951/06/13         BSA:          1.485 m Patient Age:    44 years         BP:           131/120 mmHg Patient Gender: F                HR:           113 bpm. Exam Location:  [facility] Procedure: 2D Echo, Cardiac Doppler and Color Doppler Indications:     Bacteremia  History:         Patient has prior history of Echocardiogram examinations, most                  recent 08/10/2021. CAD and Previous Myocardial Infarction,                  Pulmonary HTN, Arrythmias:Atrial Fibrillation;                  Signs/Symptoms:Bacteremia and Chest Pain. Bladder CA.  Sonographer:     Wenda Low Referring Phys:  [id number] Charlesetta Ivory GONFA Diagnosing Phys: Kathlyn Sacramento MD IMPRESSIONS  1. Left ventricular ejection fraction, by estimation, is 45 to 50%. The left ventricle has mildly decreased function. Left ventricular endocardial border not optimally defined to evaluate regional wall motion. There is moderate left ventricular hypertrophy. Left ventricular diastolic parameters are indeterminate.  2. Right ventricular systolic function is normal. The right ventricular size is normal. There is moderately elevated pulmonary artery systolic pressure.  3. Left atrial size was severely dilated.  4. Small vegetation on the mitral valve.  5. The mitral valve is abnormal. Moderate mitral valve regurgitation. No evidence of mitral stenosis. Severe mitral annular calcification.  6. Tricuspid valve regurgitation is moderate.  7. The aortic valve is normal in structure. Aortic valve regurgitation is trivial. Mild aortic valve stenosis. Aortic valve area, by VTI measures 1.43 cm. Aortic valve mean gradient measures 6.0 mmHg. Conclusion(s)/Recommendation(s): Findings concerning for mitral valve vegetation, would  recommend a Transesophageal Echocardiogram for clarification. FINDINGS  Left Ventricle: Left ventricular ejection fraction, by estimation, is 45 to 50%. The left ventricle has mildly decreased function. Left ventricular endocardial border not optimally defined to evaluate regional wall motion. The left ventricular internal cavity size was normal in size. There is moderate left ventricular hypertrophy. Left ventricular diastolic parameters are indeterminate. Right Ventricle: The right ventricular size is normal. No increase in right ventricular wall thickness. Right ventricular systolic function is normal. There is moderately elevated pulmonary artery systolic pressure. The tricuspid regurgitant velocity is 3.16 m/s, and with an assumed right atrial pressure of 8 mmHg, the estimated right ventricular systolic pressure is [id number] mmHg. Left Atrium: Left atrial size was severely dilated. Right Atrium: Right atrial size was normal in size. Pericardium: There is no evidence of pericardial effusion. Mitral Valve: The mitral valve is abnormal. There is moderate thickening of the mitral valve leaflet(s). There is moderate calcification of the mitral valve leaflet(s). Severe mitral annular calcification. Moderate mitral valve regurgitation. No evidence  of mitral valve stenosis. MV peak gradient, 16.2 mmHg. The mean mitral valve gradient is 7.0 mmHg. Tricuspid Valve: The tricuspid valve is normal in structure. Tricuspid valve regurgitation is moderate . No evidence of tricuspid stenosis. Aortic Valve: The aortic valve is normal in structure. Aortic valve regurgitation is trivial. Mild aortic stenosis is present. Aortic valve mean gradient measures 6.0 mmHg. Aortic valve peak gradient measures 12.8 mmHg. Aortic valve area, by VTI measures  1.43 cm. Pulmonic Valve: The pulmonic valve was normal in structure. Pulmonic valve regurgitation is trivial. No evidence of pulmonic stenosis. Aorta: The aortic root is normal in size and  structure. Venous: The inferior vena cava was not well visualized. IAS/Shunts: No atrial level shunt detected by color flow Doppler.  LEFT VENTRICLE PLAX 2D LVIDd:         3.50 cm LVIDs:         2.70 cm LV PW:         1.20 cm LV IVS:        1.30 cm LVOT diam:     1.90 cm LV SV:         39 LV SV Index:   27 LVOT Area:     2.84 cm  RIGHT VENTRICLE RV Basal diam:  3.20 cm RV Mid diam:    2.40 cm RV S prime:     13.50 cm/s LEFT ATRIUM             Index        RIGHT ATRIUM           Index LA diam:        5.00 cm 3.37 cm/m   RA Area:     14.50 cm LA Vol (A2C):   69.7 ml 46.93 ml/m  RA Volume:   37.90 ml  25.52 ml/m LA Vol (A4C):   86.6 ml 58.31 ml/m LA Biplane Vol: 79.4 ml 53.46 ml/m  AORTIC VALVE  PULMONIC VALVE AV Area (Vmax):    1.71 cm      PV Vmax:       1.01 m/s AV Area (Vmean):   1.53 cm      PV Peak grad:  4.1 mmHg AV Area (VTI):     1.43 cm AV Vmax:           179.00 cm/s AV Vmean:          112.000 cm/s AV VTI:            0.275 m AV Peak Grad:      12.8 mmHg AV Mean Grad:      6.0 mmHg LVOT Vmax:         108.00 cm/s LVOT Vmean:        60.300 cm/s LVOT VTI:          0.139 m LVOT/AV VTI ratio: 0.51  AORTA Ao Root diam: 3.20 cm Ao Asc diam:  2.70 cm MITRAL VALVE                TRICUSPID VALVE MV Area (PHT): 2.91 cm     TR Peak grad:   39.9 mmHg MV Area VTI:   1.68 cm     TR Vmax:        316.00 cm/s MV Peak grad:  16.2 mmHg MV Mean grad:  7.0 mmHg     SHUNTS MV Vmax:       2.01 m/s     Systemic VTI:  0.14 m MV Vmean:      116.0 cm/s   Systemic Diam: 1.90 cm MV Decel Time: 261 msec MV E velocity: 159.00 cm/s Kathlyn Sacramento MD Electronically signed by Kathlyn Sacramento MD Signature Date/Time: 10/04/2022/5:20:56 PM    Final    DG Chest Port 1 View  Result Date: 10/04/2022 CLINICAL DATA:  Shortness of breath. EXAM: PORTABLE CHEST 1 VIEW COMPARISON:  Chest radiograph and CT 10/02/2022 FINDINGS: The patient is rotated to the right. The cardiac silhouette is borderline enlarged. There is  extensive mitral annular calcification. Lung volumes are low with mild interstitial densities in the lung bases. No overt pulmonary edema, lobar consolidation, sizeable pleural effusion, or pneumothorax is identified. Advanced bilateral glenohumeral arthropathy is again noted. IMPRESSION: Low lung volumes with mild bibasilar opacities which could reflect atelectasis or early infection. Electronically Signed   By: Logan Bores M.D.   On: 10/04/2022 15:15   MR ABDOMEN MRCP W WO CONTAST  Result Date: 10/03/2022 CLINICAL DATA:  72 year old female with suspected biliary obstruction. EXAM: MRI ABDOMEN WITHOUT AND WITH CONTRAST (INCLUDING MRCP) TECHNIQUE: Multiplanar multisequence MR imaging of the abdomen was performed both before and after the administration of intravenous contrast. Heavily T2-weighted images of the biliary and pancreatic ducts were obtained, and three-dimensional MRCP images were rendered by post processing. CONTRAST:  75m GADAVIST GADOBUTROL 1 MMOL/ML IV SOLN COMPARISON:  Abdominal MRI 04/11/2022. Right upper quadrant abdominal ultrasound 10/03/2022. CT of the abdomen and pelvis 10/02/2022. FINDINGS: Lower chest: Small right pleural effusion lying dependently. Hepatobiliary: Diffuse but heterogeneous loss of signal intensity throughout the hepatic parenchyma on out of phase dual echo images, indicative of a background of heterogeneous hepatic steatosis. Liver has a shrunken appearance and very nodular contour, indicative of underlying cirrhosis. No definite suspicious cystic or solid hepatic lesions are confidently identified. No intra or extrahepatic biliary ductal dilatation noted on MRCP images. Common bile duct measures 6 mm in the porta hepatis. No filling defect within the common bile duct to  suggest choledocholithiasis. However, there is heterogeneous signal intensity within the lumen of the gallbladder, most notable for a large signal void which measures up to 4.5 x 3.0 cm, compatible with  a large stone. Gallbladder wall is irregularly thickened and edematous measuring up to 1 cm, with surrounding pericholecystic fluid and inflammatory changes. Pancreas: No pancreatic mass. No pancreatic ductal dilatation. No pancreatic or peripancreatic fluid collections or inflammatory changes. Spleen:  Unremarkable. Adrenals/Urinary Tract: Bilateral kidneys and bilateral adrenal glands are normal in appearance. No hydroureteronephrosis in the visualized portions of the abdomen. Stomach/Bowel: Visualized portions are unremarkable. Vascular/Lymphatic: No aneurysm identified in the visualized abdominal vasculature. No lymphadenopathy noted in the abdomen. Other: Trace volume of ascites. Inflammatory changes centered around the gallbladder in the right upper quadrant of the abdomen. Incidental imaging of the pelvis demonstrates an enlarged uterus with multiple lesions, presumably fibroids. Musculoskeletal: No aggressive appearing osseous lesions are noted in the visualized portions of the skeleton. IMPRESSION: 1. Cholelithiasis with imaging findings highly concerning for acute cholecystitis. Surgical consultation is recommended. 2. No choledocholithiasis or findings of biliary tract obstruction. 3. Hepatic cirrhosis and heterogeneous hepatic steatosis. No aggressive appearing hepatic lesion noted at this time. 4. Trace volume of ascites. 5. Small right pleural effusion lying dependently. Electronically Signed   By: Vinnie Langton M.D.   On: 10/03/2022 05:55   MR 3D Recon At Scanner  Result Date: 10/03/2022 CLINICAL DATA:  72 year old female with suspected biliary obstruction. EXAM: MRI ABDOMEN WITHOUT AND WITH CONTRAST (INCLUDING MRCP) TECHNIQUE: Multiplanar multisequence MR imaging of the abdomen was performed both before and after the administration of intravenous contrast. Heavily T2-weighted images of the biliary and pancreatic ducts were obtained, and three-dimensional MRCP images were rendered by post  processing. CONTRAST:  63m GADAVIST GADOBUTROL 1 MMOL/ML IV SOLN COMPARISON:  Abdominal MRI 04/11/2022. Right upper quadrant abdominal ultrasound 10/03/2022. CT of the abdomen and pelvis 10/02/2022. FINDINGS: Lower chest: Small right pleural effusion lying dependently. Hepatobiliary: Diffuse but heterogeneous loss of signal intensity throughout the hepatic parenchyma on out of phase dual echo images, indicative of a background of heterogeneous hepatic steatosis. Liver has a shrunken appearance and very nodular contour, indicative of underlying cirrhosis. No definite suspicious cystic or solid hepatic lesions are confidently identified. No intra or extrahepatic biliary ductal dilatation noted on MRCP images. Common bile duct measures 6 mm in the porta hepatis. No filling defect within the common bile duct to suggest choledocholithiasis. However, there is heterogeneous signal intensity within the lumen of the gallbladder, most notable for a large signal void which measures up to 4.5 x 3.0 cm, compatible with a large stone. Gallbladder wall is irregularly thickened and edematous measuring up to 1 cm, with surrounding pericholecystic fluid and inflammatory changes. Pancreas: No pancreatic mass. No pancreatic ductal dilatation. No pancreatic or peripancreatic fluid collections or inflammatory changes. Spleen:  Unremarkable. Adrenals/Urinary Tract: Bilateral kidneys and bilateral adrenal glands are normal in appearance. No hydroureteronephrosis in the visualized portions of the abdomen. Stomach/Bowel: Visualized portions are unremarkable. Vascular/Lymphatic: No aneurysm identified in the visualized abdominal vasculature. No lymphadenopathy noted in the abdomen. Other: Trace volume of ascites. Inflammatory changes centered around the gallbladder in the right upper quadrant of the abdomen. Incidental imaging of the pelvis demonstrates an enlarged uterus with multiple lesions, presumably fibroids. Musculoskeletal: No  aggressive appearing osseous lesions are noted in the visualized portions of the skeleton. IMPRESSION: 1. Cholelithiasis with imaging findings highly concerning for acute cholecystitis. Surgical consultation is recommended. 2. No choledocholithiasis or findings of biliary  tract obstruction. 3. Hepatic cirrhosis and heterogeneous hepatic steatosis. No aggressive appearing hepatic lesion noted at this time. 4. Trace volume of ascites. 5. Small right pleural effusion lying dependently. Electronically Signed   By: Vinnie Langton M.D.   On: 10/03/2022 05:55   US ABDOMEN LIMITED RUQ (LIVER/GB)  Result Date: 10/03/2022 CLINICAL DATA:  Abdominal pain. EXAM: ULTRASOUND ABDOMEN LIMITED RIGHT UPPER QUADRANT COMPARISON:  April 11, 2022 FINDINGS: Gallbladder: Large echogenic gallstones are seen within the lumen of a distended gallbladder (the largest measures approximately 3.0 cm). This is seen on the prior study. Stable gallbladder wall thickening (5.4 mm) and pericholecystic fluid are also noted. The presence or absence of a sonographic Percell Miller sign was not provided by the sonographer. Common bile duct: Diameter: 10.1 mm (measured 9.2 mm on the prior study) Liver: A 1.0 cm x 1.1 cm x 1.0 cm hypoechoic area is seen within the left lobe of the liver. No flow is noted within this region on color Doppler evaluation. The liver parenchyma is nodular in contour and diffusely increased in echogenicity. Portal vein is patent on color Doppler imaging with normal direction of blood flow towards the liver. Other: There is a mild to moderate amount of perihepatic fluid. IMPRESSION: 1. Cholelithiasis and stable gallbladder wall thickening, which may be secondary to ascites. Sequelae associated with acute cholecystitis cannot be excluded. 2. Hepatic steatosis and hepatic cirrhosis with additional findings that may represent a hepatic cyst within the left lobe. 3. Ascites. Electronically Signed   By: Virgina Norfolk M.D.   On:  10/03/2022 00:08   CT CHEST ABDOMEN PELVIS W CONTRAST  Result Date: 10/02/2022 CLINICAL DATA:  Sepsis, abdominal pain out of proportion, lactic acidosis. Bladder cancer. EXAM: CT CHEST, ABDOMEN, AND PELVIS WITH CONTRAST TECHNIQUE: Multidetector CT imaging of the chest, abdomen and pelvis was performed following the standard protocol during bolus administration of intravenous contrast. RADIATION DOSE REDUCTION: This exam was performed according to the departmental dose-optimization program which includes automated exposure control, adjustment of the mA and/or kV according to patient size and/or use of iterative reconstruction technique. CONTRAST:  190m OMNIPAQUE IOHEXOL 300 MG/ML  SOLN COMPARISON:  PET CT 08/26/2022, CT abdomen pelvis 04/11/2022 FINDINGS: CT CHEST FINDINGS Cardiovascular: Mild coronary artery calcification. Extensive calcification of the mitral valve annulus. Global cardiac size within normal limits. No pericardial effusion. Central pulmonary arteries are enlarged in keeping with changes of pulmonary arterial hypertension. Moderate atherosclerotic calcification within the thoracic aorta. No aortic aneurysm. Mediastinum/Nodes: Pathologic right paratracheal lymph node demonstrating hypermetabolism on prior PET CT examination is stable measuring 15 mm in short axis diameter at axial image # 17/2. No new pathologic thoracic adenopathy. Esophagus unremarkable. Visualized thyroid unremarkable. Lungs/Pleura: Interval development of small right pleural effusion. Interval development of trace interstitial pulmonary edema, possibly cardiogenic in nature. No pneumothorax. No central obstructing lesion. 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 Stable subacute fractures of the superior endplate of [id number] and the medial right eleventh and twelfth ribs proximal to the costotransverse junction demonstrating callus is again noted. No acute  bone abnormality. No focal lytic lesion identified. CT ABDOMEN PELVIS FINDINGS Hepatobiliary: Cholelithiasis again noted. The gallbladder is distended and a gallstone is seen impacted within the gallbladder neck, similar to prior examination. There has, however, developed increasing pericholecystic infiltration which may relate to inflammatory changes the gallbladder, as can be seen with calculus cholecystitis, or progressive ascites. Cirrhosis. No enhancing intrahepatic mass. No intra or extrahepatic biliary ductal dilation. Pancreas:  Unremarkable Spleen: Unremarkable Adrenals/Urinary Tract: The adrenal glands are unremarkable. The kidneys are normal in size and position. Mild left hydronephrosis and hydroureter to the level of the left ureterovesicular junction is stable since prior PET CT examination. No hydronephrosis on the right. No intrarenal or ureteral calculi. No enhancing intrarenal masses. The bladder is unremarkable. Stomach/Bowel: There is subtle asymmetric bowel wall thickening involving the hepatic flexure of the colon adjacent to the gallbladder suggesting an adjacent inflammatory process. The stomach, small bowel, and large bowel are otherwise unremarkable. Appendix normal. Mild ascites is present, new since prior PET CT examination. No free intraperitoneal gas. Vascular/Lymphatic: Extensive aortoiliac atherosclerotic calcification. No aortic aneurysm. Stable left periaortic adenopathy since prior PET CT examination where this demonstrate no significant metabolic activity and likely represents the residua of treated disease. No new pathologic adenopathy within the abdomen and pelvis. Reproductive: Multiple hypoenhancing masses are again seen within the uterus most in keeping with multiple uterine fibroids. The pelvic organs are otherwise unremarkable. Other: Increasing diffuse subcutaneous body wall edema in keeping with progressive anasarca. No abdominal wall hernia. Musculoskeletal: Degenerative  changes are seen within the lumbar spine. No acute bone abnormality. No lytic bone lesion. IMPRESSION: 1. Interval development of small right pleural effusion, trace interstitial pulmonary edema, mild ascites, and progressive diffuse subcutaneous body wall edema in keeping with progressive anasarca and/or cardiogenic failure. 2. Cholelithiasis with gallstone impacted within the gallbladder neck. Interval development of pericholecystic infiltration which may relate to inflammatory changes the gallbladder, as can be seen with calculus cholecystitis, or progressive ascites. Subtle inflammatory change involving the adjacent hepatic flexure of the colon, however, suggests acute cholecystitis. Correlation with liver enzymes and possible right upper quadrant sonography may be helpful for further evaluation. 3. Cirrhosis. 4. Stable mild left hydronephrosis and hydroureter to the level of the left ureterovesicular junction, possibly related to the patient's known underlying bladder malignancy. No mass lesion identified, however. 5. Stable subacute fractures of the superior endplate of 624THL anteriorly and the medial right eleventh and twelfth ribs proximal to the costotransverse junction. No acute bone abnormality identified. 6. Mild coronary artery calcification. 7.  Aortic Atherosclerosis (ICD10-I70.0). Electronically Signed   By: Fidela Salisbury M.D.   On: 10/02/2022 21:53   DG Chest Port 1 View  Result Date: 10/02/2022 CLINICAL DATA:  Sepsis EXAM: PORTABLE CHEST 1 VIEW COMPARISON:  04/11/2022 FINDINGS: Lungs are well expanded, symmetric, and clear. No pneumothorax or pleural effusion. Cardiac size within normal limits. Extensive mitral valve annular calcifications again noted. Pulmonary vascularity is normal. Osseous structures are age-appropriate. Advanced degenerative changes noted within the shoulders bilaterally. No acute bone abnormality. IMPRESSION: 1. No active disease. Electronically Signed   By: Fidela Salisbury  M.D.   On: 10/02/2022 19:38    ASSESSMENT: Sepsis secondary to cholecystitis, stage IV bladder cancer.  PLAN:    Sepsis secondary to cholecystitis/possible endocarditis: Patient continues to refuse any invasive procedures including TEE for confirmation of endocarditis or percutaneous drain of gallbladder.  While it was determined she has not a great surgical candidate, patient is refusing as well.  We discussed at length the need for patient to receive 4 to 6 weeks of IV antibiotics.  Appreciate infectious disease input.   Stage IV bladder cancer: In remission.  Patient continues with maintenance Keytruda.  Patient has follow-up in the cancer center on October 12, 2022 to discuss pursuing or delaying treatments. Hyponatremia: Mildly improved.  Patient's sodium is 34 today. Hypokalemia: Mild.  Replace electrolytes as needed. Renal insufficiency: Patient's  creatinine is trended up slightly to 1.11. Hyperbilirubinemia: Slowly improving.  Patient's bilirubin is 1.4 today.   Anemia: Chronic and.  Patient's hemoglobin is 10.9.  Will follow.  Lloyd Huger, MD   10/08/2022 9:58 AM

## 2022-10-08 NOTE — Progress Notes (Signed)
   Date of Admission:  10/02/2022      ID: Kristina Ellis is a 72 y.o. femalePrincipal Problem:   Severe sepsis ([name]) Active Problems:   Bladder cancer (HCC)   Hypotension   Elevated troponin level not due myocardial infarction   Coronary artery disease involving native coronary artery of native heart without angina pectoris   Hypokalemia   Atrial fibrillation with RVR (HCC)   Bacteremia due to Streptococcus pneumoniae   Goals of care, counseling/discussion   DNR (do not resuscitate)   Heart failure with mildly reduced ejection fraction (HCC)   Mitral valve vegetation   Cholecystitis   Liver cirrhosis (HCC)   Hyponatremia   Hypoalbuminemia   Palliative care encounter   Iron deficiency anemia   Dehydration    Subjective: Pt has abdominal pain She tried to get up and sit on the [facility] and had severe pain  Medications:   apixaban  5 mg Oral BID   diltiazem  60 mg Oral Q12H   metoprolol tartrate  50 mg Oral BID   midodrine  5 mg Oral TID WC    Objective: Vital signs in last 24 hours: Temp:  [98 F (36.7 C)-98.7 F (37.1 C)] 98 F (36.7 C) (03/01 1935) Pulse Rate:  [62-82] 63 (03/01 1935) Resp:  [12-24] 18 (03/01 1935) BP: (118-141)/(65-80) 138/73 (03/01 1935) SpO2:  [94 %-100 %] 99 % (03/01 1935)   PHYSICAL EXAM:  General: Alert, cooperative, no distress at rest , appears stated age.  Lungs:b/l air entry. Heart: irregular Abdomen: not examined as she says the pain worsens on touching Extremities: atraumatic, no cyanosis. No edema. No clubbing Skin: No rashes or lesions. Or bruising Lymph: Cervical, supraclavicular normal. Neurologic: Grossly non-focal  Lab Results Recent Labs    10/06/22 0454 10/07/22 0642 10/08/22 0539  WBC 5.2 5.0  --   HGB 9.7* 10.9*  --   HCT 29.0* 33.1*  --   NA 132*  --  134*  K 3.7  --  3.3*  CL 104  --  104  CO2 22  --  21*  BUN 17  --  20  CREATININE 1.02*  --  1.11*   Liver Panel Recent Labs    10/06/22 0454  10/08/22 0539  PROT 5.6* 5.6*  ALBUMIN 1.5* 1.5*  AST 41 40  ALT 14 15  ALKPHOS 62 93  BILITOT 1.5* 1.4*    Microbiology: 10/02/22 BC- strep pneumo 1 of 4 10/04/22 BC- NG Studies/Results: No results found.   Assessment/Plan: Strep pneumo bacteremia Echo Questions mitral valve vegetation Patient does not want TEE- so will treat her with 4-6 weeks of IV antibiotic- on discharge it will be ceftriaxone IV- she will need PICC line when ready to be discharged   Cholelithiasis and acute cholecystitis- will need Percutaneous cholecystostomy and bile sent for culture Patient has refused any sort of intervention. Consider repeating imaging Currently on unasyn     Afib on amiodarone   progressive bladder cancer on Keytruda. ? Mildly elevated bilirubin   Normal kidney function though CT scan showed mild left show hydronephrosis and hydroureter due to the bladder cancer   Discussed the management with patient,and hospitalist As per hospitalist diet being advanced-she is not ready for discharge this weekend-   ID will follow her peripherally this weekend- call if needed

## 2022-10-08 NOTE — TOC Progression Note (Signed)
Transition of Care [facility]) - Progression Note    Patient Details  Name: Kristina Ellis MRN: [id number] Date of Birth: [birth year]  [facility] CM/SW Contact  Laurena Slimmer, RN Phone Number: 10/08/2022, 1:07 PM  Clinical Narrative:    Referral accepted by [facility].         Expected Discharge Plan and Services                                               Social Determinants of Health (SDOH) Interventions SDOH Screenings   Food Insecurity: No Food Insecurity (10/03/2022)  Housing: Low Risk  (10/03/2022)  Transportation Needs: No Transportation Needs (10/03/2022)  Utilities: Not At Risk (10/03/2022)  Alcohol Screen: Low Risk  (06/18/2022)  Depression (PHQ2-9): Low Risk  (06/18/2022)  Financial Resource Strain: Low Risk  (06/18/2022)  Physical Activity: Inactive (06/18/2022)  Social Connections: Socially Isolated (06/18/2022)  Stress: No Stress Concern Present (06/18/2022)  Tobacco Use: Medium Risk (10/02/2022)    Readmission Risk Interventions     No data to display

## 2022-10-08 NOTE — Assessment & Plan Note (Signed)
Creatinine up to 1.1.  Does not currently meet criteria for acute kidney injury.  Will give IV fluid hydration.

## 2022-10-08 NOTE — Progress Notes (Signed)
Physical Therapy Treatment Patient Details Name: Kristina Ellis MRN: [id number] DOB: 11/28/1950 Today's Date: 10/08/2022   History of Present Illness Pt is a 72 y/o F admitted on 10/02/22 after presenting with RUQ pain, N&V. Workup concerning for acute calculus cholecystitis, hepatic cirrhosis and heterogeneous hepatic steatosis. General surgery consulted and recommended percutaneous drain but IR feels patient is a poor candidate for percutaneous cholecystostomy due to cirrhosis and ascites with increased risk of bleeding and bacterial peritonitis, and recommended conservative management. PMH: a-fib on eliquis, cholelithiasis, bladder CA, HTN, CAD, hepatitis, NSTEMI, pulmonary artery hypertension, vestibular neuronitis    PT Comments    Pt seen for PT tx with pt agreeable. Pt requires extensive time & effort to complete supine>sit with cuing to use bed rails. Pt limited by decreased core strength. Pt transferred STS with CGA<>min assist with pt reaching for counter with BUE & leaning posteriorly on EOB with BLE. Pt noted to be incontinent of BM but reporting needs to go further. OT in room & pt left in care of OT. Due to pt's limited OOB mobility & activity tolerance, updated d/c recommendations to STR upon d/c.    Recommendations for follow up therapy are one component of a multi-disciplinary discharge planning process, led by the attending physician.  Recommendations may be updated based on patient status, additional functional criteria and insurance authorization.  Follow Up Recommendations  Skilled nursing-short term rehab (<3 hours/day) Can patient physically be transported by private vehicle: Yes   Assistance Recommended at Discharge Intermittent Supervision/Assistance  Patient can return home with the following A little help with walking and/or transfers;A little help with bathing/dressing/bathroom;Assistance with cooking/housework;Help with stairs or ramp for entrance;Assist for  transportation   Equipment Recommendations  None recommended by PT    Recommendations for Other Services       Precautions / Restrictions Precautions Precautions: Fall Restrictions Weight Bearing Restrictions: No     Mobility  Bed Mobility Overal bed mobility: Needs Assistance Bed Mobility: Supine to Sit     Supine to sit: Supervision, Min guard     General bed mobility comments: Pt requires extra time, cuing to use bed rail, multiple attempts for successful supine>sit.    Transfers Overall transfer level: Needs assistance Equipment used: None Transfers: Sit to/from Stand Sit to Stand: Min guard                Ambulation/Gait                   Stairs             Wheelchair Mobility    Modified Rankin (Stroke Patients Only)       Balance Overall balance assessment: Needs assistance Sitting-balance support: Feet supported, Bilateral upper extremity supported Sitting balance-Leahy Scale: Good     Standing balance support: During functional activity, Bilateral upper extremity supported, Single extremity supported Standing balance-Leahy Scale: Fair                              Cognition Arousal/Alertness: Awake/alert Behavior During Therapy: WFL for tasks assessed/performed Overall Cognitive Status: Within Functional Limits for tasks assessed                                          Exercises      General Comments  Pertinent Vitals/Pain Pain Assessment Pain Assessment: Faces Faces Pain Scale: Hurts a little bit Pain Location: abdomen Pain Descriptors / Indicators: Grimacing, Guarding Pain Intervention(s): Monitored during session    Home Living                          Prior Function            PT Goals (current goals can now be found in the care plan section) Acute Rehab PT Goals Patient Stated Goal: decreased pain PT Goal Formulation: With patient Time For Goal  Achievement: 10/18/22 Potential to Achieve Goals: Good Progress towards PT goals: Progressing toward goals    Frequency    Min 2X/week      PT Plan Discharge plan needs to be updated    Co-evaluation              AM-PAC PT "6 Clicks" Mobility   Outcome Measure  Help needed turning from your back to your side while in a flat bed without using bedrails?: A Little Help needed moving from lying on your back to sitting on the side of a flat bed without using bedrails?: A Little Help needed moving to and from a bed to a chair (including a wheelchair)?: A Little Help needed standing up from a chair using your arms (e.g., wheelchair or bedside chair)?: A Little Help needed to walk in hospital room?: A Little Help needed climbing 3-5 steps with a railing? : A Lot 6 Click Score: 17    End of Session   Activity Tolerance:  (limited 2/2 toileting needs) Patient left:  (on [facility] with visitors in room, staff aware of pt's location)   PT Visit Diagnosis: Other abnormalities of gait and mobility (R26.89);Difficulty in walking, not elsewhere classified (R26.2);Muscle weakness (generalized) (M62.81)     Time: [id number] PT Time Calculation (min) (ACUTE ONLY): 8 min  Charges:  $Therapeutic Activity: 8-22 mins                     Lavone Nian, PT, DPT 10/08/22, 2:09 PM   Waunita Schooner 10/08/2022, 2:07 PM

## 2022-10-08 NOTE — Progress Notes (Signed)
[place] NOTE       Patient ID: Kristina Ellis MRN: [id number] DOB/AGE: 1951/02/20 72 y.o.  Admit date: 10/02/2022 Referring Physician Dr. Wendee Beavers  Primary Physician [facility] Primary Cardiologist Dr. Clayborn Bigness Reason for Consultation AF RVR, MV veg  HPI: Kristina Ellis is a 5yoF with a PMH of paroxysmal AF (apixaban, amiodarone) s/p TEE/DCCV 01/2020 [facility]), mod MR, PR, mod-sev TR, mild AR, liver cirrhosis, stage II bladder cancer s/p TURBT, XRT, and chemo, who presented to [facility] 10/02/22 with RUQ abdominal pain, nausea and vomiting. Febrile on admission, and CT abd/pelvis & MRCP c/f acute cholecystitis, pending possible IR cholecystostomy tube. Blood cx positive for strep pneumo bacteremia. Cardiology is consulted on hospital day 2 for assistance with her AF RVR & mitral valve veg on TTE, concerning for endocarditis.   Interval History:  - no acute events.  - remains in AF on tele, rate controlled in the 60s-70s - no chest pain, shortness of breath. Wants to sleep   Review of systems complete and found to be negative unless listed above     Past Medical History:  Diagnosis Date   Aortic atherosclerosis ([place])    Arthritis    Atrial fibrillation ([place])    a.) s/p TEE with cardioversion on 02/04/2020. b.) on daily apixaban.   Bladder mass    a.) CT 04/08/2021 --> 3.2 cm intraluminal bladder mass.   Carotid stenosis, bilateral    a.) Doppler 07/04/2020 --> mild; 1-49% stenosis BILATERALLY.   Cholelithiasis    a.) CT 11/06/2020 --> largest measured 4.5 cm   Chronic anticoagulation    a.) Apixaban   Common biliary duct calculus    a.) CT 04/08/2021 --> CBD dilated at 8 mm; 58m calculus within distal CBD.   Coronary artery disease involving native coronary artery of native heart without angina pectoris [id number]  Diastolic dysfunction    a.) TTE 07/04/2020 --> LVEF 60-65%; G1DD.   Hepatic cirrhosis (HCC)    Hepatic  steatosis    Hepatitis 1970   History of 2019 novel coronavirus disease (COVID-19) 12/20/2019   History of marijuana use    Hypertension    Hypertensive retinopathy    IDA (iron deficiency anemia)    Mitral stenosis    a.) TEE 04/10/2020 --> mild. b.) TTE 07/04/2020 --> EF 60-65%; mild (mean gradient 5 mmHg).   Moderate pulmonary arterial systolic hypertension ([facility] 12/22/2019   Nephrolithiasis    NSTEMI (non-ST elevated myocardial infarction) ([place] 07/26/2016   Open-angle glaucoma 09/11/2010   PAH (pulmonary artery hypertension) ([facility]    a.) TTE 12/21/2019 --> PASP 44 mmHg.   Uterine fibroid    a.) CT 04/08/2021 --> multiple with largest measuring 7 cm.   Valvular regurgitation    a.) TTE 09/01/2010 --> trivial to mild pan-valvular. b.) TTE 12/21/2019 --> mild MR and AR; moderate TR. c.) TTE 07/04/2020 --> mild TR; trivial MR and PR.   Vestibular neuronitis     Past Surgical History:  Procedure Laterality Date   BREAST CYST ASPIRATION Left    COLONOSCOPY  2012   ERCP N/A 12/18/2021   Procedure: ENDOSCOPIC RETROGRADE CHOLANGIOPANCREATOGRAPHY (ERCP);  Surgeon: WLucilla Lame MD;  [facility];  Service: Endoscopy;  Laterality: N/A;   TEE WITH CARDIOVERSION N/A 02/04/2020   Procedure: TEE WITH CARDIOVERSION; Location: [facility]; Surgeon: CKandis Cocking MD   TRANSURETHRAL RESECTION OF BLADDER TUMOR N/A 05/15/2021   Procedure: TRANSURETHRAL RESECTION OF BLADDER TUMOR (TURBT);  Surgeon: SNickolas Madrid  C, MD;  Location: [facility] ORS;  Service: Urology;  Laterality: N/A;    Medications Prior to Admission  Medication Sig Dispense Refill Last Dose   amiodarone (PACERONE) 200 MG tablet Take by mouth.   10/02/2022   magnesium oxide (MAG-OX) 400 MG tablet Take 1 tablet by mouth 2 (two) times daily.   Past Month   metoprolol tartrate (LOPRESSOR) 100 MG tablet Take 1 tablet by mouth 2 (two) times daily.   10/02/2022   potassium chloride SA (KLOR-CON M) 20 MEQ tablet Take 1 tablet (20 mEq total) by mouth  2 (two) times daily. 90 tablet 1 [id number]   trolamine salicylate (ASPERCREME) 10 % cream Apply 1 application topically as needed for muscle pain.   Past Month   atorvastatin (LIPITOR) 40 MG tablet Take 40 mg by mouth at bedtime. (Patient not taking: Reported on 08/31/2022)   Not Taking   diltiazem (CARDIZEM CD) 120 MG 24 hr capsule Take 1 capsule (120 mg total) by mouth daily. (Patient not taking: Reported on 08/31/2022) 30 capsule 0 Not Taking   ELIQUIS 5 MG TABS tablet Take 1 tablet (5 mg total) by mouth 2 (two) times daily. 60 tablet 1    metoprolol succinate (TOPROL-XL) 25 MG 24 hr tablet Take 1 tablet (25 mg total) by mouth 2 (two) times daily. Take with or immediately following a meal. 60 tablet 0    Multiple Vitamin (MULTIVITAMIN WITH MINERALS) TABS tablet Take 1 tablet by mouth daily. (Patient not taking: Reported on 08/31/2022) 90 tablet 0 Not Taking   oxybutynin (DITROPAN-XL) 10 MG 24 hr tablet Take 1 tablet (10 mg total) by mouth daily. (Patient not taking: Reported on 08/31/2022) 30 tablet 11 Not Taking   Social History   Socioeconomic History   Marital status: Single    Spouse name: Not on file   Number of children: 1   Years of education: Not on file   Highest education level: Not on file  Occupational History   Not on file  Tobacco Use   Smoking status: Former    Packs/day: 0.10    Years: 10.00    Total pack years: 1.00    Types: Cigarettes    Passive exposure: Past   Smokeless tobacco: Never   Tobacco comments:    occasional smoker  Vaping Use   Vaping Use: Never used  Substance and Sexual Activity   Alcohol use: Yes    Alcohol/week: 5.0 standard drinks of alcohol    Types: 5 Cans of beer per week    Comment: weekly   Drug use: Not Currently    Types: 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, 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   Sexual activity: Not on file  Other Topics Concern   Not on file  Social History Narrative   Lives with son   Social Determinants of Health   Financial Resource  Strain: Low Risk  (06/18/2022)   Overall Financial Resource Strain (CARDIA)    Difficulty of Paying Living Expenses: Not very hard  Food Insecurity: No Food Insecurity (10/03/2022)   Hunger Vital Sign    Worried About Running Out of Food in the Last Year: Never true    Ran Out of Food in the Last Year: Never true  Transportation Needs: No Transportation Needs (10/03/2022)   PRAPARE - Hydrologist (Medical): No    Lack of Transportation (Non-Medical): No  Physical Activity: Inactive (06/18/2022)   Exercise Vital Sign    Days of Exercise per  Week: 0 days    Minutes of Exercise per Session: 0 min  Stress: No Stress Concern Present (06/18/2022)   [place]    Feeling of Stress : Only a little  Social Connections: Socially Isolated (06/18/2022)   Social Connection and Isolation Panel [NHANES]    Frequency of Communication with Friends and Family: More than three times a week    Frequency of Social Gatherings with Friends and Family: More than three times a week    Attends Religious Services: Never    Marine scientist or Organizations: No    Attends Archivist Meetings: Never    Marital Status: Never married  Intimate Partner Violence: Not At Risk (10/03/2022)   Humiliation, Afraid, Rape, and Kick questionnaire    Fear of Current or Ex-Partner: No    Emotionally Abused: No    Physically Abused: No    Sexually Abused: No    Family History  Problem Relation Age of Onset   Aneurysm Mother    Colon cancer Father    Lung cancer Brother    Breast cancer Neg Hx       Intake/Output Summary (Last 24 hours) at 10/08/2022 0904 Last data filed at 10/07/2022 1207 Gross per 24 hour  Intake 720 ml  Output 50 ml  Net 670 ml     Vitals:   10/08/22 0000 10/08/22 0400 10/08/22 0800 10/08/22 0834  BP: (!) 141/80 118/65 126/68   Pulse: 70 66 63   Resp: '15 14 17   '$ Temp: 98.7 F (37.1  C) 98.1 F (36.7 C)  98.4 F (36.9 C)  TempSrc: Axillary     SpO2: 99%  100%   Weight:      Height:        PHYSICAL EXAM General: elderly thin black female, in no acute distress. Laying flat in bed. HEENT:  Normocephalic and atraumatic. Neck:  No JVD.  Lungs: Normal respiratory effort on room air. Clear bilaterally to auscultation. No wheezes, crackles, rhonchi. Heart: irregularly irregular with controlled rate . Normal S1 and S2 without gallops or murmurs.  Abdomen: Non-distended appearing.  Msk: Normal strength and tone for age. Extremities: Warm and well perfused. No clubbing, cyanosis.  No peripheral edema.  Neuro: Alert and oriented X 3. Psych:  Answers questions appropriately.   Labs: Basic Metabolic Panel: Recent Labs    10/06/22 0454 10/08/22 0539  NA 132* 134*  K 3.7 3.3*  CL 104 104  CO2 22 21*  GLUCOSE 90 88  BUN 17 20  CREATININE 1.02* 1.11*  CALCIUM 7.6* 7.6*  MG 1.8  --   PHOS 2.6  --     Liver Function Tests: Recent Labs    10/06/22 0454 10/08/22 0539  AST 41 40  ALT 14 15  ALKPHOS 62 93  BILITOT 1.5* 1.4*  PROT 5.6* 5.6*  ALBUMIN 1.5* 1.5*    No results for input(s): "LIPASE", "AMYLASE" in the last 72 hours.  CBC: Recent Labs    10/06/22 0454 10/07/22 0642  WBC 5.2 5.0  HGB 9.7* 10.9*  HCT 29.0* 33.1*  MCV 103.9* 103.8*  PLT 196 197    Cardiac Enzymes: No results for input(s): "CKTOTAL", "CKMB", "CKMBINDEX", "TROPONINIHS" in the last 72 hours.  BNP: No results for input(s): "BNP" in the last 72 hours.  D-Dimer: No results for input(s): "DDIMER" in the last 72 hours. Hemoglobin A1C: No results for input(s): "HGBA1C" in the last 72 hours. Fasting  Lipid Panel: No results for input(s): "CHOL", "HDL", "LDLCALC", "TRIG", "CHOLHDL", "LDLDIRECT" in the last 72 hours. Thyroid Function Tests: No results for input(s): "TSH", "T4TOTAL", "T3FREE", "THYROIDAB" in the last 72 hours.  Invalid input(s): "FREET3"  Anemia Panel: Recent  Labs    10/07/22 0642  FERRITIN 92      Radiology: ECHOCARDIOGRAM COMPLETE  Result Date: 10/04/2022    ECHOCARDIOGRAM REPORT   Patient Name:   Kristina Ellis Date of Exam: 10/04/2022 Medical Rec #:  [id number]        Height:       59.0 in Accession #:    [id number]       Weight:       120.1 lb Date of Birth:  Apr 07, 1951         BSA:          1.485 m Patient Age:    56 years         BP:           131/120 mmHg Patient Gender: F                HR:           113 bpm. Exam Location:  [facility] Procedure: 2D Echo, Cardiac Doppler and Color Doppler Indications:     Bacteremia  History:         Patient has prior history of Echocardiogram examinations, most                  recent 08/10/2021. CAD and Previous Myocardial Infarction,                  Pulmonary HTN, Arrythmias:Atrial Fibrillation;                  Signs/Symptoms:Bacteremia and Chest Pain. Bladder CA.  Sonographer:     Wenda Low Referring Phys:  [id number] Charlesetta Ivory GONFA Diagnosing Phys: Kathlyn Sacramento MD IMPRESSIONS  1. Left ventricular ejection fraction, by estimation, is 45 to 50%. The left ventricle has mildly decreased function. Left ventricular endocardial border not optimally defined to evaluate regional wall motion. There is moderate left ventricular hypertrophy. Left ventricular diastolic parameters are indeterminate.  2. Right ventricular systolic function is normal. The right ventricular size is normal. There is moderately elevated pulmonary artery systolic pressure.  3. Left atrial size was severely dilated.  4. Small vegetation on the mitral valve.  5. The mitral valve is abnormal. Moderate mitral valve regurgitation. No evidence of mitral stenosis. Severe mitral annular calcification.  6. Tricuspid valve regurgitation is moderate.  7. The aortic valve is normal in structure. Aortic valve regurgitation is trivial. Mild aortic valve stenosis. Aortic valve area, by VTI measures 1.43 cm. Aortic valve mean gradient measures 6.0 mmHg.  Conclusion(s)/Recommendation(s): Findings concerning for mitral valve vegetation, would recommend a Transesophageal Echocardiogram for clarification. FINDINGS  Left Ventricle: Left ventricular ejection fraction, by estimation, is 45 to 50%. The left ventricle has mildly decreased function. Left ventricular endocardial border not optimally defined to evaluate regional wall motion. The left ventricular internal cavity size was normal in size. There is moderate left ventricular hypertrophy. Left ventricular diastolic parameters are indeterminate. Right Ventricle: The right ventricular size is normal. No increase in right ventricular wall thickness. Right ventricular systolic function is normal. There is moderately elevated pulmonary artery systolic pressure. The tricuspid regurgitant velocity is 3.16 m/s, and with an assumed right atrial pressure of 8 mmHg, the estimated right ventricular systolic pressure is [id number] mmHg. Left Atrium:  Left atrial size was severely dilated. Right Atrium: Right atrial size was normal in size. Pericardium: There is no evidence of pericardial effusion. Mitral Valve: The mitral valve is abnormal. There is moderate thickening of the mitral valve leaflet(s). There is moderate calcification of the mitral valve leaflet(s). Severe mitral annular calcification. Moderate mitral valve regurgitation. No evidence  of mitral valve stenosis. MV peak gradient, 16.2 mmHg. The mean mitral valve gradient is 7.0 mmHg. Tricuspid Valve: The tricuspid valve is normal in structure. Tricuspid valve regurgitation is moderate . No evidence of tricuspid stenosis. Aortic Valve: The aortic valve is normal in structure. Aortic valve regurgitation is trivial. Mild aortic stenosis is present. Aortic valve mean gradient measures 6.0 mmHg. Aortic valve peak gradient measures 12.8 mmHg. Aortic valve area, by VTI measures  1.43 cm. Pulmonic Valve: The pulmonic valve was normal in structure. Pulmonic valve regurgitation is  trivial. No evidence of pulmonic stenosis. Aorta: The aortic root is normal in size and structure. Venous: The inferior vena cava was not well visualized. IAS/Shunts: No atrial level shunt detected by color flow Doppler.  LEFT VENTRICLE PLAX 2D LVIDd:         3.50 cm LVIDs:         2.70 cm LV PW:         1.20 cm LV IVS:        1.30 cm LVOT diam:     1.90 cm LV SV:         39 LV SV Index:   27 LVOT Area:     2.84 cm  RIGHT VENTRICLE RV Basal diam:  3.20 cm RV Mid diam:    2.40 cm RV S prime:     13.50 cm/s LEFT ATRIUM             Index        RIGHT ATRIUM           Index LA diam:        5.00 cm 3.37 cm/m   RA Area:     14.50 cm LA Vol (A2C):   69.7 ml 46.93 ml/m  RA Volume:   37.90 ml  25.52 ml/m LA Vol (A4C):   86.6 ml 58.31 ml/m LA Biplane Vol: 79.4 ml 53.46 ml/m  AORTIC VALVE                     PULMONIC VALVE AV Area (Vmax):    1.71 cm      PV Vmax:       1.01 m/s AV Area (Vmean):   1.53 cm      PV Peak grad:  4.1 mmHg AV Area (VTI):     1.43 cm AV Vmax:           179.00 cm/s AV Vmean:          112.000 cm/s AV VTI:            0.275 m AV Peak Grad:      12.8 mmHg AV Mean Grad:      6.0 mmHg LVOT Vmax:         108.00 cm/s LVOT Vmean:        60.300 cm/s LVOT VTI:          0.139 m LVOT/AV VTI ratio: 0.51  AORTA Ao Root diam: 3.20 cm Ao Asc diam:  2.70 cm MITRAL VALVE                TRICUSPID VALVE MV Area (  PHT): 2.91 cm     TR Peak grad:   39.9 mmHg MV Area VTI:   1.68 cm     TR Vmax:        316.00 cm/s MV Peak grad:  16.2 mmHg MV Mean grad:  7.0 mmHg     SHUNTS MV Vmax:       2.01 m/s     Systemic VTI:  0.14 m MV Vmean:      116.0 cm/s   Systemic Diam: 1.90 cm MV Decel Time: 261 msec MV E velocity: 159.00 cm/s Kathlyn Sacramento MD Electronically signed by Kathlyn Sacramento MD Signature Date/Time: 10/04/2022/5:20:56 PM    Final    DG Chest Port 1 View  Result Date: 10/04/2022 CLINICAL DATA:  Shortness of breath. EXAM: PORTABLE CHEST 1 VIEW COMPARISON:  Chest radiograph and CT 10/02/2022 FINDINGS: The patient  is rotated to the right. The cardiac silhouette is borderline enlarged. There is extensive mitral annular calcification. Lung volumes are low with mild interstitial densities in the lung bases. No overt pulmonary edema, lobar consolidation, sizeable pleural effusion, or pneumothorax is identified. Advanced bilateral glenohumeral arthropathy is again noted. IMPRESSION: Low lung volumes with mild bibasilar opacities which could reflect atelectasis or early infection. Electronically Signed   By: Logan Bores M.D.   On: 10/04/2022 15:15   MR ABDOMEN MRCP W WO CONTAST  Result Date: 10/03/2022 CLINICAL DATA:  72 year old female with suspected biliary obstruction. EXAM: MRI ABDOMEN WITHOUT AND WITH CONTRAST (INCLUDING MRCP) TECHNIQUE: Multiplanar multisequence MR imaging of the abdomen was performed both before and after the administration of intravenous contrast. Heavily T2-weighted images of the biliary and pancreatic ducts were obtained, and three-dimensional MRCP images were rendered by post processing. CONTRAST:  84m GADAVIST GADOBUTROL 1 MMOL/ML IV SOLN COMPARISON:  Abdominal MRI 04/11/2022. Right upper quadrant abdominal ultrasound 10/03/2022. CT of the abdomen and pelvis 10/02/2022. FINDINGS: Lower chest: Small right pleural effusion lying dependently. Hepatobiliary: Diffuse but heterogeneous loss of signal intensity throughout the hepatic parenchyma on out of phase dual echo images, indicative of a background of heterogeneous hepatic steatosis. Liver has a shrunken appearance and very nodular contour, indicative of underlying cirrhosis. No definite suspicious cystic or solid hepatic lesions are confidently identified. No intra or extrahepatic biliary ductal dilatation noted on MRCP images. Common bile duct measures 6 mm in the porta hepatis. No filling defect within the common bile duct to suggest choledocholithiasis. However, there is heterogeneous signal intensity within the lumen of the gallbladder, most  notable for a large signal void which measures up to 4.5 x 3.0 cm, compatible with a large stone. Gallbladder wall is irregularly thickened and edematous measuring up to 1 cm, with surrounding pericholecystic fluid and inflammatory changes. Pancreas: No pancreatic mass. No pancreatic ductal dilatation. No pancreatic or peripancreatic fluid collections or inflammatory changes. Spleen:  Unremarkable. Adrenals/Urinary Tract: Bilateral kidneys and bilateral adrenal glands are normal in appearance. No hydroureteronephrosis in the visualized portions of the abdomen. Stomach/Bowel: Visualized portions are unremarkable. Vascular/Lymphatic: No aneurysm identified in the visualized abdominal vasculature. No lymphadenopathy noted in the abdomen. Other: Trace volume of ascites. Inflammatory changes centered around the gallbladder in the right upper quadrant of the abdomen. Incidental imaging of the pelvis demonstrates an enlarged uterus with multiple lesions, presumably fibroids. Musculoskeletal: No aggressive appearing osseous lesions are noted in the visualized portions of the skeleton. IMPRESSION: 1. Cholelithiasis with imaging findings highly concerning for acute cholecystitis. Surgical consultation is recommended. 2. No choledocholithiasis or findings of biliary tract obstruction. 3.  Hepatic cirrhosis and heterogeneous hepatic steatosis. No aggressive appearing hepatic lesion noted at this time. 4. Trace volume of ascites. 5. Small right pleural effusion lying dependently. Electronically Signed   By: Vinnie Langton M.D.   On: 10/03/2022 05:55   MR 3D Recon At Scanner  Result Date: 10/03/2022 CLINICAL DATA:  72 year old female with suspected biliary obstruction. EXAM: MRI ABDOMEN WITHOUT AND WITH CONTRAST (INCLUDING MRCP) TECHNIQUE: Multiplanar multisequence MR imaging of the abdomen was performed both before and after the administration of intravenous contrast. Heavily T2-weighted images of the biliary and pancreatic  ducts were obtained, and three-dimensional MRCP images were rendered by post processing. CONTRAST:  71m GADAVIST GADOBUTROL 1 MMOL/ML IV SOLN COMPARISON:  Abdominal MRI 04/11/2022. Right upper quadrant abdominal ultrasound 10/03/2022. CT of the abdomen and pelvis 10/02/2022. FINDINGS: Lower chest: Small right pleural effusion lying dependently. Hepatobiliary: Diffuse but heterogeneous loss of signal intensity throughout the hepatic parenchyma on out of phase dual echo images, indicative of a background of heterogeneous hepatic steatosis. Liver has a shrunken appearance and very nodular contour, indicative of underlying cirrhosis. No definite suspicious cystic or solid hepatic lesions are confidently identified. No intra or extrahepatic biliary ductal dilatation noted on MRCP images. Common bile duct measures 6 mm in the porta hepatis. No filling defect within the common bile duct to suggest choledocholithiasis. However, there is heterogeneous signal intensity within the lumen of the gallbladder, most notable for a large signal void which measures up to 4.5 x 3.0 cm, compatible with a large stone. Gallbladder wall is irregularly thickened and edematous measuring up to 1 cm, with surrounding pericholecystic fluid and inflammatory changes. Pancreas: No pancreatic mass. No pancreatic ductal dilatation. No pancreatic or peripancreatic fluid collections or inflammatory changes. Spleen:  Unremarkable. Adrenals/Urinary Tract: Bilateral kidneys and bilateral adrenal glands are normal in appearance. No hydroureteronephrosis in the visualized portions of the abdomen. Stomach/Bowel: Visualized portions are unremarkable. Vascular/Lymphatic: No aneurysm identified in the visualized abdominal vasculature. No lymphadenopathy noted in the abdomen. Other: Trace volume of ascites. Inflammatory changes centered around the gallbladder in the right upper quadrant of the abdomen. Incidental imaging of the pelvis demonstrates an enlarged  uterus with multiple lesions, presumably fibroids. Musculoskeletal: No aggressive appearing osseous lesions are noted in the visualized portions of the skeleton. IMPRESSION: 1. Cholelithiasis with imaging findings highly concerning for acute cholecystitis. Surgical consultation is recommended. 2. No choledocholithiasis or findings of biliary tract obstruction. 3. Hepatic cirrhosis and heterogeneous hepatic steatosis. No aggressive appearing hepatic lesion noted at this time. 4. Trace volume of ascites. 5. Small right pleural effusion lying dependently. Electronically Signed   By: DVinnie LangtonM.D.   On: 10/03/2022 05:55   [place]DOMEN LIMITED RUQ (LIVER/GB)  Result Date: 10/03/2022 CLINICAL DATA:  Abdominal pain. EXAM: ULTRASOUND ABDOMEN LIMITED RIGHT UPPER QUADRANT COMPARISON:  April 11, 2022 FINDINGS: Gallbladder: Large echogenic gallstones are seen within the lumen of a distended gallbladder (the largest measures approximately 3.0 cm). This is seen on the prior study. Stable gallbladder wall thickening (5.4 mm) and pericholecystic fluid are also noted. The presence or absence of a sonographic MPercell Millersign was not provided by the sonographer. Common bile duct: Diameter: 10.1 mm (measured 9.2 mm on the prior study) Liver: A 1.0 cm x 1.1 cm x 1.0 cm hypoechoic area is seen within the left lobe of the liver. No flow is noted within this region on color Doppler evaluation. The liver parenchyma is nodular in contour and diffusely increased in echogenicity. Portal vein is patent on  color Doppler imaging with normal direction of blood flow towards the liver. Other: There is a mild to moderate amount of perihepatic fluid. IMPRESSION: 1. Cholelithiasis and stable gallbladder wall thickening, which may be secondary to ascites. Sequelae associated with acute cholecystitis cannot be excluded. 2. Hepatic steatosis and hepatic cirrhosis with additional findings that may represent a hepatic cyst within the left lobe. 3.  Ascites. Electronically Signed   By: Virgina Norfolk M.D.   On: 10/03/2022 00:08   CT CHEST ABDOMEN PELVIS W CONTRAST  Result Date: 10/02/2022 CLINICAL DATA:  Sepsis, abdominal pain out of proportion, lactic acidosis. Bladder cancer. EXAM: CT CHEST, ABDOMEN, AND PELVIS WITH CONTRAST TECHNIQUE: Multidetector CT imaging of the chest, abdomen and pelvis was performed following the standard protocol during bolus administration of intravenous contrast. RADIATION DOSE REDUCTION: This exam was performed according to the departmental dose-optimization program which includes automated exposure control, adjustment of the mA and/or kV according to patient size and/or use of iterative reconstruction technique. CONTRAST:  177m OMNIPAQUE IOHEXOL 300 MG/ML  SOLN COMPARISON:  PET CT 08/26/2022, CT abdomen pelvis 04/11/2022 FINDINGS: CT CHEST FINDINGS Cardiovascular: Mild coronary artery calcification. Extensive calcification of the mitral valve annulus. Global cardiac size within normal limits. No pericardial effusion. Central pulmonary arteries are enlarged in keeping with changes of pulmonary arterial hypertension. Moderate atherosclerotic calcification within the thoracic aorta. No aortic aneurysm. Mediastinum/Nodes: Pathologic right paratracheal lymph node demonstrating hypermetabolism on prior PET CT examination is stable measuring 15 mm in short axis diameter at axial image # 17/2. No new pathologic thoracic adenopathy. Esophagus unremarkable. Visualized thyroid unremarkable. Lungs/Pleura: Interval development of small right pleural effusion. Interval development of trace interstitial pulmonary edema, possibly cardiogenic in nature. No pneumothorax. No central obstructing lesion. 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 Stable subacute fractures of the superior endplate of [id number] and the medial right eleventh and twelfth ribs proximal to the  costotransverse junction demonstrating callus is again noted. No acute bone abnormality. No focal lytic lesion identified. CT ABDOMEN PELVIS FINDINGS Hepatobiliary: Cholelithiasis again noted. The gallbladder is distended and a gallstone is seen impacted within the gallbladder neck, similar to prior examination. There has, however, developed increasing pericholecystic infiltration which may relate to inflammatory changes the gallbladder, as can be seen with calculus cholecystitis, or progressive ascites. Cirrhosis. No enhancing intrahepatic mass. No intra or extrahepatic biliary ductal dilation. Pancreas: Unremarkable Spleen: Unremarkable Adrenals/Urinary Tract: The adrenal glands are unremarkable. The kidneys are normal in size and position. Mild left hydronephrosis and hydroureter to the level of the left ureterovesicular junction is stable since prior PET CT examination. No hydronephrosis on the right. No intrarenal or ureteral calculi. No enhancing intrarenal masses. The bladder is unremarkable. Stomach/Bowel: There is subtle asymmetric bowel wall thickening involving the hepatic flexure of the colon adjacent to the gallbladder suggesting an adjacent inflammatory process. The stomach, small bowel, and large bowel are otherwise unremarkable. Appendix normal. Mild ascites is present, new since prior PET CT examination. No free intraperitoneal gas. Vascular/Lymphatic: Extensive aortoiliac atherosclerotic calcification. No aortic aneurysm. Stable left periaortic adenopathy since prior PET CT examination where this demonstrate no significant metabolic activity and likely represents the residua of treated disease. No new pathologic adenopathy within the abdomen and pelvis. Reproductive: Multiple hypoenhancing masses are again seen within the uterus most in keeping with multiple uterine fibroids. The pelvic organs are otherwise unremarkable. Other: Increasing diffuse subcutaneous body wall edema in keeping with  progressive anasarca. No abdominal wall hernia. Musculoskeletal:  Degenerative changes are seen within the lumbar spine. No acute bone abnormality. No lytic bone lesion. IMPRESSION: 1. Interval development of small right pleural effusion, trace interstitial pulmonary edema, mild ascites, and progressive diffuse subcutaneous body wall edema in keeping with progressive anasarca and/or cardiogenic failure. 2. Cholelithiasis with gallstone impacted within the gallbladder neck. Interval development of pericholecystic infiltration which may relate to inflammatory changes the gallbladder, as can be seen with calculus cholecystitis, or progressive ascites. Subtle inflammatory change involving the adjacent hepatic flexure of the colon, however, suggests acute cholecystitis. Correlation with liver enzymes and possible right upper quadrant sonography may be helpful for further evaluation. 3. Cirrhosis. 4. Stable mild left hydronephrosis and hydroureter to the level of the left ureterovesicular junction, possibly related to the patient's known underlying bladder malignancy. No mass lesion identified, however. 5. Stable subacute fractures of the superior endplate of 624THL anteriorly and the medial right eleventh and twelfth ribs proximal to the costotransverse junction. No acute bone abnormality identified. 6. Mild coronary artery calcification. 7.  Aortic Atherosclerosis (ICD10-I70.0). Electronically Signed   By: Fidela Salisbury M.D.   On: 10/02/2022 21:53   DG Chest Port 1 View  Result Date: 10/02/2022 CLINICAL DATA:  Sepsis EXAM: PORTABLE CHEST 1 VIEW COMPARISON:  04/11/2022 FINDINGS: Lungs are well expanded, symmetric, and clear. No pneumothorax or pleural effusion. Cardiac size within normal limits. Extensive mitral valve annular calcifications again noted. Pulmonary vascularity is normal. Osseous structures are age-appropriate. Advanced degenerative changes noted within the shoulders bilaterally. No acute bone abnormality.  IMPRESSION: 1. No active disease. Electronically Signed   By: Fidela Salisbury M.D.   On: 10/02/2022 19:38    02/25/22 echo NORMAL LEFT VENTRICULAR SYSTOLIC FUNCTION   WITH MILD LVH  NORMAL RIGHT VENTRICULAR SYSTOLIC FUNCTION  NO VALVULAR STENOSIS  MODERATE MR, PR  MODERATE to SEVERE TR  MILD AR  EF 50-55%   02/22/2022 lexiscan myoview   Moderately abnormal myocardial perfusion scan there is  evidence of inferior borderline defect overall left ventricular function  is moderate to severely depressed 25 to 30% no clear evidence of  reversible ischemia this is a intermediate scan   TELEMETRY reviewed by me (LT) 10/08/2022 : AF RVR 100s-130s  EKG reviewed by me: AF LBBB 151  Data reviewed by me (LT) 10/08/2022: ed note, admission H&P, surgery and hospitalist progress note, last 24h vitals tele labs imaging I/O    Principal Problem:   Severe sepsis ([place]) Active Problems:   Bladder cancer ([place])   Hypotension   Elevated troponin level not due myocardial infarction   Coronary artery disease involving native coronary artery of native heart without angina pectoris   Atrial fibrillation with RVR ([place])   Bacteremia due to Streptococcus pneumoniae   Goals of care, counseling/discussion   DNR (do not resuscitate)   Heart failure with mildly reduced ejection fraction ([place])   Mitral valve vegetation   Cholecystitis   Liver cirrhosis ([place])   Hyponatremia   Hypoalbuminemia   Palliative care encounter   Iron deficiency anemia    ASSESSMENT AND PLAN:  Kristina Ellis is a 70yoF with a PMH of paroxysmal AF (apixaban, amiodarone) s/p TEE/DCCV 01/2020 [facility]), mod MR, PR, mod-sev TR, mild AR, liver cirrhosis, stage II bladder cancer s/p TURBT, XRT, and chemo, who presented to [facility] ED 10/02/22 with RUQ abdominal pain, nausea and vomiting. Febrile on admission, and CT abd/pelvis & MRCP c/f acute cholecystitis, pending possible IR cholecystostomy tube. Blood cx positive for strep pneumo bacteremia. Cardiology  is consulted  on hospital day 2 for assistance with her AF RVR & mitral valve veg on TTE.   # acute cholecystitis Presented with severe RUQ pain, nausea, and vomiting. MRCP with concern for acute cholecystitis for which percutaneous cholecystostomy drain was recommended, although the patient continues to refuse surgical interventions.  Remains on IV antibiotics  # strep pneumo bacteremia # MV endocarditis  On IV Zosyn.  Small vegetation seen on surface echo for which the interpreting physician recommended a TEE for further clarification the patient reports difficulty swallowing over the past several months and a sensation of food getting stuck in her throat.  She is hesitant to undergo TEE because of this, may not be a good candidate if she does have dysphagia/?stricture.  -continues to refuse TEE, ID following. Planning for extended Abx treatment.   # paroxysmal AF RVR  In the setting of acute cholecystitis, fever, and severe pain. Rate controlled in the 60s-90s.  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  EF this admission with slight reduction compared to prior from 02/2022 with EF of 50-55%. GDMT with metoprolol, limited by relative hypotension. Consider ARB, MRA if BP allows, so far hypotension has precluded this. Defer SGLT2i with hx bladder ca.   Cardiology will sign off. Please haiku with questions or re-engage if needed.    This patient's plan of care was discussed and created with Dr. Saralyn Pilar and he is in agreement.  Signed: Tristan Schroeder , PA-C 10/08/2022, 9:04 AM [facility]

## 2022-10-08 NOTE — Assessment & Plan Note (Signed)
Potassium 3.3 will replace IV potassium today.

## 2022-10-09 ENCOUNTER — Inpatient Hospital Stay: Payer: Self-pay

## 2022-10-09 DIAGNOSIS — A419 Sepsis, unspecified organism: Secondary | ICD-10-CM | POA: Diagnosis not present

## 2022-10-09 DIAGNOSIS — E876 Hypokalemia: Secondary | ICD-10-CM | POA: Diagnosis not present

## 2022-10-09 DIAGNOSIS — I4891 Unspecified atrial fibrillation: Secondary | ICD-10-CM | POA: Diagnosis not present

## 2022-10-09 DIAGNOSIS — K819 Cholecystitis, unspecified: Secondary | ICD-10-CM | POA: Diagnosis not present

## 2022-10-09 LAB — BASIC METABOLIC PANEL
Anion gap: 7 (ref 5–15)
BUN: 19 mg/dL (ref 8–23)
CO2: 20 mmol/L — ABNORMAL LOW (ref 22–32)
Calcium: 7.6 mg/dL — ABNORMAL LOW (ref 8.9–10.3)
Chloride: 104 mmol/L (ref 98–111)
Creatinine, Ser: 1.07 mg/dL — ABNORMAL HIGH (ref 0.44–1.00)
GFR, Estimated: 56 mL/min — ABNORMAL LOW (ref >60)
Glucose, Bld: 100 mg/dL — ABNORMAL HIGH (ref 70–99)
Potassium: 3.9 mmol/L (ref 3.5–5.1)
Sodium: 131 mmol/L — ABNORMAL LOW (ref 135–145)

## 2022-10-09 LAB — CULTURE, BLOOD (ROUTINE X 2)
Culture: NO GROWTH
Culture: NO GROWTH
Special Requests: ADEQUATE
Special Requests: ADEQUATE

## 2022-10-09 NOTE — Progress Notes (Signed)
Progress Note   Patient: Kristina Ellis [id number] DOB: August 30, 1950 DOA: 10/02/2022     7 DOS: the patient was seen and examined on 10/09/2022   Brief hospital course: 72 year old F with PMH of A-fib on Eliquis, cholelithiasis, bladder cancer, HTN, CAD and hepatitis presenting with RUQ pain, nausea and vomiting.  Workup including CT abdomen and pelvis and MRCP concerning for acute calculus cholecystitis, hepatic cirrhosis and heterogeneous hepatic steatosis.  General surgery consulted and recommended percutaneous drain but IR feels patient is a poor candidate for percutaneous cholecystostomy due to cirrhosis and ascites with increased risk of bleeding and bacterial peritonitis, and recommended conservative management.  Patient is not interested in surgical intervention either.  Patient is on IV Zosyn.  General surgery following.   Blood culture with Streptococcus pneumonia.  Echocardiogram concerning for mitral valve vegetation.  ID consulted and following.   Patient is also in A-fib with RVR.  Started on amiodarone drip.   2/28.  Patient declined cholecystectomy tube with the general surgery team.  Still having abdominal pain.  Taken off amiodarone drip on metoprolol and Cardizem. 2/29.  Patient again refused cholecystectomy tube.  Will change heparin drip over to Eliquis.  Try to advance diet to see how things go. 3/1.  Patient again refused cholecystectomy tube.  3/2: Vital stable.  Repeat blood cultures from 2/26 remain negative.  Assessment and Plan: * Severe sepsis ([place]) Present on admission, with Streptococcus pneumoniae growing out of blood cultures.  Echocardiogram showed a possible vegetation on the mitral valve.  Seen by infectious disease and recommended 4 to 6 weeks of IV antibiotics (since she refused TEE).  Currently on Unasyn but likely will be switched over to Rocephin upon discharge.  Cholecystitis Patient declined cholecystectomy tube placement.  Currently on Unasyn.   General surgery following.  Not a great surgical candidate.  Try to advance diet  Atrial fibrillation with RVR (HCC) Paroxysmal in nature.  Currently rate controlled on metoprolol and Cardizem.  Since patient refusing procedure, we placed back on Eliquis.  Hypotension Continue midodrine.  Hypokalemia Potassium 3.3 will replace IV potassium today.  Bladder cancer [facility]) Will need follow-up as outpatient.  [place] as outpatient  Dehydration Creatinine up to 1.1.  Does not currently meet criteria for acute kidney injury.  Will give IV fluid hydration.  Iron deficiency anemia Continue to monitor hemoglobin especially being on blood thinners.  Last hemoglobin 10.9.  Hypoalbuminemia Albumin 1.5.  Hyponatremia Sodium 134  Liver cirrhosis (HCC) Seen on imaging.  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  Continues to have some epigastric discomfort.  Continue to refuse any procedure, wants to go home  Physical Exam: Vitals:   10/09/22 0318 10/09/22 0919 10/09/22 1132 10/09/22 1245  BP: 121/67 138/64 130/82 139/76  Pulse: 63 (!) 59 73 72  Resp: '16 20 18 19  '$ Temp: 98 F (36.7 C) 98 F (36.7 C) 98.1 F (36.7 C) (!) 97.5 F (36.4 C)  TempSrc:   Oral Oral  SpO2: 98% 100% 99% 100%  Weight:      Height:       General.  Frail and malnourished elderly lady, in no acute distress. Pulmonary.  Lungs clear bilaterally, normal respiratory effort. CV.  Regular rate and rhythm, no JVD, rub or murmur. Abdomen.  Soft, nontender, nondistended, BS positive. CNS.  Alert and oriented .  No focal neurologic deficit. Extremities.  No  edema, no cyanosis, pulses intact and symmetrical. Psychiatry.  Judgment and insight appears normal.    Data Reviewed: Prior data reviewed.  Family Communication: Updated patient's sister on the phone  Disposition: Status is:  Inpatient Remains inpatient appropriate because: Continue IV antibiotics through the weekend.  Will end up needing a PICC line if she is agreeable and IV antibiotics for 4 to 6 weeks at home  Planned Discharge Destination: Home with home health  DVT prophylaxis.  Eliquis Time spent: 40 minutes  This record has been created using Systems analyst. Errors have been sought and corrected,but may not always be located. Such creation errors do not reflect on the standard of care.   Author: Lorella Nimrod, MD 10/09/2022 3:15 PM  For on call review [email or web address].

## 2022-10-09 NOTE — Progress Notes (Signed)
Spoke with Kerin Ransom RN re PICC to be placed Sunday.

## 2022-10-09 NOTE — TOC Transition Note (Signed)
Transition of Care [facility]) - CM/SW Discharge Note   Patient Details  Name: Kristina Ellis MRN: [id number] Date of Birth: [birth year]  [facility]) CM/SW Contact:  Raina Mina, [place] Phone Number: 10/09/2022, 9:42 AM   Clinical Narrative:  CSW spoke with patient who is declining SNF placement. Patient stated her plan remains the same and she wants to go home with home health. Patient stated she has family who can assist her in the home. Patient is setup with [facility] home health.           Patient Goals and CMS Choice      Discharge Placement                         Discharge Plan and Services Additional resources added to the After Visit Summary for                                       Social Determinants of Health (SDOH) Interventions SDOH Screenings   Food Insecurity: No Food Insecurity (10/03/2022)  Housing: Low Risk  (10/03/2022)  Transportation Needs: No Transportation Needs (10/03/2022)  Utilities: Not At Risk (10/03/2022)  Alcohol Screen: Low Risk  (06/18/2022)  Depression (PHQ2-9): Low Risk  (06/18/2022)  Financial Resource Strain: Low Risk  (06/18/2022)  Physical Activity: Inactive (06/18/2022)  Social Connections: Socially Isolated (06/18/2022)  Stress: No Stress Concern Present (06/18/2022)  Tobacco Use: Medium Risk (10/02/2022)     Readmission Risk Interventions     No data to display

## 2022-10-10 DIAGNOSIS — I4891 Unspecified atrial fibrillation: Secondary | ICD-10-CM | POA: Diagnosis not present

## 2022-10-10 DIAGNOSIS — K819 Cholecystitis, unspecified: Secondary | ICD-10-CM | POA: Diagnosis not present

## 2022-10-10 DIAGNOSIS — E876 Hypokalemia: Secondary | ICD-10-CM | POA: Diagnosis not present

## 2022-10-10 DIAGNOSIS — A419 Sepsis, unspecified organism: Secondary | ICD-10-CM | POA: Diagnosis not present

## 2022-10-10 NOTE — Plan of Care (Signed)

## 2022-10-10 NOTE — Progress Notes (Signed)
At bedside to place PICC line.  Pt not agreeable to PICC today.  States wants to d/w son and dr due to thought it was going to be in her wrist.Voiced concern re managing PICC line at home, usually alone, stating her son is not always there.  Stated she had the expectation that a nurse would be with her all the time.

## 2022-10-10 NOTE — Progress Notes (Signed)
IVT consult for PIV:  At bedside to explain to patient importance of IV access.Patient understood.Refused earlier attempts for PICC nurses to obtain picc, stated she wants picc placement tomorrow. Assessed bilateral extremities for iv using usn and unable to find vein to access at this time. Patient stated she will wait for tomorrow for picc placement. Unit RN notified.

## 2022-10-10 NOTE — Progress Notes (Signed)
Progress Note   Patient: Kristina Ellis [id number] DOB: Nov 05, 1950 DOA: 10/02/2022     8 DOS: the patient was seen and examined on 10/10/2022   Brief hospital course: 72 year old F with PMH of A-fib on Eliquis, cholelithiasis, bladder cancer, HTN, CAD and hepatitis presenting with RUQ pain, nausea and vomiting.  Workup including CT abdomen and pelvis and MRCP concerning for acute calculus cholecystitis, hepatic cirrhosis and heterogeneous hepatic steatosis.  General surgery consulted and recommended percutaneous drain but IR feels patient is a poor candidate for percutaneous cholecystostomy due to cirrhosis and ascites with increased risk of bleeding and bacterial peritonitis, and recommended conservative management.  Patient is not interested in surgical intervention either.  Patient is on IV Zosyn.  General surgery following.   Blood culture with Streptococcus pneumonia.  Echocardiogram concerning for mitral valve vegetation.  ID consulted and following.   Patient is also in A-fib with RVR.  Started on amiodarone drip.   2/28.  Patient declined cholecystectomy tube with the general surgery team.  Still having abdominal pain.  Taken off amiodarone drip on metoprolol and Cardizem. 2/29.  Patient again refused cholecystectomy tube.  Will change heparin drip over to Eliquis.  Try to advance diet to see how things go. 3/1.  Patient again refused cholecystectomy tube.  3/2: Vital stable.  Repeat blood cultures from 2/26 remain negative.  Continue to refuse any procedures.  PICC line ordered. 3/3: Remained stable.  Still awaiting PICC line placement and hopefully discharge tomorrow after arranging outpatient antibiotics.  Assessment and Plan: * Severe sepsis ([place]) Present on admission, with Streptococcus pneumoniae growing out of blood cultures.  Echocardiogram showed a possible vegetation on the mitral valve.  Seen by infectious disease and recommended 4 to 6 weeks of IV antibiotics (since she  refused TEE).  Currently on Unasyn but likely will be switched over to Rocephin upon discharge.  Cholecystitis Patient declined cholecystectomy tube placement.  Currently on Unasyn.  General surgery following.  Not a great surgical candidate.  Try to advance diet  Atrial fibrillation with RVR (HCC) Paroxysmal in nature.  Currently rate controlled on metoprolol and Cardizem.  Since patient refusing procedure, we placed back on Eliquis.  Hypotension Continue midodrine.  Hypokalemia Potassium 3.3 will replace IV potassium today.  Bladder cancer [facility]) Will need follow-up as outpatient.  [place] as outpatient  Dehydration Creatinine up to 1.1.  Does not currently meet criteria for acute kidney injury.  Will give IV fluid hydration.  Iron deficiency anemia Continue to monitor hemoglobin especially being on blood thinners.  Last hemoglobin 10.9.  Hypoalbuminemia Albumin 1.5.  Hyponatremia Sodium 134  Liver cirrhosis (HCC) Seen on imaging.  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  Still no PICC line.  No new complaints.  Physical Exam: Vitals:   10/09/22 2048 10/09/22 2347 10/10/22 0522 10/10/22 0942  BP: (!) 154/73 131/79 125/72 131/78  Pulse: 77 71 66 75  Resp: '18 16 18 16  '$ Temp: 98 F (36.7 C) 98.2 F (36.8 C) 98.8 F (37.1 C) (!) 97.5 F (36.4 C)  TempSrc: Oral     SpO2: 98% 99% 99% 100%  Weight:      Height:       General.  Malnourished elderly lady, in no acute distress. Pulmonary.  Lungs clear bilaterally, normal respiratory effort. CV.  Regular rate and rhythm, no JVD, rub or murmur. Abdomen.  Soft, nontender,  nondistended, BS positive. CNS.  Alert and oriented .  No focal neurologic deficit. Extremities.  No edema, no cyanosis, pulses intact and symmetrical. Psychiatry.  Judgment and insight appears normal. .    Data  Reviewed: Prior data reviewed.  Family Communication: Discussed with son on phone.  Disposition: Status is: Inpatient Remains inpatient appropriate because: Continue IV antibiotics through the weekend.  Will end up needing a PICC line if she is agreeable and IV antibiotics for 4 to 6 weeks at home  Planned Discharge Destination: Home with home health  DVT prophylaxis.  Eliquis Time spent: 39 minutes  This record has been created using Systems analyst. Errors have been sought and corrected,but may not always be located. Such creation errors do not reflect on the standard of care.   Author: Lorella Nimrod, MD 10/10/2022 3:52 PM  For on call review [email or web address].

## 2022-10-11 ENCOUNTER — Inpatient Hospital Stay: Payer: 59

## 2022-10-11 ENCOUNTER — Other Ambulatory Visit: Payer: Self-pay | Admitting: Oncology

## 2022-10-11 DIAGNOSIS — I330 Acute and subacute infective endocarditis: Secondary | ICD-10-CM | POA: Diagnosis not present

## 2022-10-11 DIAGNOSIS — K819 Cholecystitis, unspecified: Secondary | ICD-10-CM | POA: Diagnosis not present

## 2022-10-11 DIAGNOSIS — A419 Sepsis, unspecified organism: Secondary | ICD-10-CM | POA: Diagnosis not present

## 2022-10-11 DIAGNOSIS — E876 Hypokalemia: Secondary | ICD-10-CM | POA: Diagnosis not present

## 2022-10-11 DIAGNOSIS — I4891 Unspecified atrial fibrillation: Secondary | ICD-10-CM | POA: Diagnosis not present

## 2022-10-11 DIAGNOSIS — R7881 Bacteremia: Secondary | ICD-10-CM | POA: Diagnosis not present

## 2022-10-11 MED ORDER — ACETAMINOPHEN 500 MG PO TABS
1000.0000 mg | ORAL_TABLET | Freq: Four times a day (QID) | ORAL | Status: DC | PRN
  Administered 2022-10-11: 1000 mg via ORAL
  Filled 2022-10-11: qty 2

## 2022-10-11 MED ORDER — CEFAZOLIN SODIUM-DEXTROSE 2-4 GM/100ML-% IV SOLN
2.0000 g | Freq: Three times a day (TID) | INTRAVENOUS | Status: DC
  Administered 2022-10-12 (×2): 2 g via INTRAVENOUS
  Filled 2022-10-11 (×2): qty 100

## 2022-10-11 MED ORDER — CHLORHEXIDINE GLUCONATE CLOTH 2 % EX PADS
6.0000 | MEDICATED_PAD | Freq: Every day | CUTANEOUS | Status: DC
  Administered 2022-10-11 – 2022-10-12 (×2): 6 via TOPICAL

## 2022-10-11 MED ORDER — SODIUM CHLORIDE 0.9% FLUSH: 10.0000 mL | INTRAVENOUS | Status: DC | PRN

## 2022-10-11 MED ORDER — SODIUM CHLORIDE 0.9% FLUSH
10.0000 mL | Freq: Two times a day (BID) | INTRAVENOUS | Status: DC
  Administered 2022-10-11 – 2022-10-12 (×3): 10 mL

## 2022-10-11 NOTE — Treatment Plan (Cosign Needed Addendum)
Diagnosis: Streptococcus pneumoniae bacteremia, endocarditis of mitral valve, acute cholecystitis and cholelithiasis Bladder carcinoma  Baseline Creatinine 1.11    Allergies  Allergen Reactions   Atenolol Other (See Comments)    bradycardia bradycardia     OPAT Orders Discharge antibiotics: Cefazolin 2 grams iV every 8 hrs Duration: 6 weeks End Date: 11/11/22  [facility] Care Per Protocol:  Labs weekly while on IV antibiotics: _X_ CBC with differential  _X_ CMP   _X_ Please pull PIC at completion of IV antibiotics   Fax weekly lab results  promptly to [phone]  Clinic Follow Up Appt: 11/04/22 at 10.45Am with Dr.Leshawn Houseworth   Call [phone] with any questions

## 2022-10-11 NOTE — Care Management Important Message (Signed)
Important Message  Patient Details  Name: Kristina Ellis MRN: [id number] Date of Birth: [birth year]   Medicare Important Message Given:  Yes     Loann Quill 10/11/2022, 4:16 PM

## 2022-10-11 NOTE — Progress Notes (Signed)
Progress Note   Patient: Kristina Ellis [id number] DOB: 02-04-1951 DOA: 10/02/2022     9 DOS: the patient was seen and examined on 10/11/2022   Brief hospital course: 72 year old F with PMH of A-fib on Eliquis, cholelithiasis, bladder cancer, HTN, CAD and hepatitis presenting with RUQ pain, nausea and vomiting.  Workup including CT abdomen and pelvis and MRCP concerning for acute calculus cholecystitis, hepatic cirrhosis and heterogeneous hepatic steatosis.  General surgery consulted and recommended percutaneous drain but IR feels patient is a poor candidate for percutaneous cholecystostomy due to cirrhosis and ascites with increased risk of bleeding and bacterial peritonitis, and recommended conservative management.  Patient is not interested in surgical intervention either.  Patient is on IV Zosyn.  General surgery following.   Blood culture with Streptococcus pneumonia.  Echocardiogram concerning for mitral valve vegetation.  ID consulted and following.   Patient is also in A-fib with RVR.  Started on amiodarone drip.   2/28.  Patient declined cholecystectomy tube with the general surgery team.  Still having abdominal pain.  Taken off amiodarone drip on metoprolol and Cardizem. 2/29.  Patient again refused cholecystectomy tube.  Will change heparin drip over to Eliquis.  Try to advance diet to see how things go. 3/1.  Patient again refused cholecystectomy tube.  3/2: Vital stable.  Repeat blood cultures from 2/26 remain negative.  Continue to refuse any procedures.  PICC line ordered. 3/3: Remained stable.  Still awaiting PICC line placement and hopefully discharge tomorrow after arranging outpatient antibiotics. 3/4: Hemodynamically stable.  PT is recommending SNF, patient does not want to go to rehab.  PICC line was placed.  Outpatient IV antibiotic team will teach family member, her Sister [name] is going to help and will get the training.  Most likely can go home tomorrow morning.   Oncology is also trying to arrange outpatient follow-up appointment.  Assessment and Plan: * Severe sepsis ([place]) Present on admission, with Streptococcus pneumoniae growing out of blood cultures.  Echocardiogram showed a possible vegetation on the mitral valve.  Seen by infectious disease and recommended 4 to 6 weeks of IV antibiotics (since she refused TEE).  Currently on Unasyn but likely will be switched over to Rocephin upon discharge. PICC line was placed and outpatient IV antibiotic management team will train her Sister [name] today.  Cholecystitis Patient declined cholecystectomy tube placement.  Currently on Unasyn.  General surgery following.  Not a great surgical candidate.  Try to advance diet  Atrial fibrillation with RVR (HCC) Paroxysmal in nature.  Currently rate controlled on metoprolol and Cardizem.  Since patient refusing procedure, we placed back on Eliquis.  Hypotension Continue midodrine.  Hypokalemia Potassium 3.3 will replace IV potassium today.  Bladder cancer [facility]) Will need follow-up as outpatient.  [place] as outpatient Dr. Grayland Ormond from oncology is trying to arrange follow-up within 1 week of discharge  Dehydration Creatinine up to 1.1.  Does not currently meet criteria for acute kidney injury.  Will give IV fluid hydration.  Iron deficiency anemia Continue to monitor hemoglobin especially being on blood thinners.  Last hemoglobin 10.9.  Hypoalbuminemia Albumin 1.5.  Hyponatremia Sodium 134  Liver cirrhosis (HCC) Seen on imaging.  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  No new complaints.  She does not want to go to rehab, rather she wants to go home with home health.  Physical Exam: Vitals:   10/11/22  0815 10/11/22 0900 10/11/22 1000 10/11/22 1228  BP: 116/74   134/87  Pulse: 71   70  Resp: '19 17 15 16  '$ Temp: 98  F (36.7 C)   97.8 F (36.6 C)  TempSrc:      SpO2: 100%   99%  Weight:      Height:       General.  Ill-appearing, malnourished elderly lady, in no acute distress. Pulmonary.  Lungs clear bilaterally, normal respiratory effort. CV.  Regular rate and rhythm, no JVD, rub or murmur. Abdomen.  Soft, nontender, nondistended, BS positive. CNS.  Alert and oriented .  No focal neurologic deficit. Extremities.  No edema, no cyanosis, pulses intact and symmetrical. Psychiatry.  Judgment and insight appears normal. . .    Data Reviewed: Prior data reviewed.  Family Communication: Discussed with son and sister on phone.  Disposition: Status is: Inpatient Remains inpatient appropriate because: Continue IV antibiotics through the weekend.  Will end up needing a PICC line if she is agreeable and IV antibiotics for 4 to 6 weeks at home  Planned Discharge Destination: Home with home health  DVT prophylaxis.  Eliquis Time spent: 38 minutes  This record has been created using Systems analyst. Errors have been sought and corrected,but may not always be located. Such creation errors do not reflect on the standard of care.   Author: Lorella Nimrod, MD 10/11/2022 12:52 PM  For on call review [email or web address].

## 2022-10-11 NOTE — Progress Notes (Signed)
OT Cancellation Note  Patient Details Name: Kristina Ellis MRN: [id number] DOB: [birth year]   Cancelled Treatment:    Reason Eval/Treat Not Completed: Patient declines OT services this date. She states she is "too tired," that she has already "moved around some today," and also states that she has just had a new IV placed in her arm and that her arm is now feeling "stiff and sore." Will attempt OT at a later time/date, as pt is available and willing to participate.  Josiah Lobo 10/11/2022, 1:50 PM

## 2022-10-11 NOTE — Progress Notes (Signed)
Physical Therapy Treatment Patient Details Name: Kristina Ellis MRN: [id number] DOB: 10-Nov-1950 Today's Date: 10/11/2022   History of Present Illness Kristina Ellis is a 72 y/o F admitted on 10/02/22 after presenting with RUQ pain, N&V. Workup concerning for acute calculus cholecystitis, hepatic cirrhosis and heterogeneous hepatic steatosis. General surgery consulted and recommended percutaneous drain but IR feels patient is a poor candidate for percutaneous cholecystostomy due to cirrhosis and ascites with increased risk of bleeding and bacterial peritonitis, and recommended conservative management. PMH: a-fib on eliquis, cholelithiasis, bladder CA, HTN, CAD, hepatitis, NSTEMI, pulmonary artery hypertension, vestibular neuritis.    PT Comments    Pt asleep in bed on entry, reluctantly agreeable to participate, either has poor insight into current limitations or is not willing to be openly vulnerable about her current state. She denies any acute loss of strength/mobility, openly remarks on perceived lack of value in being mobile while admitted and/or working physical therapy. Pt able to come to EOB with generally good strength but appears immediately dizzy at EOB, supported by initial remarks. Pt able to rise to standing without assist, but is immediately halted by unsteadiness and assuming increased or recurrent dizziness. Pt's first AMB attempt falls short in distance, limited by self limiting concern of safety and emergent need to return to sitting. 2nd attempt achieves full distance to door and back. Gait is precarious, in desperate need of assistive device to thwart ataxia and recurrent LOB. With both AMB attempts, gait grows increasingly ataxic and poorly tolerated, major losses of balance sustained both times that require author to intervene for righting. Pt has a brief decrease in alertness after both AMB intervals. Pt assisted back in to bed, sat up for breakfast. Pt not agreeable to OOB to chair.  Session mostly limited by pt's unwillingness to frankly discuss or acknowledge her current limitations, she expresses confidence in her ability to resume mobility at normal upon discharge, unclear if denial or folly, however Pryor Curia has significant concerns about pt sustaining injury or harm should she have inadequate care at discharge. STR recommendation remains appropriate at this time, both from a rehabilitative standpoint as well as the need for ADL assistance.    Recommendations for follow up therapy are one component of a multi-disciplinary discharge planning process, led by the attending physician.  Recommendations may be updated based on patient status, additional functional criteria and insurance authorization.  Follow Up Recommendations  Skilled nursing-short term rehab (<3 hours/day) Can patient physically be transported by private vehicle: No   Assistance Recommended at Discharge Intermittent Supervision/Assistance  Patient can return home with the following A little help with bathing/dressing/bathroom;Assistance with cooking/housework;Help with stairs or ramp for entrance;Assist for transportation;A lot of help with walking and/or transfers;Direct supervision/assist for medications management   Equipment Recommendations  None recommended by PT (defer to receiving facility)    Recommendations for Other Services       Precautions / Restrictions Precautions Precautions: Fall Restrictions Weight Bearing Restrictions: No     Mobility  Bed Mobility Overal bed mobility: Needs Assistance Bed Mobility: Supine to Sit, Sit to Supine     Supine to sit: Min guard Sit to supine: Min assist        Transfers Overall transfer level: Needs assistance Equipment used: None Transfers: Sit to/from Stand Sit to Stand: Min guard           General transfer comment: refuses RW recommendation, relies on fixed objects for external stabilization.    Ambulation/Gait Ambulation/Gait  assistance: Min assist, Min  guard Gait Distance (Feet): 40 Feet Assistive device: None (refuses RW recommendation, relies on fixed objects for external stabilization.) Gait Pattern/deviations: Ataxic, Staggering left, Staggering right, Drifts right/left       General Gait Details: progressively ataxic and weak, presyncopal appearing with both walking intervals, although pt is reluctant to report any symptoms or limitations.   Stairs             Wheelchair Mobility    Modified Rankin (Stroke Patients Only)       Balance                                            Cognition Arousal/Alertness: Awake/alert Behavior During Therapy: WFL for tasks assessed/performed Overall Cognitive Status: No family/caregiver present to determine baseline cognitive functioning                                          Exercises Other Exercises Other Exercises: AMB to counter and back to CL EOB, then AMB to door and back to EOB. Pt has fast progression in ataxia, lightheadedness, requiries minA both times due to LOB sustained while AMB back to bed.    General Comments        Pertinent Vitals/Pain Pain Assessment Pain Assessment: No/denies pain    Home Living                          Prior Function            PT Goals (current goals can now be found in the care plan section) Acute Rehab PT Goals Patient Stated Goal: decreased pain PT Goal Formulation: With patient Time For Goal Achievement: 10/18/22 Potential to Achieve Goals: Fair Progress towards PT goals: Not progressing toward goals - comment    Frequency    Min 2X/week      PT Plan Current plan remains appropriate    Co-evaluation              AM-PAC PT "6 Clicks" Mobility   Outcome Measure  Help needed turning from your back to your side while in a flat bed without using bedrails?: A Little Help needed moving from lying on your back to sitting on the side of  a flat bed without using bedrails?: A Little Help needed moving to and from a bed to a chair (including a wheelchair)?: A Lot Help needed standing up from a chair using your arms (e.g., wheelchair or bedside chair)?: A Lot Help needed to walk in hospital room?: A Lot Help needed climbing 3-5 steps with a railing? : A Lot 6 Click Score: 14    End of Session Equipment Utilized During Treatment: Gait belt Activity Tolerance: Treatment limited secondary to medical complications (Comment) Patient left: in bed;with bed alarm set (meal tray presented)   PT Visit Diagnosis: Other abnormalities of gait and mobility (R26.89);Difficulty in walking, not elsewhere classified (R26.2);Muscle weakness (generalized) (M62.81)     Time: [id number] PT Time Calculation (min) (ACUTE ONLY): 15 min  Charges:  $Therapeutic Activity: 8-22 mins                    9:17 AM, 10/11/22 [name], PT, DPT Physical Therapist - [facility]  [phone] ([place])   Hillarie Harrigan C 10/11/2022, 9:05 AM

## 2022-10-11 NOTE — Progress Notes (Signed)
   Date of Admission:  10/02/2022      ID: Kristina Ellis is a 72 y.o. 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)   Acute bacterial endocarditis   Cholecystitis   Liver cirrhosis (HCC)   Hyponatremia   Hypoalbuminemia   Palliative care encounter   Iron deficiency anemia   Dehydration    Subjective: Pt says she is okay  Medications:   apixaban  5 mg Oral BID   Chlorhexidine Gluconate Cloth  6 each Topical Daily   diltiazem  60 mg Oral Q12H   metoprolol tartrate  50 mg Oral BID   midodrine  5 mg Oral TID WC   sodium chloride flush  10-40 mL Intracatheter Q12H    Objective: Vital signs in last 24 hours: Temp:  [97.8 F (36.6 C)-98.2 F (36.8 C)] 97.9 F (36.6 C) (03/04 1617) Pulse Rate:  [69-82] 82 (03/04 1617) Resp:  [14-30] 20 (03/04 1700) BP: (116-149)/(74-87) 149/86 (03/04 1617) SpO2:  [99 %-100 %] 99 % (03/04 1617)   PHYSICAL EXAM:  General: Alert, cooperative, no distress at rest , appears stated age.  Extremities: atraumatic, no cyanosis. No edema. No clubbing Skin: No rashes or lesions. Or bruising Lymph: Cervical, supraclavicular normal. Neurologic: Grossly non-focal  Lab Results Recent Labs    10/09/22 0556  NA 131*  K 3.9  CL 104  CO2 20*  BUN 19  CREATININE 1.07*     Microbiology: 10/02/22 BC- strep pneumo 1 of 4 10/04/22 BC- NG Studies/Results: DG Chest Port 1 View  Result Date: 10/11/2022 CLINICAL DATA:  Status post PICC placement. EXAM: PORTABLE CHEST 1 VIEW COMPARISON:  October 04, 2022. FINDINGS:  Stable cardiomediastinal silhouette. Hypoinflation of the lungs is noted with minimal bibasilar subsegmental atelectasis. Interval placement of left-sided PICC line with distal tip in expected position of the SVC. Degenerative changes seen involving the glenohumeral joints bilaterally. IMPRESSION: Hypoinflation of the lungs with minimal bibasilar subsegmental atelectasis. Interval placement of left-sided PICC line with distal tip in expected position of the SVC. Electronically Signed   By: Marijo Conception M.D.   On: 10/11/2022 11:42     Assessment/Plan: Strep pneumo bacteremia Echo Questions mitral valve vegetation Patient does not want TEE- so will treat her with 4-6 weeks of IV antibiotic- initially ceftriaxone was considered but because of gall stone and not having a perc cholecystostomy, the risk for further stones or sludge is increased with longer duration of ceftriaxone- so will do cefazolin   Cholelithiasis and acute cholecystitis- pt refused  Percutaneous cholecystostomy  Currently on unasyn. On discharge will be on cefazolin     Afib on amiodarone   progressive bladder cancer on Keytruda. ? Mildly elevated bilirubin   Normal kidney function though CT scan showed mild left show hydronephrosis and hydroureter due to the bladder cancer   Discussed the management with patient,and hospitalist Tried to reach her sister to discuss the treatment

## 2022-10-11 NOTE — Progress Notes (Signed)
Peripherally Inserted Central Catheter Placement  The IV Nurse has discussed with the patient and/or persons authorized to consent for the patient, the purpose of this procedure and the potential benefits and risks involved with this procedure.  The benefits include less needle sticks, lab draws from the catheter, and the patient may be discharged home with the catheter. Risks include, but not limited to, infection, bleeding, blood clot (thrombus formation), and puncture of an artery; nerve damage and irregular heartbeat and possibility to perform a PICC exchange if needed/ordered by physician.  Alternatives to this procedure were also discussed.  Bard Power PICC patient education guide, fact sheet on infection prevention and patient information card has been provided to patient /or left at bedside.    PICC Placement Documentation  PICC Single Lumen 10/11/22 Left Brachial 38 cm (Active)  Indication for Insertion or Continuance of Line Home intravenous therapies (PICC only) 10/11/22 1000  Exposed Catheter (cm) 0 cm 10/11/22 1000  Site Assessment Clean, Dry, Intact 10/11/22 1000  Line Status Flushed;Saline locked;Blood return noted 10/11/22 1000  Dressing Type Transparent;Securing device 10/11/22 1000  Dressing Status Antimicrobial disc in place;Clean, Dry, Intact 10/11/22 1000  Safety Lock Not Applicable [id number]  Line Care Connections checked and tightened 10/11/22 1000  Dressing Intervention New dressing 10/11/22 1000  Dressing Change Due 10/18/22 10/11/22 1000       Holley Bouche Renee 10/11/2022, 10:46 AM

## 2022-10-11 NOTE — Plan of Care (Signed)

## 2022-10-12 ENCOUNTER — Ambulatory Visit: Payer: 59 | Admitting: Oncology

## 2022-10-12 ENCOUNTER — Inpatient Hospital Stay: Payer: 59

## 2022-10-12 ENCOUNTER — Inpatient Hospital Stay: Payer: 59 | Admitting: Oncology

## 2022-10-12 ENCOUNTER — Other Ambulatory Visit: Payer: 59

## 2022-10-12 DIAGNOSIS — K7460 Unspecified cirrhosis of liver: Secondary | ICD-10-CM

## 2022-10-12 DIAGNOSIS — D509 Iron deficiency anemia, unspecified: Secondary | ICD-10-CM

## 2022-10-12 DIAGNOSIS — K819 Cholecystitis, unspecified: Secondary | ICD-10-CM | POA: Diagnosis not present

## 2022-10-12 DIAGNOSIS — I330 Acute and subacute infective endocarditis: Secondary | ICD-10-CM | POA: Diagnosis not present

## 2022-10-12 DIAGNOSIS — I4891 Unspecified atrial fibrillation: Secondary | ICD-10-CM | POA: Diagnosis not present

## 2022-10-12 DIAGNOSIS — R7881 Bacteremia: Secondary | ICD-10-CM | POA: Diagnosis not present

## 2022-10-12 MED ORDER — ONDANSETRON HCL 4 MG PO TABS: 4.0000 mg | ORAL_TABLET | Freq: Four times a day (QID) | ORAL | 0 refills | Status: DC | PRN

## 2022-10-12 MED ORDER — MIDODRINE HCL 5 MG PO TABS: 5.0000 mg | ORAL_TABLET | Freq: Three times a day (TID) | ORAL | 1 refills | Status: DC

## 2022-10-12 MED ORDER — METOPROLOL TARTRATE 50 MG PO TABS: 50.0000 mg | ORAL_TABLET | Freq: Two times a day (BID) | ORAL | 1 refills | Status: DC

## 2022-10-12 MED ORDER — CEFAZOLIN IV (FOR PTA / DISCHARGE USE ONLY): 2.0000 g | Freq: Three times a day (TID) | INTRAVENOUS | 0 refills | Status: DC

## 2022-10-12 MED ORDER — CHLORHEXIDINE GLUCONATE CLOTH 2 % EX PADS
6.0000 | MEDICATED_PAD | Freq: Every day | CUTANEOUS | Status: DC
  Administered 2022-10-12: 6 via TOPICAL

## 2022-10-12 MED ORDER — DILTIAZEM HCL 60 MG PO TABS: 60.0000 mg | ORAL_TABLET | Freq: Two times a day (BID) | ORAL | 1 refills | Status: DC

## 2022-10-12 MED ORDER — LOPERAMIDE HCL 2 MG PO CAPS: 2.0000 mg | ORAL_CAPSULE | ORAL | 0 refills | Status: DC | PRN

## 2022-10-12 NOTE — Progress Notes (Signed)
PT Cancellation Note  Patient Details Name: Kristina Ellis MRN: [id number] DOB: 10/27/1950   Cancelled Treatment:    Reason Eval/Treat Not Completed: Other (comment) (Chart reviewed, NSG consulted. Pt not interested in mobilizing or working with PT at this time. Pt enouraged to be mobilie today and AMB with staff, very unsteady and weak previous day. Will continue to follow.)  10:56 AM, 10/12/22 [name], PT, DPT Physical Therapist - [facility]  [phone] ([place])    Kristina Ellis 10/12/2022, 10:56 AM

## 2022-10-12 NOTE — Progress Notes (Signed)
PHARMACY CONSULT NOTE FOR:  OUTPATIENT  PARENTERAL ANTIBIOTIC THERAPY (OPAT)  Indication: S. Pneumoniae bacteremia, cholecystitis and possible endocarditis Regimen: Cefazolin 2gm IV q8h End date: 11/11/2022  Labs - Once weekly:  CBC/D and CMP Please pull PIC at completion of IV antibiotics  Fax weekly lab results  promptly to [phone]  IV antibiotic discharge orders are pended. To discharging provider:  please sign these orders via discharge navigator,  Select New Orders & click on the button choice - Manage This Unsigned Work.     Thank you for allowing pharmacy to be a part of this patient's care.  Doreene Eland, PharmD, BCPS, BCIDP Work Cell: [phone] 10/12/2022 8:11 AM

## 2022-10-12 NOTE — Discharge Summary (Signed)
Physician Discharge Summary   Patient: Kristina Ellis MRN: [id number] DOB: 08-Sep-1950  Admit date:     10/02/2022  Discharge date: 10/12/22  Discharge Physician: Lorella Nimrod   PCP: [facility]   Recommendations at discharge:  Please obtain weekly CBC with differential and CMP Follow-up with your oncologist for continuation of bladder cancer treatment. Patient is being discharged on IV antibiotics for concern of endocarditis until 11/11/22. 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  Workup including CT abdomen and pelvis and MRCP concerning for acute calculus cholecystitis, hepatic cirrhosis and heterogeneous hepatic steatosis.  General surgery consulted and recommended percutaneous drain but IR feels patient is a poor candidate for percutaneous cholecystostomy due to cirrhosis and ascites with increased risk of bleeding and bacterial peritonitis, and recommended conservative management.  Patient is not interested in surgical intervention either.   Patient is on IV Zosyn.  General surgery following.   Blood culture with Streptococcus pneumonia.  Echocardiogram concerning for mitral valve vegetation.  ID consulted and following.   Patient is also in A-fib with RVR.  Started on amiodarone drip.   2/28.  Patient declined cholecystectomy tube with the general surgery team.  Still having abdominal pain.  Taken off amiodarone drip on metoprolol and Cardizem. 2/29.  Patient again refused cholecystectomy tube.  Will change heparin drip over to Eliquis.  Try to advance diet to see how things go. 3/1.  Patient again refused cholecystectomy tube.  3/2: Vital stable.  Repeat blood cultures from 2/26 remain negative.  Continue to refuse any procedures.  PICC line ordered. 3/3: Remained stable.  Still awaiting PICC line placement and hopefully discharge tomorrow after arranging outpatient antibiotics. 3/4: Hemodynamically stable.  PT is recommending SNF, patient does not want to go to rehab.  PICC line was placed.  Outpatient IV antibiotic team will teach family member, her Sister [name] is going to help and will get the training.  Most likely can go home tomorrow morning.  Oncology is also trying to arrange outpatient follow-up appointment. 3/5: Patient remained stable.  ID placed orders for outpatient IV antibiotics with cefazolin 2 g every 8 hourly, and patient will continue antibiotics until 11/11/2022 and will require weekly CBC with differential and CMP.  Initially ceftriaxone was considered but due to her history of gall stone and not having a perc cholecystostomy, the risk for further stones or sludge is increased with longer duration of ceftriaxone-antibiotics switched to cefazolin.  Few changes were made to her medications.  Patient was not taking a lot of them at baseline.  Amiodarone was discontinued and she will continue with metoprolol  at a lower dose and Cardizem 60 mg twice daily.  She will continue with Eliquis.  Patient need to have a  follow-up with her oncologist continuation of bladder cancer treatment.  Patient also need to follow-up with infectious disease for further recommendations.  She will continue on current medications and need to have a close follow-up with her providers for further recommendations.  Assessment and Plan: * Severe sepsis ([place]) Present on admission, with Streptococcus pneumoniae growing out of blood cultures.  Echocardiogram showed a possible vegetation on the mitral valve.  Seen by infectious disease and recommended 4 to 6 weeks of IV antibiotics (since she refused TEE).  Currently on Unasyn but likely will be switched over to Rocephin upon discharge. PICC line was placed and outpatient IV antibiotic management team will train her Sister [name] today.  Cholecystitis Patient declined cholecystectomy tube placement.  Currently on Unasyn.  General surgery following.  Not a great surgical candidate.  Try to advance diet  Atrial fibrillation with RVR (HCC) Paroxysmal in nature.  Currently rate controlled on metoprolol and Cardizem.  Since patient refusing procedure, we placed back on Eliquis.  Hypotension Continue midodrine.  Hypokalemia Potassium 3.3 will replace IV potassium today.  Bladder cancer [facility]) Will need follow-up as outpatient.  [place] as outpatient Dr. Grayland Ormond from oncology is trying to arrange follow-up within 1 week of discharge  Dehydration Creatinine up to 1.1.  Does not currently meet criteria for acute kidney injury.  Will give IV fluid hydration.  Iron deficiency anemia Continue to monitor hemoglobin especially being on blood thinners.  Last hemoglobin 10.9.  Hypoalbuminemia Albumin 1.5.  Hyponatremia Sodium 134  Liver cirrhosis (HCC) Seen on imaging.  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  Cardiology. Procedures performed: None Disposition:  Home health Diet recommendation:  Discharge Diet Orders (From admission, onward)     Start     Ordered   10/12/22 0000  Diet - low sodium heart healthy        10/12/22 1253           Cardiac diet DISCHARGE MEDICATION: Allergies as of 10/12/2022       Reactions   Atenolol Other (See Comments)   bradycardia bradycardia        Medication List     STOP taking these medications    amiodarone 200 MG tablet Commonly known as: PACERONE   atorvastatin 40 MG tablet Commonly known as: LIPITOR   diltiazem 120 MG 24 hr capsule Commonly known as: Cardizem CD   metoprolol succinate 25 MG 24 hr tablet Commonly known as: TOPROL-XL   oxybutynin 10 MG 24 hr tablet Commonly known as: DITROPAN-XL   potassium chloride SA 20 MEQ tablet Commonly known as: KLOR-CON M       TAKE these medications    ceFAZolin  IVPB Commonly known as: ANCEF Inject 2 g into the vein every 8 (eight) hours. Indication:  S. Pneumoniae bacteremia, cholecystitis and possible endocarditis First Dose: Yes Last Day of Therapy:  11/11/2022 Labs - Once weekly:  CBC/D and CMP Please pull PIC at completion of IV antibiotics  Fax weekly lab results  promptly to [phone] Method of administration: IV Push Method of administration may be changed at the discretion of home infusion pharmacist based upon assessment of the patient and/or caregiver's ability to self-administer the medication ordered.   diltiazem 60 MG tablet Commonly known as: CARDIZEM Take 1 tablet (  60 mg total) by mouth every 12 (twelve) hours.   Eliquis 5 MG Tabs tablet Generic drug: apixaban Take 1 tablet (5 mg total) by mouth 2 (two) times daily.   loperamide 2 MG capsule Commonly known as: IMODIUM Take 1 capsule (2 mg total) by mouth as needed for diarrhea or loose stools.   magnesium oxide 400 MG tablet Commonly known as: MAG-OX Take 1 tablet by mouth 2 (two) times daily.   metoprolol tartrate 50 MG tablet Commonly known as:  LOPRESSOR Take 1 tablet (50 mg total) by mouth 2 (two) times daily. What changed:  medication strength how much to take   midodrine 5 MG tablet Commonly known as: PROAMATINE Take 1 tablet (5 mg total) by mouth 3 (three) times daily with meals.   multivitamin with minerals Tabs tablet Take 1 tablet by mouth daily.   ondansetron 4 MG tablet Commonly known as: ZOFRAN Take 1 tablet (4 mg total) by mouth every 6 (six) hours as needed for nausea.   trolamine salicylate 10 % cream Commonly known as: ASPERCREME Apply 1 application topically as needed for muscle pain.               Discharge Care Instructions  (From admission, onward)           Start     Ordered   10/12/22 0000  Change dressing on IV access line weekly and PRN  (Home infusion instructions - [facility] )        10/12/22 1253            Follow-up Information     [place]wood, Karma Greaser D, MD. Go in 1 day(s).   Specialties: Cardiology, Internal Medicine Contact information: [place] [address] [place], [place]. Schedule an appointment as soon as possible for a visit in 1 week(s).   Specialty: General Practice Contact information: [place] [address] [phone]         Tsosie Billing, MD Follow up in 1 month(s).   Specialty: Infectious Diseases Contact information: [place] [address] [phone]                Discharge Exam: Danley Danker Weights   10/02/22 1827  Weight: 54.5 kg   General.  Malnourished elderly lady, in no acute distress. Pulmonary.  Lungs clear bilaterally, normal respiratory effort. CV.  Regular rate and rhythm, no JVD, rub or murmur. Abdomen.  Soft, nontender, nondistended, BS positive. CNS.  Alert and oriented .  No focal neurologic deficit. Extremities.  No edema, no cyanosis, pulses intact and symmetrical. Psychiatry.   Judgment and insight appears normal.   Condition at discharge: stable  The results of significant diagnostics from this hospitalization (including imaging, microbiology, ancillary and laboratory) are listed below for reference.   Imaging Studies: DG Chest Port 1 View  Result Date: 10/11/2022 CLINICAL DATA:  Status post PICC placement. EXAM: PORTABLE CHEST 1 VIEW COMPARISON:  October 04, 2022. FINDINGS: Stable cardiomediastinal silhouette. Hypoinflation of the lungs is noted with minimal bibasilar subsegmental atelectasis. Interval placement of left-sided PICC line with distal tip in expected position of the SVC. Degenerative changes seen involving the glenohumeral joints bilaterally. IMPRESSION: Hypoinflation of the lungs with minimal bibasilar subsegmental atelectasis. Interval placement of left-sided PICC line with distal tip in expected position of the SVC. Electronically Signed   By: Marijo Conception M.D.   On:  10/11/2022 11:42   [place] EKG SITE RITE  Result Date: 10/09/2022 If Site Rite image not attached, placement could not be confirmed due to current cardiac rhythm.  ECHOCARDIOGRAM COMPLETE  Result Date: 10/04/2022    ECHOCARDIOGRAM REPORT   Patient Name:   DALAYZA FINNELL Date of Exam: 10/04/2022 Medical Rec #:  [id number]        Height:       59.0 in Accession #:    [id number]       Weight:       120.1 lb Date of Birth:  August 13, 1950         BSA:          1.485 m Patient Age:    72 years         BP:           131/120 mmHg Patient Gender: F                HR:           113 bpm. Exam Location:  [facility] Procedure: 2D Echo, Cardiac Doppler and Color Doppler Indications:     Bacteremia  History:         Patient has prior history of Echocardiogram examinations, most                  recent 08/10/2021. CAD and Previous Myocardial Infarction,                  Pulmonary HTN, Arrythmias:Atrial Fibrillation;                  Signs/Symptoms:Bacteremia and Chest Pain. Bladder CA.  Sonographer:     Wenda Low  Referring Phys:  [id number] Charlesetta Ivory GONFA Diagnosing Phys: Kathlyn Sacramento MD IMPRESSIONS  1. Left ventricular ejection fraction, by estimation, is 45 to 50%. The left ventricle has mildly decreased function. Left ventricular endocardial border not optimally defined to evaluate regional wall motion. There is moderate left ventricular hypertrophy. Left ventricular diastolic parameters are indeterminate.  2. Right ventricular systolic function is normal. The right ventricular size is normal. There is moderately elevated pulmonary artery systolic pressure.  3. Left atrial size was severely dilated.  4. Small vegetation on the mitral valve.  5. The mitral valve is abnormal. Moderate mitral valve regurgitation. No evidence of mitral stenosis. Severe mitral annular calcification.  6. Tricuspid valve regurgitation is moderate.  7. The aortic valve is normal in structure. Aortic valve regurgitation is trivial. Mild aortic valve stenosis. Aortic valve area, by VTI measures 1.43 cm. Aortic valve mean gradient measures 6.0 mmHg. Conclusion(s)/Recommendation(s): Findings concerning for mitral valve vegetation, would recommend a Transesophageal Echocardiogram for clarification. FINDINGS  Left Ventricle: Left ventricular ejection fraction, by estimation, is 45 to 50%. The left ventricle has mildly decreased function. Left ventricular endocardial border not optimally defined to evaluate regional wall motion. The left ventricular internal cavity size was normal in size. There is moderate left ventricular hypertrophy. Left ventricular diastolic parameters are indeterminate. Right Ventricle: The right ventricular size is normal. No increase in right ventricular wall thickness. Right ventricular systolic function is normal. There is moderately elevated pulmonary artery systolic pressure. The tricuspid regurgitant velocity is 3.16 m/s, and with an assumed right atrial pressure of 8 mmHg, the estimated right ventricular systolic pressure is  [id number] mmHg. Left Atrium: Left atrial size was severely dilated. Right Atrium: Right atrial size was normal in size. Pericardium: There is no evidence of pericardial effusion. Mitral Valve: The mitral  valve is abnormal. There is moderate thickening of the mitral valve leaflet(s). There is moderate calcification of the mitral valve leaflet(s). Severe mitral annular calcification. Moderate mitral valve regurgitation. No evidence  of mitral valve stenosis. MV peak gradient, 16.2 mmHg. The mean mitral valve gradient is 7.0 mmHg. Tricuspid Valve: The tricuspid valve is normal in structure. Tricuspid valve regurgitation is moderate . No evidence of tricuspid stenosis. Aortic Valve: The aortic valve is normal in structure. Aortic valve regurgitation is trivial. Mild aortic stenosis is present. Aortic valve mean gradient measures 6.0 mmHg. Aortic valve peak gradient measures 12.8 mmHg. Aortic valve area, by VTI measures  1.43 cm. Pulmonic Valve: The pulmonic valve was normal in structure. Pulmonic valve regurgitation is trivial. No evidence of pulmonic stenosis. Aorta: The aortic root is normal in size and structure. Venous: The inferior vena cava was not well visualized. IAS/Shunts: No atrial level shunt detected by color flow Doppler.  LEFT VENTRICLE PLAX 2D LVIDd:         3.50 cm LVIDs:         2.70 cm LV PW:         1.20 cm LV IVS:        1.30 cm LVOT diam:     1.90 cm LV SV:         39 LV SV Index:   27 LVOT Area:     2.84 cm  RIGHT VENTRICLE RV Basal diam:  3.20 cm RV Mid diam:    2.40 cm RV S prime:     13.50 cm/s LEFT ATRIUM             Index        RIGHT ATRIUM           Index LA diam:        5.00 cm 3.37 cm/m   RA Area:     14.50 cm LA Vol (A2C):   69.7 ml 46.93 ml/m  RA Volume:   37.90 ml  25.52 ml/m LA Vol (A4C):   86.6 ml 58.31 ml/m LA Biplane Vol: 79.4 ml 53.46 ml/m  AORTIC VALVE                     PULMONIC VALVE AV Area (Vmax):    1.71 cm      PV Vmax:       1.01 m/s AV Area (Vmean):   1.53 cm       PV Peak grad:  4.1 mmHg AV Area (VTI):     1.43 cm AV Vmax:           179.00 cm/s AV Vmean:          112.000 cm/s AV VTI:            0.275 m AV Peak Grad:      12.8 mmHg AV Mean Grad:      6.0 mmHg LVOT Vmax:         108.00 cm/s LVOT Vmean:        60.300 cm/s LVOT VTI:          0.139 m LVOT/AV VTI ratio: 0.51  AORTA Ao Root diam: 3.20 cm Ao Asc diam:  2.70 cm MITRAL VALVE                TRICUSPID VALVE MV Area (PHT): 2.91 cm     TR Peak grad:   39.9 mmHg MV Area VTI:   1.68 cm     TR Vmax:  316.00 cm/s MV Peak grad:  16.2 mmHg MV Mean grad:  7.0 mmHg     SHUNTS MV Vmax:       2.01 m/s     Systemic VTI:  0.14 m MV Vmean:      116.0 cm/s   Systemic Diam: 1.90 cm MV Decel Time: 261 msec MV E velocity: 159.00 cm/s Kathlyn Sacramento MD Electronically signed by Kathlyn Sacramento MD Signature Date/Time: 10/04/2022/5:20:56 PM    Final    DG Chest Port 1 View  Result Date: 10/04/2022 CLINICAL DATA:  Shortness of breath. EXAM: PORTABLE CHEST 1 VIEW COMPARISON:  Chest radiograph and CT 10/02/2022 FINDINGS: The patient is rotated to the right. The cardiac silhouette is borderline enlarged. There is extensive mitral annular calcification. Lung volumes are low with mild interstitial densities in the lung bases. No overt pulmonary edema, lobar consolidation, sizeable pleural effusion, or pneumothorax is identified. Advanced bilateral glenohumeral arthropathy is again noted. IMPRESSION: Low lung volumes with mild bibasilar opacities which could reflect atelectasis or early infection. Electronically Signed   By: Logan Bores M.D.   On: 10/04/2022 15:15   MR ABDOMEN MRCP W WO CONTAST  Result Date: 10/03/2022 CLINICAL DATA:  72 year old female with suspected biliary obstruction. EXAM: MRI ABDOMEN WITHOUT AND WITH CONTRAST (INCLUDING MRCP) TECHNIQUE: Multiplanar multisequence MR imaging of the abdomen was performed both before and after the administration of intravenous contrast. Heavily T2-weighted images of the biliary and  pancreatic ducts were obtained, and three-dimensional MRCP images were rendered by post processing. CONTRAST:  71m GADAVIST GADOBUTROL 1 MMOL/ML IV SOLN COMPARISON:  Abdominal MRI 04/11/2022. Right upper quadrant abdominal ultrasound 10/03/2022. CT of the abdomen and pelvis 10/02/2022. FINDINGS: Lower chest: Small right pleural effusion lying dependently. Hepatobiliary: Diffuse but heterogeneous loss of signal intensity throughout the hepatic parenchyma on out of phase dual echo images, indicative of a background of heterogeneous hepatic steatosis. Liver has a shrunken appearance and very nodular contour, indicative of underlying cirrhosis. No definite suspicious cystic or solid hepatic lesions are confidently identified. No intra or extrahepatic biliary ductal dilatation noted on MRCP images. Common bile duct measures 6 mm in the porta hepatis. No filling defect within the common bile duct to suggest choledocholithiasis. However, there is heterogeneous signal intensity within the lumen of the gallbladder, most notable for a large signal void which measures up to 4.5 x 3.0 cm, compatible with a large stone. Gallbladder wall is irregularly thickened and edematous measuring up to 1 cm, with surrounding pericholecystic fluid and inflammatory changes. Pancreas: No pancreatic mass. No pancreatic ductal dilatation. No pancreatic or peripancreatic fluid collections or inflammatory changes. Spleen:  Unremarkable. Adrenals/Urinary Tract: Bilateral kidneys and bilateral adrenal glands are normal in appearance. No hydroureteronephrosis in the visualized portions of the abdomen. Stomach/Bowel: Visualized portions are unremarkable. Vascular/Lymphatic: No aneurysm identified in the visualized abdominal vasculature. No lymphadenopathy noted in the abdomen. Other: Trace volume of ascites. Inflammatory changes centered around the gallbladder in the right upper quadrant of the abdomen. Incidental imaging of the pelvis demonstrates an  enlarged uterus with multiple lesions, presumably fibroids. Musculoskeletal: No aggressive appearing osseous lesions are noted in the visualized portions of the skeleton. IMPRESSION: 1. Cholelithiasis with imaging findings highly concerning for acute cholecystitis. Surgical consultation is recommended. 2. No choledocholithiasis or findings of biliary tract obstruction. 3. Hepatic cirrhosis and heterogeneous hepatic steatosis. No aggressive appearing hepatic lesion noted at this time. 4. Trace volume of ascites. 5. Small right pleural effusion lying dependently. Electronically Signed   By: DQuillian Quince  Entrikin M.D.   On: 10/03/2022 05:55   MR 3D Recon At Scanner  Result Date: 10/03/2022 CLINICAL DATA:  72 year old female with suspected biliary obstruction. EXAM: MRI ABDOMEN WITHOUT AND WITH CONTRAST (INCLUDING MRCP) TECHNIQUE: Multiplanar multisequence MR imaging of the abdomen was performed both before and after the administration of intravenous contrast. Heavily T2-weighted images of the biliary and pancreatic ducts were obtained, and three-dimensional MRCP images were rendered by post processing. CONTRAST:  42m GADAVIST GADOBUTROL 1 MMOL/ML IV SOLN COMPARISON:  Abdominal MRI 04/11/2022. Right upper quadrant abdominal ultrasound 10/03/2022. CT of the abdomen and pelvis 10/02/2022. FINDINGS: Lower chest: Small right pleural effusion lying dependently. Hepatobiliary: Diffuse but heterogeneous loss of signal intensity throughout the hepatic parenchyma on out of phase dual echo images, indicative of a background of heterogeneous hepatic steatosis. Liver has a shrunken appearance and very nodular contour, indicative of underlying cirrhosis. No definite suspicious cystic or solid hepatic lesions are confidently identified. No intra or extrahepatic biliary ductal dilatation noted on MRCP images. Common bile duct measures 6 mm in the porta hepatis. No filling defect within the common bile duct to suggest  choledocholithiasis. However, there is heterogeneous signal intensity within the lumen of the gallbladder, most notable for a large signal void which measures up to 4.5 x 3.0 cm, compatible with a large stone. Gallbladder wall is irregularly thickened and edematous measuring up to 1 cm, with surrounding pericholecystic fluid and inflammatory changes. Pancreas: No pancreatic mass. No pancreatic ductal dilatation. No pancreatic or peripancreatic fluid collections or inflammatory changes. Spleen:  Unremarkable. Adrenals/Urinary Tract: Bilateral kidneys and bilateral adrenal glands are normal in appearance. No hydroureteronephrosis in the visualized portions of the abdomen. Stomach/Bowel: Visualized portions are unremarkable. Vascular/Lymphatic: No aneurysm identified in the visualized abdominal vasculature. No lymphadenopathy noted in the abdomen. Other: Trace volume of ascites. Inflammatory changes centered around the gallbladder in the right upper quadrant of the abdomen. Incidental imaging of the pelvis demonstrates an enlarged uterus with multiple lesions, presumably fibroids. Musculoskeletal: No aggressive appearing osseous lesions are noted in the visualized portions of the skeleton. IMPRESSION: 1. Cholelithiasis with imaging findings highly concerning for acute cholecystitis. Surgical consultation is recommended. 2. No choledocholithiasis or findings of biliary tract obstruction. 3. Hepatic cirrhosis and heterogeneous hepatic steatosis. No aggressive appearing hepatic lesion noted at this time. 4. Trace volume of ascites. 5. Small right pleural effusion lying dependently. Electronically Signed   By: DVinnie LangtonM.D.   On: 10/03/2022 05:55   [place] LIMITED RUQ (LIVER/GB)  Result Date: 10/03/2022 CLINICAL DATA:  Abdominal pain. EXAM: ULTRASOUND ABDOMEN LIMITED RIGHT UPPER QUADRANT COMPARISON:  April 11, 2022 FINDINGS: Gallbladder: Large echogenic gallstones are seen within the lumen of a distended  gallbladder (the largest measures approximately 3.0 cm). This is seen on the prior study. Stable gallbladder wall thickening (5.4 mm) and pericholecystic fluid are also noted. The presence or absence of a sonographic MPercell Millersign was not provided by the sonographer. Common bile duct: Diameter: 10.1 mm (measured 9.2 mm on the prior study) Liver: A 1.0 cm x 1.1 cm x 1.0 cm hypoechoic area is seen within the left lobe of the liver. No flow is noted within this region on color Doppler evaluation. The liver parenchyma is nodular in contour and diffusely increased in echogenicity. Portal vein is patent on color Doppler imaging with normal direction of blood flow towards the liver. Other: There is a mild to moderate amount of perihepatic fluid. IMPRESSION: 1. Cholelithiasis and stable gallbladder wall thickening, which may be  secondary to ascites. Sequelae associated with acute cholecystitis cannot be excluded. 2. Hepatic steatosis and hepatic cirrhosis with additional findings that may represent a hepatic cyst within the left lobe. 3. Ascites. Electronically Signed   By: Virgina Norfolk M.D.   On: 10/03/2022 00:08   CT CHEST ABDOMEN PELVIS W CONTRAST  Result Date: 10/02/2022 CLINICAL DATA:  Sepsis, abdominal pain out of proportion, lactic acidosis. Bladder cancer. EXAM: CT CHEST, ABDOMEN, AND PELVIS WITH CONTRAST TECHNIQUE: Multidetector CT imaging of the chest, abdomen and pelvis was performed following the standard protocol during bolus administration of intravenous contrast. RADIATION DOSE REDUCTION: This exam was performed according to the departmental dose-optimization program which includes automated exposure control, adjustment of the mA and/or kV according to patient size and/or use of iterative reconstruction technique. CONTRAST:  139m OMNIPAQUE IOHEXOL 300 MG/ML  SOLN COMPARISON:  PET CT 08/26/2022, CT abdomen pelvis 04/11/2022 FINDINGS: CT CHEST FINDINGS Cardiovascular: Mild coronary artery calcification.  Extensive calcification of the mitral valve annulus. Global cardiac size within normal limits. No pericardial effusion. Central pulmonary arteries are enlarged in keeping with changes of pulmonary arterial hypertension. Moderate atherosclerotic calcification within the thoracic aorta. No aortic aneurysm. Mediastinum/Nodes: Pathologic right paratracheal lymph node demonstrating hypermetabolism on prior PET CT examination is stable measuring 15 mm in short axis diameter at axial image # 17/2. No new pathologic thoracic adenopathy. Esophagus unremarkable. Visualized thyroid unremarkable. Lungs/Pleura: Interval development of small right pleural effusion. Interval development of trace interstitial pulmonary edema, possibly cardiogenic in nature. No pneumothorax. No central obstructing lesion. 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 Stable subacute fractures of the superior endplate of [id number] and the medial right eleventh and twelfth ribs proximal to the costotransverse junction demonstrating callus is again noted. No acute bone abnormality. No focal lytic lesion identified. CT ABDOMEN PELVIS FINDINGS Hepatobiliary: Cholelithiasis again noted. The gallbladder is distended and a gallstone is seen impacted within the gallbladder neck, similar to prior examination. There has, however, developed increasing pericholecystic infiltration which may relate to inflammatory changes the gallbladder, as can be seen with calculus cholecystitis, or progressive ascites. Cirrhosis. No enhancing intrahepatic mass. No intra or extrahepatic biliary ductal dilation. Pancreas: Unremarkable Spleen: Unremarkable Adrenals/Urinary Tract: The adrenal glands are unremarkable. The kidneys are normal in size and position. Mild left hydronephrosis and hydroureter to the level of the left ureterovesicular junction is stable since prior PET CT examination. No hydronephrosis on  the right. No intrarenal or ureteral calculi. No enhancing intrarenal masses. The bladder is unremarkable. Stomach/Bowel: There is subtle asymmetric bowel wall thickening involving the hepatic flexure of the colon adjacent to the gallbladder suggesting an adjacent inflammatory process. The stomach, small bowel, and large bowel are otherwise unremarkable. Appendix normal. Mild ascites is present, new since prior PET CT examination. No free intraperitoneal gas. Vascular/Lymphatic: Extensive aortoiliac atherosclerotic calcification. No aortic aneurysm. Stable left periaortic adenopathy since prior PET CT examination where this demonstrate no significant metabolic activity and likely represents the residua of treated disease. No new pathologic adenopathy within the abdomen and pelvis. Reproductive: Multiple hypoenhancing masses are again seen within the uterus most in keeping with multiple uterine fibroids. The pelvic organs are otherwise unremarkable. Other: Increasing diffuse subcutaneous body wall edema in keeping with progressive anasarca. No abdominal wall hernia. Musculoskeletal: Degenerative changes are seen within the lumbar spine. No acute bone abnormality. No lytic bone lesion. IMPRESSION: 1. Interval development of small right pleural effusion, trace interstitial pulmonary edema, mild ascites, and progressive diffuse subcutaneous  body wall edema in keeping with progressive anasarca and/or cardiogenic failure. 2. Cholelithiasis with gallstone impacted within the gallbladder neck. Interval development of pericholecystic infiltration which may relate to inflammatory changes the gallbladder, as can be seen with calculus cholecystitis, or progressive ascites. Subtle inflammatory change involving the adjacent hepatic flexure of the colon, however, suggests acute cholecystitis. Correlation with liver enzymes and possible right upper quadrant sonography may be helpful for further evaluation. 3. Cirrhosis. 4. Stable  mild left hydronephrosis and hydroureter to the level of the left ureterovesicular junction, possibly related to the patient's known underlying bladder malignancy. No mass lesion identified, however. 5. Stable subacute fractures of the superior endplate of 624THL anteriorly and the medial right eleventh and twelfth ribs proximal to the costotransverse junction. No acute bone abnormality identified. 6. Mild coronary artery calcification. 7.  Aortic Atherosclerosis (ICD10-I70.0). Electronically Signed   By: Fidela Salisbury M.D.   On: 10/02/2022 21:53   DG Chest Port 1 View  Result Date: 10/02/2022 CLINICAL DATA:  Sepsis EXAM: PORTABLE CHEST 1 VIEW COMPARISON:  04/11/2022 FINDINGS: Lungs are well expanded, symmetric, and clear. No pneumothorax or pleural effusion. Cardiac size within normal limits. Extensive mitral valve annular calcifications again noted. Pulmonary vascularity is normal. Osseous structures are age-appropriate. Advanced degenerative changes noted within the shoulders bilaterally. No acute bone abnormality. IMPRESSION: 1. No active disease. Electronically Signed   By: Fidela Salisbury M.D.   On: 10/02/2022 19:38    Microbiology: Results for orders placed or performed during the hospital encounter of 10/02/22  Blood Culture (routine x 2)     Status: Abnormal   Collection Time: 10/02/22  6:32 PM   Specimen: BLOOD  Result Value Ref Range Status   Specimen Description   Final    BLOOD LEFT ANTECUBITAL Performed at [facility], [address], [place], [place] [address]    Special Requests   Final    BOTTLES DRAWN AEROBIC AND ANAEROBIC Blood Culture adequate volume Performed at [facility], [address] [place], [place] [address]    Culture  Setup Time   Final    Organism ID to follow [place] TO, READ BACK BY AND VERIFIED WITH: CAROLINE COULTER 10/03/22 1431 AMK Performed at [facility], [address] [place], [place] [address]    Culture STREPTOCOCCUS PNEUMONIAE (A)  Final   Report Status 10/05/2022 FINAL  Final   Organism ID, Bacteria STREPTOCOCCUS PNEUMONIAE  Final      Susceptibility   Streptococcus pneumoniae - MIC*    ERYTHROMYCIN <=0.12 SENSITIVE Sensitive     LEVOFLOXACIN 0.5 SENSITIVE Sensitive     VANCOMYCIN 0.5 SENSITIVE Sensitive     PENICILLIN (meningitis) <=0.06 SENSITIVE Sensitive     PENO - penicillin <=0.06      PENICILLIN (non-meningitis) <=0.06 SENSITIVE Sensitive     PENICILLIN (oral) <=0.06 SENSITIVE Sensitive     CEFTRIAXONE (non-meningitis) <=0.12 SENSITIVE Sensitive     CEFTRIAXONE (meningitis) <=0.12 SENSITIVE Sensitive     * STREPTOCOCCUS PNEUMONIAE  Blood Culture ID Panel (Reflexed)     Status: Abnormal   Collection Time: 10/02/22  6:32 PM  Result Value Ref Range Status   Enterococcus faecalis NOT DETECTED NOT DETECTED Final   Enterococcus Faecium NOT DETECTED NOT DETECTED Final   Listeria monocytogenes NOT DETECTED NOT DETECTED Final   Staphylococcus species NOT DETECTED NOT DETECTED Final   Staphylococcus aureus (BCID) NOT DETECTED NOT DETECTED Final   Staphylococcus epidermidis NOT DETECTED  NOT DETECTED Final   Staphylococcus lugdunensis NOT DETECTED NOT DETECTED Final   Streptococcus species DETECTED (A) NOT DETECTED Final    Comment: CRITICAL RESULT CALLED TO, READ BACK BY AND VERIFIED WITH: CAROLINE COULTER 10/03/22 1431 AMK    Streptococcus agalactiae NOT DETECTED NOT DETECTED Final   Streptococcus pneumoniae DETECTED (A) NOT DETECTED Final    Comment: CRITICAL RESULT CALLED TO, READ BACK BY AND VERIFIED WITH: CAROLINE COULTER 10/03/22 1431 AMK    Streptococcus pyogenes NOT DETECTED NOT DETECTED Final   A.calcoaceticus-baumannii NOT DETECTED NOT DETECTED Final   Bacteroides fragilis NOT DETECTED NOT DETECTED Final   Enterobacterales NOT DETECTED NOT DETECTED Final   Enterobacter cloacae complex NOT DETECTED NOT DETECTED Final    Escherichia coli NOT DETECTED NOT DETECTED Final   Klebsiella aerogenes NOT DETECTED NOT DETECTED Final   Klebsiella oxytoca NOT DETECTED NOT DETECTED Final   Klebsiella pneumoniae NOT DETECTED NOT DETECTED Final   Proteus species NOT DETECTED NOT DETECTED Final   Salmonella species NOT DETECTED NOT DETECTED Final   Serratia marcescens NOT DETECTED NOT DETECTED Final   Haemophilus influenzae NOT DETECTED NOT DETECTED Final   Neisseria meningitidis NOT DETECTED NOT DETECTED Final   Pseudomonas aeruginosa NOT DETECTED NOT DETECTED Final   Stenotrophomonas maltophilia NOT DETECTED NOT DETECTED Final   Candida albicans NOT DETECTED NOT DETECTED Final   Candida auris NOT DETECTED NOT DETECTED Final   Candida glabrata NOT DETECTED NOT DETECTED Final   Candida krusei NOT DETECTED NOT DETECTED Final   Candida parapsilosis NOT DETECTED NOT DETECTED Final   Candida tropicalis NOT DETECTED NOT DETECTED Final   Cryptococcus neoformans/gattii NOT DETECTED NOT DETECTED Final    Comment: Performed at [facility], [address] [place], [place] [address]  Blood Culture (routine x 2)     Status: None   Collection Time: 10/02/22  8:03 PM   Specimen: BLOOD RIGHT ARM  Result Value Ref Range Status   Specimen Description BLOOD RIGHT ARM  Final   Special Requests   Final    BOTTLES DRAWN AEROBIC AND ANAEROBIC Blood Culture results may not be optimal due to an inadequate volume of blood received in culture bottles   Culture   Final    NO GROWTH 5 DAYS Performed at [facility], [address] [place], [place] [address]    Report Status 10/07/2022 FINAL  Final  Culture, blood (Routine X 2) w Reflex to ID Panel     Status: None   Collection Time: 10/04/22  8:43 AM   Specimen: BLOOD RIGHT HAND  Result Value Ref Range Status   Specimen Description BLOOD RIGHT HAND  Final   Special Requests   Final    BOTTLES DRAWN AEROBIC AND ANAEROBIC Blood Culture adequate volume   Culture    Final    NO GROWTH 5 DAYS Performed at [facility], [address] [place], [place] [address]    Report Status 10/09/2022 FINAL  Final  Culture, blood (Routine X 2) w Reflex to ID Panel     Status: None   Collection Time: 10/04/22 10:18 AM   Specimen: BLOOD RIGHT HAND  Result Value Ref Range Status   Specimen Description BLOOD RIGHT HAND  Final   Special Requests   Final    BOTTLES DRAWN AEROBIC AND ANAEROBIC Blood Culture adequate volume   Culture   Final    NO GROWTH 5 DAYS Performed at [facility], [address], [place], [place] [address]  Report Status 10/09/2022 FINAL  Final    Labs: CBC: Recent Labs  Lab 10/06/22 0454 10/07/22 0642  WBC 5.2 5.0  HGB 9.7* 10.9*  HCT 29.0* 33.1*  MCV 103.9* 103.8*  PLT 196 [id number]   Basic Metabolic Panel: Recent Labs  Lab 10/06/22 0454 10/08/22 0539 10/09/22 0556  NA 132* 134* 131*  K 3.7 3.3* 3.9  CL 104 104 104  CO2 22 21* 20*  GLUCOSE 90 88 100*  BUN '17 20 19  '$ CREATININE 1.02* 1.11* 1.07*  CALCIUM 7.6* 7.6* 7.6*  MG 1.8  --   --   PHOS 2.6  --   --    Liver Function Tests: Recent Labs  Lab 10/06/22 0454 10/08/22 0539  AST 41 40  ALT 14 15  ALKPHOS 62 93  BILITOT 1.5* 1.4*  PROT 5.6* 5.6*  ALBUMIN 1.5* 1.5*   CBG: No results for input(s): "GLUCAP" in the last 168 hours.  Discharge time spent: greater than 30 minutes.  This record has been created using Systems analyst. Errors have been sought and corrected,but may not always be located. Such creation errors do not reflect on the standard of care.   Signed: Lorella Nimrod, MD [facility] 10/12/2022

## 2022-10-12 NOTE — TOC Progression Note (Signed)
Transition of Care [facility]) - Progression Note    Patient Details  Name: Kristina Ellis MRN: [id number] Date of Birth: 24-Oct-1950  [facility]) CM/SW Contact  Laurena Slimmer, RN Phone Number: 10/12/2022, 1:03 PM  Clinical Narrative:    Damaris Schooner with Carolynn Sayers from [facility].  [name] has been in contact with the patient sister, [name]. [name] is agreeable to assist with infusions as well as her other sister and will meet [name] in the patient's room today for teaching.   Attempt to reach patient's sister, [name] regarding discharge today No answer. Left a message.  [name] advised of discharge today.  Feliberto Harts from [facility] notified of discharge.   1:12pm Spoke with patient's sister, [name]. She confirmed plan for [facility] and to come today for teaching. She was advised patient would be discharging today.          Expected Discharge Plan and Services         Expected Discharge Date: 10/12/22                                     Social Determinants of Health (SDOH) Interventions SDOH Screenings   Food Insecurity: No Food Insecurity (10/03/2022)  Housing: Low Risk  (10/03/2022)  Transportation Needs: No Transportation Needs (10/03/2022)  Utilities: Not At Risk (10/03/2022)  Alcohol Screen: Low Risk  (06/18/2022)  Depression (PHQ2-9): Low Risk  (06/18/2022)  Financial Resource Strain: Low Risk  (06/18/2022)  Physical Activity: Inactive (06/18/2022)  Social Connections: Socially Isolated (06/18/2022)  Stress: No Stress Concern Present (06/18/2022)  Tobacco Use: Medium Risk (10/02/2022)    Readmission Risk Interventions     No data to display

## 2022-10-12 NOTE — Progress Notes (Signed)
ID Sisters at bed side learning to give IV antibiotics at home Pt is in bed Not much interactive but says she is fine No pain abdomen Patient Vitals for the past 24 hrs:  BP Temp Temp src Pulse Resp SpO2  10/12/22 1231 135/84 97.8 F (36.6 C) Oral 71 18 100 %  10/12/22 0846 128/78 97.7 F (36.5 C) Oral 70 18 100 %  10/12/22 0440 118/78 98.2 F (36.8 C) -- 64 17 97 %  10/11/22 2338 128/74 97.9 F (36.6 C) Oral 79 16 97 %   Chest b/l air entry  Hss1s2 Abd soft No tenderness  Labs    Latest Ref Rng & Units 10/07/2022    6:42 AM 10/06/2022    4:54 AM 10/05/2022    5:16 AM  CBC  WBC 4.0 - 10.5 K/uL 5.0  5.2  5.4   Hemoglobin 12.0 - 15.0 g/dL 10.9  9.7  10.0   Hematocrit 36.0 - 46.0 % 33.1  29.0  30.6   Platelets 150 - 400 K/uL 197  196  197        Latest Ref Rng & Units 10/09/2022    5:56 AM 10/08/2022    5:39 AM 10/06/2022    4:54 AM  CMP  Glucose 70 - 99 mg/dL 100  88  90   BUN 8 - 23 mg/dL '19  20  17   '$ Creatinine 0.44 - 1.00 mg/dL 1.07  1.11  1.02   Sodium 135 - 145 mmol/L 131  134  132   Potassium 3.5 - 5.1 mmol/L 3.9  3.3  3.7   Chloride 98 - 111 mmol/L 104  104  104   CO2 22 - 32 mmol/L '20  21  22   '$ Calcium 8.9 - 10.3 mg/dL 7.6  7.6  7.6   Total Protein 6.5 - 8.1 g/dL  5.6  5.6   Total Bilirubin 0.3 - 1.2 mg/dL  1.4  1.5   Alkaline Phos 38 - 126 U/L  93  62   AST 15 - 41 U/L  40  41   ALT 0 - 44 U/L  15  14      Micro Microbiology: 10/02/22 BC- strep pneumo 1 of 4 10/04/22 BC- NG  Impression/recommednation  Streptococcus pneumoniae bacteremia Mitral valve vegetation on 2 d echo ( TEE was recommended but patient did not want it) Being treated as endocarditis Pt will be sent home on IV cefazolin ( ceftriaxone not used because of Gall stones with complication)  Acute cholecysittis due to stone obstructing the neck of GB- PERC cholecystostomy was recommended by surgeon but she did not want anyintervention  Progressive bladder cancer  Mild left hydronephrosis  and hydroureter due to bladder ca on CT  Anemia  Afib on amiodarone OPAT treatment note done  Will follow her as OP Discussed the management in great detail with her 2 sisters

## 2022-10-12 NOTE — TOC Progression Note (Signed)
Transition of Care [facility]) - Progression Note    Patient Details  Name: KRISINDA TENCZA MRN: [id number] Date of Birth: October 10, 1950  Transition of Care [facility]) CM/SW Contact  Laurena Slimmer, RN Phone Number: 10/12/2022, 1:01 PM  Clinical Narrative:    Spoke with patient at bedside. She was not agreeable to [facility] She was advised HH would be needed for home infusions unless she was interested in outpatient infusions and therapy. Patient stated her sister would be assisting her and to contact because she wanted to rest.           Expected Discharge Plan and Services         Expected Discharge Date: 10/12/22                                     Social Determinants of Health ([place]) Interventions [place]: No Food Insecurity (10/03/2022)  Housing: Low Risk  (10/03/2022)  Transportation Needs: No Transportation Needs (10/03/2022)  Utilities: Not At Risk (10/03/2022)  Alcohol Screen: Low Risk  (06/18/2022)  Depression (PHQ2-9): Low Risk  (06/18/2022)  Financial Resource Strain: Low Risk  (06/18/2022)  Physical Activity: Inactive (06/18/2022)  Social Connections: Socially Isolated (06/18/2022)  Stress: No Stress Concern Present (06/18/2022)  Tobacco Use: Medium Risk (10/02/2022)    Readmission Risk Interventions     No data to display

## 2022-10-14 ENCOUNTER — Telehealth: Payer: Self-pay | Admitting: Oncology

## 2022-10-14 NOTE — Telephone Encounter (Signed)
Spoke to pt's son about hospital follow up appt with Dr. Grayland Ormond. He he stated that the pt has blood in her stool and he needed to know what to do for that.

## 2022-10-18 ENCOUNTER — Telehealth: Payer: Self-pay

## 2022-10-18 NOTE — Telephone Encounter (Signed)
Patient sister called stating patient was short of breathe and was requesting Oxygen being sent to local pharmacy. I verbalized to patient sister that we would have to check her oxygen level and place an order for oxygen first.But If patient breathing was to get worse please go to the local ED. Patient sister verbalized understanding and asked could we check patient's oxygen level at tomorrow appointment.

## 2022-10-19 ENCOUNTER — Inpatient Hospital Stay
Admission: EM | Admit: 2022-10-19 | Discharge: 2022-11-08 | DRG: 951 | Disposition: E | Payer: 59 | Attending: Internal Medicine | Admitting: Internal Medicine

## 2022-10-19 ENCOUNTER — Emergency Department: Payer: 59

## 2022-10-19 ENCOUNTER — Other Ambulatory Visit: Payer: Self-pay

## 2022-10-19 ENCOUNTER — Inpatient Hospital Stay: Payer: 59 | Attending: Oncology | Admitting: Oncology

## 2022-10-19 ENCOUNTER — Encounter: Payer: Self-pay | Admitting: Emergency Medicine

## 2022-10-19 DIAGNOSIS — G9341 Metabolic encephalopathy: Secondary | ICD-10-CM

## 2022-10-19 DIAGNOSIS — I5022 Chronic systolic (congestive) heart failure: Secondary | ICD-10-CM

## 2022-10-19 DIAGNOSIS — Z7722 Contact with and (suspected) exposure to environmental tobacco smoke (acute) (chronic): Secondary | ICD-10-CM | POA: Diagnosis present

## 2022-10-19 DIAGNOSIS — I252 Old myocardial infarction: Secondary | ICD-10-CM | POA: Diagnosis not present

## 2022-10-19 DIAGNOSIS — K625 Hemorrhage of anus and rectum: Secondary | ICD-10-CM | POA: Diagnosis present

## 2022-10-19 DIAGNOSIS — I38 Endocarditis, valve unspecified: Secondary | ICD-10-CM | POA: Diagnosis present

## 2022-10-19 DIAGNOSIS — I482 Chronic atrial fibrillation, unspecified: Secondary | ICD-10-CM | POA: Diagnosis present

## 2022-10-19 DIAGNOSIS — I2510 Atherosclerotic heart disease of native coronary artery without angina pectoris: Secondary | ICD-10-CM | POA: Diagnosis present

## 2022-10-19 DIAGNOSIS — D500 Iron deficiency anemia secondary to blood loss (chronic): Secondary | ICD-10-CM

## 2022-10-19 DIAGNOSIS — I700 Atherosclerosis of aorta: Secondary | ICD-10-CM | POA: Diagnosis present

## 2022-10-19 DIAGNOSIS — K760 Fatty (change of) liver, not elsewhere classified: Secondary | ICD-10-CM | POA: Diagnosis present

## 2022-10-19 DIAGNOSIS — D509 Iron deficiency anemia, unspecified: Secondary | ICD-10-CM | POA: Diagnosis not present

## 2022-10-19 DIAGNOSIS — Z888 Allergy status to other drugs, medicaments and biological substances status: Secondary | ICD-10-CM

## 2022-10-19 DIAGNOSIS — A403 Sepsis due to Streptococcus pneumoniae: Secondary | ICD-10-CM | POA: Diagnosis present

## 2022-10-19 DIAGNOSIS — D62 Acute posthemorrhagic anemia: Secondary | ICD-10-CM | POA: Diagnosis present

## 2022-10-19 DIAGNOSIS — Z789 Other specified health status: Secondary | ICD-10-CM | POA: Diagnosis not present

## 2022-10-19 DIAGNOSIS — Z8616 Personal history of COVID-19: Secondary | ICD-10-CM | POA: Diagnosis not present

## 2022-10-19 DIAGNOSIS — T68XXXA Hypothermia, initial encounter: Secondary | ICD-10-CM

## 2022-10-19 DIAGNOSIS — Z87891 Personal history of nicotine dependence: Secondary | ICD-10-CM

## 2022-10-19 DIAGNOSIS — K819 Cholecystitis, unspecified: Secondary | ICD-10-CM | POA: Diagnosis present

## 2022-10-19 DIAGNOSIS — I330 Acute and subacute infective endocarditis: Secondary | ICD-10-CM | POA: Diagnosis not present

## 2022-10-19 DIAGNOSIS — I2721 Secondary pulmonary arterial hypertension: Secondary | ICD-10-CM | POA: Diagnosis present

## 2022-10-19 DIAGNOSIS — I058 Other rheumatic mitral valve diseases: Secondary | ICD-10-CM | POA: Diagnosis present

## 2022-10-19 DIAGNOSIS — E871 Hypo-osmolality and hyponatremia: Secondary | ICD-10-CM

## 2022-10-19 DIAGNOSIS — R680 Hypothermia, not associated with low environmental temperature: Secondary | ICD-10-CM | POA: Diagnosis present

## 2022-10-19 DIAGNOSIS — A419 Sepsis, unspecified organism: Secondary | ICD-10-CM

## 2022-10-19 DIAGNOSIS — K7460 Unspecified cirrhosis of liver: Secondary | ICD-10-CM

## 2022-10-19 DIAGNOSIS — Z9221 Personal history of antineoplastic chemotherapy: Secondary | ICD-10-CM

## 2022-10-19 DIAGNOSIS — N179 Acute kidney failure, unspecified: Secondary | ICD-10-CM | POA: Diagnosis present

## 2022-10-19 DIAGNOSIS — R0902 Hypoxemia: Secondary | ICD-10-CM | POA: Diagnosis present

## 2022-10-19 DIAGNOSIS — Z8551 Personal history of malignant neoplasm of bladder: Secondary | ICD-10-CM

## 2022-10-19 DIAGNOSIS — Z1152 Encounter for screening for COVID-19: Secondary | ICD-10-CM | POA: Diagnosis not present

## 2022-10-19 DIAGNOSIS — M1990 Unspecified osteoarthritis, unspecified site: Secondary | ICD-10-CM | POA: Diagnosis present

## 2022-10-19 DIAGNOSIS — J9811 Atelectasis: Secondary | ICD-10-CM | POA: Diagnosis present

## 2022-10-19 DIAGNOSIS — C679 Malignant neoplasm of bladder, unspecified: Secondary | ICD-10-CM

## 2022-10-19 DIAGNOSIS — I110 Hypertensive heart disease with heart failure: Secondary | ICD-10-CM | POA: Diagnosis present

## 2022-10-19 DIAGNOSIS — R6521 Severe sepsis with septic shock: Secondary | ICD-10-CM | POA: Diagnosis present

## 2022-10-19 DIAGNOSIS — Z66 Do not resuscitate: Secondary | ICD-10-CM | POA: Diagnosis present

## 2022-10-19 DIAGNOSIS — I5023 Acute on chronic systolic (congestive) heart failure: Secondary | ICD-10-CM

## 2022-10-19 DIAGNOSIS — R4182 Altered mental status, unspecified: Principal | ICD-10-CM

## 2022-10-19 DIAGNOSIS — H4010X0 Unspecified open-angle glaucoma, stage unspecified: Secondary | ICD-10-CM | POA: Diagnosis present

## 2022-10-19 DIAGNOSIS — Z515 Encounter for palliative care: Principal | ICD-10-CM

## 2022-10-19 DIAGNOSIS — K922 Gastrointestinal hemorrhage, unspecified: Secondary | ICD-10-CM

## 2022-10-19 DIAGNOSIS — R1030 Lower abdominal pain, unspecified: Secondary | ICD-10-CM | POA: Diagnosis present

## 2022-10-19 DIAGNOSIS — Z7901 Long term (current) use of anticoagulants: Secondary | ICD-10-CM

## 2022-10-19 LAB — LIPASE, BLOOD: Lipase: 79 U/L — ABNORMAL HIGH (ref 11–51)

## 2022-10-19 LAB — BLOOD GAS, ARTERIAL
Acid-base deficit: 17.8 mmol/L — ABNORMAL HIGH (ref 0.0–2.0)
Bicarbonate: 8.4 mmol/L — ABNORMAL LOW (ref 20.0–28.0)
O2 Content: 3 L/min
O2 Saturation: 94 %
Patient temperature: 37
pCO2 arterial: 21 mmHg — ABNORMAL LOW (ref 32–48)
pH, Arterial: 7.21 — ABNORMAL LOW (ref 7.35–7.45)
pO2, Arterial: 77 mmHg — ABNORMAL LOW (ref 83–108)

## 2022-10-19 LAB — PROTIME-INR
INR: 3.3 — ABNORMAL HIGH (ref 0.8–1.2)
Prothrombin Time: 32.9 seconds — ABNORMAL HIGH (ref 11.4–15.2)

## 2022-10-19 LAB — CBC WITH DIFFERENTIAL/PLATELET
Abs Immature Granulocytes: 0.31 10*3/uL — ABNORMAL HIGH (ref 0.00–0.07)
Basophils Absolute: 0 10*3/uL (ref 0.0–0.1)
Basophils Relative: 0 %
Eosinophils Absolute: 0 10*3/uL (ref 0.0–0.5)
Eosinophils Relative: 0 %
HCT: 27 % — ABNORMAL LOW (ref 36.0–46.0)
Hemoglobin: 8.4 g/dL — ABNORMAL LOW (ref 12.0–15.0)
Immature Granulocytes: 2 %
Lymphocytes Relative: 12 %
Lymphs Abs: 2.1 10*3/uL (ref 0.7–4.0)
MCH: 35.1 pg — ABNORMAL HIGH (ref 26.0–34.0)
MCHC: 31.1 g/dL (ref 30.0–36.0)
MCV: 113 fL — ABNORMAL HIGH (ref 80.0–100.0)
Monocytes Absolute: 0.7 10*3/uL (ref 0.1–1.0)
Monocytes Relative: 4 %
Neutro Abs: 13.9 10*3/uL — ABNORMAL HIGH (ref 1.7–7.7)
Neutrophils Relative %: 82 %
Platelets: 205 10*3/uL (ref 150–400)
RBC: 2.39 MIL/uL — ABNORMAL LOW (ref 3.87–5.11)
RDW: 17.8 % — ABNORMAL HIGH (ref 11.5–15.5)
Smear Review: NORMAL
WBC: 17.1 10*3/uL — ABNORMAL HIGH (ref 4.0–10.5)
nRBC: 0.2 % (ref 0.0–0.2)

## 2022-10-19 LAB — COMPREHENSIVE METABOLIC PANEL
ALT: <5 U/L (ref 0–44)
AST: 75 U/L — ABNORMAL HIGH (ref 15–41)
Albumin: <1.5 g/dL — ABNORMAL LOW (ref 3.5–5.0)
Alkaline Phosphatase: 127 U/L — ABNORMAL HIGH (ref 38–126)
Anion gap: 16 — ABNORMAL HIGH (ref 5–15)
BUN: 47 mg/dL — ABNORMAL HIGH (ref 8–23)
CO2: 13 mmol/L — ABNORMAL LOW (ref 22–32)
Calcium: 7.9 mg/dL — ABNORMAL LOW (ref 8.9–10.3)
Chloride: 97 mmol/L — ABNORMAL LOW (ref 98–111)
Creatinine, Ser: 2.09 mg/dL — ABNORMAL HIGH (ref 0.44–1.00)
GFR, Estimated: 25 mL/min — ABNORMAL LOW (ref >60)
Glucose, Bld: 85 mg/dL (ref 70–99)
Potassium: 4.4 mmol/L (ref 3.5–5.1)
Sodium: 126 mmol/L — ABNORMAL LOW (ref 135–145)
Total Bilirubin: 1.3 mg/dL — ABNORMAL HIGH (ref 0.3–1.2)
Total Protein: 6.2 g/dL — ABNORMAL LOW (ref 6.5–8.1)

## 2022-10-19 LAB — LACTIC ACID, PLASMA
Lactic Acid, Venous: >9 mmol/L (ref 0.5–1.9)
Lactic Acid, Venous: >9 mmol/L (ref 0.5–1.9)

## 2022-10-19 LAB — APTT: aPTT: 62 seconds — ABNORMAL HIGH (ref 24–36)

## 2022-10-19 LAB — OSMOLALITY: Osmolality: 289 mOsm/kg (ref 275–295)

## 2022-10-19 LAB — RESP PANEL BY RT-PCR (RSV, FLU A&B, COVID)  RVPGX2
Influenza A by PCR: NEGATIVE
Influenza B by PCR: NEGATIVE
Resp Syncytial Virus by PCR: NEGATIVE
SARS Coronavirus 2 by RT PCR: NEGATIVE

## 2022-10-19 LAB — TYPE AND SCREEN
ABO/RH(D): AB POS
Antibody Screen: NEGATIVE

## 2022-10-19 LAB — BRAIN NATRIURETIC PEPTIDE: B Natriuretic Peptide: 421.8 pg/mL — ABNORMAL HIGH (ref 0.0–100.0)

## 2022-10-19 MED ORDER — SODIUM CHLORIDE 0.9 % IV BOLUS
1000.0000 mL | Freq: Once | INTRAVENOUS | Status: AC
  Administered 2022-10-19: 1000 mL via INTRAVENOUS

## 2022-10-19 MED ORDER — PANTOPRAZOLE 80MG IVPB - SIMPLE MED
80.0000 mg | Freq: Once | INTRAVENOUS | Status: AC
  Administered 2022-10-19: 80 mg via INTRAVENOUS
  Filled 2022-10-19: qty 100

## 2022-10-19 MED ORDER — SCOPOLAMINE 1 MG/3DAYS TD PT72
1.0000 | MEDICATED_PATCH | TRANSDERMAL | Status: DC
  Administered 2022-10-19: 1.5 mg via TRANSDERMAL
  Filled 2022-10-19 (×2): qty 1

## 2022-10-19 MED ORDER — SODIUM CHLORIDE 0.9 % IV SOLN
2.0000 g | Freq: Once | INTRAVENOUS | Status: AC
  Administered 2022-10-19: 2 g via INTRAVENOUS
  Filled 2022-10-19: qty 12.5

## 2022-10-19 MED ORDER — LORAZEPAM 2 MG/ML IJ SOLN
1.0000 mg | INTRAMUSCULAR | Status: DC | PRN
  Administered 2022-10-19: 1 mg via INTRAVENOUS
  Filled 2022-10-19: qty 1

## 2022-10-19 MED ORDER — LACTATED RINGERS IV BOLUS
1000.0000 mL | Freq: Once | INTRAVENOUS | Status: AC
  Administered 2022-10-19: 1000 mL via INTRAVENOUS

## 2022-10-19 MED ORDER — MORPHINE 100MG IN NS 100ML (1MG/ML) PREMIX INFUSION
1.0000 mg/h | INTRAVENOUS | Status: DC
  Administered 2022-10-19: 1 mg/h via INTRAVENOUS
  Filled 2022-10-19: qty 100

## 2022-10-19 MED ORDER — SODIUM CHLORIDE 0.9 % IV SOLN
50.0000 ug/h | INTRAVENOUS | Status: DC
  Filled 2022-10-19: qty 1

## 2022-10-19 MED ORDER — NAPHAZOLINE-GLYCERIN 0.012-0.25 % OP SOLN
1.0000 [drp] | Freq: Four times a day (QID) | OPHTHALMIC | Status: DC | PRN
  Filled 2022-10-19: qty 15

## 2022-10-19 MED ORDER — SODIUM CHLORIDE 1 G PO TABS
1.0000 g | ORAL_TABLET | Freq: Two times a day (BID) | ORAL | Status: DC
  Filled 2022-10-19: qty 1

## 2022-10-19 MED ORDER — ACETAMINOPHEN 325 MG PO TABS: 325.0000 mg | ORAL_TABLET | Freq: Four times a day (QID) | ORAL | Status: DC | PRN

## 2022-10-19 MED ORDER — ALBUTEROL SULFATE (2.5 MG/3ML) 0.083% IN NEBU: 2.5000 mg | INHALATION_SOLUTION | RESPIRATORY_TRACT | Status: DC | PRN

## 2022-10-19 MED ORDER — MORPHINE SULFATE (PF) 2 MG/ML IV SOLN
2.0000 mg | INTRAVENOUS | Status: DC | PRN
  Administered 2022-10-19: 2 mg via INTRAVENOUS
  Filled 2022-10-19: qty 1

## 2022-10-19 MED ORDER — DIPHENHYDRAMINE HCL 50 MG/ML IJ SOLN: 12.5000 mg | Freq: Three times a day (TID) | INTRAMUSCULAR | Status: DC | PRN

## 2022-10-19 MED ORDER — PANTOPRAZOLE INFUSION (NEW) - SIMPLE MED
8.0000 mg/h | INTRAVENOUS | Status: DC
  Administered 2022-10-19: 8 mg/h via INTRAVENOUS
  Filled 2022-10-19 (×2): qty 100

## 2022-10-19 MED ORDER — SODIUM CHLORIDE 0.9 % IV SOLN: INTRAVENOUS | Status: DC

## 2022-10-19 MED ORDER — VANCOMYCIN HCL IN DEXTROSE 1-5 GM/200ML-% IV SOLN
1000.0000 mg | Freq: Once | INTRAVENOUS | Status: AC
  Administered 2022-10-19: 1000 mg via INTRAVENOUS
  Filled 2022-10-19: qty 200

## 2022-10-19 MED ORDER — PANTOPRAZOLE SODIUM 40 MG IV SOLR: 40.0000 mg | Freq: Two times a day (BID) | INTRAVENOUS | Status: DC

## 2022-10-19 MED ORDER — DM-GUAIFENESIN ER 30-600 MG PO TB12: 1.0000 | ORAL_TABLET | Freq: Two times a day (BID) | ORAL | Status: DC | PRN

## 2022-10-19 NOTE — H&P (Addendum)
History and Physical    Kristina Ellis [id number] DOB: July 01, 1951 DOA: 10/18/2022  Referring MD/NP/PA:   PCP: [facility] [place]   Patient coming from:  The patient is coming from home.    Chief Complaint: Altered mental status, abdominal pain, shortness of breath, GI bleeding  HPI: Kristina Ellis is a 72 y.o. female with medical history significant of stage IV bladder cancer (s/p of chemo and Keytruda treatment), liver cirrhosis, hypertension, CAD, systolic CHF with EF of Q000111Q, former smoker, tobacco abuse, marijuana abuse, anemia, pulmonary hypertension, kidney stone, atrial fibrillation on Eliquis, gallstone, recent admission due to severe sepsis secondary to streptococcus pneumoniae bacteremia, endocarditis and possible cholecystitis, who presents with altered mental status, abdominal pain, shortness of breath, GI bleeding.  Patient was recently hospitalized from 2/24 - 3/5 due to severe sepsis secondary to streptococcus pneumoniae bacteremia and endocarditis.  Patient had mitral valve regurgitation on 2D echo. Pt also had possible cholecystitis.  Patient was seen by infectious disease, Dr. Steva Ready.  Pt was discharged on IV antibiotics of Cefazoline for 4-6 weeks with PICC line placement.    Per family at bedside, pt developed confusion in the past several days.  Patient has worsening abdominal pain.  Her abdominal pain is diffuse, seem to be severe, constant, nonradiating. She was noted to have had about 8-9 times of rectal bleeding with bright red blood.  Patient has nausea, no vomiting or abdominal pain.  Patient also has shortness breath and dry cough.  Does not seem to have chest pain.  Patient normally is not using oxygen, but was found to have oxygen desaturation to 62% on room air, which improved to 90% on 3 L oxygen.  Her shortness of breath is likely due to acute on chronic systolic CHF exacerbation.  Her BNP is elevated 421 today.  Patient has 3+  bilateral leg edema.  Not sure if patient has symptoms of UTI.  When I saw in ED, pt is confused.  She is not orientated to the place and time, but she seems to know her own name.  She moves all extremities slightly.  No facial droop noted.  At her normal baseline, patient is oriented x 3 per family.  Patient is extremely weak.  Her initial blood pressure was 119/90, 132/90 and 150/78, then decreased to 83/58, this happened after patient was given 1 L normal saline bolus in ED.   Data reviewed independently and ED Course: pt was found to have WBC 17.1, hemoglobin dropped from 10.9 on 10/07/2022 to 8.4 today, negative PCR for COVID, flu and RSV, Lipase is 79, liver function (ALP 127, AST 75, ALT <5.0, total bilirubin 1.3), hyponatremia with sodium 126, acute renal injury with creatinine 2.09, BUN 47, GFR 25 (recent baseline creatinine 1.07 on 10/09/2022).  Hypothermia with temperature 95.5, heart rate 26,  118, 40, RR 24, 38, 30.  Chest x-ray showed bilateral basilar atelectasis.  ABG with pH 7.21, CO2 21, O2 77.  Patient is placed on MedSurg bed for comfort measures.  EKG: I have personally reviewed.  Atrial fibrillation, QTc 569, low voltage, T wave inversion in inferior leads.   Review of Systems: Could not be reviewed due to altered mental status.   Allergy:  Allergies  Allergen Reactions   Atenolol Other (See Comments)    bradycardia bradycardia     Past Medical History:  Diagnosis Date   Aortic atherosclerosis (HCC)    Arthritis    Atrial fibrillation ([place])    a.)  s/p TEE with cardioversion on 02/04/2020. b.) on daily apixaban.   Bladder mass    a.) CT 04/08/2021 --> 3.2 cm intraluminal bladder mass.   Carotid stenosis, bilateral    a.) Doppler 07/04/2020 --> mild; 1-49% stenosis BILATERALLY.   Cholelithiasis    a.) CT 11/06/2020 --> largest measured 4.5 cm   Chronic anticoagulation    a.) Apixaban   Common biliary duct calculus    a.) CT 04/08/2021 --> CBD dilated at 8 mm; 46m  calculus within distal CBD.   Coronary artery disease involving native coronary artery of native heart without angina pectoris [id number]  Diastolic dysfunction    a.) TTE 07/04/2020 --> LVEF 60-65%; G1DD.   Hepatic cirrhosis (HCC)    Hepatic steatosis    Hepatitis 1970   History of 2019 novel coronavirus disease (COVID-19) 12/20/2019   History of marijuana use    Hypertension    Hypertensive retinopathy    IDA (iron deficiency anemia)    Mitral stenosis    a.) TEE 04/10/2020 --> mild. b.) TTE 07/04/2020 --> EF 60-65%; mild (mean gradient 5 mmHg).   Moderate [place] arterial systolic [facility] [place] 12/22/[place]   Nephrolithiasis    [place]STEMI (non-ST elevated [place] infarction) [place] 07/26/[place]   Open-angle glaucoma 09/11/2010   PAH (pulmonary artery hypertension) ([place]    a.) TTE 12/21/2019 --> PASP 44 mmHg.   Uterine fibroid    a.) CT 04/08/2021 --> multiple with largest measuring 7 cm.   Valvular regurgitation    a.) TTE 09/01/2010 --> trivial to mild pan-valvular. b.) TTE 12/21/2019 --> mild MR and AR; moderate TR. c.) TTE 07/04/2020 --> mild TR; trivial MR and PR.   Vestibular neuronitis     Past Surgical History:  Procedure Laterality Date   BREAST CYST ASPIRATION Left    COLONOSCOPY  2012   ERCP N/A 12/18/2021   Procedure: ENDOSCOPIC RETROGRADE CHOLANGIOPANCREATOGRAPHY (ERCP);  Surgeon: WLucilla Lame MD;  [facility];  Service: Endoscopy;  Laterality: N/A;   TEE WITH CARDIOVERSION N/A 02/04/2020   Procedure: TEE WITH CARDIOVERSION; Location: [facility]; Surgeon: CKandis Cocking MD   TRANSURETHRAL RESECTION OF BLADDER TUMOR N/A 05/15/2021   Procedure: TRANSURETHRAL RESECTION OF BLADDER TUMOR (TURBT);  Surgeon: SBilley Co MD;  Location: [facility]S;  Service: Urology;  Laterality: N/A;    Social History:  reports that she has quit smoking. Her smoking use included cigarettes. She has a 1.00 pack-year smoking history. She has been exposed to tobacco smoke. She has never  used smokeless tobacco. She reports current alcohol use of about 5.0 standard drinks of alcohol per week. She reports that she does not currently use drugs after having used the following drugs: Marijuana.  Family History:  Family History  Problem Relation Age of Onset   Aneurysm Mother    Colon cancer Father    Lung cancer Brother    Breast cancer Neg Hx      Prior to Admission medications   Medication Sig Start Date End Date Taking? Authorizing Provider  ceFAZolin (ANCEF) IVPB Inject 2 g into the vein every 8 (eight) hours. Indication:  S. Pneumoniae bacteremia, cholecystitis and possible endocarditis First Dose: Yes Last Day of Therapy:  11/11/2022 Labs - Once weekly:  CBC/D and CMP Please pull PIC at completion of IV antibiotics  Fax weekly lab results  promptly to [phone] Method of administration: IV Push Method of administration may be changed at the discretion of home infusion pharmacist based upon assessment of the patient and/or caregiver's  ability to self-administer the medication ordered. 10/12/22 11/11/22  Lorella Nimrod, MD  diltiazem (CARDIZEM) 60 MG tablet Take 1 tablet (60 mg total) by mouth every 12 (twelve) hours. 10/12/22   Lorella Nimrod, MD  ELIQUIS 5 MG TABS tablet Take 1 tablet (5 mg total) by mouth 2 (two) times daily. 04/14/22 09/21/22  Lorella Nimrod, MD  loperamide (IMODIUM) 2 MG capsule Take 1 capsule (2 mg total) by mouth as needed for diarrhea or loose stools. 10/12/22   Lorella Nimrod, MD  magnesium oxide (MAG-OX) 400 MG tablet Take 1 tablet by mouth 2 (two) times daily. 03/24/20   [provider]  metoprolol tartrate (LOPRESSOR) 50 MG tablet Take 1 tablet (50 mg total) by mouth 2 (two) times daily. 10/12/22   Lorella Nimrod, MD  midodrine (PROAMATINE) 5 MG tablet Take 1 tablet (5 mg total) by mouth 3 (three) times daily with meals. 10/12/22   Lorella Nimrod, MD  Multiple Vitamin (MULTIVITAMIN WITH MINERALS) TABS tablet Take 1 tablet by mouth daily. Patient not  taking: Reported on 08/31/2022 04/15/22   Lorella Nimrod, MD  ondansetron (ZOFRAN) 4 MG tablet Take 1 tablet (4 mg total) by mouth every 6 (six) hours as needed for nausea. 10/12/22   Lorella Nimrod, MD  trolamine salicylate (ASPERCREME) 10 % cream Apply 1 application topically as needed for muscle pain.    [provider]    Physical Exam: Vitals:   10/22/2022 1400 11/05/2022 1415 10/15/2022 1500 10/28/2022 1515  BP: 90/62 (!) 88/64 (!) 82/63 (!) 83/51  Pulse:   61 64  Resp: (!) 28 (!) 28 (!) 28 (!) 29  Temp:      SpO2:   93% 92%  Weight:       General: Not in acute distress HEENT:       Eyes: PERRL, EOMI, no scleral icterus.       ENT: No discharge from the ears and nose       Neck: positive JVD, no bruit, no mass felt. Heme: No neck lymph node enlargement. Cardiac: S1/S2, RRR, No murmurs, No gallops or rubs. Respiratory: No rales, wheezing, rhonchi or rubs. GI: distended with diffused tenderness, BS present. GU: No hematuria Ext: 3+ pitting leg edema bilaterally. 1+DP/PT pulse bilaterally. Musculoskeletal: No joint deformities, No joint redness or warmth, no limitation of ROM in spin. Skin: No rashes.  Neuro: Confused, seems to know her own name, not oriented to time and place, not following commands, cranial nerves II-XII grossly intact, moves all extremities slightly. Psych: Patient is not psychotic, no suicidal or hemocidal ideation.  Labs on Admission: I have personally reviewed following labs and imaging studies  CBC: Recent Labs  Lab 11/02/2022 1012  WBC 17.1*  NEUTROABS 13.9*  HGB 8.4*  HCT 27.0*  MCV 113.0*  PLT [id number]   Basic Metabolic Panel: Recent Labs  Lab 10/13/2022 1012  NA 126*  K 4.4  CL 97*  CO2 13*  GLUCOSE 85  BUN 47*  CREATININE 2.09*  CALCIUM 7.9*   GFR: Estimated Creatinine Clearance: 20.5 mL/min (A) (by C-G formula based on SCr of 2.09 mg/dL (H)). Liver Function Tests: Recent Labs  Lab 10/15/2022 1012  AST 75*  ALT <5  ALKPHOS 127*  BILITOT  1.3*  PROT 6.2*  ALBUMIN <1.5*   Recent Labs  Lab 11/07/2022 1012  LIPASE 79*   No results for input(s): "AMMONIA" in the last 168 hours. Coagulation Profile: Recent Labs  Lab 11/07/2022 1012  INR 3.3*   Cardiac Enzymes: No  results for input(s): "CKTOTAL", "CKMB", "CKMBINDEX", "TROPONINI" in the last 168 hours. BNP (last 3 results) No results for input(s): "PROBNP" in the last 8760 hours. HbA1C: No results for input(s): "HGBA1C" in the last 72 hours. CBG: No results for input(s): "GLUCAP" in the last 168 hours. Lipid Profile: No results for input(s): "CHOL", "HDL", "LDLCALC", "TRIG", "CHOLHDL", "LDLDIRECT" in the last 72 hours. Thyroid Function Tests: No results for input(s): "TSH", "T4TOTAL", "FREET4", "T3FREE", "THYROIDAB" in the last 72 hours. Anemia Panel: No results for input(s): "VITAMINB12", "FOLATE", "FERRITIN", "TIBC", "IRON", "RETICCTPCT" in the last 72 hours. Urine analysis:    Component Value Date/Time   COLORURINE AMBER (A) 10/02/2022 2005   COLORURINE AMBER (A) 10/02/2022 2005   APPEARANCEUR HAZY (A) 10/02/2022 2005   APPEARANCEUR HAZY (A) 10/02/2022 2005   APPEARANCEUR Clear 07/29/2022 1353   LABSPEC 1.019 10/02/2022 2005   LABSPEC 1.019 10/02/2022 2005   PHURINE 6.0 10/02/2022 2005   PHURINE 6.0 10/02/2022 2005   GLUCOSEU NEGATIVE 10/02/2022 2005   GLUCOSEU NEGATIVE 10/02/2022 2005   HGBUR NEGATIVE 10/02/2022 2005   HGBUR NEGATIVE 10/02/2022 2005   BILIRUBINUR NEGATIVE 10/02/2022 2005   BILIRUBINUR SMALL (A) 10/02/2022 2005   BILIRUBINUR Negative 07/29/2022 1353   [place] 10/02/2022 2005   KETONESUR NEGATIVE 10/02/2022 2005   PROTEINUR 100 (A) 10/02/2022 2005   PROTEINUR 100 (A) 10/02/2022 2005   NITRITE NEGATIVE 10/02/2022 2005   NITRITE NEGATIVE 10/02/2022 2005   LEUKOCYTESUR TRACE (A) 10/02/2022 2005   LEUKOCYTESUR SMALL (A) 10/02/2022 2005   Sepsis Labs: '@LABRCNTIP'$ (procalcitonin:4,lacticidven:4) ) Recent Results (from the past  240 hour(s))  Resp panel by RT-PCR (RSV, Flu A&B, Covid) Anterior Nasal Swab     Status: None   Collection Time: 11/06/2022 12:38 PM   Specimen: Anterior Nasal Swab  Result Value Ref Range Status   SARS Coronavirus 2 by RT PCR NEGATIVE NEGATIVE Final    Comment: (NOTE) SARS-CoV-2 target nucleic acids are NOT DETECTED.  The SARS-CoV-2 RNA is generally detectable in upper respiratory specimens during the acute phase of infection. The lowest concentration of SARS-CoV-2 viral copies this assay can detect is 138 copies/mL. A negative result does not preclude SARS-Cov-2 infection and should not be used as the sole basis for treatment or other patient management decisions. A negative result may occur with  improper specimen collection/handling, submission of specimen other than nasopharyngeal swab, presence of viral mutation(s) within the areas targeted by this assay, and inadequate number of viral copies(<138 copies/mL). A negative result must be combined with clinical observations, patient history, and epidemiological information. The expected result is Negative.  Fact Sheet for Patients:  EntrepreneurPulse.com.au  Fact Sheet for Healthcare Providers:  IncredibleEmployment.be  This test is no t yet approved or cleared by the [place] FDA and  has been authorized for detection and/or diagnosis of SARS-CoV-2 by FDA under an Emergency Use Authorization (EUA). This EUA will remain  in effect (meaning this test can be used) for the duration of the COVID-19 declaration under Section 564(b)(1) of the Act, 21 U.S.C.section 360bbb-3(b)(1), unless the authorization is terminated  or revoked sooner.       Influenza A by PCR NEGATIVE NEGATIVE Final   Influenza B by PCR NEGATIVE NEGATIVE Final    Comment: (NOTE) The Xpert Xpress SARS-CoV-2/FLU/RSV plus assay is intended as an aid in the diagnosis of influenza from Nasopharyngeal swab specimens and should  not be used as a sole basis for treatment. Nasal washings and aspirates are unacceptable for Xpert Xpress SARS-CoV-2/FLU/RSV testing.  Fact Sheet for Patients: EntrepreneurPulse.com.au  Fact Sheet for Healthcare Providers: IncredibleEmployment.be  This test is not yet approved or cleared by the [place] FDA and has been authorized for detection and/or diagnosis of SARS-CoV-2 by FDA under an Emergency Use Authorization (EUA). This EUA will remain in effect (meaning this test can be used) for the duration of the COVID-19 declaration under Section 564(b)(1) of the Act, 21 U.S.C. section 360bbb-3(b)(1), unless the authorization is terminated or revoked.     Resp Syncytial Virus by PCR NEGATIVE NEGATIVE Final    Comment: (NOTE) Fact Sheet for Patients: EntrepreneurPulse.com.au  Fact Sheet for Healthcare Providers: IncredibleEmployment.be  This test is not yet approved or cleared by the [place] FDA and has been authorized for detection and/or diagnosis of SARS-CoV-2 by FDA under an Emergency Use Authorization (EUA). This EUA will remain in effect (meaning this test can be used) for the duration of the COVID-19 declaration under Section 564(b)(1) of the Act, 21 U.S.C. section 360bbb-3(b)(1), unless the authorization is terminated or revoked.  Performed at [facility], [address] [place], [place] [address]      Radiological Exams on Admission: DG Chest [facility] 1 View  Result Date: 10/16/2022 CLINICAL DATA:  Questionable sepsis, evaluate for abnormality. EXAM: PORTABLE CHEST 1 VIEW COMPARISON:  10/11/2022. FINDINGS: Unchanged left upper extremity PICC with tip terminating over the right atrium. Low lung volumes accentuate the pulmonary vasculature and cardiomediastinal silhouette. Unchanged bibasilar atelectasis. No new airspace opacity. No pleural effusion or pneumothorax. Severe  degenerative changes of the glenohumeral joints. IMPRESSION: Unchanged low lung volumes with bibasilar atelectasis. No acute findings. Electronically Signed   By: Emmit Alexanders M.D.   On: 10/21/2022 12:20      Assessment/Plan Principal Problem:   Comfort measures only status Active Problems:   Endocarditis   Severe sepsis with septic shock (HCC)   Cholecystitis   Iron deficiency anemia   GI bleeding   Blood loss anemia   Liver cirrhosis (HCC)   CAD (coronary artery disease)   Acute on chronic systolic CHF (congestive heart failure) (HCC)   Bladder cancer (HCC)   Hyponatremia   AKI (acute kidney injury) ([place])   Atrial fibrillation, chronic (HCC)   Acute metabolic encephalopathy   Alcohol use   Hypothermia   Assessment and Plan:  Comfort measures only status: Patient has multiple severe chronic comorbidities as listed above, including stage IV bilateral cancer, liver cirrhosis, systolic CHF with EF Q000111Q, atrial fibrillation on Eliquis, recent diagnosis of endocarditis and streptococcus pneumoniae bacteremia. Patient presents with multiple acute complicated issues today, including altered mental status, severe sepsis with septic shock, GI bleeding, hyponatremia, worsening anemia, AKI, hypothermia.  Patient developed hypotension which has been persistent even after giving IV fluid and broad antibiotics of vancomycin and Zosyn in ED. Her prognosis is extremely poor. I have had extensive discussion with the family in the presence of her power of attorney.  Patient's family members were very supportive. They agree for comfort care now, which is very reasonable given her progressive decline. Pt will be DNR.  -Will admit to med-surg bed for comfort care. -Stop drawing labs and IVF -prn LORazepam for agitation -Morphine gtt -Naphazoline 0.1 % ophthalmic solution -scopolamine patch -Psycho/Social: emotional support offered to family at bedside -consult to palliterative care team -Nurse  may pronounce death  Other active issues are listed as below, will stop home medications, focus on comfort care.   Endocarditis   Severe sepsis with septic shock (HCC)  Cholecystitis   Iron deficiency anemia   GI bleeding   Blood loss anemia   Liver cirrhosis (HCC)   CAD (coronary artery disease)   Acute on chronic systolic CHF (congestive heart failure) (HCC)   Bladder cancer (HCC)   Hyponatremia   AKI (acute kidney injury) ([place])   Atrial fibrillation, chronic (HCC)   Acute metabolic encephalopathy   Alcohol use   Hypothermia   Acute blood loss anemia     DVT ppx: none  Code Status: DNR per power of attorney  Family Communication: Family is at the bedside, including 2 sisters, one of her sisters is her power of attorney, 4 nieces and one friend.  Disposition Plan:  to be determined  Consults called:  none  Admission status and Level of care: Med-Surg:     as inpt         Dispo: The patient is from: Home              Anticipated d/c is to:  To be determined              Anticipated d/c date is: 2 days              Patient currently is not medically stable to d/c.    Severity of Illness:  The appropriate patient status for this patient is INPATIENT. Inpatient status is judged to be reasonable and necessary in order to provide the required intensity of service to ensure the patient's safety. The patient's presenting symptoms, physical exam findings, and initial radiographic and laboratory data in the context of their chronic comorbidities is felt to place them at high risk for further clinical deterioration. Furthermore, it is not anticipated that the patient will be medically stable for discharge from the hospital within 2 midnights of admission.   * I certify that at the point of admission it is my clinical judgment that the patient will require inpatient hospital care spanning beyond 2 midnights from the point of admission due to high intensity of service, high  risk for further deterioration and high frequency of surveillance required.*       Date of Service 10/11/2022    Ivor Costa [facility]   If 7PM-7AM, please contact night-coverage [email or web address] 10/24/2022, 3:44 PM

## 2022-10-19 NOTE — Progress Notes (Signed)
Patient's family with several questions about end-of-life and comfort care. Family members educated, verbalized understanding.

## 2022-10-19 NOTE — Significant Event (Signed)
       CROSS COVER NOTE  NAME: Kristina Ellis MRN: [id number] DOB : [birth year] ATTENDING PHYSICIAN: Ivor Costa, MD    Notified by RN that patient has expired at 2240  Patient was DNR/DNI and comfort care  2 RN verified.  Family was present and available to RN.    This document was prepared using Dragon voice recognition software and may include unintentional dictation errors.  Neomia Glass DNP, MBA, FNP-BC Nurse Practitioner [facility] Pager [phone]

## 2022-10-19 NOTE — IPAL (Addendum)
Interdisciplinary Goals of Care Family Meeting  Interdisciplinary Goals of Care Family Meeting Date carried out: 10/23/2022 Location of the meeting: Bedside in ED Member's involved: MD and family at the bedside, including 2 sisters, one of her sisters is her power of attorney , 4 nieces and one friend.   Durable Power of Attorney or acting medical decision maker: [name] who is her sister as POA Discussion: We discussed goals of care for Kristina Ellis .  Decision is made by family for full comfort care. Code status: Full DNR Disposition: In-patient comfort care  Time spent for the meeting:  30 min Ivor Costa 11/06/2022, '@NOW'$ 

## 2022-10-19 NOTE — Progress Notes (Signed)
Patient noted to be absent of respirations and heartbeat . Earlie Server RN confirmed absence of respiration and heartbeat.  Patient pronounced dead at 54.  Family at bedside, comfort given, along with explanation of our process.

## 2022-10-19 NOTE — ED Provider Notes (Signed)
[facility] Provider Note    Event Date/Time   First MD Initiated Contact with Patient 10/29/2022 1000     (approximate)   History   Altered Mental Status   HPI  Kristina Ellis is a 72 y.o. female who presents to the emergency department today from oncology clinic because of concerns for altered mental status and hypoxia.  Patient has a history of stage IV bladder cancer and family states they were going there to have her shortness of breath evaluated.  History is obtained primarily from son at bedside.  States she has been short of breath for the past day or 2.  Also complaining of some lower abdominal pain which the family states is chronic.  Per chart review the patient was discharged from the hospital about a week ago after an admission for sepsis.  Was found to have cholecystitis at that time although declined any intervention.  Also concern for possible vegetations on valves.  Was sent home on a PICC line to continue receiving IV antibiotics.     Physical Exam   Triage Vital Signs: ED Triage Vitals [10/28/2022 1000]  Enc Vitals Group     BP (!) 111/90     Pulse Rate (!) 118     Resp (!) 24     Temp      Temp src      SpO2 100 %     Weight      Height      Head Circumference      Peak Flow      Pain Score      Pain Loc      Pain Edu?      Excl. in [place]?     Most recent vital signs: Vitals:   11/04/2022 1000 11/03/2022 1100  BP: (!) 111/90 110/84  Pulse: (!) 118 (!) 26  Resp: (!) 24 (!) 36  SpO2: 100% (!) 62%    General: Somnolent, awakens to physical stimuli CV:  Good peripheral perfusion. tachycardia Resp:  Normal effort. Increased respiratory effort. Abd:  No distention. Tender in the lower abdomen.    ED Results / Procedures / Treatments   Labs (all labs ordered are listed, but only abnormal results are displayed) Labs Reviewed  LACTIC ACID, PLASMA - Abnormal; Notable for the following components:      Result Value   Lactic  Acid, Venous >9.0 (*)    All other components within normal limits  COMPREHENSIVE METABOLIC PANEL - Abnormal; Notable for the following components:   Sodium 126 (*)    Chloride 97 (*)    CO2 13 (*)    BUN 47 (*)    Creatinine, Ser 2.09 (*)    Calcium 7.9 (*)    Total Protein 6.2 (*)    Albumin <1.5 (*)    AST 75 (*)    Alkaline Phosphatase 127 (*)    Total Bilirubin 1.3 (*)    GFR, Estimated 25 (*)    Anion gap 16 (*)    All other components within normal limits  CBC WITH DIFFERENTIAL/PLATELET - Abnormal; Notable for the following components:   WBC 17.1 (*)    RBC 2.39 (*)    Hemoglobin 8.4 (*)    HCT 27.0 (*)    MCV 113.0 (*)    MCH 35.1 (*)    RDW 17.8 (*)    Neutro Abs 13.9 (*)    Abs Immature Granulocytes 0.31 (*)    All other  components within normal limits  CULTURE, BLOOD (ROUTINE X 2)  CULTURE, BLOOD (ROUTINE X 2)  RESP PANEL BY RT-PCR (RSV, FLU A&B, COVID)  RVPGX2  LACTIC ACID, PLASMA  URINALYSIS, COMPLETE (UACMP) WITH MICROSCOPIC  BLOOD GAS, ARTERIAL  TYPE AND SCREEN     EKG  I, Nance Pear, attending physician, personally viewed and interpreted this EKG  EKG Time: 1020 Rate: 88 Rhythm: atrial fibrillation Axis: normal  Intervals: qtc 569 QRS: narro ST changes: no st elevation Impression: abnormal ekg    RADIOLOGY I independently interpreted and visualized the CXR. My interpretation: Question right sided opacity Radiology interpretation:  IMPRESSION:  Unchanged low lung volumes with bibasilar atelectasis. No acute  findings.     PROCEDURES:  Critical Care performed: Yes  CRITICAL CARE Performed by: Nance Pear   Total critical care time: 40 minutes  Critical care time was exclusive of separately billable procedures and treating other patients.  Critical care was necessary to treat or prevent imminent or life-threatening deterioration.  Critical care was time spent personally by me on the following activities: development of  treatment plan with patient and/or surrogate as well as nursing, discussions with consultants, evaluation of patient's response to treatment, examination of patient, obtaining history from patient or surrogate, ordering and performing treatments and interventions, ordering and review of laboratory studies, ordering and review of radiographic studies, pulse oximetry and re-evaluation of patient's condition.   Procedures    MEDICATIONS ORDERED IN ED: Medications  vancomycin (VANCOCIN) IVPB 1000 mg/200 mL premix (has no administration in time range)  ceFEPIme (MAXIPIME) 2 g in sodium chloride 0.9 % 100 mL IVPB (2 g Intravenous New Bag/Given 10/24/2022 1119)  pantoprazole (PROTONIX) 80 mg /NS 100 mL IVPB (80 mg Intravenous New Bag/Given 10/29/2022 1121)  pantoprozole (PROTONIX) 80 mg /NS 100 mL infusion (has no administration in time range)  pantoprazole (PROTONIX) injection 40 mg (has no administration in time range)  sodium chloride 0.9 % bolus 1,000 mL (1,000 mLs Intravenous New Bag/Given 10/10/2022 1050)  lactated ringers bolus 1,000 mL (1,000 mLs Intravenous New Bag/Given 10/28/2022 1117)     IMPRESSION / MDM / ASSESSMENT AND PLAN / ED COURSE  I reviewed the triage vital signs and the nursing notes.                              Differential diagnosis includes, but is not limited to, infection, dehydration, electrolyte abnormality  Patient's presentation is most consistent with acute presentation with potential threat to life or bodily function.   The patient is on the cardiac monitor to evaluate for evidence of arrhythmia and/or significant heart rate changes.  Patient presented to the emergency department today because of concerns for altered mental status and hypoxia noted at oncology clinic.  On initial exam patient was quite somnolent.  It was hard to get a good initial O2 sat but patient was continued on oxygen.  Additionally had concern for dehydration given low blood pressure.  On chart  review she had recent admission for sepsis.  Lactic acid level is greater than 9 today.  I do have concerns for sepsis again.  Broad-spectrum IV antibiotics were started.  Additionally while here patient had a couple small bowel movements streaked with blood.  The bowel movements themselves were darker in color.  She does have a slight drop in her hemoglobin compared to blood work at time of discharge.  Did start IV Protonix given concern for possible  GI bleed.   After IV fluids the patient's blood pressure did improve and she clinically became more responsive.  Discussed with Dr. Blaine Hamper with hospitalist service who will plan on admission.      FINAL CLINICAL IMPRESSION(S) / ED DIAGNOSES   Final diagnoses:  Altered mental status, unspecified altered mental status type  Sepsis, due to unspecified organism, unspecified whether acute organ dysfunction present [facility])      Note:  This document was prepared using Dragon voice recognition software and may include unintentional dictation errors.    Nance Pear, MD 10/16/2022 1341

## 2022-10-19 NOTE — Progress Notes (Signed)
Patient medicated for dyspnea with prn morphine

## 2022-10-19 NOTE — ED Triage Notes (Signed)
Pt brought straight back from triage for abd pain, sob, decreased LOC. Pt with hx lower abd CA.

## 2022-10-19 NOTE — Progress Notes (Signed)
Patient arrived to unit via bed with admission RN. Patient alert, but disoriented. Patient lethargic, with cold extremities. Labored, shallow breathing noted. Comfort care status.

## 2022-10-24 LAB — CULTURE, BLOOD (ROUTINE X 2): Culture: NO GROWTH

## 2022-10-25 ENCOUNTER — Telehealth: Payer: 59 | Admitting: Hospice and Palliative Medicine

## 2022-10-26 ENCOUNTER — Telehealth: Payer: Self-pay | Admitting: *Deleted

## 2022-10-26 ENCOUNTER — Inpatient Hospital Stay: Payer: 59 | Admitting: Oncology

## 2022-10-26 NOTE — Telephone Encounter (Signed)
Message from [place] with [employer] stating that a family member told him that patient has passed away and he is asking for more information regarding that patient use of Keytruda, her demographics and other health history. Please return his call

## 2022-11-02 ENCOUNTER — Ambulatory Visit: Payer: 59

## 2022-11-02 ENCOUNTER — Ambulatory Visit: Payer: 59 | Admitting: Oncology

## 2022-11-02 ENCOUNTER — Other Ambulatory Visit: Payer: 59

## 2022-11-04 ENCOUNTER — Inpatient Hospital Stay: Payer: 59 | Admitting: Infectious Diseases

## 2022-11-08 NOTE — Death Summary Note (Signed)
DEATH SUMMARY   Patient Details  Name: Kristina Ellis MRN: [id number] DOB: [birth year] [id number], [place] Admission/Discharge Information   Admit Date:  22-Oct-2022  Date of Death: Date of Death: 2022-10-22  Time of Death: Time of Death: 2238-10-27  Length of Stay: 1   Principle Cause of death: severe sepsis with septic shock  Hospital Diagnoses: Principal Problem:   Comfort measures only status Active Problems:   Endocarditis   Severe sepsis with septic shock ([place])   Cholecystitis   Iron deficiency anemia   GI bleeding   Blood loss anemia   Liver cirrhosis (HCC)   CAD (coronary artery disease)   Acute on chronic systolic CHF (congestive heart failure) (HCC)   Bladder cancer (HCC)   Hyponatremia   AKI (acute kidney injury) ([place])   Atrial fibrillation, chronic ([place])   Acute metabolic encephalopathy   Alcohol use   Hypothermia   Hospital Course and assessment and Plan:   Comfort measures only status: Patient has multiple severe chronic comorbidities as listed above, including stage IV bilateral cancer, liver cirrhosis, systolic CHF with EF [id number], atrial fibrillation on Eliquis, recent diagnosis of endocarditis and streptococcus pneumoniae bacteremia. Patient was admitted due to multiple acute complicated issues on 0000000, including altered mental status, severe sepsis with septic shock, GI bleeding, hyponatremia, worsening anemia, AKI, hypothermia.  Patient developed hypotension which has been persistent even after giving IV fluid and broad antibiotics of vancomycin and Zosyn in ED. Her prognosis is extremely poor. I had extensive discussion with the family in the presence of her power of attorney.  Patient's family members were very supportive. They agreed for comfort care, which was very reasonable given her progressive decline. Pt is DNR. Pt was admitted for comfort care. Started on prn LORazepam for agitation, morphine gtt, naphazoline 0.1 % ophthalmic  solution and scopolamine patch. Plan was to consult palliterative care team. Per report, pt expired at 22:40 on 10-22-2022. -Nurse may pronounce death   Other active issues are listed as below, stopped home medications, focused on comfort care.   Endocarditis   Severe sepsis with septic shock (HCC)   Cholecystitis   Iron deficiency anemia   GI bleeding   Blood loss anemia   Liver cirrhosis (HCC)   CAD (coronary artery disease)   Acute on chronic systolic CHF (congestive heart failure) (HCC)   Bladder cancer (HCC)   Hyponatremia   AKI (acute kidney injury) ([place])   Atrial fibrillation, chronic (HCC)   Acute metabolic encephalopathy   Alcohol use   Hypothermia   Acute blood loss anemia    Procedures:  none  Consultations:  none  The results of significant diagnostics from this hospitalization (including imaging, microbiology, ancillary and laboratory) are listed below for reference.   Significant Diagnostic Studies: DG Chest Port 1 View  Result Date: 10/22/22 CLINICAL DATA:  Questionable sepsis, evaluate for abnormality. EXAM: PORTABLE CHEST 1 VIEW COMPARISON:  10/11/2022. FINDINGS: Unchanged left upper extremity PICC with tip terminating over the right atrium. Low lung volumes accentuate the pulmonary vasculature and cardiomediastinal silhouette. Unchanged bibasilar atelectasis. No new airspace opacity. No pleural effusion or pneumothorax. Severe degenerative changes of the glenohumeral joints. IMPRESSION: Unchanged low lung volumes with bibasilar atelectasis. No acute findings. Electronically Signed   By: Emmit Alexanders M.D.   On: 2022-10-22 12:20   DG Chest Port 1 View  Result Date: 10/11/2022 CLINICAL DATA:  Status post PICC placement. EXAM: PORTABLE CHEST 1 VIEW COMPARISON:  October 04, 2022. FINDINGS: Stable  cardiomediastinal silhouette. Hypoinflation of the lungs is noted with minimal bibasilar subsegmental atelectasis. Interval placement of left-sided PICC line with distal  tip in expected position of the SVC. Degenerative changes seen involving the glenohumeral joints bilaterally. IMPRESSION: Hypoinflation of the lungs with minimal bibasilar subsegmental atelectasis. Interval placement of left-sided PICC line with distal tip in expected position of the SVC. Electronically Signed   By: Marijo Conception M.D.   On: 10/11/2022 11:42   [place] EKG SITE RITE  Result Date: 10/09/2022 If Site Rite image not attached, placement could not be confirmed due to current cardiac rhythm.  ECHOCARDIOGRAM COMPLETE  Result Date: 10/04/2022    ECHOCARDIOGRAM REPORT   Patient Name:   SIAHNA TODISCO Date of Exam: 10/04/2022 Medical Rec #:  [id number]        Height:       59.0 in Accession #:    [id number]       Weight:       120.1 lb Date of Birth:  1951-03-28         BSA:          1.485 m Patient Age:    72 years         BP:           131/120 mmHg Patient Gender: F                HR:           113 bpm. Exam Location:  [facility] Procedure: 2D Echo, Cardiac Doppler and Color Doppler Indications:     Bacteremia  History:         Patient has prior history of Echocardiogram examinations, most                  recent 08/10/2021. CAD and Previous Myocardial Infarction,                  Pulmonary HTN, Arrythmias:Atrial Fibrillation;                  Signs/Symptoms:Bacteremia and Chest Pain. Bladder CA.  Sonographer:     Wenda Low Referring Phys:  [id number] Charlesetta Ivory GONFA Diagnosing Phys: Kathlyn Sacramento MD IMPRESSIONS  1. Left ventricular ejection fraction, by estimation, is 45 to 50%. The left ventricle has mildly decreased function. Left ventricular endocardial border not optimally defined to evaluate regional wall motion. There is moderate left ventricular hypertrophy. Left ventricular diastolic parameters are indeterminate.  2. Right ventricular systolic function is normal. The right ventricular size is normal. There is moderately elevated pulmonary artery systolic pressure.  3. Left atrial size was severely  dilated.  4. Small vegetation on the mitral valve.  5. The mitral valve is abnormal. Moderate mitral valve regurgitation. No evidence of mitral stenosis. Severe mitral annular calcification.  6. Tricuspid valve regurgitation is moderate.  7. The aortic valve is normal in structure. Aortic valve regurgitation is trivial. Mild aortic valve stenosis. Aortic valve area, by VTI measures 1.43 cm. Aortic valve mean gradient measures 6.0 mmHg. Conclusion(s)/Recommendation(s): Findings concerning for mitral valve vegetation, would recommend a Transesophageal Echocardiogram for clarification. FINDINGS  Left Ventricle: Left ventricular ejection fraction, by estimation, is 45 to 50%. The left ventricle has mildly decreased function. Left ventricular endocardial border not optimally defined to evaluate regional wall motion. The left ventricular internal cavity size was normal in size. There is moderate left ventricular hypertrophy. Left ventricular diastolic parameters are indeterminate. Right Ventricle: The right ventricular size is normal. No increase  in right ventricular wall thickness. Right ventricular systolic function is normal. There is moderately elevated pulmonary artery systolic pressure. The tricuspid regurgitant velocity is 3.16 m/s, and with an assumed right atrial pressure of 8 mmHg, the estimated right ventricular systolic pressure is [id number] mmHg. Left Atrium: Left atrial size was severely dilated. Right Atrium: Right atrial size was normal in size. Pericardium: There is no evidence of pericardial effusion. Mitral Valve: The mitral valve is abnormal. There is moderate thickening of the mitral valve leaflet(s). There is moderate calcification of the mitral valve leaflet(s). Severe mitral annular calcification. Moderate mitral valve regurgitation. No evidence  of mitral valve stenosis. MV peak gradient, 16.2 mmHg. The mean mitral valve gradient is 7.0 mmHg. Tricuspid Valve: The tricuspid valve is normal in  structure. Tricuspid valve regurgitation is moderate . No evidence of tricuspid stenosis. Aortic Valve: The aortic valve is normal in structure. Aortic valve regurgitation is trivial. Mild aortic stenosis is present. Aortic valve mean gradient measures 6.0 mmHg. Aortic valve peak gradient measures 12.8 mmHg. Aortic valve area, by VTI measures  1.43 cm. Pulmonic Valve: The pulmonic valve was normal in structure. Pulmonic valve regurgitation is trivial. No evidence of pulmonic stenosis. Aorta: The aortic root is normal in size and structure. Venous: The inferior vena cava was not well visualized. IAS/Shunts: No atrial level shunt detected by color flow Doppler.  LEFT VENTRICLE PLAX 2D LVIDd:         3.50 cm LVIDs:         2.70 cm LV PW:         1.20 cm LV IVS:        1.30 cm LVOT diam:     1.90 cm LV SV:         39 LV SV Index:   27 LVOT Area:     2.84 cm  RIGHT VENTRICLE RV Basal diam:  3.20 cm RV Mid diam:    2.40 cm RV S prime:     13.50 cm/s LEFT ATRIUM             Index        RIGHT ATRIUM           Index LA diam:        5.00 cm 3.37 cm/m   RA Area:     14.50 cm LA Vol (A2C):   69.7 ml 46.93 ml/m  RA Volume:   37.90 ml  25.52 ml/m LA Vol (A4C):   86.6 ml 58.31 ml/m LA Biplane Vol: 79.4 ml 53.46 ml/m  AORTIC VALVE                     PULMONIC VALVE AV Area (Vmax):    1.71 cm      PV Vmax:       1.01 m/s AV Area (Vmean):   1.53 cm      PV Peak grad:  4.1 mmHg AV Area (VTI):     1.43 cm AV Vmax:           179.00 cm/s AV Vmean:          112.000 cm/s AV VTI:            0.275 m AV Peak Grad:      12.8 mmHg AV Mean Grad:      6.0 mmHg LVOT Vmax:         108.00 cm/s LVOT Vmean:        60.300 cm/s LVOT VTI:  0.139 m LVOT/AV VTI ratio: 0.51  AORTA Ao Root diam: 3.20 cm Ao Asc diam:  2.70 cm MITRAL VALVE                TRICUSPID VALVE MV Area (PHT): 2.91 cm     TR Peak grad:   39.9 mmHg MV Area VTI:   1.68 cm     TR Vmax:        316.00 cm/s MV Peak grad:  16.2 mmHg MV Mean grad:  7.0 mmHg     SHUNTS MV  Vmax:       2.01 m/s     Systemic VTI:  0.14 m MV Vmean:      116.0 cm/s   Systemic Diam: 1.90 cm MV Decel Time: 261 msec MV E velocity: 159.00 cm/s Kathlyn Sacramento MD Electronically signed by Kathlyn Sacramento MD Signature Date/Time: 10/04/2022/5:20:56 PM    Final    DG Chest Port 1 View  Result Date: 10/04/2022 CLINICAL DATA:  Shortness of breath. EXAM: PORTABLE CHEST 1 VIEW COMPARISON:  Chest radiograph and CT 10/02/2022 FINDINGS: The patient is rotated to the right. The cardiac silhouette is borderline enlarged. There is extensive mitral annular calcification. Lung volumes are low with mild interstitial densities in the lung bases. No overt pulmonary edema, lobar consolidation, sizeable pleural effusion, or pneumothorax is identified. Advanced bilateral glenohumeral arthropathy is again noted. IMPRESSION: Low lung volumes with mild bibasilar opacities which could reflect atelectasis or early infection. Electronically Signed   By: Logan Bores M.D.   On: 10/04/2022 15:15   MR ABDOMEN MRCP W WO CONTAST  Result Date: 10/03/2022 CLINICAL DATA:  72 year old female with suspected biliary obstruction. EXAM: MRI ABDOMEN WITHOUT AND WITH CONTRAST (INCLUDING MRCP) TECHNIQUE: Multiplanar multisequence MR imaging of the abdomen was performed both before and after the administration of intravenous contrast. Heavily T2-weighted images of the biliary and pancreatic ducts were obtained, and three-dimensional MRCP images were rendered by post processing. CONTRAST:  55m GADAVIST GADOBUTROL 1 MMOL/ML IV SOLN COMPARISON:  Abdominal MRI 04/11/2022. Right upper quadrant abdominal ultrasound 10/03/2022. CT of the abdomen and pelvis 10/02/2022. FINDINGS: Lower chest: Small right pleural effusion lying dependently. Hepatobiliary: Diffuse but heterogeneous loss of signal intensity throughout the hepatic parenchyma on out of phase dual echo images, indicative of a background of heterogeneous hepatic steatosis. Liver has a shrunken  appearance and very nodular contour, indicative of underlying cirrhosis. No definite suspicious cystic or solid hepatic lesions are confidently identified. No intra or extrahepatic biliary ductal dilatation noted on MRCP images. Common bile duct measures 6 mm in the porta hepatis. No filling defect within the common bile duct to suggest choledocholithiasis. However, there is heterogeneous signal intensity within the lumen of the gallbladder, most notable for a large signal void which measures up to 4.5 x 3.0 cm, compatible with a large stone. Gallbladder wall is irregularly thickened and edematous measuring up to 1 cm, with surrounding pericholecystic fluid and inflammatory changes. Pancreas: No pancreatic mass. No pancreatic ductal dilatation. No pancreatic or peripancreatic fluid collections or inflammatory changes. Spleen:  Unremarkable. Adrenals/Urinary Tract: Bilateral kidneys and bilateral adrenal glands are normal in appearance. No hydroureteronephrosis in the visualized portions of the abdomen. Stomach/Bowel: Visualized portions are unremarkable. Vascular/Lymphatic: No aneurysm identified in the visualized abdominal vasculature. No lymphadenopathy noted in the abdomen. Other: Trace volume of ascites. Inflammatory changes centered around the gallbladder in the right upper quadrant of the abdomen. Incidental imaging of the pelvis demonstrates an enlarged uterus with multiple lesions, presumably fibroids.  Musculoskeletal: No aggressive appearing osseous lesions are noted in the visualized portions of the skeleton. IMPRESSION: 1. Cholelithiasis with imaging findings highly concerning for acute cholecystitis. Surgical consultation is recommended. 2. No choledocholithiasis or findings of biliary tract obstruction. 3. Hepatic cirrhosis and heterogeneous hepatic steatosis. No aggressive appearing hepatic lesion noted at this time. 4. Trace volume of ascites. 5. Small right pleural effusion lying dependently.  Electronically Signed   By: Vinnie Langton M.D.   On: 10/03/2022 05:55   MR 3D Recon At Scanner  Result Date: 10/03/2022 CLINICAL DATA:  72 year old female with suspected biliary obstruction. EXAM: MRI ABDOMEN WITHOUT AND WITH CONTRAST (INCLUDING MRCP) TECHNIQUE: Multiplanar multisequence MR imaging of the abdomen was performed both before and after the administration of intravenous contrast. Heavily T2-weighted images of the biliary and pancreatic ducts were obtained, and three-dimensional MRCP images were rendered by post processing. CONTRAST:  34m GADAVIST GADOBUTROL 1 MMOL/ML IV SOLN COMPARISON:  Abdominal MRI 04/11/2022. Right upper quadrant abdominal ultrasound 10/03/2022. CT of the abdomen and pelvis 10/02/2022. FINDINGS: Lower chest: Small right pleural effusion lying dependently. Hepatobiliary: Diffuse but heterogeneous loss of signal intensity throughout the hepatic parenchyma on out of phase dual echo images, indicative of a background of heterogeneous hepatic steatosis. Liver has a shrunken appearance and very nodular contour, indicative of underlying cirrhosis. No definite suspicious cystic or solid hepatic lesions are confidently identified. No intra or extrahepatic biliary ductal dilatation noted on MRCP images. Common bile duct measures 6 mm in the porta hepatis. No filling defect within the common bile duct to suggest choledocholithiasis. However, there is heterogeneous signal intensity within the lumen of the gallbladder, most notable for a large signal void which measures up to 4.5 x 3.0 cm, compatible with a large stone. Gallbladder wall is irregularly thickened and edematous measuring up to 1 cm, with surrounding pericholecystic fluid and inflammatory changes. Pancreas: No pancreatic mass. No pancreatic ductal dilatation. No pancreatic or peripancreatic fluid collections or inflammatory changes. Spleen:  Unremarkable. Adrenals/Urinary Tract: Bilateral kidneys and bilateral adrenal glands  are normal in appearance. No hydroureteronephrosis in the visualized portions of the abdomen. Stomach/Bowel: Visualized portions are unremarkable. Vascular/Lymphatic: No aneurysm identified in the visualized abdominal vasculature. No lymphadenopathy noted in the abdomen. Other: Trace volume of ascites. Inflammatory changes centered around the gallbladder in the right upper quadrant of the abdomen. Incidental imaging of the pelvis demonstrates an enlarged uterus with multiple lesions, presumably fibroids. Musculoskeletal: No aggressive appearing osseous lesions are noted in the visualized portions of the skeleton. IMPRESSION: 1. Cholelithiasis with imaging findings highly concerning for acute cholecystitis. Surgical consultation is recommended. 2. No choledocholithiasis or findings of biliary tract obstruction. 3. Hepatic cirrhosis and heterogeneous hepatic steatosis. No aggressive appearing hepatic lesion noted at this time. 4. Trace volume of ascites. 5. Small right pleural effusion lying dependently. Electronically Signed   By: DVinnie LangtonM.D.   On: 10/03/2022 05:55   [place] LIMITED RUQ (LIVER/GB)  Result Date: 10/03/2022 CLINICAL DATA:  Abdominal pain. EXAM: ULTRASOUND ABDOMEN LIMITED RIGHT UPPER QUADRANT COMPARISON:  April 11, 2022 FINDINGS: Gallbladder: Large echogenic gallstones are seen within the lumen of a distended gallbladder (the largest measures approximately 3.0 cm). This is seen on the prior study. Stable gallbladder wall thickening (5.4 mm) and pericholecystic fluid are also noted. The presence or absence of a sonographic MPercell Millersign was not provided by the sonographer. Common bile duct: Diameter: 10.1 mm (measured 9.2 mm on the prior study) Liver: A 1.0 cm x 1.1 cm x 1.0  cm hypoechoic area is seen within the left lobe of the liver. No flow is noted within this region on color Doppler evaluation. The liver parenchyma is nodular in contour and diffusely increased in echogenicity.  Portal vein is patent on color Doppler imaging with normal direction of blood flow towards the liver. Other: There is a mild to moderate amount of perihepatic fluid. IMPRESSION: 1. Cholelithiasis and stable gallbladder wall thickening, which may be secondary to ascites. Sequelae associated with acute cholecystitis cannot be excluded. 2. Hepatic steatosis and hepatic cirrhosis with additional findings that may represent a hepatic cyst within the left lobe. 3. Ascites. Electronically Signed   By: Virgina Norfolk M.D.   On: 10/03/2022 00:08   CT CHEST ABDOMEN PELVIS W CONTRAST  Result Date: 10/02/2022 CLINICAL DATA:  Sepsis, abdominal pain out of proportion, lactic acidosis. Bladder cancer. EXAM: CT CHEST, ABDOMEN, AND PELVIS WITH CONTRAST TECHNIQUE: Multidetector CT imaging of the chest, abdomen and pelvis was performed following the standard protocol during bolus administration of intravenous contrast. RADIATION DOSE REDUCTION: This exam was performed according to the departmental dose-optimization program which includes automated exposure control, adjustment of the mA and/or kV according to patient size and/or use of iterative reconstruction technique. CONTRAST:  170m OMNIPAQUE IOHEXOL 300 MG/ML  SOLN COMPARISON:  PET CT 08/26/2022, CT abdomen pelvis 04/11/2022 FINDINGS: CT CHEST FINDINGS Cardiovascular: Mild coronary artery calcification. Extensive calcification of the mitral valve annulus. Global cardiac size within normal limits. No pericardial effusion. Central pulmonary arteries are enlarged in keeping with changes of pulmonary arterial hypertension. Moderate atherosclerotic calcification within the thoracic aorta. No aortic aneurysm. Mediastinum/Nodes: Pathologic right paratracheal lymph node demonstrating hypermetabolism on prior PET CT examination is stable measuring 15 mm in short axis diameter at axial image # 17/2. No new pathologic thoracic adenopathy. Esophagus unremarkable. Visualized thyroid  unremarkable. Lungs/Pleura: Interval development of small right pleural effusion. Interval development of trace interstitial pulmonary edema, possibly cardiogenic in nature. No pneumothorax. No central obstructing lesion. 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 Stable subacute fractures of the superior endplate of [id number] and the medial right eleventh and twelfth ribs proximal to the costotransverse junction demonstrating callus is again noted. No acute bone abnormality. No focal lytic lesion identified. CT ABDOMEN PELVIS FINDINGS Hepatobiliary: Cholelithiasis again noted. The gallbladder is distended and a gallstone is seen impacted within the gallbladder neck, similar to prior examination. There has, however, developed increasing pericholecystic infiltration which may relate to inflammatory changes the gallbladder, as can be seen with calculus cholecystitis, or progressive ascites. Cirrhosis. No enhancing intrahepatic mass. No intra or extrahepatic biliary ductal dilation. Pancreas: Unremarkable Spleen: Unremarkable Adrenals/Urinary Tract: The adrenal glands are unremarkable. The kidneys are normal in size and position. Mild left hydronephrosis and hydroureter to the level of the left ureterovesicular junction is stable since prior PET CT examination. No hydronephrosis on the right. No intrarenal or ureteral calculi. No enhancing intrarenal masses. The bladder is unremarkable. Stomach/Bowel: There is subtle asymmetric bowel wall thickening involving the hepatic flexure of the colon adjacent to the gallbladder suggesting an adjacent inflammatory process. The stomach, small bowel, and large bowel are otherwise unremarkable. Appendix normal. Mild ascites is present, new since prior PET CT examination. No free intraperitoneal gas. Vascular/Lymphatic: Extensive aortoiliac atherosclerotic calcification. No aortic aneurysm. Stable left periaortic  adenopathy since prior PET CT examination where this demonstrate no significant metabolic activity and likely represents the residua of treated disease. No new pathologic adenopathy within the abdomen and pelvis.  Reproductive: Multiple hypoenhancing masses are again seen within the uterus most in keeping with multiple uterine fibroids. The pelvic organs are otherwise unremarkable. Other: Increasing diffuse subcutaneous body wall edema in keeping with progressive anasarca. No abdominal wall hernia. Musculoskeletal: Degenerative changes are seen within the lumbar spine. No acute bone abnormality. No lytic bone lesion. IMPRESSION: 1. Interval development of small right pleural effusion, trace interstitial pulmonary edema, mild ascites, and progressive diffuse subcutaneous body wall edema in keeping with progressive anasarca and/or cardiogenic failure. 2. Cholelithiasis with gallstone impacted within the gallbladder neck. Interval development of pericholecystic infiltration which may relate to inflammatory changes the gallbladder, as can be seen with calculus cholecystitis, or progressive ascites. Subtle inflammatory change involving the adjacent hepatic flexure of the colon, however, suggests acute cholecystitis. Correlation with liver enzymes and possible right upper quadrant sonography may be helpful for further evaluation. 3. Cirrhosis. 4. Stable mild left hydronephrosis and hydroureter to the level of the left ureterovesicular junction, possibly related to the patient's known underlying bladder malignancy. No mass lesion identified, however. 5. Stable subacute fractures of the superior endplate of 624THL anteriorly and the medial right eleventh and twelfth ribs proximal to the costotransverse junction. No acute bone abnormality identified. 6. Mild coronary artery calcification. 7.  Aortic Atherosclerosis (ICD10-I70.0). Electronically Signed   By: Fidela Salisbury M.D.   On: 10/02/2022 21:53   DG Chest Port 1  View  Result Date: 10/02/2022 CLINICAL DATA:  Sepsis EXAM: PORTABLE CHEST 1 VIEW COMPARISON:  04/11/2022 FINDINGS: Lungs are well expanded, symmetric, and clear. No pneumothorax or pleural effusion. Cardiac size within normal limits. Extensive mitral valve annular calcifications again noted. Pulmonary vascularity is normal. Osseous structures are age-appropriate. Advanced degenerative changes noted within the shoulders bilaterally. No acute bone abnormality. IMPRESSION: 1. No active disease. Electronically Signed   By: Fidela Salisbury M.D.   On: 10/02/2022 19:38    Microbiology: Recent Results (from the past 240 hour(s))  Blood Culture (routine x 2)     Status: None (Preliminary result)   Collection Time: 11/04/2022 10:59 AM   Specimen: BLOOD  Result Value Ref Range Status   Specimen Description BLOOD BLOOD RIGHT FOREARM  Final   Special Requests   Final    BOTTLES DRAWN AEROBIC AND ANAEROBIC Blood Culture results may not be optimal due to an inadequate volume of blood received in culture bottles   Culture   Final    NO GROWTH < 24 HOURS Performed at [facility], [address], [place], [place] [address]    Report Status PENDING  Incomplete  Resp panel by RT-PCR (RSV, Flu A&B, Covid) Anterior Nasal Swab     Status: None   Collection Time: 11/07/2022 12:38 PM   Specimen: Anterior Nasal Swab  Result Value Ref Range Status   SARS Coronavirus 2 by RT PCR NEGATIVE NEGATIVE Final    Comment: (NOTE) SARS-CoV-2 target nucleic acids are NOT DETECTED.  The SARS-CoV-2 RNA is generally detectable in upper respiratory specimens during the acute phase of infection. The lowest concentration of SARS-CoV-2 viral copies this assay can detect is 138 copies/mL. A negative result does not preclude SARS-Cov-2 infection and should not be used as the sole basis for treatment or other patient management decisions. A negative result may occur with  improper specimen collection/handling, submission of  specimen other than nasopharyngeal swab, presence of viral mutation(s) within the areas targeted by this assay, and inadequate number of viral copies(<138 copies/mL). A negative result must be combined with clinical observations,  patient history, and epidemiological information. The expected result is Negative.  Fact Sheet for Patients:  EntrepreneurPulse.com.au  Fact Sheet for Healthcare Providers:  IncredibleEmployment.be  This test is no t yet approved or cleared by the [place] FDA and  has been authorized for detection and/or diagnosis of SARS-CoV-2 by FDA under an Emergency Use Authorization (EUA). This EUA will remain  in effect (meaning this test can be used) for the duration of the COVID-19 declaration under Section 564(b)(1) of the Act, 21 U.S.C.section 360bbb-3(b)(1), unless the authorization is terminated  or revoked sooner.       Influenza A by PCR NEGATIVE NEGATIVE Final   Influenza B by PCR NEGATIVE NEGATIVE Final    Comment: (NOTE) The Xpert Xpress SARS-CoV-2/FLU/RSV plus assay is intended as an aid in the diagnosis of influenza from Nasopharyngeal swab specimens and should not be used as a sole basis for treatment. Nasal washings and aspirates are unacceptable for Xpert Xpress SARS-CoV-2/FLU/RSV testing.  Fact Sheet for Patients: EntrepreneurPulse.com.au  Fact Sheet for Healthcare Providers: IncredibleEmployment.be  This test is not yet approved or cleared by the [place] FDA and has been authorized for detection and/or diagnosis of SARS-CoV-2 by FDA under an Emergency Use Authorization (EUA). This EUA will remain in effect (meaning this test can be used) for the duration of the COVID-19 declaration under Section 564(b)(1) of the Act, 21 U.S.C. section 360bbb-3(b)(1), unless the authorization is terminated or revoked.     Resp Syncytial Virus by PCR NEGATIVE NEGATIVE Final     Comment: (NOTE) Fact Sheet for Patients: EntrepreneurPulse.com.au  Fact Sheet for Healthcare Providers: IncredibleEmployment.be  This test is not yet approved or cleared by the [place] FDA and has been authorized for detection and/or diagnosis of SARS-CoV-2 by FDA under an Emergency Use Authorization (EUA). This EUA will remain in effect (meaning this test can be used) for the duration of the COVID-19 declaration under Section 564(b)(1) of the Act, 21 U.S.C. section 360bbb-3(b)(1), unless the authorization is terminated or revoked.  Performed at [facility], [address] [place],  [address]     Time spent: 25 minutes  Signed: Ivor Costa, MD 10/09/2022

## 2022-11-08 DEATH — deceased

## 2023-02-03 ENCOUNTER — Ambulatory Visit: Payer: Medicare Other | Admitting: Urology

## 2023-02-09 ENCOUNTER — Ambulatory Visit: Payer: Medicare Other | Admitting: Urology

## 2023-11-05 IMAGING — CT CT ABD-PELV W/ CM
2 of 5 series · 14 of 46 positions shown, 16 images · IV contrast (APPLIED)
Comparison: 12/17/2021

CLINICAL DATA: History of bladder cancer, status post TURP.
Nonlocalized abdominal pain after ERCP.

EXAM:
CT ABDOMEN AND PELVIS WITH CONTRAST
TECHNIQUE: Multidetector CT imaging of the abdomen and pelvis was performed
using the standard protocol following bolus administration of
intravenous contrast.

[Series 2: abdomen 5.0 · axial · 0.71mm/px · z∈[-978,-548]mm · 11 of 100 slices shown, 13 images]
[im 7/100  soft-tissue]
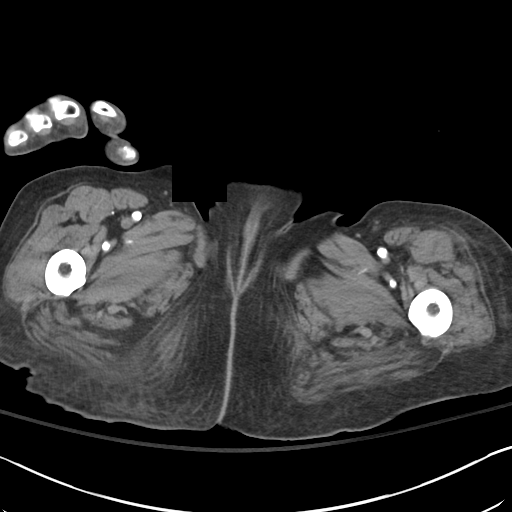
[im 7/100  bone]
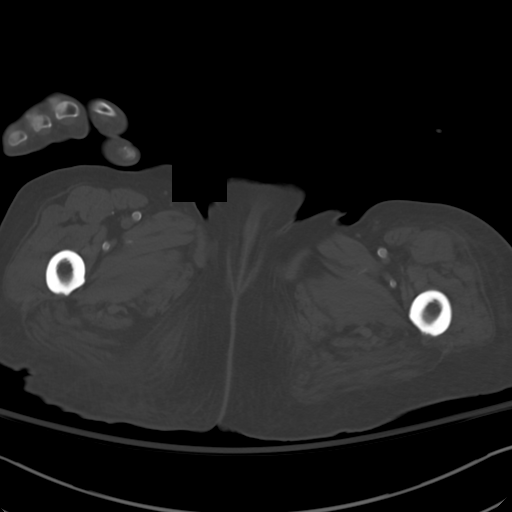
[im 19/100  soft-tissue]
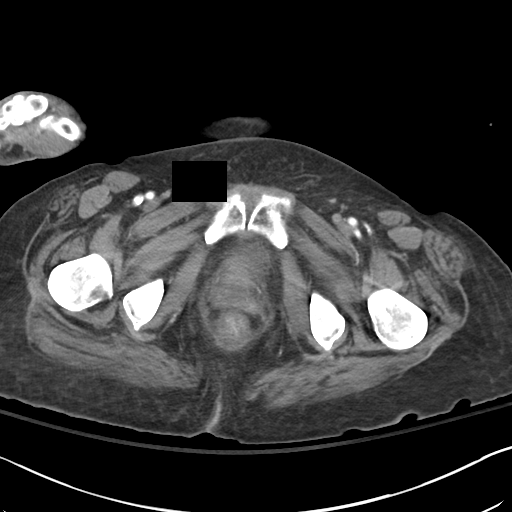
[im 25/100  soft-tissue]
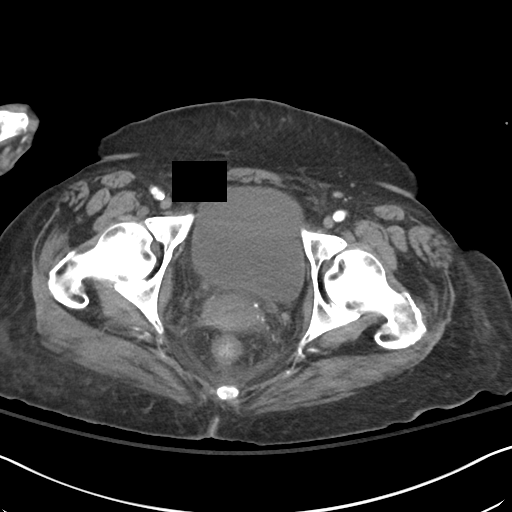
[im 31/100  soft-tissue]
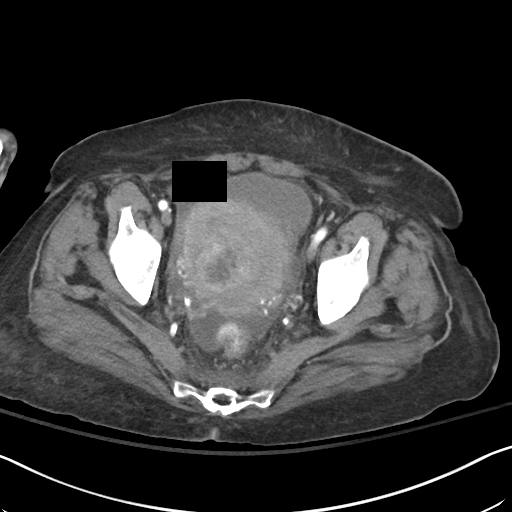
[im 44/100  soft-tissue]
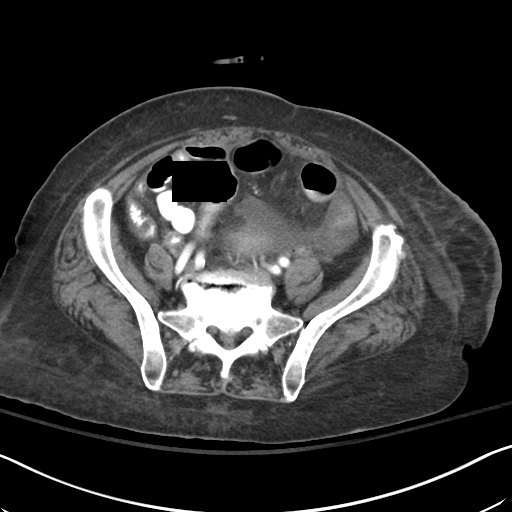
[im 50/100  soft-tissue]
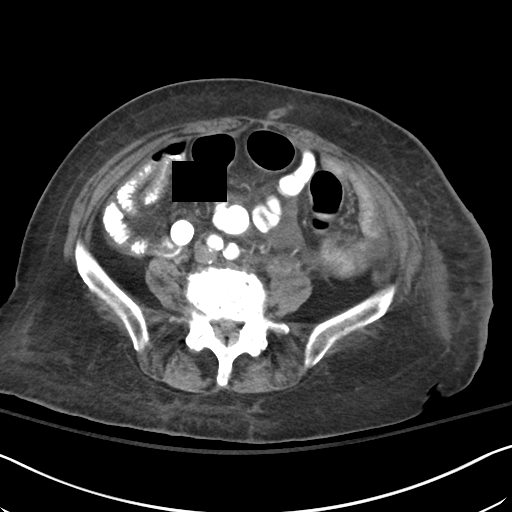
[im 56/100  soft-tissue]
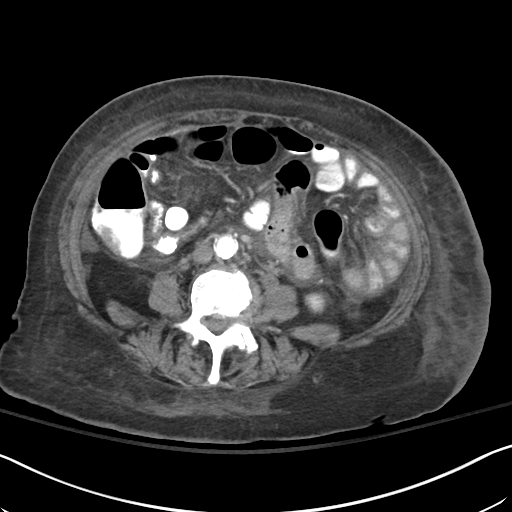
[im 69/100  soft-tissue]
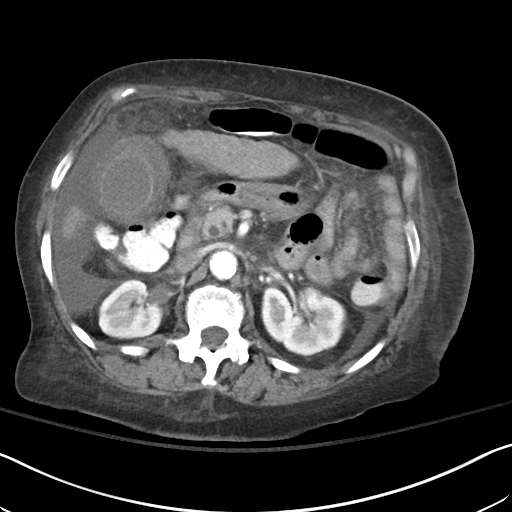
[im 75/100  soft-tissue]
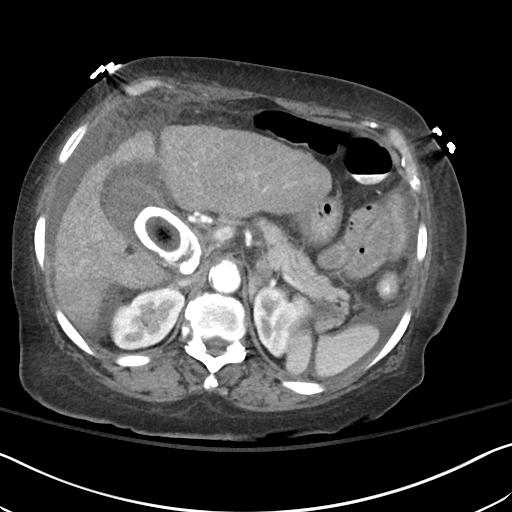
[im 75/100  bone]
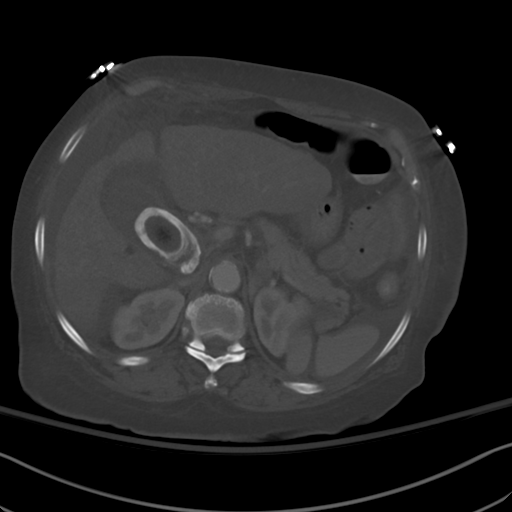
[im 81/100  soft-tissue]
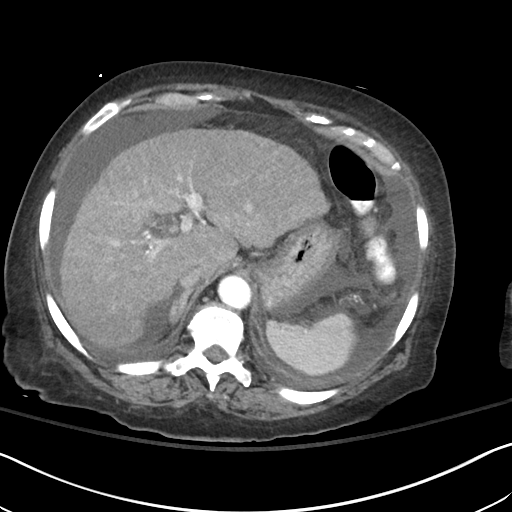
[im 93/100  soft-tissue]
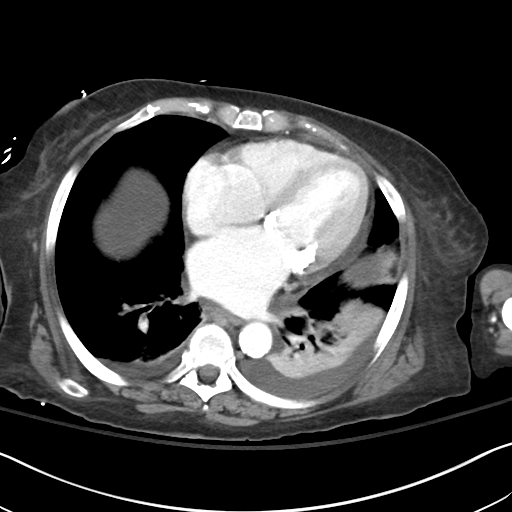

[Series 5: abdomen 3.0 mpr cor · coronal · 0.69mm/px · 3 of 94 slices shown]
[im 32/94  soft-tissue]
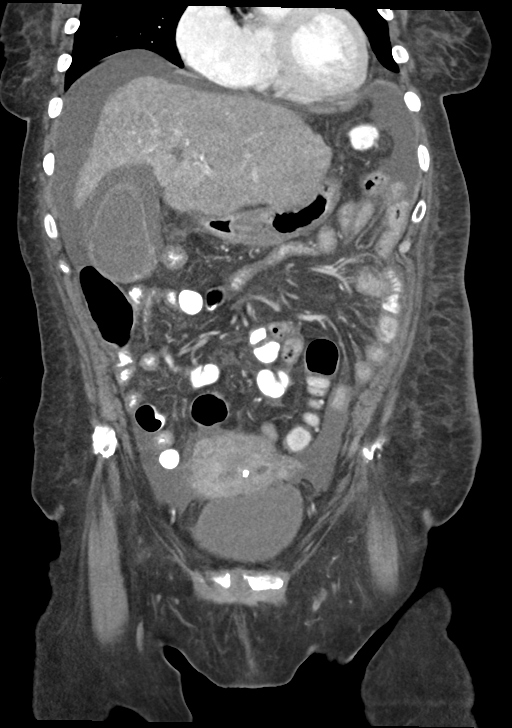
[im 42/94  soft-tissue]
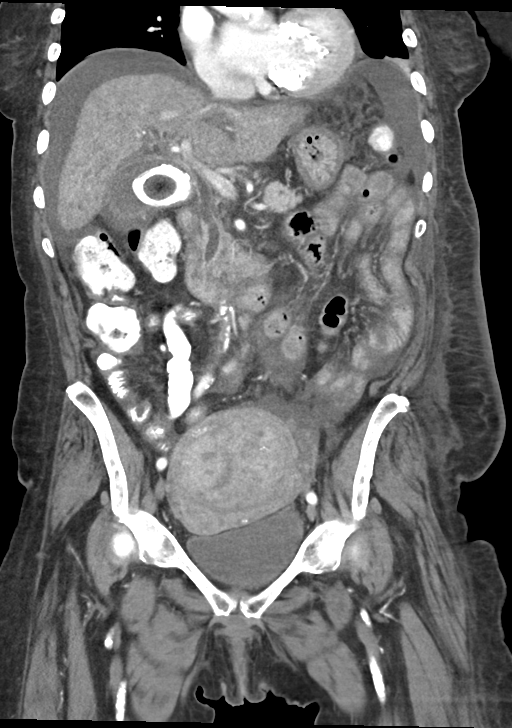
[im 52/94  soft-tissue]
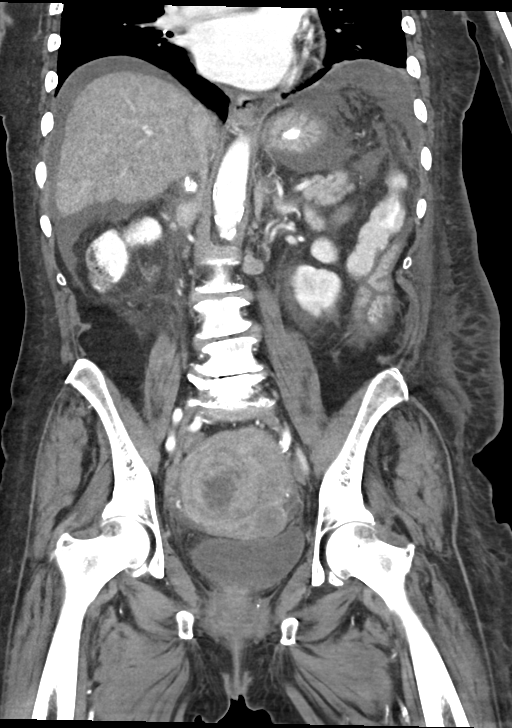

[14 of 46 positions shown; findings below may reference images not displayed]

RADIATION DOSE REDUCTION: This exam was performed according to the
departmental dose-optimization program which includes automated
exposure control, adjustment of the mA and/or kV according to
patient size and/or use of iterative reconstruction technique.

CONTRAST:  100mL OMNIPAQUE IOHEXOL 300 MG/ML  SOLN
FINDINGS: Lower chest: Bibasilar collapse/consolidation in the lower lobes,
left greater than right with small left and tiny right pleural
effusions.

Hepatobiliary: Nodular liver parenchyma and contour is compatible
with cirrhosis. Markedly large calcified stone is again identified
in the gallbladder with presumed layering stone in the fundus.
Gallbladder wall thickening is similar to prior. Common bile duct
measures upper normal at 6 mm in the head of the pancreas. The tiny
calcified stone seen previously in the distal common bile duct are
not evident today.

Pancreas: No focal mass lesion. No dilatation of the main duct. No
intraparenchymal cyst. No peripancreatic edema.

Spleen: No splenomegaly. No focal mass lesion.

Adrenals/Urinary Tract: No adrenal nodule or mass. Kidneys
unremarkable. No evidence for hydroureter. The urinary bladder
appears normal for the degree of distention.

Stomach/Bowel: Stomach is unremarkable. No gastric wall thickening.
No evidence of outlet obstruction. Duodenum is normally positioned
as is the ligament of Treitz. No small bowel wall thickening. No
small bowel dilatation. The terminal ileum is normal. The appendix
is not well visualized, but there is no edema or inflammation in the
region of the cecum. No gross colonic mass. No colonic wall
thickening.

Vascular/Lymphatic: There is moderate atherosclerotic calcification
of the abdominal aorta without aneurysm. 12 mm short axis
gastrohepatic ligament lymph node on [DATE] is similar to prior. 9 mm
short axis node adjacent to the left adrenal gland on [DATE] is
unchanged. Stable 10 mm short axis left para-aortic node on 37/2. No
pelvic sidewall lymphadenopathy.

Reproductive: Bulky uterine fibroid evident, as before. Other
scattered small fibroids noted. There is no adnexal mass.

Other: Small to moderate volume ascites is progressive in the
interval.

Musculoskeletal: Diffuse body wall edema is progressive since the
prior study. No worrisome lytic or sclerotic osseous abnormality.
Degenerative disc disease noted mid and lower lumbar spine.
IMPRESSION: 1. Mild gastrohepatic ligament lymphadenopathy and borderline
enlarged retroperitoneal lymph nodes are similar to 12/17/2021 but
new since PET-CT of 06/17/2021. Given the history of bladder cancer,
close follow-up recommended to exclude metastatic disease.
2. Interval progression of small to moderate volume ascites with
diffuse body wall edema.
3. Bibasilar collapse/consolidation, left greater than right with
small left and tiny right pleural effusions.
4. Cirrhotic liver morphology.
5. Cholelithiasis with similar appearance of gallbladder wall
thickening. Large calcified gallstone evident.
6. The tiny calcified stones seen previously in the distal common
bile duct are not evident today.
7. Bulky uterine fibroid disease, as before.
8. Aortic Atherosclerosis ([id number]).
